# Patient Record
Sex: Male | Born: 1980 | Race: White | Hispanic: No | Smoking: Current every day smoker
Health system: Southern US, Community
[De-identification: ages and names within clinical notes are randomized; demographics above are authoritative.]

## PROBLEM LIST (undated history)

## (undated) DIAGNOSIS — S43439A Superior glenoid labrum lesion of unspecified shoulder, initial encounter: Secondary | ICD-10-CM

## (undated) DIAGNOSIS — B20 Human immunodeficiency virus [HIV] disease: Secondary | ICD-10-CM

## (undated) DIAGNOSIS — G709 Myoneural disorder, unspecified: Secondary | ICD-10-CM

## (undated) DIAGNOSIS — M797 Fibromyalgia: Secondary | ICD-10-CM

## (undated) DIAGNOSIS — F419 Anxiety disorder, unspecified: Secondary | ICD-10-CM

## (undated) DIAGNOSIS — I10 Essential (primary) hypertension: Secondary | ICD-10-CM

## (undated) DIAGNOSIS — K219 Gastro-esophageal reflux disease without esophagitis: Secondary | ICD-10-CM

## (undated) DIAGNOSIS — I82409 Acute embolism and thrombosis of unspecified deep veins of unspecified lower extremity: Secondary | ICD-10-CM

## (undated) DIAGNOSIS — Z21 Asymptomatic human immunodeficiency virus [HIV] infection status: Secondary | ICD-10-CM

## (undated) DIAGNOSIS — I2699 Other pulmonary embolism without acute cor pulmonale: Secondary | ICD-10-CM

## (undated) DIAGNOSIS — E7800 Pure hypercholesterolemia, unspecified: Secondary | ICD-10-CM

## (undated) DIAGNOSIS — K449 Diaphragmatic hernia without obstruction or gangrene: Secondary | ICD-10-CM

## (undated) DIAGNOSIS — F32A Depression, unspecified: Secondary | ICD-10-CM

## (undated) HISTORY — PX: UPPER GI ENDOSCOPY: SHX6162

## (undated) HISTORY — PX: COLONOSCOPY: SHX174

## (undated) HISTORY — DX: Asymptomatic human immunodeficiency virus (hiv) infection status: Z21

## (undated) HISTORY — DX: Fibromyalgia: M79.7

## (undated) HISTORY — DX: Human immunodeficiency virus (HIV) disease: B20

---

## 1998-08-30 ENCOUNTER — Emergency Department (HOSPITAL_COMMUNITY): Admission: EM | Admit: 1998-08-30 | Discharge: 1998-08-30 | Payer: Self-pay | Admitting: Emergency Medicine

## 2005-02-11 ENCOUNTER — Emergency Department (HOSPITAL_COMMUNITY): Admission: EM | Admit: 2005-02-11 | Discharge: 2005-02-12 | Payer: Self-pay | Admitting: Emergency Medicine

## 2005-02-18 ENCOUNTER — Emergency Department (HOSPITAL_COMMUNITY): Admission: EM | Admit: 2005-02-18 | Discharge: 2005-02-18 | Payer: Self-pay | Admitting: Emergency Medicine

## 2010-08-22 ENCOUNTER — Emergency Department: Payer: Self-pay | Admitting: Unknown Physician Specialty

## 2010-08-24 ENCOUNTER — Inpatient Hospital Stay: Payer: Self-pay | Admitting: Psychiatry

## 2017-01-16 ENCOUNTER — Emergency Department (HOSPITAL_COMMUNITY)
Admission: EM | Admit: 2017-01-16 | Discharge: 2017-01-16 | Disposition: A | Discharge status: Home / Self Care | Payer: Self-pay | Attending: Emergency Medicine | Admitting: Emergency Medicine

## 2017-01-16 ENCOUNTER — Encounter (HOSPITAL_COMMUNITY): Payer: Self-pay | Admitting: *Deleted

## 2017-01-16 DIAGNOSIS — R1010 Upper abdominal pain, unspecified: Secondary | ICD-10-CM | POA: Insufficient documentation

## 2017-01-16 HISTORY — DX: Pure hypercholesterolemia, unspecified: E78.00

## 2017-01-16 HISTORY — DX: Gastro-esophageal reflux disease without esophagitis: K21.9

## 2017-01-16 HISTORY — DX: Diaphragmatic hernia without obstruction or gangrene: K44.9

## 2017-01-16 LAB — URINALYSIS, ROUTINE W REFLEX MICROSCOPIC
Bilirubin Urine: NEGATIVE
GLUCOSE, UA: NEGATIVE mg/dL
HGB URINE DIPSTICK: NEGATIVE
KETONES UR: NEGATIVE mg/dL
LEUKOCYTES UA: NEGATIVE
Nitrite: NEGATIVE
PROTEIN: NEGATIVE mg/dL
Specific Gravity, Urine: 1.015 (ref 1.005–1.030)
pH: 8 (ref 5.0–8.0)

## 2017-01-16 LAB — CBC
HCT: 45.3 % (ref 39.0–52.0)
Hemoglobin: 15.5 g/dL (ref 13.0–17.0)
MCH: 29 pg (ref 26.0–34.0)
MCHC: 34.2 g/dL (ref 30.0–36.0)
MCV: 84.8 fL (ref 78.0–100.0)
PLATELETS: 262 10*3/uL (ref 150–400)
RBC: 5.34 MIL/uL (ref 4.22–5.81)
RDW: 12.8 % (ref 11.5–15.5)
WBC: 10 10*3/uL (ref 4.0–10.5)

## 2017-01-16 LAB — COMPREHENSIVE METABOLIC PANEL
ALK PHOS: 52 U/L (ref 38–126)
ALT: 47 U/L (ref 17–63)
AST: 39 U/L (ref 15–41)
Albumin: 4 g/dL (ref 3.5–5.0)
Anion gap: 9 (ref 5–15)
BILIRUBIN TOTAL: 0.8 mg/dL (ref 0.3–1.2)
BUN: 11 mg/dL (ref 6–20)
CALCIUM: 9.8 mg/dL (ref 8.9–10.3)
CO2: 27 mmol/L (ref 22–32)
CREATININE: 1.23 mg/dL (ref 0.61–1.24)
Chloride: 105 mmol/L (ref 101–111)
Glucose, Bld: 82 mg/dL (ref 65–99)
Potassium: 4.1 mmol/L (ref 3.5–5.1)
Sodium: 141 mmol/L (ref 135–145)
Total Protein: 6.7 g/dL (ref 6.5–8.1)

## 2017-01-16 LAB — LIPASE, BLOOD: Lipase: 28 U/L (ref 11–51)

## 2017-01-16 MED ORDER — OMEPRAZOLE 20 MG PO CPDR: 20.0000 mg | DELAYED_RELEASE_CAPSULE | Freq: Every day | ORAL | 1 refills | Status: DC

## 2017-01-16 MED ORDER — GI COCKTAIL ~~LOC~~
30.0000 mL | Freq: Once | ORAL | Status: AC
  Administered 2017-01-16: 30 mL via ORAL
  Filled 2017-01-16: qty 30

## 2017-01-16 NOTE — ED Provider Notes (Signed)
[facility]EMERGENCY DEPT Provider Note   CSN: [id number] Arrival date & time: 01/16/17  1824     History   Chief Complaint Chief Complaint  Patient presents with  . Abdominal Pain    HPI Jimmy Fisher is a 36 y.o. male.  36 year old male with a history of esophageal reflux and dyslipidemia presents to the emergency department for complaints of upper abdominal pain. He reports a constant pain over the last 5 days which he describes as a dull, aching pain, "as though I were punched in the stomach". Patient denies taking any medication for symptoms, though he has taken Rolaids frequently secondary to history of esophageal reflux. He denies any nausea, vomiting, fever, diarrhea, melena, hematochezia. He further denies shortness of breath, leg swelling, diaphoresis. He has been having regular bowel movements and passing flatus regularly. No history of abdominal surgeries. He expresses concern over the chronicity of his symptoms.   The history is provided by the patient. No language interpreter was used.  Abdominal Pain      Past Medical History:  Diagnosis Date  . GERD (gastroesophageal reflux disease)   . Hiatal hernia   . Hypercholesteremia     There are no active problems to display for this patient.   History reviewed. No pertinent surgical history.     Home Medications    Prior to Admission medications   Medication Sig Start Date End Date Taking? Authorizing Provider  omeprazole (PRILOSEC) 20 MG capsule Take 1 capsule (20 mg total) by mouth daily. 01/16/17   Antony MaduraHumes, Jonothan Heberle, PA-C    Family History No family history on file.  Social History Social History  Substance Use Topics  . Smoking status: Never Smoker  . Smokeless tobacco: Never Used  . Alcohol use No     Allergies   Patient has no known allergies.   Review of Systems Review of Systems  Gastrointestinal: Positive for abdominal pain.  Ten systems reviewed and are negative for acute change, except as  noted in the HPI.    Physical Exam Updated Vital Signs BP (!) 146/106 (BP Location: Right Arm)   Pulse 85   Temp 98.1 F (36.7 C) (Oral)   Resp 14   SpO2 97%   Physical Exam  Constitutional: He is oriented to person, place, and time. He appears well-developed and well-nourished. No distress.  Nontoxic appearing and in no acute distress  HENT:  Head: Normocephalic and atraumatic.  Eyes: Conjunctivae and EOM are normal. No scleral icterus.  Neck: Normal range of motion.  Pulmonary/Chest: Effort normal. No respiratory distress. He has no wheezes.  Respirations even and unlabored  Abdominal:  Mild bilateral upper quadrant tenderness. Negative Murphy sign. No peritoneal signs or rigidity. No guarding.  Musculoskeletal: Normal range of motion.  Neurological: He is alert and oriented to person, place, and time. He exhibits normal muscle tone. Coordination normal.  Skin: Skin is warm and dry. No rash noted. He is not diaphoretic. No erythema. No pallor.  Psychiatric: He has a normal mood and affect. His behavior is normal.  Nursing note and vitals reviewed.    ED Treatments / Results  Labs (all labs ordered are listed, but only abnormal results are displayed) Labs Reviewed  URINALYSIS, ROUTINE W REFLEX MICROSCOPIC - Abnormal; Notable for the following:       Result Value   APPearance CLOUDY (*)    All other components within normal limits  LIPASE, BLOOD  COMPREHENSIVE METABOLIC PANEL  CBC    EKG  EKG Interpretation  Date/Time:  Sunday January 16 2017 19:01:46 EDT Ventricular Rate:  73 PR Interval:  174 QRS Duration: 104 QT Interval:  394 QTC Calculation: 434 R Axis:   -7 Text Interpretation:  Normal sinus rhythm Normal ECG normal EKG no priors  Confirmed by Liu, Dana ([id number]) on 01/16/2017 9:21:52 PM       Radiology No results found.  Procedures Procedures (including critical care time)  Medications Ordered in ED Medications  gi cocktail  (Maalox,Lidocaine,Donnatal) (30 mLs Oral Given 01/16/17 2107)     Initial Impression / Assessment and Plan / ED Course  I have reviewed the triage vital signs and the nursing notes.  Pertinent labs & imaging results that were available during my care of the patient were reviewed by me and considered in my medical decision making (see chart for details).     36  year old male presents for 5 days of upper abdominal discomfort without associated symptoms. He does have some reproducible tenderness on exam, but no peritoneal signs. His laboratory workup is reassuring. Patient is afebrile and without leukocytosis. He denies aggravation of his symptoms with eating, though he does have history of esophageal reflux and question underlying indigestion versus gastritis. Doubt SBO or pSBO given lack of nausea, vomiting in the presence of regular bowel movements and passing of flatus. No focal tenderness in the right upper quadrant. Negative Murphy sign. Also doubt acute cholecystitis. Symptoms atypical for bowel perforation and chronicity also makes this unlikely.  Low suspicion for emergent cause of symptoms. Plan for continued outpatient follow-up with gastroenterology. Referral given to [facility]. Resource guide also given for primary care services. Return precautions provided at discharge. Patient agreeable to plan with no unaddressed concerns; discharged in satisfactory condition.   Final Clinical Impressions(s) / ED Diagnoses   Final diagnoses:  Pain of upper abdomen    New Prescriptions Discharge Medication List as of 01/16/2017  9:25 PM    START taking these medications   Details  omeprazole (PRILOSEC) 20 MG capsule Take 1 capsule (20 mg total) by mouth daily., Starting Sun 01/16/2017, Print         Antony MaduraHumes, Rayner Erman, PA-C 01/16/17 2243    Lavera GuiseLiu, Dana Duo, MD 01/16/17 [phone]

## 2017-01-16 NOTE — ED Provider Notes (Signed)
Medical screening examination/treatment/procedure(s) were conducted as a shared visit with non-physician practitioner(s) and myself.  I personally evaluated the patient during the encounter.   EKG Interpretation  Date/Time:  Sunday January 16 2017 19:01:46 EDT Ventricular Rate:  73 PR Interval:  174 QRS Duration: 104 QT Interval:  394 QTC Calculation: 434 R Axis:   -7 Text Interpretation:  Normal sinus rhythm Normal ECG normal EKG no priors  Confirmed by Arvella Massingale ([id number]) on 01/16/2017 9:21:52 PM      36  year old male who presents with epigastric pain and abdominal fullness. History of GERD, hiatal hernia. States 5 days constant epigastric discomfort. No n/v, melena, hematochezia. No associated with eating. No fever or chills. No urinary complaints.   Well appearing. Vitals stable. Abdomen soft and benign. Mild epigastric discomfort. No Murphy's sign. Normal lipase, CBC, CMP. UA unremarkable. Do not suspect serious intraabdominal process. May be indigestion/gastritis. Do not think imaging indicated at this time. Referral to GI given. Strict return and follow-up instructions reviewed. He expressed understanding of all discharge instructions and felt comfortable with the plan of care.    Lavera GuiseLiu, Zanyia Silbaugh Duo, MD 01/16/17 [phone]

## 2017-01-16 NOTE — ED Triage Notes (Signed)
To ED for eval of mid-epigastric for past 5 days. No radiation. No vomiting. Pt has hx of GERD but states this pain is different.

## 2017-01-19 ENCOUNTER — Emergency Department (HOSPITAL_COMMUNITY)
Admission: EM | Admit: 2017-01-19 | Discharge: 2017-01-19 | Disposition: A | Discharge status: Home / Self Care | Payer: Self-pay | Attending: Emergency Medicine | Admitting: Emergency Medicine

## 2017-01-19 ENCOUNTER — Encounter (HOSPITAL_COMMUNITY): Payer: Self-pay | Admitting: *Deleted

## 2017-01-19 DIAGNOSIS — K279 Peptic ulcer, site unspecified, unspecified as acute or chronic, without hemorrhage or perforation: Secondary | ICD-10-CM

## 2017-01-19 LAB — COMPREHENSIVE METABOLIC PANEL
ALT: 52 U/L (ref 17–63)
AST: 41 U/L (ref 15–41)
Albumin: 3.6 g/dL (ref 3.5–5.0)
Alkaline Phosphatase: 54 U/L (ref 38–126)
Anion gap: 10 (ref 5–15)
BILIRUBIN TOTAL: 0.9 mg/dL (ref 0.3–1.2)
BUN: 7 mg/dL (ref 6–20)
CHLORIDE: 106 mmol/L (ref 101–111)
CO2: 22 mmol/L (ref 22–32)
CREATININE: 1.02 mg/dL (ref 0.61–1.24)
Calcium: 8.7 mg/dL — ABNORMAL LOW (ref 8.9–10.3)
Glucose, Bld: 136 mg/dL — ABNORMAL HIGH (ref 65–99)
POTASSIUM: 3.8 mmol/L (ref 3.5–5.1)
Sodium: 138 mmol/L (ref 135–145)
TOTAL PROTEIN: 6.5 g/dL (ref 6.5–8.1)

## 2017-01-19 LAB — CBC
HCT: 41.9 % (ref 39.0–52.0)
Hemoglobin: 14.5 g/dL (ref 13.0–17.0)
MCH: 29.4 pg (ref 26.0–34.0)
MCHC: 34.6 g/dL (ref 30.0–36.0)
MCV: 84.8 fL (ref 78.0–100.0)
PLATELETS: 244 10*3/uL (ref 150–400)
RBC: 4.94 MIL/uL (ref 4.22–5.81)
RDW: 12.8 % (ref 11.5–15.5)
WBC: 9.4 10*3/uL (ref 4.0–10.5)

## 2017-01-19 LAB — URINALYSIS, ROUTINE W REFLEX MICROSCOPIC
BILIRUBIN URINE: NEGATIVE
Bacteria, UA: NONE SEEN
GLUCOSE, UA: 150 mg/dL — AB
KETONES UR: NEGATIVE mg/dL
LEUKOCYTES UA: NEGATIVE
NITRITE: NEGATIVE
PH: 5 (ref 5.0–8.0)
Protein, ur: NEGATIVE mg/dL
SPECIFIC GRAVITY, URINE: 1.025 (ref 1.005–1.030)

## 2017-01-19 LAB — LIPASE, BLOOD: LIPASE: 28 U/L (ref 11–51)

## 2017-01-19 MED ORDER — HYDROCODONE-ACETAMINOPHEN 5-325 MG PO TABS
1.0000 | ORAL_TABLET | ORAL | 0 refills | Status: DC | PRN
Start: 2017-01-19 — End: 2019-01-13

## 2017-01-19 MED ORDER — SUCRALFATE 1 G PO TABS: 1.0000 g | ORAL_TABLET | Freq: Four times a day (QID) | ORAL | 0 refills | Status: DC

## 2017-01-19 MED ORDER — OMEPRAZOLE 20 MG PO CPDR: 40.0000 mg | DELAYED_RELEASE_CAPSULE | Freq: Two times a day (BID) | ORAL | 0 refills | Status: DC

## 2017-01-19 MED ORDER — SUCRALFATE 1 G PO TABS
1.0000 g | ORAL_TABLET | Freq: Once | ORAL | Status: AC
  Administered 2017-01-19: 1 g via ORAL
  Filled 2017-01-19: qty 1

## 2017-01-19 MED ORDER — PANTOPRAZOLE SODIUM 40 MG PO TBEC
40.0000 mg | DELAYED_RELEASE_TABLET | Freq: Once | ORAL | Status: AC
  Administered 2017-01-19: 40 mg via ORAL
  Filled 2017-01-19: qty 1

## 2017-01-19 NOTE — ED Provider Notes (Signed)
Pt seen and evaluated. Patient seen and discussed with Dr. Alinda MoneyMelvin.  Patient with fairly classic symptoms likely related to acid related phenomenon with ulcer or gastritis. He has early satiety, nocturnal pain, and pain after by mouth intake. Use ibuprofen 800 up to several days per week but not daily. No alcohol or tobacco. A boxer steroids. Daily caffeine. Cautioned against all of these. No stigmata of bleeding. Had emesis within the last 24 hours and did not see blood. He is taking Prilosec but only 20 daily. Labs are reassuring and symptoms and exam did not suggest biliary tract etiology. Plan 40 twice a day Prilosec. Carafate 1 g 3 times a day. Vicodin sparingly for the next several days this is still expressing regular pain. Liquid antacid at night. GI follow-up. Have asked case manager see him regarding possible [facility].   Rolland PorterJames, Bailynn Dyk, MD 01/19/17 1005

## 2017-01-19 NOTE — ED Notes (Signed)
Family at bedside. 

## 2017-01-19 NOTE — Discharge Instructions (Signed)
Thank you for allowing us to care for you.  Please increase you dose of Omeprazole to 40mg  Twice a day with meals. We have added: - Sucralfate 1 gram Twice daily, on empty stomach. - Pain medication to take as needed for 3 days  For nighttime reflux symptoms, please use a liquid OTC antacid.  For Peptic ulcers, seek medication attention if: You have sudden, sharp pain in your belly (abdomen). You have lasting belly pain. You have bloody poop (stool) or black, tarry poop. You throw up (vomit) blood. It may look like coffee grounds. You feel light-headed or feel like you may pass out (faint). You get weak. You get sweaty or feel sticky and cold to the touch (clammy).

## 2017-01-19 NOTE — ED Provider Notes (Signed)
[facility] DEPT Provider Note   CSN: [id number] Arrival date & time: 01/19/17  0106     History   Chief Complaint Chief Complaint  Patient presents with  . Abdominal Pain    HPI Jimmy Fisher is a 36 y.o. male.  Patient with a history of GERD, Hiatal hernia, HLD who was recently seen in the ED on 01/16/17 with bilat upper quadrant abdominal pain discharged with Rx for PPI present today with worsening of his previous epigastric pain. The pain has been as bad a 8/10, waxes and wanes, but is constant and radiates from his epigastrum down his midline 6 or so inches at times. The pain worsens 30-45 minutes following eating. It is also worsened by movement, deep inspiration, cough and leaning forward. The pain has been associated with feelings of early satiety and "spasm" in his epigastrium with some burping. Patient states he takes OTC antiacids constantly for his chronic heartburn. Patient endorse 1 episode of vomiting. He denies blood or brown color in his vomit and denies bloody or tarry stool.  Of note, patient states that he has a history of kidney stone and this feels completely different.        Past Medical History:  Diagnosis Date  . GERD (gastroesophageal reflux disease)   . Hiatal hernia   . Hypercholesteremia     There are no active problems to display for this patient.   History reviewed. No pertinent surgical history.     Home Medications    Prior to Admission medications   Medication Sig Start Date End Date Taking? Authorizing Provider  HYDROcodone-acetaminophen (NORCO/VICODIN) 5-325 MG tablet Take 1 tablet by mouth every 4 (four) hours as needed. 01/19/17   Rolland PorterJames, Mark, MD  omeprazole (PRILOSEC) 20 MG capsule Take 2 capsules (40 mg total) by mouth 2 (two) times daily. 01/19/17   Rolland PorterJames, Mark, MD  sucralfate (CARAFATE) 1 g tablet Take 1 tablet (1 g total) by mouth 4 (four) times daily. 01/19/17   Rolland PorterJames, Mark, MD    Family History No family history on  file.  Social History Social History  Substance Use Topics  . Smoking status: Never Smoker  . Smokeless tobacco: Never Used  . Alcohol use No     Allergies   Patient has no known allergies.   Review of Systems Review of Systems  Constitutional: Negative for chills and fever.  HENT: Negative for ear pain and sore throat.   Eyes: Negative for pain and visual disturbance.  Respiratory: Negative for cough and shortness of breath.   Cardiovascular: Negative for chest pain and palpitations.  Gastrointestinal: Negative for abdominal pain and vomiting.  Genitourinary: Negative for dysuria and hematuria.  Musculoskeletal: Negative for arthralgias and back pain.  Skin: Negative for color change and rash.  Neurological: Negative for seizures and syncope.  All other systems reviewed and are negative.    Physical Exam Updated Vital Signs BP (!) 131/97   Pulse 66   Temp 98.5 F (36.9 C) (Oral)   Resp 12   SpO2 98%   Physical Exam  Constitutional: He is oriented to person, place, and time. He appears well-developed and well-nourished.  HENT:  Head: Normocephalic and atraumatic.  Eyes: Conjunctivae are normal.  Neck: Neck supple.  Cardiovascular: Normal rate and regular rhythm.   No murmur heard. Pulmonary/Chest: Effort normal and breath sounds normal. No respiratory distress.  Abdominal: Soft. There is tenderness.  Tender to palpation at epigastrum Negative murphy's sign  Musculoskeletal: He exhibits no edema  or deformity.  Neurological: He is alert and oriented to person, place, and time.  Skin: Skin is warm and dry.  Psychiatric: He has a normal mood and affect.  Nursing note and vitals reviewed.    ED Treatments / Results  Labs (all labs ordered are listed, but only abnormal results are displayed) Labs Reviewed  COMPREHENSIVE METABOLIC PANEL - Abnormal; Notable for the following:       Result Value   Glucose, Bld 136 (*)    Calcium 8.7 (*)    All other  components within normal limits  URINALYSIS, ROUTINE W REFLEX MICROSCOPIC - Abnormal; Notable for the following:    Glucose, UA 150 (*)    Hgb urine dipstick LARGE (*)    Squamous Epithelial / LPF 0-5 (*)    All other components within normal limits  LIPASE, BLOOD  CBC    EKG  EKG Interpretation None       Radiology No results found.  Procedures Procedures (including critical care time)  Medications Ordered in ED Medications  pantoprazole (PROTONIX) EC tablet 40 mg (40 mg Oral Given 01/19/17 1040)  sucralfate (CARAFATE) tablet 1 g (1 g Oral Given 01/19/17 1040)     Initial Impression / Assessment and Plan / ED Course  I have reviewed the triage vital signs and the nursing notes.  Pertinent labs & imaging results that were available during my care of the patient were reviewed by me and considered in my medical decision making (see chart for details).    36 year old male presenting with increasing epigastric pain. Recently discharge from ED on 01/16/17 with suspected GERD on PPI with GI follow up. - CBC: WNL - BMP: Ca 8.7, Glucose 136; No significant derangements - Lipase: WNL - U/A: shows too numerous to count RBC.  Worsening epigastric pain on low dose PPI likely represents gastric ulcer in the setting of typical symptoms and laboratory results reassuring that the cause is ulikely to be of pancreatic or gall bladder origin. Blood on U/A may represent transient ureteral obstruction by known kidney stones.  - Will give dose of PPI, and Carafate here - Will discharge with Rx for Omeprazole 40mg  BID, Sucralfate 1g BID on empty stomach, and Pain medication  - Have advised patient to take OTC liquid antiacids at night PRN  Final Clinical Impressions(s) / ED Diagnoses   Final diagnoses:  Peptic ulcer    New Prescriptions Discharge Medication List as of 01/19/2017 10:32 AM    START taking these medications   Details  HYDROcodone-acetaminophen (NORCO/VICODIN) 5-325 MG tablet  Take 1 tablet by mouth every 4 (four) hours as needed., Starting Wed 01/19/2017, Print    sucralfate (CARAFATE) 1 g tablet Take 1 tablet (1 g total) by mouth 4 (four) times daily., Starting Wed 01/19/2017, Print         Beola Cord, MD 01/19/17 1113    Rolland Porter, MD 01/20/17 [phone]

## 2017-01-19 NOTE — Discharge Planning (Signed)
Joshlynn Alfonzo J. Lucretia RoersWood, RN, BSN, [place] [phone]  [facility] Sindy Messingoger Gomez[facility] @ [phone].  Spoke with pt at bedside and advised to please arrive 15 min early and take a picture ID and your current medications.  Pt verbalizes understanding of keeping appointment.

## 2017-01-19 NOTE — ED Triage Notes (Signed)
Pt c/o epigastric pain x1 week. Was seen on Sunday and referred to a GI specialist. Has been taking antiacids without relief. Reports pain feels "like someone punching me in the stomach"

## 2017-02-16 ENCOUNTER — Inpatient Hospital Stay (INDEPENDENT_AMBULATORY_CARE_PROVIDER_SITE_OTHER): Payer: Self-pay | Admitting: Physician Assistant

## 2018-03-14 ENCOUNTER — Emergency Department (HOSPITAL_BASED_OUTPATIENT_CLINIC_OR_DEPARTMENT_OTHER): Payer: Self-pay

## 2018-03-14 ENCOUNTER — Emergency Department (HOSPITAL_BASED_OUTPATIENT_CLINIC_OR_DEPARTMENT_OTHER)
Admission: EM | Admit: 2018-03-14 | Discharge: 2018-03-14 | Disposition: A | Discharge status: Home / Self Care | Payer: Self-pay | Attending: Emergency Medicine | Admitting: Emergency Medicine

## 2018-03-14 ENCOUNTER — Encounter (HOSPITAL_BASED_OUTPATIENT_CLINIC_OR_DEPARTMENT_OTHER): Payer: Self-pay

## 2018-03-14 ENCOUNTER — Other Ambulatory Visit: Payer: Self-pay

## 2018-03-14 DIAGNOSIS — S63642A Sprain of metacarpophalangeal joint of left thumb, initial encounter: Secondary | ICD-10-CM | POA: Insufficient documentation

## 2018-03-14 DIAGNOSIS — Y929 Unspecified place or not applicable: Secondary | ICD-10-CM | POA: Insufficient documentation

## 2018-03-14 DIAGNOSIS — Z79899 Other long term (current) drug therapy: Secondary | ICD-10-CM | POA: Insufficient documentation

## 2018-03-14 DIAGNOSIS — Y93H3 Activity, building and construction: Secondary | ICD-10-CM | POA: Insufficient documentation

## 2018-03-14 DIAGNOSIS — Y990 Civilian activity done for income or pay: Secondary | ICD-10-CM | POA: Insufficient documentation

## 2018-03-14 DIAGNOSIS — W228XXA Striking against or struck by other objects, initial encounter: Secondary | ICD-10-CM | POA: Insufficient documentation

## 2018-03-14 MED ORDER — IBUPROFEN 600 MG PO TABS: 600.0000 mg | ORAL_TABLET | Freq: Four times a day (QID) | ORAL | 0 refills | Status: DC | PRN

## 2018-03-14 MED ORDER — IBUPROFEN 800 MG PO TABS
800.0000 mg | ORAL_TABLET | Freq: Once | ORAL | Status: AC
  Administered 2018-03-14: 800 mg via ORAL
  Filled 2018-03-14: qty 1

## 2018-03-14 MED FILL — IBUPROFEN 600 MG TABLET: 600 | 7 days supply | Qty: 30 | Fill #0

## 2018-03-14 NOTE — ED Provider Notes (Signed)
[facility] EMERGENCY DEPARTMENT Provider Note   CSN: [id number] Arrival date & time: 03/14/18  1304     History   Chief Complaint Chief Complaint  Patient presents with  . Finger Injury    HPI Jimmy Fisher is a 37 y.o. male.  The history is provided by the patient. No language interpreter was used.     37 year old male presenting for evaluation of left thumb injury.  Patient report while at work yesterday he was working on some scaffolding when a board fell and made contact with his left thumb.  He report acute onset of sharp pain to the base of the thumb follows with numbness.  Numbness has since resolved however he is experiencing persistent throbbing 8 out of 10 pain to the base of his thumb.  He endorsed increasing pain with movement.  He is right-hand dominant.  He denies any other injury.  He did try icing the affected area.  Past Medical History:  Diagnosis Date  . GERD (gastroesophageal reflux disease)   . Hiatal hernia   . Hypercholesteremia     There are no active problems to display for this patient.   History reviewed. No pertinent surgical history.      Home Medications    Prior to Admission medications   Medication Sig Start Date End Date Taking? Authorizing Provider  HYDROcodone-acetaminophen (NORCO/VICODIN) 5-325 MG tablet Take 1 tablet by mouth every 4 (four) hours as needed. 01/19/17   Rolland Porter, MD  omeprazole (PRILOSEC) 20 MG capsule Take 2 capsules (40 mg total) by mouth 2 (two) times daily. 01/19/17   Rolland Porter, MD  sucralfate (CARAFATE) 1 g tablet Take 1 tablet (1 g total) by mouth 4 (four) times daily. 01/19/17   Rolland Porter, MD    Family History No family history on file.  Social History Social History   Tobacco Use  . Smoking status: Never Smoker  . Smokeless tobacco: Never Used  Substance Use Topics  . Alcohol use: No  . Drug use: No     Allergies   Patient has no known allergies.   Review of Systems Review  of Systems  Constitutional: Negative for fever.  Musculoskeletal: Positive for arthralgias.  Skin: Negative for wound.  Neurological: Negative for numbness.     Physical Exam Updated Vital Signs BP 133/76 (BP Location: Right Arm)   Pulse 74   Temp 98.4 F (36.9 C) (Oral)   Resp 18   Ht 5\' 6"  (1.676 m)   Wt 87.1 kg   SpO2 98%   BMI 30.99 kg/m   Physical Exam  Constitutional: He appears well-developed and well-nourished. No distress.  HENT:  Head: Atraumatic.  Eyes: Conjunctivae are normal.  Neck: Neck supple.  Musculoskeletal: He exhibits tenderness (Left thumb: Moderate tenderness and swelling noted to the base of the thumb at the metacarpal region as well as there anatomical snuffbox.  No obvious deformity no crepitus.  Brisk cap refill to the thumb, normal flexion extension at the IP joint.).  Neurological: He is alert.  Skin: No rash noted.  Psychiatric: He has a normal mood and affect.  Nursing note and vitals reviewed.    ED Treatments / Results  Labs (all labs ordered are listed, but only abnormal results are displayed) Labs Reviewed - No data to display  EKG None  Radiology Dg Finger Thumb Left  Result Date: 03/14/2018 CLINICAL DATA:  Crush injury last week.  Thumb pain EXAM: LEFT THUMB 2+V COMPARISON:  None. FINDINGS:  There is no evidence of fracture or dislocation. There is no evidence of arthropathy or other focal bone abnormality. Soft tissues are unremarkable IMPRESSION: Negative. Electronically Signed   By: Marlan Palau M.D.   On: 03/14/2018 13:30    Procedures Procedures (including critical care time)  Medications Ordered in ED Medications  ibuprofen (ADVIL,MOTRIN) tablet 800 mg (has no administration in time range)     Initial Impression / Assessment and Plan / ED Course  I have reviewed the triage vital signs and the nursing notes.  Pertinent labs & imaging results that were available during my care of the patient were reviewed by me and  considered in my medical decision making (see chart for details).     BP 133/76 (BP Location: Right Arm)   Pulse 74   Temp 98.4 F (36.9 C) (Oral)   Resp 18   Ht 5\' 6"  (1.676 m)   Wt 87.1 kg   SpO2 98%   BMI 30.99 kg/m    Final Clinical Impressions(s) / ED Diagnoses   Final diagnoses:  Sprain of metacarpophalangeal (MCP) joint of left thumb, initial encounter    ED Discharge Orders         Ordered    ibuprofen (ADVIL,MOTRIN) 600 MG tablet  Every 6 hours PRN     03/14/18 1339         1:37 PM Pt report a scaffold board fell and made contact with his L thumb yesterday.  He has pain at the base of the thumb, including the anatomical snuff box.  Initial xray is negative.  However, due to potential scaphoid injury or significant ligamentous injury, will place hand in a thumb spica splint and encourage pt to f/u with hand specialist for further care.     Fayrene Helper, PA-C 03/14/18 1340    Pricilla Loveless, MD 03/14/18 1515

## 2018-03-14 NOTE — ED Triage Notes (Signed)
Pt injured left thumb yesterday-NAD-steady gait

## 2018-03-14 NOTE — Discharge Instructions (Signed)
Please wear splint for support.  Call and follow up with hand specialist if you notice no improvement in 1 week.

## 2018-07-17 ENCOUNTER — Other Ambulatory Visit: Payer: Self-pay

## 2018-07-17 ENCOUNTER — Emergency Department (HOSPITAL_BASED_OUTPATIENT_CLINIC_OR_DEPARTMENT_OTHER): Payer: Self-pay

## 2018-07-17 ENCOUNTER — Encounter (HOSPITAL_BASED_OUTPATIENT_CLINIC_OR_DEPARTMENT_OTHER): Payer: Self-pay

## 2018-07-17 ENCOUNTER — Emergency Department (HOSPITAL_BASED_OUTPATIENT_CLINIC_OR_DEPARTMENT_OTHER)
Admission: EM | Admit: 2018-07-17 | Discharge: 2018-07-17 | Disposition: A | Discharge status: Home / Self Care | Payer: Self-pay | Attending: Emergency Medicine | Admitting: Emergency Medicine

## 2018-07-17 DIAGNOSIS — N200 Calculus of kidney: Secondary | ICD-10-CM | POA: Insufficient documentation

## 2018-07-17 DIAGNOSIS — Z79899 Other long term (current) drug therapy: Secondary | ICD-10-CM | POA: Insufficient documentation

## 2018-07-17 HISTORY — DX: Acute embolism and thrombosis of unspecified deep veins of unspecified lower extremity: I82.409

## 2018-07-17 HISTORY — DX: Other pulmonary embolism without acute cor pulmonale: I26.99

## 2018-07-17 LAB — CBC WITH DIFFERENTIAL/PLATELET
Abs Immature Granulocytes: 0.03 10*3/uL (ref 0.00–0.07)
Basophils Absolute: 0.1 10*3/uL (ref 0.0–0.1)
Basophils Relative: 1 %
EOS PCT: 5 %
Eosinophils Absolute: 0.4 10*3/uL (ref 0.0–0.5)
HCT: 46.9 % (ref 39.0–52.0)
Hemoglobin: 15.3 g/dL (ref 13.0–17.0)
Immature Granulocytes: 0 %
Lymphocytes Relative: 35 %
Lymphs Abs: 3 10*3/uL (ref 0.7–4.0)
MCH: 28.5 pg (ref 26.0–34.0)
MCHC: 32.6 g/dL (ref 30.0–36.0)
MCV: 87.3 fL (ref 80.0–100.0)
MONOS PCT: 9 %
Monocytes Absolute: 0.8 10*3/uL (ref 0.1–1.0)
Neutro Abs: 4.4 10*3/uL (ref 1.7–7.7)
Neutrophils Relative %: 50 %
Platelets: 242 10*3/uL (ref 150–400)
RBC: 5.37 MIL/uL (ref 4.22–5.81)
RDW: 12.6 % (ref 11.5–15.5)
WBC: 8.7 10*3/uL (ref 4.0–10.5)
nRBC: 0 % (ref 0.0–0.2)

## 2018-07-17 LAB — URINALYSIS, ROUTINE W REFLEX MICROSCOPIC
BILIRUBIN URINE: NEGATIVE
Glucose, UA: NEGATIVE mg/dL
Hgb urine dipstick: NEGATIVE
Ketones, ur: NEGATIVE mg/dL
LEUKOCYTES UA: NEGATIVE
NITRITE: NEGATIVE
Protein, ur: NEGATIVE mg/dL
Specific Gravity, Urine: 1.02 (ref 1.005–1.030)
pH: 7 (ref 5.0–8.0)

## 2018-07-17 LAB — BASIC METABOLIC PANEL
Anion gap: 8 (ref 5–15)
BUN: 15 mg/dL (ref 6–20)
CO2: 26 mmol/L (ref 22–32)
Calcium: 9.7 mg/dL (ref 8.9–10.3)
Chloride: 104 mmol/L (ref 98–111)
Creatinine, Ser: 1 mg/dL (ref 0.61–1.24)
GFR calc Af Amer: >60 mL/min (ref >60)
Glucose, Bld: 88 mg/dL (ref 70–99)
Potassium: 4 mmol/L (ref 3.5–5.1)
Sodium: 138 mmol/L (ref 135–145)

## 2018-07-17 MED ORDER — NAPROXEN 500 MG PO TABS: 500.0000 mg | ORAL_TABLET | Freq: Two times a day (BID) | ORAL | 0 refills | Status: DC

## 2018-07-17 MED ORDER — FENTANYL CITRATE (PF) 100 MCG/2ML IJ SOLN
50.0000 ug | Freq: Once | INTRAMUSCULAR | Status: AC
  Administered 2018-07-17: 50 ug via INTRAVENOUS
  Filled 2018-07-17: qty 2

## 2018-07-17 MED ORDER — KETOROLAC TROMETHAMINE 30 MG/ML IJ SOLN
30.0000 mg | Freq: Once | INTRAMUSCULAR | Status: AC
  Administered 2018-07-17: 30 mg via INTRAVENOUS
  Filled 2018-07-17: qty 1

## 2018-07-17 MED ORDER — METHOCARBAMOL 500 MG PO TABS: 500.0000 mg | ORAL_TABLET | Freq: Two times a day (BID) | ORAL | 0 refills | Status: DC

## 2018-07-17 MED ORDER — SODIUM CHLORIDE 0.9 % IV BOLUS
1000.0000 mL | Freq: Once | INTRAVENOUS | Status: AC
  Administered 2018-07-17: 1000 mL via INTRAVENOUS

## 2018-07-17 NOTE — Discharge Instructions (Addendum)
Return to the ED if you start to develop worsening symptoms, fever, pain with urination, vomiting or coughing up blood.

## 2018-07-17 NOTE — ED Triage Notes (Signed)
Pt c/o left side flank pain for the last few hours, states it feels similar to other kidney stones, denies nausea, denies dysuria

## 2018-07-17 NOTE — ED Provider Notes (Signed)
[facility] EMERGENCY DEPARTMENT Provider Note   CSN: [id number] Arrival date & time: 07/17/18  1736     History   Chief Complaint Chief Complaint  Patient presents with  . Flank Pain    HPI Jimmy Fisher is a 38 y.o. male who presents to ED for left-sided flank pain for the past 4 hours.  States that the pain is sharp, intermittent and feels like "a spasm."  He reports history of similar symptoms with his prior kidney stones but does note that each kidney stone has somewhat different characteristics of pain.  His last kidney stone was several years ago.  He had an episode about a month ago where he had dysuria for which she took leftover antibiotics and Azo for.  This resolved his symptoms.  States his prior kidney stones have passed without complications.  He does not have a urologist.  Denies any fever, abdominal pain, vomiting, changes to bowel movements, testicular pain.  HPI  Past Medical History:  Diagnosis Date  . DVT (deep venous thrombosis) (HCC)   . GERD (gastroesophageal reflux disease)   . Hiatal hernia   . Hypercholesteremia   . Pulmonary embolism (HCC)     There are no active problems to display for this patient.   History reviewed. No pertinent surgical history.      Home Medications    Prior to Admission medications   Medication Sig Start Date End Date Taking? Authorizing Provider  HYDROcodone-acetaminophen (NORCO/VICODIN) 5-325 MG tablet Take 1 tablet by mouth every 4 (four) hours as needed. 01/19/17   Rolland PorterJames, Mark, MD  ibuprofen (ADVIL,MOTRIN) 600 MG tablet Take 1 tablet (600 mg total) by mouth every 6 (six) hours as needed. 03/14/18   Fayrene Helperran, Bowie, PA-C  methocarbamol (ROBAXIN) 500 MG tablet Take 1 tablet (500 mg total) by mouth 2 (two) times daily. 07/17/18   Helyn Schwan, PA-C  naproxen (NAPROSYN) 500 MG tablet Take 1 tablet (500 mg total) by mouth 2 (two) times daily. 07/17/18   Klynn Linnemann, PA-C  omeprazole (PRILOSEC) 20 MG capsule Take 2  capsules (40 mg total) by mouth 2 (two) times daily. 01/19/17   Rolland PorterJames, Mark, MD  sucralfate (CARAFATE) 1 g tablet Take 1 tablet (1 g total) by mouth 4 (four) times daily. 01/19/17   Rolland PorterJames, Mark, MD    Family History No family history on file.  Social History Social History   Tobacco Use  . Smoking status: Never Smoker  . Smokeless tobacco: Never Used  Substance Use Topics  . Alcohol use: No  . Drug use: No     Allergies   Patient has no known allergies.   Review of Systems Review of Systems  Constitutional: Negative for appetite change, chills and fever.  HENT: Negative for ear pain, rhinorrhea, sneezing and sore throat.   Eyes: Negative for photophobia and visual disturbance.  Respiratory: Negative for cough, chest tightness, shortness of breath and wheezing.   Cardiovascular: Negative for chest pain and palpitations.  Gastrointestinal: Negative for abdominal pain, blood in stool, constipation, diarrhea, nausea and vomiting.  Genitourinary: Positive for flank pain. Negative for dysuria, hematuria and urgency.  Musculoskeletal: Negative for myalgias.  Skin: Negative for rash.  Neurological: Negative for dizziness, weakness and light-headedness.     Physical Exam Updated Vital Signs BP (!) 130/100 (BP Location: Left Arm)   Pulse 92   Temp 97.7 F (36.5 C) (Oral)   Resp 18   Ht 5\' 6"  (1.676 m)   Wt 86.2 kg  SpO2 98%   BMI 30.67 kg/m   Physical Exam Vitals signs and nursing note reviewed.  Constitutional:      General: He is not in acute distress.    Appearance: He is well-developed.  HENT:     Head: Normocephalic and atraumatic.     Nose: Nose normal.  Eyes:     General: No scleral icterus.       Left eye: No discharge.     Conjunctiva/sclera: Conjunctivae normal.  Neck:     Musculoskeletal: Normal range of motion and neck supple.  Cardiovascular:     Rate and Rhythm: Normal rate and regular rhythm.     Heart sounds: Normal heart sounds. No murmur. No  friction rub. No gallop.   Pulmonary:     Effort: Pulmonary effort is normal. No respiratory distress.     Breath sounds: Normal breath sounds.  Abdominal:     General: Bowel sounds are normal. There is no distension.     Palpations: Abdomen is soft.     Tenderness: There is abdominal tenderness (L flank). There is no guarding.  Musculoskeletal: Normal range of motion.  Skin:    General: Skin is warm and dry.     Findings: No rash.  Neurological:     Mental Status: He is alert.     Motor: No abnormal muscle tone.     Coordination: Coordination normal.      ED Treatments / Results  Labs (all labs ordered are listed, but only abnormal results are displayed) Labs Reviewed  URINALYSIS, ROUTINE W REFLEX MICROSCOPIC  BASIC METABOLIC PANEL  CBC WITH DIFFERENTIAL/PLATELET    EKG None  Radiology Ct Renal Stone Study  Result Date: 07/17/2018 CLINICAL DATA:  Left flank pain for 2 hours. History of kidney stones. EXAM: CT ABDOMEN AND PELVIS WITHOUT CONTRAST TECHNIQUE: Multidetector CT imaging of the abdomen and pelvis was performed following the standard protocol without IV contrast. COMPARISON:  02/11/2005 FINDINGS: Lower chest: Lung bases are clear. Hepatobiliary: No focal liver abnormality is seen. No gallstones, gallbladder wall thickening, or biliary dilatation. Pancreas: Unremarkable. No pancreatic ductal dilatation or surrounding inflammatory changes. Spleen: Normal in size without focal abnormality. Adrenals/Urinary Tract: No adrenal gland nodules. Kidneys are symmetrical in size and shape. No hydronephrosis or hydroureter. Tiny punctate sub mm sized stones suggested in both kidneys. No ureteral or bladder stones. Bladder wall is mildly thickened, possibly indicating cystitis. Stomach/Bowel: Stomach, small bowel, and colon are mostly decompressed. No wall thickening or inflammatory changes are seen. Scattered stool throughout the colon. Appendix is normal. Vascular/Lymphatic: No  significant vascular findings are present. No enlarged abdominal or pelvic lymph nodes. Reproductive: Calcification of the prostate gland without enlargement. Other: No free air or free fluid in the abdomen. Abdominal wall musculature appears intact. Musculoskeletal: No acute or significant osseous findings. IMPRESSION: Tiny punctate nonobstructing stones in both kidneys. No ureteral stone or obstruction. Bladder wall is mildly thickened, possibly indicating cystitis. Electronically Signed   By: Burman Nieves M.D.   On: 07/17/2018 19:10    Procedures Procedures (including critical care time)  Medications Ordered in ED Medications  sodium chloride 0.9 % bolus 1,000 mL ( Intravenous Stopped 07/17/18 2011)  fentaNYL (SUBLIMAZE) injection 50 mcg (50 mcg Intravenous Given 07/17/18 1852)  ketorolac (TORADOL) 30 MG/ML injection 30 mg (30 mg Intravenous Given 07/17/18 1930)     Initial Impression / Assessment and Plan / ED Course  I have reviewed the triage vital signs and the nursing notes.  Pertinent labs &  imaging results that were available during my care of the patient were reviewed by me and considered in my medical decision making (see chart for details).     38 year old male presents to ED for several hour history of left-sided flank pain.  States it feels similar to prior kidney stones.  Denies any urinary symptoms currently although did have what he thought was a UTI about a month ago.  Vital signs within normal limits.  On my exam there is tenderness palpation of the left flank and left lower back.  No abdominal tenderness palpation.  Lab work including urinalysis, CBC, BMP unremarkable.  CT renal stone study shows several punctuate nonobstructing stones.  I suspect that possibly musculoskeletal pain as his kidney stones do not appear to be the cause of the pain.  He is comfortable with medications given here.  We will have him take anti-inflammatories, muscle relaxer and PCP follow-up.  Advised  to return to ED for any severe worsening symptoms.  Patient is hemodynamically stable, in NAD, and able to ambulate in the ED. Evaluation does not show pathology that would require ongoing emergent intervention or inpatient treatment. I explained the diagnosis to the patient. Pain has been managed and has no complaints prior to discharge. Patient is comfortable with above plan and is stable for discharge at this time. All questions were answered prior to disposition. Strict return precautions for returning to the ED were discussed. Encouraged follow up with PCP.    Portions of this note were generated with Scientist, clinical (histocompatibility and immunogenetics). Dictation errors may occur despite best attempts at proofreading.   Final Clinical Impressions(s) / ED Diagnoses   Final diagnoses:  Nephrolithiasis    ED Discharge Orders         Ordered    naproxen (NAPROSYN) 500 MG tablet  2 times daily     07/17/18 2050    methocarbamol (ROBAXIN) 500 MG tablet  2 times daily     07/17/18 2050           Dietrich Pates, PA-C 07/17/18 2052    Mesner, Barbara Cower, MD 07/18/18 [phone]

## 2018-07-17 NOTE — ED Notes (Signed)
Pt to CT via Rad Tech 

## 2019-01-13 ENCOUNTER — Emergency Department (HOSPITAL_BASED_OUTPATIENT_CLINIC_OR_DEPARTMENT_OTHER)
Admission: EM | Admit: 2019-01-13 | Discharge: 2019-01-13 | Disposition: A | Discharge status: Home / Self Care | Payer: Self-pay | Attending: Emergency Medicine | Admitting: Emergency Medicine

## 2019-01-13 ENCOUNTER — Other Ambulatory Visit: Payer: Self-pay

## 2019-01-13 ENCOUNTER — Encounter (HOSPITAL_BASED_OUTPATIENT_CLINIC_OR_DEPARTMENT_OTHER): Payer: Self-pay | Admitting: Emergency Medicine

## 2019-01-13 DIAGNOSIS — R110 Nausea: Secondary | ICD-10-CM | POA: Insufficient documentation

## 2019-01-13 DIAGNOSIS — T304 Corrosion of unspecified body region, unspecified degree: Secondary | ICD-10-CM

## 2019-01-13 DIAGNOSIS — Z86711 Personal history of pulmonary embolism: Secondary | ICD-10-CM | POA: Insufficient documentation

## 2019-01-13 DIAGNOSIS — R51 Headache: Secondary | ICD-10-CM | POA: Insufficient documentation

## 2019-01-13 DIAGNOSIS — T578X1A Toxic effect of other specified inorganic substances, accidental (unintentional), initial encounter: Secondary | ICD-10-CM | POA: Insufficient documentation

## 2019-01-13 DIAGNOSIS — Z86718 Personal history of other venous thrombosis and embolism: Secondary | ICD-10-CM | POA: Insufficient documentation

## 2019-01-13 DIAGNOSIS — E785 Hyperlipidemia, unspecified: Secondary | ICD-10-CM | POA: Insufficient documentation

## 2019-01-13 DIAGNOSIS — T7840XA Allergy, unspecified, initial encounter: Secondary | ICD-10-CM

## 2019-01-13 MED ORDER — DIPHENHYDRAMINE HCL 50 MG/ML IJ SOLN
50.0000 mg | Freq: Once | INTRAMUSCULAR | Status: AC
  Administered 2019-01-13: 50 mg via INTRAVENOUS
  Filled 2019-01-13: qty 1

## 2019-01-13 MED ORDER — ONDANSETRON HCL 4 MG/2ML IJ SOLN
4.0000 mg | Freq: Once | INTRAMUSCULAR | Status: AC
  Administered 2019-01-13: 4 mg via INTRAVENOUS

## 2019-01-13 MED ORDER — FAMOTIDINE IN NACL 20-0.9 MG/50ML-% IV SOLN
20.0000 mg | Freq: Once | INTRAVENOUS | Status: AC
  Administered 2019-01-13: 20 mg via INTRAVENOUS
  Filled 2019-01-13: qty 50

## 2019-01-13 MED ORDER — EPINEPHRINE 0.3 MG/0.3ML IJ SOAJ: 0.3000 mg | INTRAMUSCULAR | 0 refills | Status: DC | PRN

## 2019-01-13 MED ORDER — PREDNISONE 20 MG PO TABS: ORAL_TABLET | ORAL | 0 refills | Status: DC

## 2019-01-13 MED ORDER — METHYLPREDNISOLONE SODIUM SUCC 125 MG IJ SOLR
125.0000 mg | Freq: Once | INTRAMUSCULAR | Status: AC
  Administered 2019-01-13: 125 mg via INTRAVENOUS
  Filled 2019-01-13: qty 2

## 2019-01-13 MED ORDER — PROCHLORPERAZINE EDISYLATE 10 MG/2ML IJ SOLN
10.0000 mg | Freq: Once | INTRAMUSCULAR | Status: AC
  Administered 2019-01-13: 10 mg via INTRAVENOUS
  Filled 2019-01-13: qty 2

## 2019-01-13 MED ORDER — BACITRACIN ZINC 500 UNIT/GM EX OINT: 1.0000 "application " | TOPICAL_OINTMENT | Freq: Two times a day (BID) | CUTANEOUS | 0 refills | Status: AC

## 2019-01-13 MED ORDER — EPINEPHRINE 0.3 MG/0.3ML IJ SOAJ
0.3000 mg | Freq: Once | INTRAMUSCULAR | Status: AC
  Administered 2019-01-13: 0.3 mg via INTRAMUSCULAR
  Filled 2019-01-13: qty 0.3

## 2019-01-13 MED ORDER — ONDANSETRON HCL 4 MG/2ML IJ SOLN
INTRAMUSCULAR | Status: AC
  Administered 2019-01-13: 4 mg
  Filled 2019-01-13: qty 2
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
## 2019-01-13 NOTE — ED Triage Notes (Signed)
Hives and swelling to face. He states his throat tight. This started last night.

## 2019-01-13 NOTE — ED Notes (Signed)
ED Provider at bedside. Dr. Goldston 
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
## 2019-02-04 ENCOUNTER — Encounter (HOSPITAL_COMMUNITY): Payer: Self-pay | Admitting: Emergency Medicine

## 2019-02-04 ENCOUNTER — Emergency Department (HOSPITAL_COMMUNITY): Payer: Self-pay

## 2019-02-04 ENCOUNTER — Emergency Department (HOSPITAL_COMMUNITY)
Admission: EM | Admit: 2019-02-04 | Discharge: 2019-02-04 | Disposition: A | Discharge status: Home / Self Care | Payer: Self-pay | Attending: Emergency Medicine | Admitting: Emergency Medicine

## 2019-02-04 ENCOUNTER — Other Ambulatory Visit: Payer: Self-pay

## 2019-02-04 DIAGNOSIS — Z79899 Other long term (current) drug therapy: Secondary | ICD-10-CM | POA: Insufficient documentation

## 2019-02-04 DIAGNOSIS — R1011 Right upper quadrant pain: Secondary | ICD-10-CM

## 2019-02-04 DIAGNOSIS — R1013 Epigastric pain: Secondary | ICD-10-CM | POA: Insufficient documentation

## 2019-02-04 DIAGNOSIS — R0789 Other chest pain: Secondary | ICD-10-CM | POA: Insufficient documentation

## 2019-02-04 LAB — COMPREHENSIVE METABOLIC PANEL
ALT: 33 U/L (ref 0–44)
AST: 35 U/L (ref 15–41)
Albumin: 3.8 g/dL (ref 3.5–5.0)
Alkaline Phosphatase: 65 U/L (ref 38–126)
Anion gap: 13 (ref 5–15)
BUN: 11 mg/dL (ref 6–20)
CO2: 22 mmol/L (ref 22–32)
Calcium: 8.6 mg/dL — ABNORMAL LOW (ref 8.9–10.3)
Chloride: 101 mmol/L (ref 98–111)
Creatinine, Ser: 1.28 mg/dL — ABNORMAL HIGH (ref 0.61–1.24)
GFR calc Af Amer: >60 mL/min (ref >60)
GFR calc non Af Amer: >60 mL/min (ref >60)
Glucose, Bld: 86 mg/dL (ref 70–99)
Potassium: 3.7 mmol/L (ref 3.5–5.1)
Sodium: 136 mmol/L (ref 135–145)
Total Bilirubin: 0.9 mg/dL (ref 0.3–1.2)
Total Protein: 6.5 g/dL (ref 6.5–8.1)

## 2019-02-04 LAB — URINALYSIS, ROUTINE W REFLEX MICROSCOPIC
Bilirubin Urine: NEGATIVE
Glucose, UA: NEGATIVE mg/dL
Hgb urine dipstick: NEGATIVE
Ketones, ur: 5 mg/dL — AB
Leukocytes,Ua: NEGATIVE
Nitrite: NEGATIVE
Protein, ur: NEGATIVE mg/dL
Specific Gravity, Urine: 1.029 (ref 1.005–1.030)
pH: 5 (ref 5.0–8.0)

## 2019-02-04 LAB — LIPASE, BLOOD: Lipase: 34 U/L (ref 11–51)

## 2019-02-04 LAB — CBC
HCT: 47.3 % (ref 39.0–52.0)
Hemoglobin: 15.7 g/dL (ref 13.0–17.0)
MCH: 29.1 pg (ref 26.0–34.0)
MCHC: 33.2 g/dL (ref 30.0–36.0)
MCV: 87.8 fL (ref 80.0–100.0)
Platelets: 230 10*3/uL (ref 150–400)
RBC: 5.39 MIL/uL (ref 4.22–5.81)
RDW: 12.6 % (ref 11.5–15.5)
WBC: 4.5 10*3/uL (ref 4.0–10.5)
nRBC: 0 % (ref 0.0–0.2)

## 2019-02-04 MED ORDER — FAMOTIDINE 20 MG PO TABS: 20.0000 mg | ORAL_TABLET | Freq: Two times a day (BID) | ORAL | 0 refills | Status: DC

## 2019-02-04 MED ORDER — ALUM & MAG HYDROXIDE-SIMETH 200-200-20 MG/5ML PO SUSP
30.0000 mL | Freq: Once | ORAL | Status: AC
  Administered 2019-02-04: 19:00:00 30 mL via ORAL
  Filled 2019-02-04: qty 30

## 2019-02-04 MED ORDER — SUCRALFATE 1 G PO TABS
1.0000 g | ORAL_TABLET | Freq: Once | ORAL | Status: AC
  Administered 2019-02-04: 19:00:00 1 g via ORAL
  Filled 2019-02-04: qty 1

## 2019-02-04 MED ORDER — SUCRALFATE 1 G PO TABS: 1.0000 g | ORAL_TABLET | Freq: Three times a day (TID) | ORAL | 0 refills | Status: DC

## 2019-02-04 MED ORDER — SODIUM CHLORIDE 0.9% FLUSH
3.0000 mL | Freq: Once | INTRAVENOUS | Status: AC
  Administered 2019-02-04: 19:00:00 3 mL via INTRAVENOUS

## 2019-02-04 MED ORDER — MORPHINE SULFATE (PF) 4 MG/ML IV SOLN
4.0000 mg | Freq: Once | INTRAVENOUS | Status: AC
  Administered 2019-02-04: 19:00:00 4 mg via INTRAVENOUS
  Filled 2019-02-04: qty 1

## 2019-02-04 MED ORDER — PANTOPRAZOLE SODIUM 40 MG IV SOLR
40.0000 mg | Freq: Once | INTRAVENOUS | Status: AC
  Administered 2019-02-04: 19:00:00 40 mg via INTRAVENOUS
  Filled 2019-02-04: qty 40

## 2019-02-04 MED ORDER — FAMOTIDINE IN NACL 20-0.9 MG/50ML-% IV SOLN
20.0000 mg | Freq: Once | INTRAVENOUS | Status: AC
  Administered 2019-02-04: 20 mg via INTRAVENOUS
  Filled 2019-02-04: qty 50

## 2019-02-04 MED ORDER — SODIUM CHLORIDE 0.9 % IV BOLUS
1000.0000 mL | Freq: Once | INTRAVENOUS | Status: AC
  Administered 2019-02-04: 19:00:00 1000 mL via INTRAVENOUS

## 2019-02-04 NOTE — Discharge Instructions (Addendum)
I recommend Zegerid if you choose a PPI Follow a bland diet Call your GI specialist tomorrow.  Go to [employer] on your phone for a discount on your medications  Please follow up with your primary doctor within the next 5-7 days.  If you do not have a primary care provider, information for a healthcare clinic has been provided for you to make arrangements for follow up care. Please return to the ER sooner if you have any new or worsening symptoms, or if you have any of the following symptoms:  Abdominal pain that does not go away.  You have a fever.  You keep throwing up (vomiting).  The pain is felt only in portions of the abdomen. Pain in the right side could possibly be appendicitis. In an adult, pain in the left lower portion of the abdomen could be colitis or diverticulitis.  You pass bloody or black tarry stools.  There is bright red blood in the stool.  The constipation stays for more than 4 days.  There is belly (abdominal) or rectal pain.  You do not seem to be getting better.  You have any questions or concerns.

## 2019-02-04 NOTE — ED Provider Notes (Signed)
[facility] Provider Note   CSN: [id number] Arrival date & time: 02/04/19  1609     History   Chief Complaint Chief Complaint  Patient presents with  . Abdominal Pain    HPI Jimmy Fisher is a 38 y.o. male.     HPI   Pt is a 38 y/o male with a h/o GERD, HLD, hiatal hernia, VTE (s/p prolonged admission), who presents to the ED today for eval of abd pain.   States he has had H. Pylori for over a year. Was in clinical trial and was on a round of medication. Following completion of this he still had H. pylori. He had EGD and was noted to had hiatal hernia, PUD, and gastritis. States he has another breath test coming up in 18 days and was told to discontinue his PPI prior to being retested for H. Pylori. He has not been taking any H2blockers or PPIs for about 8 months. States that for the last month he has had worsening GERD and has been eating "tums like candy". Epigastric abd pain worsened about 1 month ago. Three days ago he began to have a severe gnawing pain to the epigastric abdomen. Pain is worse with anything he eats. States pain is so bad he has not eaten in 2 days. Reports associated nausea and diarrhea, but denies vomiting. Denies fevers. He c/o right lateral chest pain that has been intermittent. Denies any pain currently. It is intermittently pleuritic. Denies SOB, hemoptysis.   Has bilat calf pain after walking for a long period of time recently. Denies recent surgery/trauma, recent long travel, hormone use, personal hx of cancer.  Denies ETOH use. He states uses aleve daily. Denies other NSAID use.  He has had a mild cough with yellow mucous.   Past Medical History:  Diagnosis Date  . DVT (deep venous thrombosis) (HCC)   . GERD (gastroesophageal reflux disease)   . Hiatal hernia   . Hypercholesteremia   . Pulmonary embolism (HCC)     There are no active problems to display for this patient.   History reviewed. No  pertinent surgical history.      Home Medications    Prior to Admission medications   Medication Sig Start Date End Date Taking? Authorizing Provider  EPINEPHrine (EPIPEN 2-PAK) 0.3 mg/0.3 mL IJ SOAJ injection Inject 0.3 mLs (0.3 mg total) into the muscle as needed for anaphylaxis. 01/13/19   Pricilla LovelessGoldston, Scott, MD  predniSONE (DELTASONE) 20 MG tablet 2 tabs po daily x 4 days 01/14/19   Pricilla LovelessGoldston, Scott, MD  omeprazole (PRILOSEC) 20 MG capsule Take 2 capsules (40 mg total) by mouth 2 (two) times daily. 01/19/17 01/13/19  Rolland PorterJames, Mark, MD  sucralfate (CARAFATE) 1 g tablet Take 1 tablet (1 g total) by mouth 4 (four) times daily. 01/19/17 01/13/19  Rolland PorterJames, Mark, MD    Family History History reviewed. No pertinent family history.  Social History Social History   Tobacco Use  . Smoking status: Never Smoker  . Smokeless tobacco: Never Used  Substance Use Topics  . Alcohol use: No  . Drug use: No     Allergies   Patient has no known allergies.   Review of Systems Review of Systems  Constitutional: Negative for fever.  HENT: Negative for ear pain and sore throat.   Eyes: Negative for visual disturbance.  Respiratory: Negative for cough and shortness of breath.   Cardiovascular: Positive for chest pain.  Gastrointestinal: Positive for abdominal pain, diarrhea  and nausea. Negative for constipation and vomiting.  Genitourinary: Negative for dysuria and hematuria.  Musculoskeletal: Negative for back pain.  Skin: Negative for rash.  Neurological: Negative for headaches.  All other systems reviewed and are negative.    Physical Exam Updated Vital Signs BP 119/89 (BP Location: Right Arm)   Pulse 89   Temp 99 F (37.2 C) (Oral)   Resp 16   SpO2 97%   Physical Exam Vitals signs and nursing note reviewed.  Constitutional:      General: He is not in acute distress.    Appearance: He is well-developed. He is not ill-appearing or toxic-appearing.  HENT:     Head: Normocephalic and  atraumatic.  Eyes:     Conjunctiva/sclera: Conjunctivae normal.  Neck:     Musculoskeletal: Neck supple.  Cardiovascular:     Rate and Rhythm: Normal rate and regular rhythm.     Heart sounds: Normal heart sounds. No murmur.  Pulmonary:     Effort: Pulmonary effort is normal. No respiratory distress.     Breath sounds: Normal breath sounds. No wheezing, rhonchi or rales.  Abdominal:     General: Bowel sounds are normal.     Palpations: Abdomen is soft.     Tenderness: There is abdominal tenderness in the epigastric area. There is guarding. There is no right CVA tenderness, left CVA tenderness or rebound.  Skin:    General: Skin is warm and dry.  Neurological:     Mental Status: He is alert.      ED Treatments / Results  Labs (all labs ordered are listed, but only abnormal results are displayed) Labs Reviewed  COMPREHENSIVE METABOLIC PANEL - Abnormal; Notable for the following components:      Result Value   Creatinine, Ser 1.28 (*)    Calcium 8.6 (*)    All other components within normal limits  URINALYSIS, ROUTINE W REFLEX MICROSCOPIC - Abnormal; Notable for the following components:   Ketones, ur 5 (*)    All other components within normal limits  LIPASE, BLOOD  CBC    EKG None  Radiology No results found.  Procedures Procedures (including critical care time)  Medications Ordered in ED Medications  sodium chloride flush (NS) 0.9 % injection 3 mL (3 mLs Intravenous Given 02/04/19 1842)  alum & mag hydroxide-simeth (MAALOX/MYLANTA) 200-200-20 MG/5ML suspension 30 mL (30 mLs Oral Given 02/04/19 1842)  famotidine (PEPCID) IVPB 20 mg premix (0 mg Intravenous Stopped 02/04/19 1848)  pantoprazole (PROTONIX) injection 40 mg (40 mg Intravenous Given 02/04/19 1841)  sodium chloride 0.9 % bolus 1,000 mL (1,000 mLs Intravenous New Bag/Given 02/04/19 1843)  morphine 4 MG/ML injection 4 mg (4 mg Intravenous Given 02/04/19 1841)  sucralfate (CARAFATE) tablet 1 g (1 g Oral Given  02/04/19 1844)     Initial Impression / Assessment and Plan / ED Course  I have reviewed the triage vital signs and the nursing notes.  Pertinent labs & imaging results that were available during my care of the patient were reviewed by me and considered in my medical decision making (see chart for details).   Final Clinical Impressions(s) / ED Diagnoses   Final diagnoses:  RUQ pain   38 year old male presenting for evaluation of abdominal pain ongoing for the last month but worsened a few days ago.  Worse with eating.  Associated with nausea.  No vomiting.  Has had some diarrhea.  No fevers.  Has history of PUD, H. pylori and gastritis.    Vital  signs are reassuring.  On exam patient has some epigastric tenderness on exam.  Abdomen is soft.  There is no rebound tenderness.  He does have some voluntary guarding to the epigastrium.  We will obtain labs and give medications and reassess symptoms.  Will obtain abdominal x-ray to rule out perforation, though lower suspicion for this.  Will obtain right upper quadrant offer sound to rule out gallbladder pathology.  CBC is without leukocytosis CMP is with normal electrolytes.  Does have mild elevation in kidney function at 1.28 up from 1 6 months ago.  Normal LFTs and bilirubin. Lipase is negative UA without evidence of urinary tract infection  Patient care transition to Arthor CaptainAbigail Harris, PA-C at shift change with plan to follow-up on abdominal x-ray and right upper quadrant ultrasound.  We will also need to reassess patient.  If studies are negative and patient repeat abdominal exam is benign and he is likely safe for discharge on a course of PPI's and Carafate.   ED Discharge Orders    None       Rayne DuCouture, Andy Moye S, PA-C 02/04/19 Ishmael [id number]    Pfeiffer, Marcy, MD 02/21/19 [phone]
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
## 2019-02-04 NOTE — ED Notes (Signed)
Patient verbalized understanding of discharge instructions. Opportunity for questions were provided. Pt. ambulatory and discharged home.  

## 2019-02-04 NOTE — ED Notes (Signed)
Patient transported to X-ray 

## 2019-02-04 NOTE — ED Triage Notes (Signed)
Pt here for evaluation of upper abdominal pain that radiates to his back. This has been an ongoing issue for over a year, worsening on Friday.    Pt states "Ive never had pain like this and unable to eat since Friday".  Pt states he is being treated for H-Pylori and is current in the "flush period".

## 2019-02-26 ENCOUNTER — Encounter (HOSPITAL_BASED_OUTPATIENT_CLINIC_OR_DEPARTMENT_OTHER): Payer: Self-pay

## 2019-02-26 ENCOUNTER — Emergency Department (HOSPITAL_BASED_OUTPATIENT_CLINIC_OR_DEPARTMENT_OTHER): Payer: Self-pay

## 2019-02-26 ENCOUNTER — Emergency Department (HOSPITAL_BASED_OUTPATIENT_CLINIC_OR_DEPARTMENT_OTHER)
Admission: EM | Admit: 2019-02-26 | Discharge: 2019-02-26 | Disposition: A | Discharge status: Home / Self Care | Payer: Self-pay | Attending: Emergency Medicine | Admitting: Emergency Medicine

## 2019-02-26 ENCOUNTER — Other Ambulatory Visit: Payer: Self-pay

## 2019-02-26 DIAGNOSIS — J069 Acute upper respiratory infection, unspecified: Secondary | ICD-10-CM | POA: Insufficient documentation

## 2019-02-26 DIAGNOSIS — Z20828 Contact with and (suspected) exposure to other viral communicable diseases: Secondary | ICD-10-CM | POA: Insufficient documentation

## 2019-02-26 LAB — GROUP A STREP BY PCR: Group A Strep by PCR: NOT DETECTED

## 2019-02-26 MED ORDER — ACETAMINOPHEN 325 MG PO TABS
650.0000 mg | ORAL_TABLET | Freq: Once | ORAL | Status: AC
  Administered 2019-02-26: 650 mg via ORAL
  Filled 2019-02-26: qty 2

## 2019-02-26 NOTE — ED Notes (Signed)
Pt states severe throat pain since yesterday, throat red with white spots noted. Lung/chest pain at rest. Bilat lung sounds clear. Denies smoking but dips/chews occasionally. Pain 8/10.

## 2019-02-26 NOTE — ED Provider Notes (Signed)
[facility] EMERGENCY DEPARTMENT Provider Note   CSN: [id number] Arrival date & time: 02/26/19  1149     History   Chief Complaint Chief Complaint  Patient presents with  . Cough    HPI Jimmy Fisher is a 38 y.o. male.     HPI  38 year old male presents with sore throat.  Started yesterday.  He also feels like he has some discomfort in his chest like his lungs are hurting.  Some mild cough.  Felt like he has had a fever.  He states he has red spots in his throat. No obvious sick contacts.  Past Medical History:  Diagnosis Date  . DVT (deep venous thrombosis) (HCC)   . GERD (gastroesophageal reflux disease)   . Hiatal hernia   . Hypercholesteremia   . Pulmonary embolism (HCC)     There are no active problems to display for this patient.   History reviewed. No pertinent surgical history.      Home Medications    Prior to Admission medications   Medication Sig Start Date End Date Taking? Authorizing Provider  EPINEPHrine (EPIPEN 2-PAK) 0.3 mg/0.3 mL IJ SOAJ injection Inject 0.3 mLs (0.3 mg total) into the muscle as needed for anaphylaxis. 01/13/19   Pricilla LovelessGoldston, Kenyan Karnes, MD  famotidine (PEPCID) 20 MG tablet Take 1 tablet (20 mg total) by mouth 2 (two) times daily. 02/04/19   Arthor CaptainHarris, Abigail, PA-C  predniSONE (DELTASONE) 20 MG tablet 2 tabs po daily x 4 days 01/14/19   Pricilla LovelessGoldston, Zariah Cavendish, MD  sucralfate (CARAFATE) 1 g tablet Take 1 tablet (1 g total) by mouth 4 (four) times daily -  with meals and at bedtime. 02/04/19   Arthor CaptainHarris, Abigail, PA-C  omeprazole (PRILOSEC) 20 MG capsule Take 2 capsules (40 mg total) by mouth 2 (two) times daily. 01/19/17 01/13/19  Rolland PorterJames, Mark, MD    Family History No family history on file.  Social History Social History   Tobacco Use  . Smoking status: Never Smoker  . Smokeless tobacco: Never Used  Substance Use Topics  . Alcohol use: No  . Drug use: No     Allergies   Patient has no known allergies.   Review of Systems Review  of Systems  Constitutional: Positive for fever (subjective).  HENT: Positive for sore throat.   Respiratory: Positive for cough. Negative for shortness of breath.   Cardiovascular: Positive for chest pain.  Gastrointestinal: Negative for vomiting.  All other systems reviewed and are negative.    Physical Exam Updated Vital Signs BP 118/84 (BP Location: Right Arm)   Pulse 96   Temp 99.5 F (37.5 C) (Oral)   Resp 14   Ht 5\' 6"  (1.676 m)   Wt 86.2 kg   SpO2 97%   BMI 30.67 kg/m   Physical Exam Vitals signs and nursing note reviewed.  Constitutional:      General: He is not in acute distress.    Appearance: He is well-developed. He is not ill-appearing or diaphoretic.  HENT:     Head: Normocephalic and atraumatic.     Right Ear: External ear normal.     Left Ear: External ear normal.     Nose: Nose normal.     Mouth/Throat:     Pharynx: Uvula midline. Posterior oropharyngeal erythema present. No uvula swelling.     Tonsils: No tonsillar abscesses. 1+ on the right. 1+ on the left.  Eyes:     General:        Right eye: No  discharge.        Left eye: No discharge.  Neck:     Musculoskeletal: Normal range of motion and neck supple. No neck rigidity.  Cardiovascular:     Rate and Rhythm: Normal rate and regular rhythm.     Heart sounds: Normal heart sounds.  Pulmonary:     Effort: Pulmonary effort is normal.     Breath sounds: Normal breath sounds.  Abdominal:     Palpations: Abdomen is soft.     Tenderness: There is no abdominal tenderness.  Skin:    General: Skin is warm and dry.  Neurological:     Mental Status: He is alert.  Psychiatric:        Mood and Affect: Mood is not anxious.      ED Treatments / Results  Labs (all labs ordered are listed, but only abnormal results are displayed) Labs Reviewed  GROUP A STREP BY PCR  NOVEL CORONAVIRUS, NAA (HOSP ORDER, SEND-OUT TO REF LAB; TAT 18-24 HRS)    EKG None  Radiology Dg Chest Portable 1 View  Result  Date: 02/26/2019 CLINICAL DATA:  Flu like symptoms for 2 days.  History of DVT. EXAM: PORTABLE CHEST 1 VIEW COMPARISON:  Radiographs 08/22/2010. FINDINGS: 1355 hours. Lordotic positioning. The heart size and mediastinal contours are stable. There is mild scarring laterally at the right lung base. The lungs are otherwise clear. There is no pleural effusion or pneumothorax. The bones appear unremarkable. IMPRESSION: No evidence of active cardiopulmonary process. Electronically Signed   By: Richardean Sale M.D.   On: 02/26/2019 14:06    Procedures Procedures (including critical care time)  Medications Ordered in ED Medications  acetaminophen (TYLENOL) tablet 650 mg (650 mg Oral Given 02/26/19 1434)     Initial Impression / Assessment and Plan / ED Course  I have reviewed the triage vital signs and the nursing notes.  Pertinent labs & imaging results that were available during my care of the patient were reviewed by me and considered in my medical decision making (see chart for details).        Patient has low-grade fever here but otherwise appears well.  Likely has a viral illness given negative strep test and no pneumonia.  He has some chest complaints and he does have a history of PE but my suspicion is this is infectious.  Otherwise, I will send testing for the novel coronavirus.  He appears well enough for discharge with return precautions.  Jimmy Fisher was evaluated in Emergency Department on 02/26/2019 for the symptoms described in the history of present illness. He was evaluated in the context of the global COVID-19 pandemic, which necessitated consideration that the patient might be at risk for infection with the SARS-CoV-2 virus that causes COVID-19. Institutional protocols and algorithms that pertain to the evaluation of patients at risk for COVID-19 are in a state of rapid change based on information released by regulatory bodies including the CDC and federal and state  organizations. These policies and algorithms were followed during the patient's care in the ED.   Final Clinical Impressions(s) / ED Diagnoses   Final diagnoses:  Acute upper respiratory infection    ED Discharge Orders    None       Sherwood Gambler, MD 02/26/19 1530

## 2019-02-26 NOTE — ED Triage Notes (Signed)
Pt c/o flu like sx day 2-NAD-steady gait

## 2019-02-27 LAB — NOVEL CORONAVIRUS, NAA (HOSP ORDER, SEND-OUT TO REF LAB; TAT 18-24 HRS): SARS-CoV-2, NAA: NOT DETECTED

## 2019-02-28 ENCOUNTER — Emergency Department (HOSPITAL_COMMUNITY)
Admission: EM | Admit: 2019-02-28 | Discharge: 2019-02-28 | Disposition: A | Discharge status: Home / Self Care | Payer: Self-pay | Attending: Emergency Medicine | Admitting: Emergency Medicine

## 2019-02-28 DIAGNOSIS — Z5321 Procedure and treatment not carried out due to patient leaving prior to being seen by health care provider: Secondary | ICD-10-CM | POA: Insufficient documentation
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
## 2019-04-24 ENCOUNTER — Other Ambulatory Visit: Payer: Self-pay

## 2019-04-24 DIAGNOSIS — Z20822 Contact with and (suspected) exposure to covid-19: Secondary | ICD-10-CM

## 2019-04-25 LAB — NOVEL CORONAVIRUS, NAA: SARS-CoV-2, NAA: NOT DETECTED

## 2019-04-26 ENCOUNTER — Emergency Department (HOSPITAL_BASED_OUTPATIENT_CLINIC_OR_DEPARTMENT_OTHER): Payer: No Typology Code available for payment source

## 2019-04-26 ENCOUNTER — Emergency Department (HOSPITAL_BASED_OUTPATIENT_CLINIC_OR_DEPARTMENT_OTHER)
Admission: EM | Admit: 2019-04-26 | Discharge: 2019-04-26 | Disposition: A | Discharge status: Home / Self Care | Payer: No Typology Code available for payment source | Attending: Emergency Medicine | Admitting: Emergency Medicine

## 2019-04-26 ENCOUNTER — Encounter (HOSPITAL_BASED_OUTPATIENT_CLINIC_OR_DEPARTMENT_OTHER): Payer: Self-pay | Admitting: *Deleted

## 2019-04-26 ENCOUNTER — Other Ambulatory Visit: Payer: Self-pay

## 2019-04-26 DIAGNOSIS — Y9389 Activity, other specified: Secondary | ICD-10-CM | POA: Diagnosis not present

## 2019-04-26 DIAGNOSIS — Y999 Unspecified external cause status: Secondary | ICD-10-CM | POA: Insufficient documentation

## 2019-04-26 DIAGNOSIS — M542 Cervicalgia: Secondary | ICD-10-CM | POA: Diagnosis not present

## 2019-04-26 DIAGNOSIS — Y92410 Unspecified street and highway as the place of occurrence of the external cause: Secondary | ICD-10-CM | POA: Insufficient documentation

## 2019-04-26 DIAGNOSIS — M25551 Pain in right hip: Secondary | ICD-10-CM | POA: Insufficient documentation

## 2019-04-26 DIAGNOSIS — Z79899 Other long term (current) drug therapy: Secondary | ICD-10-CM | POA: Insufficient documentation

## 2019-04-26 MED ORDER — KETOROLAC TROMETHAMINE 60 MG/2ML IM SOLN
60.0000 mg | Freq: Once | INTRAMUSCULAR | Status: AC
  Administered 2019-04-26: 60 mg via INTRAMUSCULAR
  Filled 2019-04-26: qty 2

## 2019-04-26 MED ORDER — CYCLOBENZAPRINE HCL 7.5 MG PO TABS: 7.5000 mg | ORAL_TABLET | Freq: Three times a day (TID) | ORAL | 0 refills | Status: DC | PRN

## 2019-04-26 MED FILL — CYCLOBENZAPRINE HCL 10 MG T: 10 | 10 days supply | Qty: 30 | Fill #0
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
## 2019-05-17 ENCOUNTER — Ambulatory Visit (INDEPENDENT_AMBULATORY_CARE_PROVIDER_SITE_OTHER): Payer: Self-pay | Admitting: Family Medicine

## 2019-05-17 ENCOUNTER — Encounter: Payer: Self-pay | Admitting: Family Medicine

## 2019-05-17 ENCOUNTER — Other Ambulatory Visit: Payer: Self-pay

## 2019-05-17 VITALS — BP 124/88 | HR 88 | Ht 66.0 in | Wt 180.0 lb

## 2019-05-17 DIAGNOSIS — M542 Cervicalgia: Secondary | ICD-10-CM

## 2019-05-17 DIAGNOSIS — M7918 Myalgia, other site: Secondary | ICD-10-CM

## 2019-05-17 MED ORDER — PREDNISONE 5 MG PO TABS: ORAL_TABLET | ORAL | 0 refills | Status: DC

## 2019-05-17 MED FILL — predniSONE 5 MG TABS: 5 | 6 days supply | Qty: 21 | Fill #0

## 2019-05-17 NOTE — Patient Instructions (Signed)
Nice to meet you  Please try Aspercreme with lidocaine. Please do not take ibuprofen with the prednisone  Please try the range of motion movements  Physical therapy will give you a call.  Please send me a message in MyChart with any questions or updates.  Please see me back in 4 weeks.   --Dr. Raeford Razor
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
## 2019-05-17 NOTE — Progress Notes (Signed)
Medication Samples have been provided to the patient.  Drug name: Duexis       Strength: 800mg /26.6mg        Qty: 2 Boxes  LOT/47.$[id number]  Exp.Date: 05/2020  Dosing instructions: Take 1 tablet by mouth three (3) times a day.  The patient has been instructed regarding the correct time, dose, and frequency of taking this medication, including desired effects and most common side effects.   06/2020, Sherrie George 4:32 PM 05/17/2019
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
## 2019-05-18 ENCOUNTER — Ambulatory Visit: Payer: Self-pay | Admitting: Pediatrics

## 2019-05-29 ENCOUNTER — Ambulatory Visit: Payer: Self-pay | Attending: Family Medicine | Admitting: Physical Therapy

## 2019-05-29 ENCOUNTER — Other Ambulatory Visit: Payer: Self-pay

## 2019-05-29 DIAGNOSIS — M5413 Radiculopathy, cervicothoracic region: Secondary | ICD-10-CM | POA: Insufficient documentation

## 2019-05-29 DIAGNOSIS — M62838 Other muscle spasm: Secondary | ICD-10-CM | POA: Insufficient documentation

## 2019-05-29 DIAGNOSIS — M542 Cervicalgia: Secondary | ICD-10-CM | POA: Insufficient documentation

## 2019-05-29 DIAGNOSIS — R293 Abnormal posture: Secondary | ICD-10-CM | POA: Insufficient documentation
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
## 2019-05-29 NOTE — Patient Instructions (Addendum)
TENS stands for Transcutaneous Electrical Nerve Stimulation. In other words, electrical impulses are allowed to pass through the skin in order to excite a nerve.   Purpose and Use of TENS:  TENS is a method used to manage acute and chronic pain without the use of drugs. It has been effective in managing pain associated with surgery, sprains, strains, trauma, rheumatoid arthritis, and neuralgias. It is a non-addictive, low risk, and non-invasive technique used to control pain. It is not, by any means, a curative form of treatment.   How TENS Works:  Most TENS units are a Paramedic unit powered by one 9 volt battery. Attached to the outside of the unit are two lead wires where two pins and/or snaps connect on each wire. All units come with a set of four reusable pads or electrodes. These are placed on the skin surrounding the area involved. By inserting the leads into  the pads, the electricity can pass from the unit making the circuit complete.  As the intensity is turned up slowly, the electrical current enters the body from the electrodes through the skin to the surrounding nerve fibers. This triggers the release of hormones from within the body. These hormones contain pain relievers. By increasing the circulation of these hormones, the person's pain may be lessened. It is also believed that the electrical stimulation itself helps to block the pain messages being sent to the brain, thus also decreasing the body's perception of pain.   Hazards:  TENS units are NOT to be used by patients with PACEMAKERS, DEFIBRILLATORS, DIABETIC PUMPS, PREGNANT WOMEN, and patients with SEIZURE DISORDERS.  TENS units are NOT to be used over the heart, throat, brain, or spinal cord.  One of the major side effects from the TENS unit may be skin irritation. Some people may develop a rash if they are sensitive to the materials used in the electrodes or the connecting wires.   Wear the unit for up to 30 minutes,  3-4x/day.   Avoid overuse due the body getting used to the stem making it not as effective over time.        Home exercise program created by Jimmy Fisher, Jimmy Fisher.  For questions, please contact Jimmy Fisher via phone at [phone] or email at Jimmy Fisher  Jimmy Fisher, Jimmy Fisher, Jimmy Fisher Phone: [phone]   Fax:  [phone]

## 2019-05-31 ENCOUNTER — Ambulatory Visit: Payer: Self-pay | Admitting: Pediatrics

## 2019-06-05 ENCOUNTER — Encounter: Payer: Self-pay | Admitting: Physical Therapy

## 2019-06-05 ENCOUNTER — Ambulatory Visit: Payer: Self-pay | Admitting: Physical Therapy

## 2019-06-05 ENCOUNTER — Other Ambulatory Visit: Payer: Self-pay

## 2019-06-05 DIAGNOSIS — R293 Abnormal posture: Secondary | ICD-10-CM

## 2019-06-05 DIAGNOSIS — M542 Cervicalgia: Secondary | ICD-10-CM

## 2019-06-05 DIAGNOSIS — M62838 Other muscle spasm: Secondary | ICD-10-CM

## 2019-06-05 DIAGNOSIS — M5413 Radiculopathy, cervicothoracic region: Secondary | ICD-10-CM

## 2019-06-05 NOTE — Patient Instructions (Signed)
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
## 2019-06-12 ENCOUNTER — Ambulatory Visit: Payer: Self-pay

## 2019-06-12 ENCOUNTER — Other Ambulatory Visit: Payer: Self-pay

## 2019-06-12 DIAGNOSIS — M542 Cervicalgia: Secondary | ICD-10-CM

## 2019-06-12 DIAGNOSIS — M5413 Radiculopathy, cervicothoracic region: Secondary | ICD-10-CM

## 2019-06-12 DIAGNOSIS — M62838 Other muscle spasm: Secondary | ICD-10-CM

## 2019-06-12 DIAGNOSIS — R293 Abnormal posture: Secondary | ICD-10-CM
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
## 2019-06-13 ENCOUNTER — Ambulatory Visit: Payer: Self-pay | Admitting: Family Medicine

## 2019-06-14 ENCOUNTER — Ambulatory Visit: Payer: Self-pay

## 2019-06-14 ENCOUNTER — Other Ambulatory Visit: Payer: Self-pay

## 2019-06-14 DIAGNOSIS — M542 Cervicalgia: Secondary | ICD-10-CM

## 2019-06-14 DIAGNOSIS — M62838 Other muscle spasm: Secondary | ICD-10-CM

## 2019-06-14 DIAGNOSIS — M5413 Radiculopathy, cervicothoracic region: Secondary | ICD-10-CM

## 2019-06-14 DIAGNOSIS — R293 Abnormal posture: Secondary | ICD-10-CM
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
## 2019-06-19 ENCOUNTER — Ambulatory Visit: Payer: Self-pay | Attending: Family Medicine

## 2019-06-19 ENCOUNTER — Other Ambulatory Visit: Payer: Self-pay

## 2019-06-19 DIAGNOSIS — R293 Abnormal posture: Secondary | ICD-10-CM

## 2019-06-19 DIAGNOSIS — M62838 Other muscle spasm: Secondary | ICD-10-CM

## 2019-06-19 DIAGNOSIS — M5413 Radiculopathy, cervicothoracic region: Secondary | ICD-10-CM

## 2019-06-19 DIAGNOSIS — M542 Cervicalgia: Secondary | ICD-10-CM
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
## 2019-06-20 ENCOUNTER — Encounter: Payer: Self-pay | Admitting: Family Medicine

## 2019-06-20 ENCOUNTER — Ambulatory Visit (INDEPENDENT_AMBULATORY_CARE_PROVIDER_SITE_OTHER): Payer: Self-pay | Admitting: Family Medicine

## 2019-06-20 ENCOUNTER — Other Ambulatory Visit: Payer: Self-pay

## 2019-06-20 VITALS — BP 123/80 | HR 72 | Ht 66.0 in | Wt 180.0 lb

## 2019-06-20 DIAGNOSIS — M542 Cervicalgia: Secondary | ICD-10-CM

## 2019-06-20 NOTE — Patient Instructions (Signed)
Good to see you  Please continue heat and physical therapy  Please send me a message in MyChart with any questions or updates.  We will schedule a virtual visit once the MRI is resulted.   --Dr. Jordan Likes
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
## 2019-06-20 NOTE — Progress Notes (Signed)
Medication Samples have been provided to the patient.  Drug name: Duexis       Strength: 800mg /26.6mg         Qty: 2 Boxes  LOT  Exp.Date: 05/2020  Dosing instructions: Take 1 tablet by mouth three (3) times a day.   The patient has been instructed regarding the correct time, dose, and frequency of taking this medication, including desired effects and most common side effects.   06/2020, MA 3:59 PM 06/20/2019
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
## 2019-06-26 ENCOUNTER — Ambulatory Visit: Payer: Self-pay

## 2019-06-26 ENCOUNTER — Other Ambulatory Visit: Payer: Self-pay

## 2019-06-26 DIAGNOSIS — M5413 Radiculopathy, cervicothoracic region: Secondary | ICD-10-CM

## 2019-06-26 DIAGNOSIS — M542 Cervicalgia: Secondary | ICD-10-CM

## 2019-06-26 DIAGNOSIS — M62838 Other muscle spasm: Secondary | ICD-10-CM

## 2019-06-26 DIAGNOSIS — R293 Abnormal posture: Secondary | ICD-10-CM
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
## 2019-06-28 ENCOUNTER — Ambulatory Visit: Payer: Self-pay | Admitting: Physical Therapy

## 2019-06-28 ENCOUNTER — Encounter: Payer: Self-pay | Admitting: Physical Therapy

## 2019-06-28 ENCOUNTER — Other Ambulatory Visit: Payer: Self-pay

## 2019-06-28 DIAGNOSIS — M62838 Other muscle spasm: Secondary | ICD-10-CM

## 2019-06-28 DIAGNOSIS — M542 Cervicalgia: Secondary | ICD-10-CM

## 2019-06-28 DIAGNOSIS — M5413 Radiculopathy, cervicothoracic region: Secondary | ICD-10-CM

## 2019-06-28 DIAGNOSIS — R293 Abnormal posture: Secondary | ICD-10-CM
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
## 2019-06-28 NOTE — Patient Instructions (Signed)

## 2019-06-29 ENCOUNTER — Ambulatory Visit
Admission: RE | Admit: 2019-06-29 | Discharge: 2019-06-29 | Disposition: A | Discharge status: Home / Self Care | Payer: Self-pay | Source: Ambulatory Visit | Attending: Family Medicine | Admitting: Family Medicine

## 2019-06-29 DIAGNOSIS — M542 Cervicalgia: Secondary | ICD-10-CM

## 2019-07-03 ENCOUNTER — Ambulatory Visit: Payer: Self-pay | Admitting: Physical Therapy

## 2019-07-03 ENCOUNTER — Encounter: Payer: Self-pay | Admitting: Physical Therapy

## 2019-07-03 ENCOUNTER — Telehealth (INDEPENDENT_AMBULATORY_CARE_PROVIDER_SITE_OTHER): Payer: Self-pay | Admitting: Family Medicine

## 2019-07-03 ENCOUNTER — Other Ambulatory Visit: Payer: Self-pay

## 2019-07-03 DIAGNOSIS — M542 Cervicalgia: Secondary | ICD-10-CM

## 2019-07-03 DIAGNOSIS — R590 Localized enlarged lymph nodes: Secondary | ICD-10-CM | POA: Insufficient documentation

## 2019-07-03 DIAGNOSIS — M5413 Radiculopathy, cervicothoracic region: Secondary | ICD-10-CM

## 2019-07-03 DIAGNOSIS — M62838 Other muscle spasm: Secondary | ICD-10-CM

## 2019-07-03 DIAGNOSIS — R293 Abnormal posture: Secondary | ICD-10-CM
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
## 2019-07-03 NOTE — Assessment & Plan Note (Signed)
Found incidentally on MRI. No recent illness and not currently symptomatic with any viral complaints. -CBC and differential and smear.
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
## 2019-07-04 LAB — CBC WITH DIFFERENTIAL
Basophils Absolute: 0.1 10*3/uL (ref 0.0–0.2)
Basos: 1 %
EOS (ABSOLUTE): 0.2 10*3/uL (ref 0.0–0.4)
Eos: 3 %
Hematocrit: 43.8 % (ref 37.5–51.0)
Hemoglobin: 15 g/dL (ref 13.0–17.7)
Immature Grans (Abs): 0 10*3/uL (ref 0.0–0.1)
Immature Granulocytes: 0 %
Lymphocytes Absolute: 3.7 10*3/uL — ABNORMAL HIGH (ref 0.7–3.1)
Lymphs: 52 %
MCH: 28.6 pg (ref 26.6–33.0)
MCHC: 34.2 g/dL (ref 31.5–35.7)
MCV: 84 fL (ref 79–97)
Monocytes Absolute: 0.7 10*3/uL (ref 0.1–0.9)
Monocytes: 9 %
Neutrophils Absolute: 2.5 10*3/uL (ref 1.4–7.0)
Neutrophils: 35 %
RBC: 5.24 x10E6/uL (ref 4.14–5.80)
RDW: 14.1 % (ref 11.6–15.4)
WBC: 7.1 10*3/uL (ref 3.4–10.8)

## 2019-07-05 ENCOUNTER — Other Ambulatory Visit: Payer: Self-pay

## 2019-07-05 ENCOUNTER — Encounter: Payer: Self-pay | Admitting: Physical Therapy

## 2019-07-05 ENCOUNTER — Ambulatory Visit: Payer: Self-pay | Admitting: Physical Therapy

## 2019-07-05 DIAGNOSIS — M542 Cervicalgia: Secondary | ICD-10-CM

## 2019-07-05 DIAGNOSIS — R293 Abnormal posture: Secondary | ICD-10-CM

## 2019-07-05 DIAGNOSIS — M62838 Other muscle spasm: Secondary | ICD-10-CM

## 2019-07-05 DIAGNOSIS — M5413 Radiculopathy, cervicothoracic region: Secondary | ICD-10-CM
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
## 2019-07-06 ENCOUNTER — Telehealth: Payer: Self-pay | Admitting: Family Medicine

## 2019-07-06 LAB — PATHOLOGIST SMEAR REVIEW
Basophils Absolute: 0 10*3/uL (ref 0.0–0.2)
Basos: 1 %
EOS (ABSOLUTE): 0.2 10*3/uL (ref 0.0–0.4)
Eos: 3 %
Hematocrit: 44 % (ref 37.5–51.0)
Hemoglobin: 15.1 g/dL (ref 13.0–17.7)
Immature Grans (Abs): 0 10*3/uL (ref 0.0–0.1)
Immature Granulocytes: 0 %
Lymphocytes Absolute: 3.5 10*3/uL — ABNORMAL HIGH (ref 0.7–3.1)
Lymphs: 53 %
MCH: 28.8 pg (ref 26.6–33.0)
MCHC: 34.3 g/dL (ref 31.5–35.7)
MCV: 84 fL (ref 79–97)
Monocytes Absolute: 0.6 10*3/uL (ref 0.1–0.9)
Monocytes: 8 %
Neutrophils Absolute: 2.4 10*3/uL (ref 1.4–7.0)
Neutrophils: 35 %
Platelets: 224 10*3/uL (ref 150–450)
RBC: 5.25 x10E6/uL (ref 4.14–5.80)
RDW: 14.3 % (ref 11.6–15.4)
WBC: 6.8 10*3/uL (ref 3.4–10.8)

## 2019-07-06 NOTE — Telephone Encounter (Signed)
Spoke with patient about results. Will repeat cbc in a month or two.   Myra Rude, MD [facility] 07/06/2019, 12:20 PM

## 2019-07-10 ENCOUNTER — Ambulatory Visit: Payer: Self-pay | Admitting: Physical Therapy

## 2019-07-10 ENCOUNTER — Encounter: Payer: Self-pay | Admitting: Physical Therapy

## 2019-07-10 ENCOUNTER — Other Ambulatory Visit: Payer: Self-pay

## 2019-07-10 DIAGNOSIS — R293 Abnormal posture: Secondary | ICD-10-CM

## 2019-07-10 DIAGNOSIS — M62838 Other muscle spasm: Secondary | ICD-10-CM

## 2019-07-10 DIAGNOSIS — M5413 Radiculopathy, cervicothoracic region: Secondary | ICD-10-CM

## 2019-07-10 DIAGNOSIS — M542 Cervicalgia: Secondary | ICD-10-CM
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
## 2019-07-10 NOTE — Patient Instructions (Signed)
    Home exercise program created by Jones Viviani, PT.  For questions, please contact Zeya Balles via phone at [phone] or email at [email or web address]  [facility] [address]  [address] [place], [place], [address] Phone: [phone]   Fax:  [phone]    

## 2019-07-12 ENCOUNTER — Ambulatory Visit: Payer: Self-pay

## 2019-07-12 ENCOUNTER — Other Ambulatory Visit: Payer: Self-pay

## 2019-07-12 DIAGNOSIS — M5413 Radiculopathy, cervicothoracic region: Secondary | ICD-10-CM

## 2019-07-12 DIAGNOSIS — M542 Cervicalgia: Secondary | ICD-10-CM

## 2019-07-12 DIAGNOSIS — M62838 Other muscle spasm: Secondary | ICD-10-CM

## 2019-07-12 DIAGNOSIS — R293 Abnormal posture: Secondary | ICD-10-CM
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
## 2019-07-18 ENCOUNTER — Ambulatory Visit: Payer: Self-pay

## 2019-07-19 ENCOUNTER — Other Ambulatory Visit: Payer: Self-pay

## 2019-07-19 ENCOUNTER — Ambulatory Visit: Payer: Self-pay | Attending: Family Medicine

## 2019-07-19 DIAGNOSIS — R293 Abnormal posture: Secondary | ICD-10-CM

## 2019-07-19 DIAGNOSIS — M5413 Radiculopathy, cervicothoracic region: Secondary | ICD-10-CM

## 2019-07-19 DIAGNOSIS — M542 Cervicalgia: Secondary | ICD-10-CM

## 2019-07-19 DIAGNOSIS — M62838 Other muscle spasm: Secondary | ICD-10-CM
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
## 2019-07-24 ENCOUNTER — Ambulatory Visit: Payer: Self-pay

## 2019-07-24 ENCOUNTER — Other Ambulatory Visit: Payer: Self-pay

## 2019-07-24 DIAGNOSIS — M62838 Other muscle spasm: Secondary | ICD-10-CM

## 2019-07-24 DIAGNOSIS — M5413 Radiculopathy, cervicothoracic region: Secondary | ICD-10-CM

## 2019-07-24 DIAGNOSIS — R293 Abnormal posture: Secondary | ICD-10-CM

## 2019-07-24 DIAGNOSIS — M542 Cervicalgia: Secondary | ICD-10-CM
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
## 2019-07-26 ENCOUNTER — Ambulatory Visit: Payer: Self-pay | Admitting: Physical Therapy

## 2019-07-26 ENCOUNTER — Other Ambulatory Visit: Payer: Self-pay

## 2019-07-26 ENCOUNTER — Encounter: Payer: Self-pay | Admitting: Physical Therapy

## 2019-07-26 DIAGNOSIS — M542 Cervicalgia: Secondary | ICD-10-CM

## 2019-07-26 DIAGNOSIS — R293 Abnormal posture: Secondary | ICD-10-CM

## 2019-07-26 DIAGNOSIS — M5413 Radiculopathy, cervicothoracic region: Secondary | ICD-10-CM

## 2019-07-26 DIAGNOSIS — M62838 Other muscle spasm: Secondary | ICD-10-CM

## 2019-07-26 NOTE — Patient Instructions (Signed)
TENS UNIT  This is helpful for muscle pain and spasm.   Search and Purchase a TENS 7000 2nd edition at www.tenspros.com or www.amazon.com  (It should be less than $30)     TENS unit instructions:   Do not shower or bathe with the unit on  Turn the unit off before removing electrodes or batteries  If the electrodes lose stickiness add a drop of water to the electrodes after they are disconnected from the unit and place on plastic sheet. If you continued to have difficulty, call the TENS unit company to purchase more electrodes.  Do not apply lotion on the skin area prior to use. Make sure the skin is clean and dry as this will help prolong the life of the electrodes.  After use, always check skin for unusual red areas, rash or other skin difficulties. If there are any skin problems, does not apply electrodes to the same area.  Never remove the electrodes from the unit by pulling the wires.  Do not use the TENS unit or electrodes other than as directed.  Do not change electrode placement without consulting your therapist or physician.  Keep 2 fingers with between each electrode.   TENS stands for Transcutaneous Electrical Nerve Stimulation. In other words, electrical impulses are allowed to pass through the skin in order to excite a nerve.   Purpose and Use of TENS:  TENS is a method used to manage acute and chronic pain without the use of drugs. It has been effective in managing pain associated with surgery, sprains, strains, trauma, rheumatoid arthritis, and neuralgias. It is a non-addictive, low risk, and non-invasive technique used to control pain. It is not, by any means, a curative form of treatment.   How TENS Works:  Most TENS units are a [employer] unit powered by one 9 volt battery. Attached to the outside of the unit are two lead wires where two pins and/or snaps connect on each wire. All units come with a set of four reusable pads or electrodes. These are placed  on the skin surrounding the area involved. By inserting the leads into  the pads, the electricity can pass from the unit making the circuit complete.  As the intensity is turned up slowly, the electrical current enters the body from the electrodes through the skin to the surrounding nerve fibers. This triggers the release of hormones from within the body. These hormones contain pain relievers. By increasing the circulation of these hormones, the person's pain may be lessened. It is also believed that the electrical stimulation itself helps to block the pain messages being sent to the brain, thus also decreasing the body's perception of pain.   Hazards:  TENS units are NOT to be used by patients with PACEMAKERS, DEFIBRILLATORS, DIABETIC PUMPS, PREGNANT WOMEN, and patients with SEIZURE DISORDERS.  TENS units are NOT to be used over the heart, throat, brain, or spinal cord.  One of the major side effects from the TENS unit may be skin irritation. Some people may develop a rash if they are sensitive to the materials used in the electrodes or the connecting wires.   Wear the unit for 30-40 minutes, 3-4x/day as needed.   Avoid overuse due the body getting used to the stem making it not as effective over time.
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
## 2019-07-31 ENCOUNTER — Ambulatory Visit: Payer: Self-pay | Admitting: Physical Therapy

## 2019-07-31 ENCOUNTER — Other Ambulatory Visit: Payer: Self-pay

## 2019-07-31 ENCOUNTER — Encounter: Payer: Self-pay | Admitting: Physical Therapy

## 2019-07-31 DIAGNOSIS — M62838 Other muscle spasm: Secondary | ICD-10-CM

## 2019-07-31 DIAGNOSIS — M542 Cervicalgia: Secondary | ICD-10-CM

## 2019-07-31 DIAGNOSIS — M5413 Radiculopathy, cervicothoracic region: Secondary | ICD-10-CM

## 2019-07-31 DIAGNOSIS — R293 Abnormal posture: Secondary | ICD-10-CM
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
## 2019-08-07 ENCOUNTER — Ambulatory Visit: Payer: Self-pay | Admitting: Physical Therapy

## 2019-08-07 ENCOUNTER — Encounter: Payer: Self-pay | Admitting: Physical Therapy

## 2019-08-07 ENCOUNTER — Other Ambulatory Visit: Payer: Self-pay

## 2019-08-07 DIAGNOSIS — M62838 Other muscle spasm: Secondary | ICD-10-CM

## 2019-08-07 DIAGNOSIS — M542 Cervicalgia: Secondary | ICD-10-CM

## 2019-08-07 DIAGNOSIS — R293 Abnormal posture: Secondary | ICD-10-CM

## 2019-08-07 DIAGNOSIS — M5413 Radiculopathy, cervicothoracic region: Secondary | ICD-10-CM
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
## 2019-10-08 ENCOUNTER — Telehealth: Payer: Self-pay | Admitting: Family Medicine

## 2019-10-08 DIAGNOSIS — R590 Localized enlarged lymph nodes: Secondary | ICD-10-CM

## 2019-10-08 NOTE — Telephone Encounter (Signed)
Patient calling back to have orders placed for repeat CBC and pathologist smear. He also asked if he can have an HIV test ordered as well.

## 2019-10-10 ENCOUNTER — Other Ambulatory Visit: Payer: Self-pay | Admitting: Family Medicine

## 2019-10-12 ENCOUNTER — Telehealth: Payer: Self-pay | Admitting: Family Medicine

## 2019-10-12 NOTE — Telephone Encounter (Signed)
Patient requesting a call back as soon as possible to discuss lab results

## 2019-10-12 NOTE — Telephone Encounter (Signed)
Spoke with patient about his result. Informed that the NP for infectious disease will bet getting in touch with him. Offered him to speak with me further if he had more questions.   Myra Rude, MD [facility] 10/12/2019, 10:12 AM

## 2019-10-13 ENCOUNTER — Other Ambulatory Visit: Payer: Self-pay

## 2019-10-13 ENCOUNTER — Encounter (HOSPITAL_COMMUNITY): Payer: Self-pay

## 2019-10-13 ENCOUNTER — Inpatient Hospital Stay (HOSPITAL_COMMUNITY)
Admission: EM | Admit: 2019-10-13 | Discharge: 2019-10-14 | DRG: 918 | Disposition: A | Discharge status: Other Acute Inpatient Hospital | Payer: Self-pay | Attending: Pulmonary Disease | Admitting: Pulmonary Disease

## 2019-10-13 DIAGNOSIS — F410 Panic disorder [episodic paroxysmal anxiety] without agoraphobia: Secondary | ICD-10-CM | POA: Diagnosis present

## 2019-10-13 DIAGNOSIS — R0902 Hypoxemia: Secondary | ICD-10-CM | POA: Diagnosis present

## 2019-10-13 DIAGNOSIS — E785 Hyperlipidemia, unspecified: Secondary | ICD-10-CM | POA: Diagnosis present

## 2019-10-13 DIAGNOSIS — Z79899 Other long term (current) drug therapy: Secondary | ICD-10-CM

## 2019-10-13 DIAGNOSIS — T401X1A Poisoning by heroin, accidental (unintentional), initial encounter: Secondary | ICD-10-CM | POA: Diagnosis present

## 2019-10-13 DIAGNOSIS — Z86718 Personal history of other venous thrombosis and embolism: Secondary | ICD-10-CM

## 2019-10-13 DIAGNOSIS — G4700 Insomnia, unspecified: Secondary | ICD-10-CM | POA: Diagnosis present

## 2019-10-13 DIAGNOSIS — F332 Major depressive disorder, recurrent severe without psychotic features: Secondary | ICD-10-CM | POA: Diagnosis present

## 2019-10-13 DIAGNOSIS — K219 Gastro-esophageal reflux disease without esophagitis: Secondary | ICD-10-CM | POA: Diagnosis present

## 2019-10-13 DIAGNOSIS — Z21 Asymptomatic human immunodeficiency virus [HIV] infection status: Secondary | ICD-10-CM | POA: Diagnosis present

## 2019-10-13 DIAGNOSIS — T401X4A Poisoning by heroin, undetermined, initial encounter: Secondary | ICD-10-CM

## 2019-10-13 DIAGNOSIS — Z20822 Contact with and (suspected) exposure to covid-19: Secondary | ICD-10-CM | POA: Diagnosis present

## 2019-10-13 DIAGNOSIS — Z86711 Personal history of pulmonary embolism: Secondary | ICD-10-CM

## 2019-10-13 DIAGNOSIS — F909 Attention-deficit hyperactivity disorder, unspecified type: Secondary | ICD-10-CM | POA: Diagnosis present

## 2019-10-13 DIAGNOSIS — T40601A Poisoning by unspecified narcotics, accidental (unintentional), initial encounter: Secondary | ICD-10-CM | POA: Diagnosis present

## 2019-10-13 DIAGNOSIS — T401X2A Poisoning by heroin, intentional self-harm, initial encounter: Principal | ICD-10-CM | POA: Diagnosis present

## 2019-10-13 DIAGNOSIS — T401X1S Poisoning by heroin, accidental (unintentional), sequela: Secondary | ICD-10-CM

## 2019-10-13 LAB — COMPREHENSIVE METABOLIC PANEL
ALT: 42 U/L (ref 0–44)
AST: 42 U/L — ABNORMAL HIGH (ref 15–41)
Albumin: 4.1 g/dL (ref 3.5–5.0)
Alkaline Phosphatase: 66 U/L (ref 38–126)
Anion gap: 11 (ref 5–15)
BUN: 13 mg/dL (ref 6–20)
CO2: 23 mmol/L (ref 22–32)
Calcium: 8.8 mg/dL — ABNORMAL LOW (ref 8.9–10.3)
Chloride: 106 mmol/L (ref 98–111)
Creatinine, Ser: 1.08 mg/dL (ref 0.61–1.24)
GFR calc Af Amer: >60 mL/min (ref >60)
GFR calc non Af Amer: >60 mL/min (ref >60)
Glucose, Bld: 128 mg/dL — ABNORMAL HIGH (ref 70–99)
Potassium: 4.6 mmol/L (ref 3.5–5.1)
Sodium: 140 mmol/L (ref 135–145)
Total Bilirubin: 0.7 mg/dL (ref 0.3–1.2)
Total Protein: 7.7 g/dL (ref 6.5–8.1)

## 2019-10-13 LAB — PATHOLOGIST SMEAR REVIEW
Basophils Absolute: 0 10*3/uL (ref 0.0–0.2)
Basos: 1 %
EOS (ABSOLUTE): 0.1 10*3/uL (ref 0.0–0.4)
Eos: 2 %
Hematocrit: 41 % (ref 37.5–51.0)
Hemoglobin: 14.4 g/dL (ref 13.0–17.7)
Immature Grans (Abs): 0 10*3/uL (ref 0.0–0.1)
Immature Granulocytes: 0 %
Lymphocytes Absolute: 2.7 10*3/uL (ref 0.7–3.1)
Lymphs: 45 %
MCH: 29.3 pg (ref 26.6–33.0)
MCHC: 35.1 g/dL (ref 31.5–35.7)
MCV: 84 fL (ref 79–97)
Monocytes Absolute: 0.5 10*3/uL (ref 0.1–0.9)
Monocytes: 9 %
Neutrophils Absolute: 2.6 10*3/uL (ref 1.4–7.0)
Neutrophils: 43 %
Path Rev PLTs: NORMAL
Path Rev RBC: NORMAL
Path Rev WBC: NORMAL
Platelets: 224 10*3/uL (ref 150–450)
RBC: 4.91 x10E6/uL (ref 4.14–5.80)
RDW: 14.6 % (ref 11.6–15.4)
WBC: 6 10*3/uL (ref 3.4–10.8)

## 2019-10-13 LAB — ETHANOL: Alcohol, Ethyl (B): 37 mg/dL — ABNORMAL HIGH (ref <10)

## 2019-10-13 LAB — CBC WITH DIFFERENTIAL/PLATELET
Abs Immature Granulocytes: 0.09 10*3/uL — ABNORMAL HIGH (ref 0.00–0.07)
Basophils Absolute: 0 10*3/uL (ref 0.0–0.1)
Basophils Relative: 0 %
Eosinophils Absolute: 0.1 10*3/uL (ref 0.0–0.5)
Eosinophils Relative: 1 %
HCT: 46.3 % (ref 39.0–52.0)
Hemoglobin: 15.2 g/dL (ref 13.0–17.0)
Immature Granulocytes: 1 %
Lymphocytes Relative: 15 %
Lymphs Abs: 1.6 10*3/uL (ref 0.7–4.0)
MCH: 29.1 pg (ref 26.0–34.0)
MCHC: 32.8 g/dL (ref 30.0–36.0)
MCV: 88.5 fL (ref 80.0–100.0)
Monocytes Absolute: 0.7 10*3/uL (ref 0.1–1.0)
Monocytes Relative: 6 %
Neutro Abs: 8.2 10*3/uL — ABNORMAL HIGH (ref 1.7–7.7)
Neutrophils Relative %: 77 %
Platelets: 230 10*3/uL (ref 150–400)
RBC: 5.23 MIL/uL (ref 4.22–5.81)
RDW: 14 % (ref 11.5–15.5)
WBC: 10.7 10*3/uL — ABNORMAL HIGH (ref 4.0–10.5)
nRBC: 0 % (ref 0.0–0.2)

## 2019-10-13 LAB — RAPID URINE DRUG SCREEN, HOSP PERFORMED
Amphetamines: NOT DETECTED
Barbiturates: NOT DETECTED
Benzodiazepines: NOT DETECTED
Cocaine: NOT DETECTED
Opiates: NOT DETECTED
Tetrahydrocannabinol: NOT DETECTED

## 2019-10-13 LAB — RESPIRATORY PANEL BY RT PCR (FLU A&B, COVID)
Influenza A by PCR: NEGATIVE
Influenza B by PCR: NEGATIVE
SARS Coronavirus 2 by RT PCR: NEGATIVE

## 2019-10-13 LAB — CBG MONITORING, ED: Glucose-Capillary: 143 mg/dL — ABNORMAL HIGH (ref 70–99)

## 2019-10-13 LAB — HIV 1/2 AB DIFFERENTIATION
HIV 1 Ab: POSITIVE — AB
HIV 2 Ab: NEGATIVE
NOTE (HIV CONF MULTIP: POSITIVE — AB

## 2019-10-13 LAB — HIV ANTIBODY (ROUTINE TESTING W REFLEX): HIV Screen 4th Generation wRfx: REACTIVE — AB

## 2019-10-13 LAB — SALICYLATE LEVEL: Salicylate Lvl: <7 mg/dL — ABNORMAL LOW (ref 7.0–30.0)

## 2019-10-13 LAB — ACETAMINOPHEN LEVEL: Acetaminophen (Tylenol), Serum: <10 ug/mL — ABNORMAL LOW (ref 10–30)

## 2019-10-13 MED ORDER — ONDANSETRON HCL 4 MG/2ML IJ SOLN
4.0000 mg | Freq: Four times a day (QID) | INTRAMUSCULAR | Status: DC | PRN
  Administered 2019-10-13 (×3): 4 mg via INTRAVENOUS
  Filled 2019-10-13 (×3): qty 2

## 2019-10-13 MED ORDER — LORAZEPAM 2 MG/ML IJ SOLN
1.0000 mg | Freq: Once | INTRAMUSCULAR | Status: AC
  Administered 2019-10-13: 1 mg via INTRAVENOUS
  Filled 2019-10-13: qty 1

## 2019-10-13 MED ORDER — ONDANSETRON HCL 4 MG/2ML IJ SOLN
4.0000 mg | Freq: Once | INTRAMUSCULAR | Status: AC
  Administered 2019-10-13: 4 mg via INTRAVENOUS
  Filled 2019-10-13: qty 2

## 2019-10-13 MED ORDER — LACTATED RINGERS IV SOLN: INTRAVENOUS | Status: AC

## 2019-10-13 MED ORDER — NALOXONE HCL 4 MG/10ML IJ SOLN
0.5000 mg/h | INTRAVENOUS | Status: DC
  Administered 2019-10-13: 0.5 mg/h via INTRAVENOUS
  Filled 2019-10-13 (×2): qty 10

## 2019-10-13 MED ORDER — NICOTINE POLACRILEX 2 MG MT GUM
2.0000 mg | CHEWING_GUM | OROMUCOSAL | Status: DC | PRN
  Administered 2019-10-13 (×2): 2 mg via ORAL
  Filled 2019-10-13 (×2): qty 1

## 2019-10-13 MED ORDER — NALOXONE HCL 0.4 MG/ML IJ SOLN
0.4000 mg | Freq: Once | INTRAMUSCULAR | Status: AC
  Administered 2019-10-13: 0.4 mg via INTRAVENOUS
  Filled 2019-10-13: qty 1

## 2019-10-13 MED ORDER — NICOTINE 21 MG/24HR TD PT24
21.0000 mg | MEDICATED_PATCH | Freq: Every day | TRANSDERMAL | Status: DC
  Administered 2019-10-13: 21 mg via TRANSDERMAL
  Filled 2019-10-13: qty 1

## 2019-10-13 MED ORDER — TRAZODONE HCL 100 MG PO TABS: 100.0000 mg | ORAL_TABLET | Freq: Every evening | ORAL | Status: DC | PRN

## 2019-10-13 MED ORDER — NALOXONE HCL 0.4 MG/ML IJ SOLN
INTRAMUSCULAR | Status: AC
  Administered 2019-10-13: 0.4 mg via INTRAVENOUS
  Filled 2019-10-13: qty 1

## 2019-10-13 NOTE — Consult Note (Signed)
[facility] Face-to-Face Psychiatry Consult   Reason for Consult:  Suicide attempt Referring Physician:  EDP Patient Identification: Jimmy Fisher MRN:  [id number] Principal Diagnosis: Major depressive disorder, recurrent severe without psychotic features (HCC) Diagnosis:  Principal Problem:   Major depressive disorder, recurrent severe without psychotic features (HCC) Active Problems:   Opiate overdose (HCC)   Heroin overdose (HCC)  Total Time spent with patient: 1 hour  Subjective:   Jimmy Fisher is a 39 y.o. male patient admitted with suicide attempt.  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  Reports, "I was trying to end the pain."  When asked about the overdose.  "I have been suicidal for the past six months, miserable with my life, my marriage, my job."  He and his wife have been living separately in the home for the past 6-8 months.   He is concerned about his wife's potential contraction of HIV as they have had sex four times in the past 8 months, worried he may have had a high viral load making her more susceptible.  High level of depression and anxiety, denies panic attacks.  Panic attacks, twice in his life, two years ago.  Does not currently taking psychiatric medications.  He has been on Paxil and Risperdal in the past, "I felt like a robot, I am not taking that stuff."  Poor sleep initiation, maintenance is good.  Appetite is decreased.  Past history per patient of ADHD, depression, anxiety.  Opiate use disorder, no use for 8 years until 8 months ago with one relapse.  Reports he used a needle from someone and feels this is where he contracted HIV.  Denies any use since then until last night.  Denies he was trying to kill himself, "I was trying to end the pain."  This is not what he said on arrival to the ED and not what his wife reports, who told the RN that he told her he was trying to kill  himself.  Denies withdrawal symptoms/cravings.  No hallucinations, paranoia, homicidal ideations, or mania symptoms.  Inpatient psychiatric admission warranted.  Past Psychiatric History: depression, anxiety, opiate use disorder  Risk to Self:  yes Risk to Others:  none Prior Inpatient Therapy:  denies Prior Outpatient Therapy:  yes  Past Medical History:  Past Medical History:  Diagnosis Date  . DVT (deep venous thrombosis) (HCC)   . GERD (gastroesophageal reflux disease)   . Hiatal hernia   . Hypercholesteremia   . Pulmonary embolism (HCC)    History reviewed. No pertinent surgical history. Family History: No family history on file. Family Psychiatric  History: denies Social History:  Social History   Substance and Sexual Activity  Alcohol Use No     Social History   Substance and Sexual Activity  Drug Use No    Social History   Socioeconomic History  . Marital status: Married    Spouse name: Not on file  . Number of children: Not on file  . Years of education: Not on file  . Highest education level: Not on file  Occupational History  . Not on file  Tobacco Use  . Smoking status: Never Smoker  . Smokeless tobacco: Never Used  Substance and Sexual Activity  . Alcohol use: No  . Drug use: No  . Sexual activity: Not on file  Other Topics Concern  . Not on file  Social History Narrative  .  Not on file   Social Determinants of Health   Financial Resource Strain:   . Difficulty of Paying Living Expenses:   Food Insecurity:   . Worried About Programme researcher, broadcasting/film/videounning Out of Food in the Last Year:   . Baristaan Out of Food in the Last Year:   Transportation Needs:   . Freight forwarderLack of Transportation (Medical):   Marland Kitchen. Lack of Transportation (Non-Medical):   Physical Activity:   . Days of Exercise per Week:   . Minutes of Exercise per Session:   Stress:   . Feeling of Stress :   Social Connections:   . Frequency of Communication with Friends and Family:   . Frequency of Social Gatherings  with Friends and Family:   . Attends Religious Services:   . Active Member of Clubs or Organizations:   . Attends BankerClub or Organization Meetings:   Marland Kitchen. Marital Status:    Additional Social History:    Allergies:  No Known Allergies  Labs:  Results for orders placed or performed during the hospital encounter of 10/13/19 (from the past 48 hour(s))  CBG monitoring, ED     Status: Abnormal   Collection Time: 10/13/19 12:48 AM  Result Value Ref Range   Glucose-Capillary 143 (H) 70 - 99 mg/dL    Comment: Glucose reference range applies only to samples taken after fasting for at least 8 hours.  Comprehensive metabolic panel     Status: Abnormal   Collection Time: 10/13/19  1:32 AM  Result Value Ref Range   Sodium 140 135 - 145 mmol/L   Potassium 4.6 3.5 - 5.1 mmol/L   Chloride 106 98 - 111 mmol/L   CO2 23 22 - 32 mmol/L   Glucose, Bld 128 (H) 70 - 99 mg/dL    Comment: Glucose reference range applies only to samples taken after fasting for at least 8 hours.   BUN 13 6 - 20 mg/dL   Creatinine, Ser 1.611.08 0.61 - 1.24 mg/dL   Calcium 8.8 (L) 8.9 - 10.3 mg/dL   Total Protein 7.7 6.5 - 8.1 g/dL   Albumin 4.1 3.5 - 5.0 g/dL   AST 42 (H) 15 - 41 U/L   ALT 42 0 - 44 U/L   Alkaline Phosphatase 66 38 - 126 U/L   Total Bilirubin 0.7 0.3 - 1.2 mg/dL   GFR calc non Af Amer >60 >60 mL/min   GFR calc Af Amer >60 >60 mL/min   Anion gap 11 5 - 15    Comment: Performed at [facility], [address] [place], [place] [address]  Salicylate level     Status: Abnormal   Collection Time: 10/13/19  1:32 AM  Result Value Ref Range   Salicylate Lvl <7.0 (L) 7.0 - 30.0 mg/dL    Comment: Performed at [facility], [address] [place], [place] [address]  Acetaminophen level     Status: Abnormal   Collection Time: 10/13/19  1:32 AM  Result Value Ref Range   Acetaminophen (Tylenol), Serum <10 (L) 10 - 30 ug/mL    Comment: (NOTE) Therapeutic concentrations vary  significantly. A range of 10-30 ug/mL  may be an effective concentration for many patients. However, some  are best treated at concentrations outside of this range. Acetaminophen concentrations >150 ug/mL at 4 hours after ingestion  and >50 ug/mL at 12 hours after ingestion are often associated with  toxic reactions. Performed at [facility], [address] [place], [place] [address]  Ethanol     Status: Abnormal   Collection Time: 10/13/19  1:32 AM  Result Value Ref Range   Alcohol, Ethyl (B) 37 (H) <10 mg/dL    Comment: (NOTE) Lowest detectable limit for serum alcohol is 10 mg/dL. For medical purposes only. Performed at [facility], [address], [place], [place] [address]   CBC WITH DIFFERENTIAL     Status: Abnormal   Collection Time: 10/13/19  1:32 AM  Result Value Ref Range   WBC 10.7 (H) 4.0 - 10.5 K/uL   RBC 5.23 4.22 - 5.81 MIL/uL   Hemoglobin 15.2 13.0 - 17.0 g/dL   HCT 75.1 70.0 - 17.4 %   MCV 88.5 80.0 - 100.0 fL   MCH 29.1 26.0 - 34.0 pg   MCHC 32.8 30.0 - 36.0 g/dL   RDW 94.4 96.7 - 59.1 %   Platelets 230 150 - 400 K/uL   nRBC 0.0 0.0 - 0.2 %   Neutrophils Relative % 77 %   Neutro Abs 8.2 (H) 1.7 - 7.7 K/uL   Lymphocytes Relative 15 %   Lymphs Abs 1.6 0.7 - 4.0 K/uL   Monocytes Relative 6 %   Monocytes Absolute 0.7 0.1 - 1.0 K/uL   Eosinophils Relative 1 %   Eosinophils Absolute 0.1 0.0 - 0.5 K/uL   Basophils Relative 0 %   Basophils Absolute 0.0 0.0 - 0.1 K/uL   Immature Granulocytes 1 %   Abs Immature Granulocytes 0.09 (H) 0.00 - 0.07 K/uL    Comment: Performed at [facility], [address], [place], [place] [address]  Respiratory Panel by RT PCR (Flu A&B, Covid) - Nasopharyngeal Swab     Status: None   Collection Time: 10/13/19  2:42 AM   Specimen: Nasopharyngeal Swab  Result Value Ref Range   SARS Coronavirus 2 by RT PCR NEGATIVE NEGATIVE    Comment: (NOTE) SARS-CoV-2 target nucleic  acids are NOT DETECTED. The SARS-CoV-2 RNA is generally detectable in upper respiratoy specimens during the acute phase of infection. The lowest concentration of SARS-CoV-2 viral copies this assay can detect is 131 copies/mL. A negative result does not preclude SARS-Cov-2 infection and should not be used as the sole basis for treatment or other patient management decisions. A negative result may occur with  improper specimen collection/handling, submission of specimen other than nasopharyngeal swab, presence of viral mutation(s) within the areas targeted by this assay, and inadequate number of viral copies (<131 copies/mL). A negative result must be combined with clinical observations, patient history, and epidemiological information. The expected result is Negative. Fact Sheet for Patients:  https://www.moore.com/ Fact Sheet for Healthcare Providers:  https://www.young.biz/ This test is not yet ap proved or cleared by the [place] FDA and  has been authorized for detection and/or diagnosis of SARS-CoV-2 by FDA under an Emergency Use Authorization (EUA). This EUA will remain  in effect (meaning this test can be used) for the duration of the COVID-19 declaration under Section 564(b)(1) of the Act, 21 U.S.C. section 360bbb-3(b)(1), unless the authorization is terminated or revoked sooner.    Influenza A by PCR NEGATIVE NEGATIVE   Influenza B by PCR NEGATIVE NEGATIVE    Comment: (NOTE) The Xpert Xpress SARS-CoV-2/FLU/RSV assay is intended as an aid in  the diagnosis of influenza from Nasopharyngeal swab specimens and  should not be used as a sole basis for treatment. Nasal washings and  aspirates are unacceptable for Xpert Xpress SARS-CoV-2/FLU/RSV  testing. Fact Sheet for Patients: https://www.moore.com/  Fact Sheet for Healthcare Providers: https://www.young.biz/ This test is not yet approved or  cleared by the [place] FDA and  has been authorized for detection and/or diagnosis of SARS-CoV-2 by  FDA under an Emergency Use Authorization (EUA). This EUA will remain  in effect (meaning this test can be used) for the duration of the  Covid-19 declaration under Section 564(b)(1) of the Act, 21  U.S.C. section 360bbb-3(b)(1), unless the authorization is  terminated or revoked. Performed at [facility], [address] [place], [place] [address]   Urine rapid drug screen (hosp performed)     Status: None   Collection Time: 10/13/19  6:06 AM  Result Value Ref Range   Opiates NONE DETECTED NONE DETECTED   Cocaine NONE DETECTED NONE DETECTED   Benzodiazepines NONE DETECTED NONE DETECTED   Amphetamines NONE DETECTED NONE DETECTED   Tetrahydrocannabinol NONE DETECTED NONE DETECTED   Barbiturates NONE DETECTED NONE DETECTED    Comment: (NOTE) DRUG SCREEN FOR MEDICAL PURPOSES ONLY.  IF CONFIRMATION IS NEEDED FOR ANY PURPOSE, NOTIFY LAB WITHIN 5 DAYS. LOWEST DETECTABLE LIMITS FOR URINE DRUG SCREEN Drug Class                     Cutoff (ng/mL) Amphetamine and metabolites    1000 Barbiturate and metabolites    200 Benzodiazepine                 200 Tricyclics and metabolites     300 Opiates and metabolites        300 Cocaine and metabolites        300 THC                            50 Performed at [facility], [address] [place], [place] [address]     Current Facility-Administered Medications  Medication Dose Route Frequency Provider Last Rate Last Admin  . lactated ringers infusion   Intravenous Continuous Salome Holmes, MD 100 mL/hr at 10/13/19 0553 New Bag at 10/13/19 0553  . naloxone HCl (NARCAN) 4 mg in dextrose 5 % 250 mL infusion  0.5 mg/hr Intravenous Continuous Roxy Horseman, PA-C   Stopped at 10/13/19 6378  . nicotine polacrilex (NICORETTE) gum 2 mg  2 mg Oral PRN Olalere, Adewale A, MD   2 mg at 10/13/19 1000  .  ondansetron (ZOFRAN) injection 4 mg  4 mg Intravenous Q6H PRN Salome Holmes, MD   4 mg at 10/13/19 5885   Current Outpatient Medications  Medication Sig Dispense Refill  . acetaminophen (TYLENOL) 500 MG tablet Take 1,000 mg by mouth every 6 (six) hours as needed.    . cetirizine (ZYRTEC) 10 MG tablet Take 10 mg by mouth daily.    Marland Kitchen ibuprofen (ADVIL) 200 MG tablet Take 400 mg by mouth every 6 (six) hours as needed for headache or moderate pain.    Marland Kitchen omeprazole (PRILOSEC) 20 MG capsule Take 20 mg by mouth daily.    . cyclobenzaprine (FEXMID) 7.5 MG tablet Take 1 tablet (7.5 mg total) by mouth 3 (three) times daily as needed for muscle spasms. (Patient not taking: Reported on 05/29/2019) 30 tablet 0  . EPINEPHrine (EPIPEN 2-PAK) 0.3 mg/0.3 mL IJ SOAJ injection Inject 0.3 mLs (0.3 mg total) into the muscle as needed for anaphylaxis. (Patient not taking: Reported on 05/29/2019) 1 each 0  . famotidine (PEPCID) 20 MG tablet Take 1 tablet (20  mg total) by mouth 2 (two) times daily. (Patient not taking: Reported on 10/13/2019) 60 tablet 0  . predniSONE (DELTASONE) 5 MG tablet Take 6 pills for first day, 5 pills second day, 4 pills third day, 3 pills fourth day, 2 pills the fifth day, and 1 pill sixth day. (Patient not taking: Reported on 05/29/2019) 21 tablet 0  . sucralfate (CARAFATE) 1 g tablet Take 1 tablet (1 g total) by mouth 4 (four) times daily -  with meals and at bedtime. (Patient not taking: Reported on 05/29/2019) 120 tablet 0    Musculoskeletal: Strength & Muscle Tone: within normal limits Gait & Station: normal Patient leans: N/A  Psychiatric Specialty Exam: Physical Exam  Nursing note and vitals reviewed. Constitutional: He is oriented to person, place, and time. He appears well-developed and well-nourished.  HENT:  Head: Normocephalic.  Respiratory: Effort normal.  Musculoskeletal:        General: Normal range of motion.     Cervical back: Normal range of motion.  Neurological:  He is alert and oriented to person, place, and time.  Psychiatric: His speech is normal and behavior is normal. His mood appears anxious. Cognition and memory are normal. He expresses impulsivity. He exhibits a depressed mood. He expresses suicidal ideation.    Review of Systems  Psychiatric/Behavioral: Positive for dysphoric mood, sleep disturbance and suicidal ideas. The patient is nervous/anxious.   All other systems reviewed and are negative.   Blood pressure 129/89, pulse 81, temperature (!) 97.4 F (36.3 C), temperature source Oral, resp. rate 14, height 5\' 6"  (1.676 m), weight 83.9 kg, SpO2 97 %.Body mass index is 29.86 kg/m.  General Appearance: Casual  Eye Contact:  Good  Speech:  Normal Rate  Volume:  Normal  Mood:  Anxious and Depressed  Affect:  Congruent  Thought Process:  Coherent and Descriptions of Associations: Intact  Orientation:  Full (Time, Place, and Person)  Thought Content:  Obsessions  Suicidal Thoughts:  Yes.  with intent/plan  Homicidal Thoughts:  No  Memory:  Immediate;   Fair Recent;   Fair Remote;   Fair  Judgement:  Poor  Insight:  Lacking  Psychomotor Activity:  Decreased  Concentration:  Concentration: Fair and Attention Span: Fair  Recall:  [employer] of Knowledge:  Fair  Language:  Good  Akathisia:  No  Handed:  Right  AIMS (if indicated):     Assets:  Housing Leisure Time Physical Health Resilience Social Support Vocational/Educational  ADL's:  Intact  Cognition:  WNL  Sleep:        Treatment Plan Summary: Daily contact with patient to assess and evaluate symptoms and progress in treatment, Medication management and Plan major depressive disorder, recurrent, severe without psychosis:  -Recommend antidepressant , patient refused -Admit to inpatient psychiatric unit  Opiate use disorder: -Continue monitoring withdrawal symptoms  Insomnia: -Started Trazodone 100 mg PRN sleep  Disposition: Recommend psychiatric Inpatient  admission when medically cleared.  Waylan Boga, NP 10/13/2019 11:38 AM

## 2019-10-13 NOTE — Progress Notes (Signed)
Cleared clinically from my perspective for psych inpatient care

## 2019-10-13 NOTE — ED Provider Notes (Signed)
[facility]-EMERGENCY DEPT Provider Note   CSN: [id number] Arrival date & time: 10/13/19  0025     History Chief Complaint  Patient presents with  . Drug Overdose    Jimmy Fisher is a 39 y.o. male.  Patient with PMH of PE, DVT, GERD, HL, presents to the ED with a chief complaint of heroine overdose.  He was sitting on the couch with his wife and was reportedly "chewing a wad" and became apneic.  Per EMS, his wife said that he turned blue.  She called 911 and administered CPR.  He was give 2 mg intranasal narcan and 2 mg IV narcan by EMS.  He responded slowly to the narcan.  He states that he feels itchy and tired now.  He states that he "100% will kill myself, but I have to get my affairs in order." Denies any other complaints.  The history is provided by the patient. No language interpreter was used.       Past Medical History:  Diagnosis Date  . DVT (deep venous thrombosis) (HCC)   . GERD (gastroesophageal reflux disease)   . Hiatal hernia   . Hypercholesteremia   . Pulmonary embolism [facility])     Patient Active Problem List   Diagnosis Date Noted  . Cervical lymphadenopathy 07/03/2019  . Neck pain 05/17/2019    History reviewed. No pertinent surgical history.     No family history on file.  Social History   Tobacco Use  . Smoking status: Never Smoker  . Smokeless tobacco: Never Used  Substance Use Topics  . Alcohol use: No  . Drug use: No    Home Medications Prior to Admission medications   Medication Sig Start Date End Date Taking? Authorizing Provider  acetaminophen (TYLENOL) 500 MG tablet Take 1,000 mg by mouth every 6 (six) hours as needed.   Yes [provider]  cetirizine (ZYRTEC) 10 MG tablet Take 10 mg by mouth daily.   Yes [provider]  ibuprofen (ADVIL) 200 MG tablet Take 400 mg by mouth every 6 (six) hours as needed for headache or moderate pain.   Yes [provider]  omeprazole (PRILOSEC)  20 MG capsule Take 20 mg by mouth daily.   Yes [provider]  cyclobenzaprine (FEXMID) 7.5 MG tablet Take 1 tablet (7.5 mg total) by mouth 3 (three) times daily as needed for muscle spasms. Patient not taking: Reported on 05/29/2019 04/26/19   Jenell Milliner, MD  EPINEPHrine (EPIPEN 2-PAK) 0.3 mg/0.3 mL IJ SOAJ injection Inject 0.3 mLs (0.3 mg total) into the muscle as needed for anaphylaxis. Patient not taking: Reported on 05/29/2019 01/13/19   Pricilla Loveless, MD  famotidine (PEPCID) 20 MG tablet Take 1 tablet (20 mg total) by mouth 2 (two) times daily. Patient not taking: Reported on 10/13/2019 02/04/19   Arthor Captain, PA-C  predniSONE (DELTASONE) 5 MG tablet Take 6 pills for first day, 5 pills second day, 4 pills third day, 3 pills fourth day, 2 pills the fifth day, and 1 pill sixth day. Patient not taking: Reported on 05/29/2019 05/17/19   Myra Rude, MD  sucralfate (CARAFATE) 1 g tablet Take 1 tablet (1 g total) by mouth 4 (four) times daily -  with meals and at bedtime. Patient not taking: Reported on 05/29/2019 02/04/19   Arthor Captain, PA-C    Allergies    Patient has no known allergies.  Review of Systems   Review of Systems  All other systems reviewed  and are negative.   Physical Exam Updated Vital Signs BP 108/68   Pulse 69   Temp (!) 97.4 F (36.3 C) (Oral)   Resp 12   Ht 5\' 6"  (1.676 m)   Wt 83.9 kg   SpO2 93%   BMI 29.86 kg/m   Physical Exam Vitals and nursing note reviewed.  Constitutional:      Appearance: He is well-developed.  HENT:     Head: Normocephalic and atraumatic.  Eyes:     Conjunctiva/sclera: Conjunctivae normal.  Cardiovascular:     Rate and Rhythm: Normal rate and regular rhythm.     Heart sounds: No murmur.  Pulmonary:     Effort: Pulmonary effort is normal. No respiratory distress.     Breath sounds: Normal breath sounds.  Abdominal:     Palpations: Abdomen is soft.     Tenderness: There is no abdominal tenderness.    Musculoskeletal:        General: Normal range of motion.     Cervical back: Neck supple.  Skin:    General: Skin is warm and dry.  Neurological:     Mental Status: He is alert. He is disoriented.     Comments: Intoxicated with heroine  Psychiatric:        Mood and Affect: Mood normal.        Behavior: Behavior normal.    [facility] ED Results / Procedures / Treatments   Labs (all labs ordered are listed, but only abnormal results are displayed) Labs Reviewed  COMPREHENSIVE METABOLIC PANEL - Abnormal; Notable for the following components:      Result Value   Glucose, Bld 128 (*)    Calcium 8.8 (*)    AST 42 (*)    All other components within normal limits  SALICYLATE LEVEL - Abnormal; Notable for the following components:   Salicylate Lvl <3.3 (*)    All other components within normal limits  ACETAMINOPHEN LEVEL - Abnormal; Notable for the following components:   Acetaminophen (Tylenol), Serum <10 (*)    All other components within normal limits  ETHANOL - Abnormal; Notable for the following components:   Alcohol, Ethyl (B) 37 (*)    All other components within normal limits  CBC WITH DIFFERENTIAL/PLATELET - Abnormal; Notable for the following components:   WBC 10.7 (*)    Neutro Abs 8.2 (*)    Abs Immature Granulocytes 0.09 (*)    All other components within normal limits  CBG MONITORING, ED - Abnormal; Notable for the following components:   Glucose-Capillary 143 (*)    All other components within normal limits  RESPIRATORY PANEL BY RT PCR (FLU A&B, COVID)  RAPID URINE DRUG SCREEN, HOSP PERFORMED    EKG EKG Interpretation  Date/Time:  Saturday Oct 13 2019 00:33:58 EDT Ventricular Rate:  85 PR Interval:    QRS Duration: 108 QT Interval:  399 QTC Calculation: 475 R Axis:   -28 Text Interpretation: Sinus rhythm Borderline left axis deviation RSR' in V1 or V2, probably normal variant No significant change since last tracing Confirmed by Pryor Curia [id number])  on 10/13/2019 12:43:30 AM   Radiology No results found.  Procedures .Critical Care Performed by: Montine Circle, PA-C Authorized by: Montine Circle, PA-C   Critical care provider statement:    Critical care time (minutes):  50   Critical care was necessary to treat or prevent imminent or life-threatening deterioration of the following conditions:  [place]   Critical care was time spent personally by  me on the following activities:  Discussions with consultants, evaluation of patient's response to treatment, examination of patient, ordering and performing treatments and interventions, ordering and review of laboratory studies, ordering and review of radiographic studies, pulse oximetry, re-evaluation of patient's condition, obtaining history from patient or surrogate and review of old charts   (including critical care time)  Medications Ordered in ED Medications  naloxone (NARCAN) injection 0.4 mg (0.4 mg Intravenous Given 10/13/19 0044)    ED Course  I have reviewed the triage vital signs and the nursing notes.  Pertinent labs & imaging results that were available during my care of the patient were reviewed by me and considered in my medical decision making (see chart for details).    MDM Rules/Calculators/A&P                      This patient complains of heroine overdose, this involves an extensive number of treatment options, and is a complaint that carries with it a high risk of complications and morbidity.  The differential diagnosis includes accidental overdose, intentional overdose, suicide attempt, coingestants .  Pertinent Labs I ordered, reviewed, and interpreted labs, which included CBC notable for WBC of 10.7, CMP, salicylate, acetaminophen, ethanol.   Medications I ordered medication narcan for respiratory depression in the setting of heroine overdose.  Sources Additional history obtained from EMS. Previous records obtained and reviewed no recent prior visits  for overdose.  Consultants ICU for admission and monitoring due to repeat respiratory depression with hypoxia in the setting of heroine overdose.  Multiple rounds of narcan given and ultimately infusion was started.  Critical Interventions  Narcan for respiratory depression.  Reassessments After the interventions stated above, I reevaluated the patient and found to have had another episode of apnea and will need admission for heroine overdose.    Final Clinical Impression(s) / ED Diagnoses Final diagnoses:  None    Rx / DC Orders ED Discharge Orders    None       Roxy Horseman, PA-C 10/13/19 0426    Ward, Layla Maw, DO 10/13/19 [phone]

## 2019-10-13 NOTE — Progress Notes (Signed)
Patient was seen by critical care this morning  Was placed on Narcan drip this morning-was stopped at 0912  Patient is awake alert interactive Stable hemodynamics No respiratory distress  Agitated about not been able to eat and is distraught about his new diagnosis  States he is not suicidal  He did use heroin in the past but quit over 8 years ago He stated he went to heroin for comfort, just wanted to numb his pain  Hemodynamics is stable Clear breath sounds S1-S2 appreciated  Labs reviewed: Chem-7 within normal limits, CBC within normal limits.  Heroin use Respiratory depression with heroin use  Patient is stable at the present time  He does not need ICU admission  Consult for psychiatry placed Called hospitalist service for possible admission  Nicotine gum ordered May be fed

## 2019-10-13 NOTE — ED Notes (Signed)
Pt admits to provider that "it is inevitable he is going to kill himself he just doesn't have things in order." Pt continues to say this was not an attempt on his life.

## 2019-10-13 NOTE — ED Notes (Signed)
Pt had 2 episodes of emesis at this time

## 2019-10-13 NOTE — ED Notes (Signed)
Pt placed in scrubs. All belongings gathered and placed in secure locker.   2 camo slippers 1 underwear  1 pants 1 shirt

## 2019-10-13 NOTE — ED Provider Notes (Signed)
Pt had a heroin overdose last night which required a narcan drip.  ICU did admit him.  Now, he is awake and alert.  Narcan drip is off.  The pt is no longer appropriate for admission.  He told PA Dahlia Client that he'd kill himself.  Today, he denies SI.  I will have psych see pt.  Pt is now telling psych he did admit to SI to psych.  Pt is medically clear.   Jacalyn Lefevre, MD 10/13/19 1323

## 2019-10-13 NOTE — ED Notes (Signed)
Pt refusing to stay in bed at this time because he is "itchy".

## 2019-10-13 NOTE — H&P (Signed)
NAME:  ARCHIE SHEA, MRN:  [id number], DOB:  10-14-1980, LOS: 0 ADMISSION DATE:  10/13/2019, CONSULTATION DATE:  10/13/19 REFERRING MD:  Roxy Horseman CHIEF COMPLAINT:  Heroine overdose  History of present illness   Mr. JABREEL CHIMENTO is a 39 year old male with history of substance abuse, GERD/hiatal hernia, DVT/PE and hyperlipidemia who presented to the ED after heroine overdose with unresponsiveness.  His wife apparently found him down and apneic, and wife reportedly did CPR before EMS arrival because he "turned blue."  No clear indication he ever loss pulse.  EMS gave him 2mg  intranasal narcan, 1mg  IV and he reportedly woke up after 4 minutes.     Upon arrival to the ED, he became more somnolent and lethargic again with desaturation into the 50s, and was given Narcan 0.4mg  IV x 2 which helped him become more alert before he would get sleepy again, and also had 2 episodes of emesis.  He was thus started on Narcan drip.  The patient tells me he took heroine overdose by snorting purposely after he felt distraught by finding out he was HIV+ today.  He states he saw his result in MyChart before his PCP called him.  His PCP did later called him to inform him, with plans to refer to ID.  The patient admitted to wanting to kill himself tonight. He denies any other drug ingestions.  The patient was seen bedside in ED.  He is moderately somnolent, occasionally dozing off while being interviewed but is easily arousable.  He is otherwise a poor historian at this time given he is constantly falling asleep.     Past Medical History   Past Medical History:  Diagnosis Date  . DVT (deep venous thrombosis) (HCC)   . GERD (gastroesophageal reflux disease)   . Hiatal hernia   . Hypercholesteremia   . Pulmonary embolism (HCC)     Review of Systems:   Unobtainable   Social History   reports that he has never smoked. He has never used smokeless tobacco. He reports that he does not drink  alcohol or use drugs.   Family History   His family history is not on file.   Allergies No Known Allergies   Home Medications  Prior to Admission medications   Medication Sig Start Date End Date Taking? Authorizing Provider  acetaminophen (TYLENOL) 500 MG tablet Take 1,000 mg by mouth every 6 (six) hours as needed.   Yes [provider]  cetirizine (ZYRTEC) 10 MG tablet Take 10 mg by mouth daily.   Yes [provider]  ibuprofen (ADVIL) 200 MG tablet Take 400 mg by mouth every 6 (six) hours as needed for headache or moderate pain.   Yes [provider]  omeprazole (PRILOSEC) 20 MG capsule Take 20 mg by mouth daily.   Yes [provider]  cyclobenzaprine (FEXMID) 7.5 MG tablet Take 1 tablet (7.5 mg total) by mouth 3 (three) times daily as needed for muscle spasms. Patient not taking: Reported on 05/29/2019 04/26/19   05/31/2019, MD  EPINEPHrine (EPIPEN 2-PAK) 0.3 mg/0.3 mL IJ SOAJ injection Inject 0.3 mLs (0.3 mg total) into the muscle as needed for anaphylaxis. Patient not taking: Reported on 05/29/2019 01/13/19   05/31/2019, MD  famotidine (PEPCID) 20 MG tablet Take 1 tablet (20 mg total) by mouth 2 (two) times daily. Patient not taking: Reported on 10/13/2019 02/04/19   12/13/2019, PA-C  predniSONE (DELTASONE) 5 MG tablet Take 6 pills for first  day, 5 pills second day, 4 pills third day, 3 pills fourth day, 2 pills the fifth day, and 1 pill sixth day. Patient not taking: Reported on 05/29/2019 05/17/19   Rosemarie Ax, MD  sucralfate (CARAFATE) 1 g tablet Take 1 tablet (1 g total) by mouth 4 (four) times daily -  with meals and at bedtime. Patient not taking: Reported on 05/29/2019 02/04/19   Margarita Mail, PA-C    Objective   Blood pressure 103/68, pulse 78, temperature (!) 97.4 F (36.3 C), temperature source Oral, resp. rate 11, height 5\' 6"  (1.676 m), weight 83.9 kg, SpO2 96 %.       No intake or output data in the 24 hours ending  10/13/19 0518 Filed Weights   10/13/19 0031  Weight: 83.9 kg   Examination: General: Somnolent, easily arousable, no acute cardiopulm distress HENT: [place]/AT, PERRLA, EOM intact, no nasal discharge Lungs: clear to auscultation, no wheezing/rhonchi or rales Cardiovascular: S1S2 RRR, no m/g/r Abdomen: soft, nontender, NABS Extremities: no cyanosis, clubbing or edema Neuro: somnolent, easily arousable, moves all 4 extremities, no signs of focal weakness   Labs    CBC: Recent Labs  Lab 10/10/19 1626 10/13/19 0132  WBC 6.0 10.7*  NEUTROABS 2.6 8.2*  HGB 14.4 15.2  HCT 41.0 46.3  MCV 84 88.5  PLT 224 782    Basic Metabolic Panel: Recent Labs  Lab 10/13/19 0132  NA 140  K 4.6  CL 106  CO2 23  GLUCOSE 128*  BUN 13  CREATININE 1.08  CALCIUM 8.8*   GFR: Estimated Creatinine Clearance: 92.3 mL/min (by C-G formula based on SCr of 1.08 mg/dL). Recent Labs  Lab 10/10/19 1626 10/13/19 0132  WBC 6.0 10.7*    Liver Function Tests: Recent Labs  Lab 10/13/19 0132  AST 42*  ALT 42  ALKPHOS 66  BILITOT 0.7  PROT 7.7  ALBUMIN 4.1   CBG: Recent Labs  Lab 10/13/19 0048  GLUCAP 956*   Salicy:  < 7 Acet: < 10  Assessment & Plan:  Mr. MCGWIRE DASARO is a 39 year old male with history of polysubstance abuse, GERD/hiatal hernia, DVT/PE and hyperlipidemia who presented to the ED after heroine overdose with unresponsiveness.  He received multiple doses of Narcan and still becoming somnolent after it wears off, thus he was started on Narcan drip.  1. Unresponsiveness 2. Heroine overdose 3. Depression 4. Newly diagnosed HIV    Will admit to ICU for close monitoring  Continue on Narcan drip  1:1 sitter  Call psych consult in AM  PCP arranging ID f/u in the outpatient for his newly diagnosed HIV   Best practice:  Diet:  NPO for now due to mental status Pain/Anxiety/Delirium protocol (if indicated): n/a VAP protocol (if indicated): n/a DVT prophylaxis:   Lovenox GI prophylaxis:  Holding oral meds till more awake Glucose control: none Mobility: advance as tolerated Code Status: Full Family Communication: defer till daytime Disposition: as per psych evaluation  Critical care time: [place]. Reinaldo Berber, MD

## 2019-10-13 NOTE — Progress Notes (Signed)
Patient is clear clinically from a critical care perspective  Further care to be determined by psych evaluation and management plan  Does not need critical care admission

## 2019-10-13 NOTE — ED Triage Notes (Signed)
Snorted unknown amount of heroin. Wife did cpr for a few minutes after pt was found apneic. 2mg  IN and 1mg  IV and pt woke up 4  minutes after. EMS reports passive SI after finding out he was HIV positive today.

## 2019-10-13 NOTE — BH Assessment (Signed)
Assessment Note  Jimmy Fisher is an 39 y.o. male that presents this date with IVC. Per IVC: Respondent overdosed on heroin prior to arrival and required CPR and naloxone. Upon awakening patient reported he wanted to "100 %  kill himself" and he "just wanted to get his affairs in order." Patient denies S/I at the time of assessment. Patient denies any H/I or AVH. Patient denies any previous attempts or gestures at self harm. Patient denies any current psychiatric diagnosis or medication interventions although he reports he has been depressed since his relapse with symptoms to include: guilt and feeling worthless. Patient reports a history of depression and anxiety in the past although cannot recall when he was diagnosed. Patient denies any other mental health symptoms beyond above and  current S/I. Patient reports he has been maintaining his sobriety for 8 years stating he had a previous addiction to heroin. Patient states he relapsed "one time" 8 weeks ago on heroin at which time he shared a needle and just learned yesterday that he had contracted HIV from that event after exhibiting symptoms two weeks prior to that result.. Patient has limited mental health history from chart review. Lord DNP also evaluated patient and noted. "Patient presented to the ED after intentionally overdosing on heroin after learning he is HIV positive prior to the attempt.  Reports, "I was trying to end the pain." When asked about the overdose he stated "I have been suicidal for the past six months, miserable with my life, my marriage, my job." He and his wife have been living separately in the home for the past 6-8 months. He is concerned about his wife's potential contraction of HIV as they have had sex four times in the past 8 months, worried he may have had a high viral load making her more susceptible. Patient reports High level of depression and anxiety, denies panic attacks. Panic attacks, twice in his life, two years ago.  Does not currently taking psychiatric medications. He has been on Paxil and Risperdal in the past, "I felt like a robot, I am not taking that stuff." Poor sleep initiation, maintenance is good.  Appetite is decreased. Past history per patient of ADHD, depression, anxiety. Opiate use disorder, no use for 8 years until 8 months ago with one relapse. Reports he used a needle from someone and feels this is where he contracted HIV. Denies any use since then until last night. Denies he was trying to kill himself, "I was trying to end the pain." This is not what he said on arrival to the ED and not what his wife reports, who told the RN that he told her he was trying to kill himself. Denies withdrawal symptoms/cravings. No hallucinations, paranoia, homicidal ideations, or mania symptoms". Patient is oriented x 4 and presents with a pleasant affect. Patient's thoughts are organized and memory appears to be intact. Patient does not appear to be responding to internal stimuli. Case was staffed with Reita Cliche DNP who recommended a inpatient admission to assist with stabilization.     Diagnosis: F33.2 MDD recurrent without psychotic features, severe, Opioid use   Past Medical History:  Past Medical History:  Diagnosis Date  . DVT (deep venous thrombosis) ([place])   . GERD (gastroesophageal reflux disease)   . Hiatal hernia   . Hypercholesteremia   . Pulmonary embolism ([place])     History reviewed. No pertinent surgical history.  Family History: No family history on file.  Social History:  reports that he has  never smoked. He has never used smokeless tobacco. He reports that he does not drink alcohol or use drugs.  Additional Social History:  Alcohol / Drug Use Pain Medications: See MAR Prescriptions: See MAR Over the Counter: See MAR History of alcohol / drug use?: Yes Longest period of sobriety (when/how long): 8 years Negative Consequences of Use: Personal relationships(Health issues) Withdrawal Symptoms:  (Denies) Substance #1 Name of Substance 1: Heroin 1 - Age of First Use: 23 1 - Amount (size/oz): Varies 1 - Frequency: Varies 1 - Duration: Ongoing 1 - Last Use / Amount: 10/12/19 1 gram  CIWA: CIWA-Ar BP: (!) 108/56 Pulse Rate: 70 COWS:    Allergies: No Known Allergies  Home Medications: (Not in a hospital admission)   OB/GYN Status:  No LMP for male patient.  General Assessment Data [facility] of Assessment: [facility] TTS Assessment: In system Is this a Tele or Face-to-Face Assessment?: Face-to-Face Is this an Initial Assessment or a Re-assessment for this encounter?: Initial Assessment Patient Accompanied by:: N/A Language Other than English: No Living Arrangements: Other (Comment)(With spouse) What gender do you identify as?: Male Marital status: Married Living Arrangements: Spouse/significant other Can pt return to current living arrangement?: Yes Admission Status: Involuntary Petitioner: Police Is patient capable of signing voluntary admission?: Yes Referral Source: Other(Law enforcement) Insurance type: SP  Medical Screening Exam [facility] Walk-in ONLY) Medical Exam completed: Yes  Crisis Care Plan Living Arrangements: Spouse/significant other Legal Guardian: (NA) Name of Psychiatrist: None Name of Therapist: None  Education Status Is patient currently in school?: No Current Grade: (NA) Highest grade of school patient has completed: (NA) Name of school: (NA) Contact person: (NA) IEP information if applicable: (NA) Is the patient employed, unemployed or receiving disability?: Unemployed  Risk to self with the past 6 months Suicidal Ideation: Yes-Currently Present Has patient been a risk to self within the past 6 months prior to admission? : No Suicidal Intent: Yes-Currently Present Has patient had any suicidal intent within the past 6 months prior to admission? : No Is patient at risk for suicide?: Yes Suicidal Plan?: Yes-Currently Present Has patient had any  suicidal plan within the past 6 months prior to admission? : No Specify Current Suicidal Plan: Overdose Access to Means: Yes Specify Access to Suicidal Means: Pt had heroin What has been your use of drugs/alcohol within the last 12 months?: Current use Previous Attempts/Gestures: No How many times?: 0 Other Self Harm Risks: (NA) Triggers for Past Attempts: (NA) Intentional Self Injurious Behavior: None Family Suicide History: No Recent stressful life event(s): Other (Comment)(Recent relapse) Persecutory voices/beliefs?: No Depression: Yes Depression Symptoms: Feeling worthless/self pity Substance abuse history and/or treatment for substance abuse?: No Suicide prevention information given to non-admitted patients: Not applicable  Risk to Others within the past 6 months Homicidal Ideation: No Does patient have any lifetime risk of violence toward others beyond the six months prior to admission? : No Thoughts of Harm to Others: No Current Homicidal Intent: No Current Homicidal Plan: No Access to Homicidal Means: No Identified Victim: NA History of harm to others?: No Assessment of Violence: None Noted Violent Behavior Description: NA Does patient have access to weapons?: No Criminal Charges Pending?: No Does patient have a court date: No Is patient on probation?: No  Psychosis Hallucinations: None noted Delusions: None noted  Mental Status Report Appearance/Hygiene: Unremarkable Eye Contact: Fair Motor Activity: Freedom of movement Speech: Logical/coherent Level of Consciousness: Quiet/awake Mood: Depressed Affect: Appropriate to circumstance Anxiety Level: Moderate Thought Processes: Coherent, Relevant  Judgement: Partial Orientation: Person, Place, Time Obsessive Compulsive Thoughts/Behaviors: None  Cognitive Functioning Concentration: Normal Memory: Recent Intact, Remote Intact Is patient IDD: No Insight: Fair Impulse Control: Poor Appetite: Good Have you had  any weight changes? : No Change Sleep: No Change Total Hours of Sleep: 7 Vegetative Symptoms: None  ADLScreening [facility] Assessment Services) Patient's cognitive ability adequate to safely complete daily activities?: Yes Patient able to express need for assistance with ADLs?: Yes Independently performs ADLs?: Yes (appropriate for developmental age)  Prior Inpatient Therapy Prior Inpatient Therapy: No  Prior Outpatient Therapy Prior Outpatient Therapy: No Does patient have an ACCT team?: No Does patient have Intensive In-House Services?  : No Does patient have [facility] services? : No Does patient have P4CC services?: No  ADL Screening (condition at time of admission) Patient's cognitive ability adequate to safely complete daily activities?: Yes Is the patient deaf or have difficulty hearing?: No Does the patient have difficulty seeing, even when wearing glasses/contacts?: No Does the patient have difficulty concentrating, remembering, or making decisions?: No Patient able to express need for assistance with ADLs?: Yes Does the patient have difficulty dressing or bathing?: No Independently performs ADLs?: Yes (appropriate for developmental age) Does the patient have difficulty walking or climbing stairs?: No Weakness of Legs: None Weakness of Arms/Hands: None  Home Assistive Devices/Equipment Home Assistive Devices/Equipment: None  Therapy Consults (therapy consults require a physician order) PT Evaluation Needed: No OT Evalulation Needed: No SLP Evaluation Needed: No Abuse/Neglect Assessment (Assessment to be complete while patient is alone) Abuse/Neglect Assessment Can Be Completed: Yes Physical Abuse: Denies Verbal Abuse: Denies Sexual Abuse: Denies Exploitation of patient/patient's resources: Denies Self-Neglect: Denies Values / Beliefs Cultural Requests During Hospitalization: None Spiritual Requests During Hospitalization: None Consults Spiritual Care Consult Needed:  No Transition of Care Team Consult Needed: No Advance Directives (For Healthcare) Does Patient Have a Medical Advance Directive?: No Would patient like information on creating a medical advance directive?: No - Patient declined          Disposition: Case was staffed with Shaune Pollack DNP who recommended a inpatient admission to assist with stabilization.     Disposition Initial Assessment Completed for this Encounter: Yes Disposition of Patient: Admit Type of inpatient treatment program: Adult  On Site Evaluation by:   Reviewed with Physician:    Alfredia Ferguson 10/13/2019 1:46 PM

## 2019-10-13 NOTE — BH Assessment (Signed)
[facility] Assessment Progress Note Case was staffed with Lord DNP who recommended a inpatient admission to assist with stabilization.       

## 2019-10-13 NOTE — ED Notes (Signed)
Rob, PA at bedside. 

## 2019-10-13 NOTE — ED Notes (Signed)
Pt encouraged to provide urine sample but pt is very sleepy at this time and states he cannot void for a sample right now.

## 2019-10-13 NOTE — Progress Notes (Signed)
Patient meets criteria for inpatient treatment per Nanine Means, NP. No appropriate beds at [facility] currently. CSW faxed referrals to the following facilities for review:  [facility]   [facility]   [facility]   [facility]   [facility]   [facility]   [facility]   [facility]   [facility]     TTS will continue to seek bed placement.     Ruthann Cancer MSW, [facility] Social Worker Disposition  [facility] Ph: [phone] Fax: [phone] 10/13/2019 2:36 PM

## 2019-10-14 ENCOUNTER — Inpatient Hospital Stay (HOSPITAL_COMMUNITY)
Admission: AD | Admit: 2019-10-14 | Discharge: 2019-10-18 | DRG: 885 | Disposition: A | Discharge status: Home / Self Care | Payer: Federal, State, Local not specified - Other | Source: Intra-hospital | Attending: Psychiatry | Admitting: Psychiatry

## 2019-10-14 ENCOUNTER — Encounter (HOSPITAL_COMMUNITY): Payer: Self-pay | Admitting: Psychiatry

## 2019-10-14 ENCOUNTER — Other Ambulatory Visit: Payer: Self-pay

## 2019-10-14 DIAGNOSIS — Z915 Personal history of self-harm: Secondary | ICD-10-CM

## 2019-10-14 DIAGNOSIS — Z86711 Personal history of pulmonary embolism: Secondary | ICD-10-CM | POA: Diagnosis not present

## 2019-10-14 DIAGNOSIS — Z21 Asymptomatic human immunodeficiency virus [HIV] infection status: Secondary | ICD-10-CM | POA: Diagnosis present

## 2019-10-14 DIAGNOSIS — Z86718 Personal history of other venous thrombosis and embolism: Secondary | ICD-10-CM | POA: Diagnosis not present

## 2019-10-14 DIAGNOSIS — F332 Major depressive disorder, recurrent severe without psychotic features: Secondary | ICD-10-CM | POA: Diagnosis present

## 2019-10-14 DIAGNOSIS — B20 Human immunodeficiency virus [HIV] disease: Secondary | ICD-10-CM

## 2019-10-14 DIAGNOSIS — K219 Gastro-esophageal reflux disease without esophagitis: Secondary | ICD-10-CM | POA: Diagnosis present

## 2019-10-14 DIAGNOSIS — F1124 Opioid dependence with opioid-induced mood disorder: Secondary | ICD-10-CM

## 2019-10-14 MED ORDER — ALUM & MAG HYDROXIDE-SIMETH 200-200-20 MG/5ML PO SUSP: 30.0000 mL | ORAL | Status: DC | PRN

## 2019-10-14 MED ORDER — LOPERAMIDE HCL 2 MG PO CAPS: 2.0000 mg | ORAL_CAPSULE | ORAL | Status: DC | PRN

## 2019-10-14 MED ORDER — LORATADINE 10 MG PO TABS
10.0000 mg | ORAL_TABLET | Freq: Every day | ORAL | Status: DC
  Filled 2019-10-14: qty 1

## 2019-10-14 MED ORDER — TRAZODONE HCL 100 MG PO TABS: 100.0000 mg | ORAL_TABLET | Freq: Every evening | ORAL | Status: DC | PRN

## 2019-10-14 MED ORDER — ACETAMINOPHEN 325 MG PO TABS: 650.0000 mg | ORAL_TABLET | Freq: Four times a day (QID) | ORAL | Status: DC | PRN

## 2019-10-14 MED ORDER — HYDROXYZINE HCL 25 MG PO TABS: 25.0000 mg | ORAL_TABLET | Freq: Four times a day (QID) | ORAL | Status: DC | PRN

## 2019-10-14 MED ORDER — NAPROXEN 500 MG PO TABS
500.0000 mg | ORAL_TABLET | Freq: Two times a day (BID) | ORAL | Status: DC | PRN
  Administered 2019-10-14 – 2019-10-17 (×4): 500 mg via ORAL
  Filled 2019-10-14 (×4): qty 1

## 2019-10-14 MED ORDER — METHOCARBAMOL 500 MG PO TABS: 500.0000 mg | ORAL_TABLET | Freq: Three times a day (TID) | ORAL | Status: DC | PRN

## 2019-10-14 MED ORDER — METHOCARBAMOL 500 MG PO TABS
500.0000 mg | ORAL_TABLET | Freq: Three times a day (TID) | ORAL | Status: DC | PRN
  Administered 2019-10-14 – 2019-10-15 (×3): 500 mg via ORAL
  Filled 2019-10-14 (×4): qty 1

## 2019-10-14 MED ORDER — CLONIDINE HCL 0.1 MG PO TABS
0.1000 mg | ORAL_TABLET | ORAL | Status: DC
  Filled 2019-10-14 (×4): qty 1

## 2019-10-14 MED ORDER — PANTOPRAZOLE SODIUM 40 MG PO TBEC
40.0000 mg | DELAYED_RELEASE_TABLET | Freq: Every day | ORAL | Status: DC
  Administered 2019-10-14: 40 mg via ORAL
  Filled 2019-10-14: qty 1

## 2019-10-14 MED ORDER — TRAZODONE HCL 50 MG PO TABS
50.0000 mg | ORAL_TABLET | Freq: Every evening | ORAL | Status: DC | PRN
  Administered 2019-10-14 – 2019-10-17 (×3): 50 mg via ORAL
  Filled 2019-10-14: qty 1
  Filled 2019-10-14: qty 7
  Filled 2019-10-14 (×2): qty 1

## 2019-10-14 MED ORDER — HYDROXYZINE HCL 25 MG PO TABS
25.0000 mg | ORAL_TABLET | Freq: Four times a day (QID) | ORAL | Status: DC | PRN
  Administered 2019-10-14 – 2019-10-17 (×2): 25 mg via ORAL
  Filled 2019-10-14 (×2): qty 1
  Filled 2019-10-14: qty 10
  Filled 2019-10-14: qty 1

## 2019-10-14 MED ORDER — LORATADINE 10 MG PO TABS
10.0000 mg | ORAL_TABLET | Freq: Every day | ORAL | Status: DC
  Administered 2019-10-14: 10 mg via ORAL
  Filled 2019-10-14: qty 1

## 2019-10-14 MED ORDER — BICTEGRAVIR-EMTRICITAB-TENOFOV 50-200-25 MG PO TABS
1.0000 | ORAL_TABLET | Freq: Every day | ORAL | Status: DC
  Administered 2019-10-14 – 2019-10-18 (×5): 1 via ORAL
  Filled 2019-10-14 (×4): qty 1
  Filled 2019-10-14 (×2): qty 2
  Filled 2019-10-14 (×3): qty 1

## 2019-10-14 MED ORDER — DICYCLOMINE HCL 20 MG PO TABS: 20.0000 mg | ORAL_TABLET | Freq: Four times a day (QID) | ORAL | Status: DC | PRN

## 2019-10-14 MED ORDER — NAPROXEN 500 MG PO TABS: 500.0000 mg | ORAL_TABLET | Freq: Two times a day (BID) | ORAL | Status: DC | PRN

## 2019-10-14 MED ORDER — PANTOPRAZOLE SODIUM 40 MG PO TBEC
40.0000 mg | DELAYED_RELEASE_TABLET | Freq: Every day | ORAL | Status: DC
  Administered 2019-10-15 – 2019-10-18 (×4): 40 mg via ORAL
  Filled 2019-10-14 (×7): qty 1

## 2019-10-14 MED ORDER — NICOTINE POLACRILEX 2 MG MT GUM: 4.0000 mg | CHEWING_GUM | OROMUCOSAL | Status: DC | PRN

## 2019-10-14 MED ORDER — NICOTINE POLACRILEX 2 MG MT GUM
4.0000 mg | CHEWING_GUM | OROMUCOSAL | Status: DC | PRN
  Administered 2019-10-14 – 2019-10-18 (×9): 4 mg via ORAL
  Filled 2019-10-14 (×5): qty 2

## 2019-10-14 MED ORDER — ONDANSETRON HCL 4 MG/2ML IJ SOLN: 4.0000 mg | Freq: Four times a day (QID) | INTRAMUSCULAR | Status: DC | PRN

## 2019-10-14 MED ORDER — ACETAMINOPHEN 325 MG PO TABS
650.0000 mg | ORAL_TABLET | Freq: Four times a day (QID) | ORAL | Status: DC | PRN
  Administered 2019-10-14: 650 mg via ORAL
  Filled 2019-10-14: qty 2

## 2019-10-14 MED ORDER — ONDANSETRON 4 MG PO TBDP: 4.0000 mg | ORAL_TABLET | Freq: Four times a day (QID) | ORAL | Status: DC | PRN

## 2019-10-14 MED ORDER — CLONIDINE HCL 0.1 MG PO TABS: 0.1000 mg | ORAL_TABLET | Freq: Every day | ORAL | Status: DC

## 2019-10-14 MED ORDER — MAGNESIUM HYDROXIDE 400 MG/5ML PO SUSP: 30.0000 mL | Freq: Every day | ORAL | Status: DC | PRN

## 2019-10-14 MED ORDER — CLONIDINE HCL 0.1 MG PO TABS
0.1000 mg | ORAL_TABLET | Freq: Four times a day (QID) | ORAL | Status: AC
  Administered 2019-10-14 – 2019-10-15 (×4): 0.1 mg via ORAL
  Filled 2019-10-14 (×10): qty 1

## 2019-10-14 NOTE — ED Notes (Signed)
Report called to [facility], [employer]

## 2019-10-14 NOTE — Tx Team (Signed)
Initial Treatment Plan 10/14/2019 6:30 PM TREJAN BUDA KGY:[id number]    PATIENT STRESSORS: Health problems Marital or family conflict Substance abuse   PATIENT STRENGTHS: Average or above average intelligence Supportive family/friends   PATIENT IDENTIFIED PROBLEMS: Substance abuse  Depression  Anxiety  Irritability  Insomnia             DISCHARGE CRITERIA:  Improved stabilization in mood, thinking, and/or behavior Reduction of life-threatening or endangering symptoms to within safe limits Verbal commitment to aftercare and medication compliance  PRELIMINARY DISCHARGE PLAN: Outpatient therapy Return to previous living arrangement Return to previous work or school arrangements  PATIENT/FAMILY INVOLVEMENT: This treatment plan has been presented to and reviewed with the patient, Jimmy Fisher, and/or family member.  The patient and family have been given the opportunity to ask questions and make suggestions.  Tania Ade, RN 10/14/2019, 6:30 PM

## 2019-10-14 NOTE — Progress Notes (Addendum)
Patient ID: CALAB SACHSE, male   DOB: 1981-04-11, 39 y.o.   MRN: [id number]   Pt alert and oriented during admission. Pt IVC'd for SI when he overdosed on heroin and given CPR and Naloxone. Pt was irritable and did not want to be here and became angry when he was informed that there is no visitation at [facility] because of COVID-19. Pt said "Fuck this! I ain't signing shit!" Pt refused to sign admission paperwork. However, pt did sign Treatment Agreement and Belongings Sheet before his outburst. Pt denies HI and A/VH. He is passive for SI and states that he contracts for safety; agreeing not to harm himself "Not til I leave." Education, support, reassurance, and encouragement provided, q15 minute safety checks initiated. Pt's belongings in locker #32. Pt ambulating on the unit with no issues. Pt remains safe on the unit.

## 2019-10-14 NOTE — Progress Notes (Signed)
Writer introduced self to the pt. The pt is irritable and frustrated after finding out there is no visitation allowed at [facility]. He stated someone at [facility] told him his wife would be able to visit him here in the hospital. He is apologetic for showing his frustration towards staff. He expressed overdosing on heroin after finding out he's HIV. He stated this was not a suicide attempt and that he was just trying to get high. He reported having suicidal thoughts prior to finding out he's HIV positive and but heroin overdose was not intentional. He reported finding out he's HIV positive by reading his mychart results after receiving an e-mail. He reported that no one called him from the doctor office to tell him he was HIV positive. He stated that his wife was tested for HIV and is negative. Pt denies SI at this time and verbally contracts for safety. He denies AVH.   Marland Kitchen

## 2019-10-14 NOTE — Progress Notes (Signed)
Patient observed this evening sitting in the dayroom talking with select peers. Writer spoke with him 1:1 and he told Clinical research associate what had happened and why he is currently here. Writer gave him a patient education print out per his request for medication (biktarvy). Patient was polite and very much concerned about his wife and his new diagnosis recently found out. He was informed of medication available for tonight. He currently denies si/hi/auditory and visual hallucinations. Support given and safety maintained with 15 min checks.

## 2019-10-14 NOTE — Progress Notes (Signed)
Per Berdine Dance, pt has been accepted to [facility]. Bed number is 307. Accepting provider is  Nanine Means, NP. Attending provider will be Dr. Jama Flavors. Number for report is [phone]. The pt may arrive after 3pm. Pt may be transported by [facility] Dept.    Ruthann Cancer MSW, [facility] Clincal Social Worker Disposition  [facility] Ph: [phone] Fax: [phone]  10/14/2019 11:17 AM

## 2019-10-14 NOTE — Progress Notes (Signed)
[facility] Group Notes:  (Nursing/MHT/Case Management/Adjunct)  Date:  10/14/2019  Time:  2030 Type of Therapy:  wrap up group  Participation Level:  Active  Participation Quality:  Appropriate, Attentive, Sharing and Supportive  Affect:  Appropriate  Cognitive:  Appropriate  Insight:  Improving  Engagement in Group:  Engaged  Modes of Intervention:  Clarification, Education and Support  Summary of Progress/Problems: Positive thinking and self-care were discussed.   Jimmy Fisher 10/14/2019, 9:38 PM

## 2019-10-14 NOTE — Consult Note (Signed)
[facility] Face-to-Face Psychiatry Consult   Reason for Consult:  Suicide attempt Referring Physician:  EDP Patient Identification: WOLFE CAMARENA MRN:  [id number] Principal Diagnosis: Major depressive disorder, recurrent severe without psychotic features (HCC) Diagnosis:  Principal Problem:   Major depressive disorder, recurrent severe without psychotic features (HCC) Active Problems:   Opiate overdose (HCC)   Heroin overdose (HCC)  Total Time spent with patient: 1 hour  Subjective:   Jimmy Fisher is a 39 y.o. male patient admitted with suicide attempt.  Patient seen and evaluated in person by this provider with his RN.  Continues to endorse depression and anxiety related to recent relapse on heroin along with marital and occupation stressors.  Calmly watching television in his room with some irritability noted.  Denies any withdrawal symptoms at this time.  Continues to minimize his suicide attempt despite having to have Narcan to revive him.  Patient accepted to [facility] after 3 PM today.  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  Reports, "I was trying to end the pain."  When asked about the overdose.  "I have been suicidal for the past six months, miserable with my life, my marriage, my job."  He and his wife have been living separately in the home for the past 6-8 months.   He is concerned about his wife's potential contraction of HIV as they have had sex four times in the past 8 months, worried he may have had a high viral load making her more susceptible.  High level of depression and anxiety, denies panic attacks.  Panic attacks, twice in his life, two years ago.  Does not currently taking psychiatric medications.  He has been on Paxil and Risperdal in the past, "I felt like a robot, I am not taking that stuff."  Poor sleep initiation, maintenance is good.  Appetite is decreased.  Past history per  patient of ADHD, depression, anxiety.  Opiate use disorder, no use for 8 years until 8 months ago with one relapse.  Reports he used a needle from someone and feels this is where he contracted HIV.  Denies any use since then until last night.  Denies he was trying to kill himself, "I was trying to end the pain."  This is not what he said on arrival to the ED and not what his wife reports, who told the RN that he told her he was trying to kill himself.  Denies withdrawal symptoms/cravings.  No hallucinations, paranoia, homicidal ideations, or mania symptoms.  Inpatient psychiatric admission warranted.  Past Psychiatric History: depression, anxiety, opiate use disorder  Risk to Self: Suicidal Ideation: Yes-Currently Present Suicidal Intent: Yes-Currently Present Is patient at risk for suicide?: Yes Suicidal Plan?: Yes-Currently Present Specify Current Suicidal Plan: Overdose Access to Means: Yes Specify Access to Suicidal Means: Pt had heroin What has been your use of drugs/alcohol within the last 12 months?: Current use How many times?: 0 Other Self Harm Risks: (NA) Triggers for Past Attempts: (NA) Intentional Self Injurious Behavior: N[place]es Risk to Others: Homicidal Ideation: No Thoughts of Harm to Others: No Current Homicidal Intent: No Current Homicidal Plan: No Access to Homicidal Means: No Identified Victim: NA History of harm to others?: No Assessment of Violence: None Noted Violent Behavior Description: NA Does patient have access to weapons?: No Criminal Charges Pending?: No Does patient have a court date: Nonone Prior Inpatient Therapy: Prior Inpatient Therapy: [facility]  Prior Outpatient Therapy: Prior Outpatient Therapy: No Does patient have an ACCT team?: No Does patient have Intensive In-House Services?  : No Does patient have [facility] services? : No Does patient have P4CC services?: [place]  Past Medical History:  Past Medical History:  Diagnosis Date  . DVT (deep  venous thrombosis) ([place])   . GERD (gastroesophageal reflux disease)   . Hiatal hernia   . Hypercholesteremia   . Pulmonary embolism ([place])    History reviewed. No pertinent surgical history. Family History: No family history on file. Family Psychiatric  History: denies Social History:  Social History   Substance and Sexual Activity  Alcohol Use No     Social History   Substance and Sexual Activity  Drug Use No    Social History   Socioeconomic History  . Marital status: Married    Spouse name: Not on file  . Number of children: Not on file  . Years of education: Not on file  . Highest education level: Not on file  Occupational History  . Not on file  Tobacco Use  . Smoking status: Never Smoker  . Smokeless tobacco: Never Used  Substance and Sexual Activity  . Alcohol use: No  . Drug use: No  . Sexual activity: Not on file  Other Topics Concern  . Not on file  Social History Narrative  . Not on file   Social Determinants of Health   Financial Resource Strain:   . Difficulty of Paying Living Expenses:   Food Insecurity:   . Worried About Charity fundraiser in the Last Year:   . Arboriculturist in the Last Year:   Transportation Needs:   . Film/video editor (Medical):   Marland Kitchen Lack of Transportation (Non-Medical):   Physical Activity:   . Days of Exercise per Week:   . Minutes of Exercise per Session:   Stress:   . Feeling of Stress :   Social Connections:   . Frequency of Communication with Friends and Family:   . Frequency of Social Gatherings with Friends and Family:   . Attends Religious Services:   . Active Member of Clubs or Organizations:   . Attends Archivist Meetings:   Marland Kitchen Marital Status:    Additional Social History:    Allergies:  No Known Allergies  Labs:  Results for orders placed or performed during the hospital encounter of 10/13/19 (from the past 48 hour(s))  CBG monitoring, ED     Status: Abnormal   Collection Time:  10/13/19 12:48 AM  Result Value Ref Range   Glucose-Capillary 143 (H) 70 - 99 mg/dL    Comment: Glucose reference range applies only to samples taken after fasting for at least 8 hours.  Comprehensive metabolic panel     Status: Abnormal   Collection Time: 10/13/19  1:32 AM  Result Value Ref Range   Sodium 140 135 - 145 mmol/L   Potassium 4.6 3.5 - 5.1 mmol/L   Chloride 106 98 - 111 mmol/L   CO2 23 22 - 32 mmol/L   Glucose, Bld 128 (H) 70 - 99 mg/dL    Comment: Glucose reference range applies only to samples taken after fasting for at least 8 hours.   BUN 13 6 - 20 mg/dL   Creatinine, Ser 1.08 0.61 - 1.24 mg/dL   Calcium 8.8 (L) 8.9 - 10.3 mg/dL   Total Protein 7.7 6.5 - 8.1 g/dL   Albumin 4.1 3.5 - 5.0 g/dL  AST 42 (H) 15 - 41 U/L   ALT 42 0 - 44 U/L   Alkaline Phosphatase 66 38 - 126 U/L   Total Bilirubin 0.7 0.3 - 1.2 mg/dL   GFR calc non Af Amer >60 >60 mL/min   GFR calc Af Amer >60 >60 mL/min   Anion gap 11 5 - 15    Comment: Performed at [facility], [address], [place], [place] [address]  Salicylate level     Status: Abnormal   Collection Time: 10/13/19  1:32 AM  Result Value Ref Range   Salicylate Lvl <7.0 (L) 7.0 - 30.0 mg/dL    Comment: Performed at [facility], [address] [place], [place] [address]  Acetaminophen level     Status: Abnormal   Collection Time: 10/13/19  1:32 AM  Result Value Ref Range   Acetaminophen (Tylenol), Serum <10 (L) 10 - 30 ug/mL    Comment: (NOTE) Therapeutic concentrations vary significantly. A range of 10-30 ug/mL  may be an effective concentration for many patients. However, some  are best treated at concentrations outside of this range. Acetaminophen concentrations >150 ug/mL at 4 hours after ingestion  and >50 ug/mL at 12 hours after ingestion are often associated with  toxic reactions. Performed at [facility], [address], [place], [place] [address]    Ethanol     Status: Abnormal   Collection Time: 10/13/19  1:32 AM  Result Value Ref Range   Alcohol, Ethyl (B) 37 (H) <10 mg/dL    Comment: (NOTE) Lowest detectable limit for serum alcohol is 10 mg/dL. For medical purposes only. Performed at [facility], [address] [place], [place] [address]   CBC WITH DIFFERENTIAL     Status: Abnormal   Collection Time: 10/13/19  1:32 AM  Result Value Ref Range   WBC 10.7 (H) 4.0 - 10.5 K/uL   RBC 5.23 4.22 - 5.81 MIL/uL   Hemoglobin 15.2 13.0 - 17.0 g/dL   HCT 37.3 42.8 - 76.8 %   MCV 88.5 80.0 - 100.0 fL   MCH 29.1 26.0 - 34.0 pg   MCHC 32.8 30.0 - 36.0 g/dL   RDW 11.5 72.6 - 20.3 %   Platelets 230 150 - 400 K/uL   nRBC 0.0 0.0 - 0.2 %   Neutrophils Relative % 77 %   Neutro Abs 8.2 (H) 1.7 - 7.7 K/uL   Lymphocytes Relative 15 %   Lymphs Abs 1.6 0.7 - 4.0 K/uL   Monocytes Relative 6 %   Monocytes Absolute 0.7 0.1 - 1.0 K/uL   Eosinophils Relative 1 %   Eosinophils Absolute 0.1 0.0 - 0.5 K/uL   Basophils Relative 0 %   Basophils Absolute 0.0 0.0 - 0.1 K/uL   Immature Granulocytes 1 %   Abs Immature Granulocytes 0.09 (H) 0.00 - 0.07 K/uL    Comment: Performed at [facility], [address] [place], [place] [address]  Respiratory Panel by RT PCR (Flu A&B, Covid) - Nasopharyngeal Swab     Status: None   Collection Time: 10/13/19  2:42 AM   Specimen: Nasopharyngeal Swab  Result Value Ref Range   SARS Coronavirus 2 by RT PCR NEGATIVE NEGATIVE    Comment: (NOTE) SARS-CoV-2 target nucleic acids are NOT DETECTED. The SARS-CoV-2 RNA is generally detectable in upper respiratoy specimens during the acute phase of infection. The lowest concentration of SARS-CoV-2 viral copies this assay can detect is 131 copies/mL. A  negative result does not preclude SARS-Cov-2 infection and should not be used as the sole basis for treatment or other patient management decisions. A negative result may occur with   improper specimen collection/handling, submission of specimen other than nasopharyngeal swab, presence of viral mutation(s) within the areas targeted by this assay, and inadequate number of viral copies (<131 copies/mL). A negative result must be combined with clinical observations, patient history, and epidemiological information. The expected result is Negative. Fact Sheet for Patients:  https://www.moore.com/ Fact Sheet for Healthcare Providers:  https://www.young.biz/ This test is not yet ap proved or cleared by the [place] FDA and  has been authorized for detection and/or diagnosis of SARS-CoV-2 by FDA under an Emergency Use Authorization (EUA). This EUA will remain  in effect (meaning this test can be used) for the duration of the COVID-19 declaration under Section 564(b)(1) of the Act, 21 U.S.C. section 360bbb-3(b)(1), unless the authorization is terminated or revoked sooner.    Influenza A by PCR NEGATIVE NEGATIVE   Influenza B by PCR NEGATIVE NEGATIVE    Comment: (NOTE) The Xpert Xpress SARS-CoV-2/FLU/RSV assay is intended as an aid in  the diagnosis of influenza from Nasopharyngeal swab specimens and  should not be used as a sole basis for treatment. Nasal washings and  aspirates are unacceptable for Xpert Xpress SARS-CoV-2/FLU/RSV  testing. Fact Sheet for Patients: https://www.moore.com/ Fact Sheet for Healthcare Providers: https://www.young.biz/ This test is not yet approved or cleared by the [place] FDA and  has been authorized for detection and/or diagnosis of SARS-CoV-2 by  FDA under an Emergency Use Authorization (EUA). This EUA will remain  in effect (meaning this test can be used) for the duration of the  Covid-19 declaration under Section 564(b)(1) of the Act, 21  U.S.C. section 360bbb-3(b)(1), unless the authorization is  terminated or revoked. Performed at [facility], [address] [place], [place] [address]   Urine rapid drug screen (hosp performed)     Status: None   Collection Time: 10/13/19  6:06 AM  Result Value Ref Range   Opiates NONE DETECTED NONE DETECTED   Cocaine NONE DETECTED NONE DETECTED   Benzodiazepines NONE DETECTED NONE DETECTED   Amphetamines NONE DETECTED NONE DETECTED   Tetrahydrocannabinol NONE DETECTED NONE DETECTED   Barbiturates NONE DETECTED NONE DETECTED    Comment: (NOTE) DRUG SCREEN FOR MEDICAL PURPOSES ONLY.  IF CONFIRMATION IS NEEDED FOR ANY PURPOSE, NOTIFY LAB WITHIN 5 DAYS. LOWEST DETECTABLE LIMITS FOR URINE DRUG SCREEN Drug Class                     Cutoff (ng/mL) Amphetamine and metabolites    1000 Barbiturate and metabolites    200 Benzodiazepine                 200 Tricyclics and metabolites     300 Opiates and metabolites        300 Cocaine and metabolites        300 THC                            50 Performed at [facility], [address], [place], [place] [address]     Current Facility-Administered Medications  Medication Dose Route Frequency Provider Last Rate Last Admin  . acetaminophen (TYLENOL) tablet 650 mg  650 mg Oral Q6H PRN Terald Sleeper, MD   650 mg at 10/14/19 0743  . loratadine (  CLARITIN) tablet 10 mg  10 mg Oral Daily Terald Sleeper, MD   Stopped at 10/14/19 1000  . naloxone HCl (NARCAN) 4 mg in dextrose 5 % 250 mL infusion  0.5 mg/hr Intravenous Continuous Roxy Horseman, PA-C   Stopped at 10/13/19 0912  . nicotine polacrilex (NICORETTE) gum 4 mg  4 mg Oral PRN Terald Sleeper, MD      . ondansetron [facility]) injection 4 mg  4 mg Intravenous Q6H PRN Salome Holmes, MD   4 mg at 10/13/19 1833  . pantoprazole (PROTONIX) EC tablet 40 mg  40 mg Oral Daily Terald Sleeper, MD   Stopped at 10/14/19 1000  . traZODone (DESYREL) tablet 100 mg  100 mg Oral QHS PRN Charm Rings, NP       Current Outpatient Medications  Medication Sig  Dispense Refill  . acetaminophen (TYLENOL) 500 MG tablet Take 1,000 mg by mouth every 6 (six) hours as needed.    . cetirizine (ZYRTEC) 10 MG tablet Take 10 mg by mouth daily.    Marland Kitchen ibuprofen (ADVIL) 200 MG tablet Take 400 mg by mouth every 6 (six) hours as needed for headache or moderate pain.    Marland Kitchen omeprazole (PRILOSEC) 20 MG capsule Take 20 mg by mouth daily.    . cyclobenzaprine (FEXMID) 7.5 MG tablet Take 1 tablet (7.5 mg total) by mouth 3 (three) times daily as needed for muscle spasms. (Patient not taking: Reported on 05/29/2019) 30 tablet 0  . EPINEPHrine (EPIPEN 2-PAK) 0.3 mg/0.3 mL IJ SOAJ injection Inject 0.3 mLs (0.3 mg total) into the muscle as needed for anaphylaxis. (Patient not taking: Reported on 05/29/2019) 1 each 0  . famotidine (PEPCID) 20 MG tablet Take 1 tablet (20 mg total) by mouth 2 (two) times daily. (Patient not taking: Reported on 10/13/2019) 60 tablet 0  . predniSONE (DELTASONE) 5 MG tablet Take 6 pills for first day, 5 pills second day, 4 pills third day, 3 pills fourth day, 2 pills the fifth day, and 1 pill sixth day. (Patient not taking: Reported on 05/29/2019) 21 tablet 0  . sucralfate (CARAFATE) 1 g tablet Take 1 tablet (1 g total) by mouth 4 (four) times daily -  with meals and at bedtime. (Patient not taking: Reported on 05/29/2019) 120 tablet 0    Musculoskeletal: Strength & Muscle Tone: within normal limits Gait & Station: normal Patient leans: N/A  Psychiatric Specialty Exam: Physical Exam  Nursing note and vitals reviewed. Constitutional: He is oriented to person, place, and time. He appears well-developed and well-nourished.  HENT:  Head: Normocephalic.  Respiratory: Effort normal.  Musculoskeletal:        General: Normal range of motion.     Cervical back: Normal range of motion.  Neurological: He is alert and oriented to person, place, and time.  Psychiatric: His speech is normal and behavior is normal. His mood appears anxious. Cognition and memory  are normal. He expresses impulsivity. He exhibits a depressed mood. He expresses suicidal ideation.    Review of Systems  Psychiatric/Behavioral: Positive for dysphoric mood, sleep disturbance and suicidal ideas. The patient is nervous/anxious.   All other systems reviewed and are negative.   Blood pressure 116/79, pulse 70, temperature 98.5 F (36.9 C), temperature source Oral, resp. rate 19, height 5\' 6"  (1.676 m), weight 83.9 kg, SpO2 97 %.Body mass index is 29.86 kg/m.  General Appearance: Casual  Eye Contact:  Good  Speech:  Normal Rate  Volume:  Normal  Mood:  Anxious and Depressed  Affect:  Congruent  Thought Process:  Coherent and Descriptions of Associations: Intact  Orientation:  Full (Time, Place, and Person)  Thought Content:  Obsessions  Suicidal Thoughts:  Yes.  with intent/plan  Homicidal Thoughts:  No  Memory:  Immediate;   Fair Recent;   Fair Remote;   Fair  Judgement:  Poor  Insight:  Lacking  Psychomotor Activity:  Decreased  Concentration:  Concentration: Fair and Attention Span: Fair  Recall:  [employer]is[employer]Fair  Fund of Knowledge:  Fair  Language:  Good  Akathisia:  No  Handed:  Right  AIMS (if indicated):     Assets:  Housing Leisure Time Physical Health Resilience Social Support Vocational/Educational  ADL's:  Intact  Cognition:  WNL  Sleep:        Treatment Plan Summary: Daily contact with patient to assess and evaluate symptoms and progress in treatment, Medication management and Plan major depressive disorder, recurrent, severe without psychosis:  -Recommend antidepressant , patient refused -[facility] 3 PM  Opiate use disorder: -Continue monitoring withdrawal symptoms  Insomnia: -Started Trazodone 100 mg PRN sleep  Disposition: Recommend psychiatric Inpatient admission when medically cleared.  Nanine MeansJamison Kylyn Sookram, NP 10/14/2019 11:44 AM

## 2019-10-14 NOTE — ED Notes (Signed)
During suicide screening pt states he will handle it as soon as he gets released. He does not want to live with the bad news he received.

## 2019-10-14 NOTE — ED Provider Notes (Signed)
39 yo male here with intentional heroin overdose, initially s/p narcan drip, now off the drip and medically cleared for behavioral health assessment yesterday 10/13/19.  No further acute complaints overnight.  Vitals reviewed and stable this morning.  Labs reviewed. Patient resting comfortably.  Recommended for inpatient psychiatric admission - pending placement.   Terald Sleeper, MD 10/14/19 [phone]

## 2019-10-14 NOTE — ED Notes (Signed)
Transport called.

## 2019-10-14 NOTE — Progress Notes (Signed)
   10/14/19 2040  COVID-19 Daily Checkoff  Have you had a fever (temp > 37.80C/100F)  in the past 24 hours?  No  If you have had runny nose, nasal congestion, sneezing in the past 24 hours, has it worsened? No  COVID-19 EXPOSURE  Have you traveled outside the state in the past 14 days? No  Have you been in contact with someone with a confirmed diagnosis of COVID-19 or PUI in the past 14 days without wearing appropriate PPE? No  Have you been living in the same home as a person with confirmed diagnosis of COVID-19 or a PUI (household contact)? No  Have you been diagnosed with COVID-19? No

## 2019-10-15 DIAGNOSIS — F1124 Opioid dependence with opioid-induced mood disorder: Secondary | ICD-10-CM

## 2019-10-15 LAB — CD4/CD8 (T-HELPER/T-SUPPRESSOR CELL)
CD4 absolute: 512 /uL (ref 400–1790)
CD4%: 27 % — ABNORMAL LOW (ref 33–65)
CD8 T Cell Abs: 1024 /uL — ABNORMAL HIGH (ref 190–1000)
CD8tox: 54 % — ABNORMAL HIGH (ref 12–40)
Ratio: 0.5 — ABNORMAL LOW (ref 1.0–3.0)
Total lymphocyte count: 1911 /uL (ref 1000–4000)

## 2019-10-15 LAB — HEPATITIS B SURFACE ANTIGEN: Hepatitis B Surface Ag: NONREACTIVE

## 2019-10-15 LAB — HEPATITIS A ANTIBODY, TOTAL: hep A Total Ab: NONREACTIVE

## 2019-10-15 LAB — HEPATITIS C ANTIBODY: HCV Ab: NONREACTIVE

## 2019-10-15 MED ORDER — SERTRALINE HCL 50 MG PO TABS
50.0000 mg | ORAL_TABLET | Freq: Every day | ORAL | Status: DC
  Administered 2019-10-15 – 2019-10-18 (×4): 50 mg via ORAL
  Filled 2019-10-15: qty 1
  Filled 2019-10-15: qty 7
  Filled 2019-10-15: qty 1
  Filled 2019-10-15: qty 7
  Filled 2019-10-15 (×4): qty 1

## 2019-10-15 NOTE — Progress Notes (Signed)
Recreation Therapy Notes  Date:  5.3.21 Time: 0930 Location: 300 Hall Dayroom  Group Topic: Stress Management  Goal Area(s) Addresses:  Patient will identify positive stress management techniques. Patient will identify benefits of using stress management post d/c.  Intervention: Stress Management  Activity: Guided Imagery.  LRT read a script that took patients on a journey to the beach to hear the peaceful waves.  Patients were to listen and follow along as script was read to engage in activity.    Education:  Stress Management, Discharge Planning.   Education Outcome: Acknowledges Education  Clinical Observations/Feedback: Pt did not attend activity.    Caroll Rancher, LRT/CTRS         Caroll Rancher A 10/15/2019 12:06 PM

## 2019-10-15 NOTE — BHH Group Notes (Signed)
LCSW Group Therapy Note 10/15/2019 2:12 PM  Type of Therapy and Topic: Group Therapy: Overcoming Obstacles  Participation Level: Did Not Attend  Description of Group:  In this group patients will be encouraged to explore what they see as obstacles to their own wellness and recovery. They will be guided to discuss their thoughts, feelings, and behaviors related to these obstacles. The group will process together ways to cope with barriers, with attention given to specific choices patients can make. Each patient will be challenged to identify changes they are motivated to make in order to overcome their obstacles. This group will be process-oriented, with patients participating in exploration of their own experiences as well as giving and receiving support and challenge from other group members.  Therapeutic Goals: 1. Patient will identify personal and current obstacles as they relate to admission. 2. Patient will identify barriers that currently interfere with their wellness or overcoming obstacles.  3. Patient will identify feelings, thought process and behaviors related to these barriers. 4. Patient will identify two changes they are willing to make to overcome these obstacles:   Summary of Patient Progress  Invited, chose not to attend.    Therapeutic Modalities:  Cognitive Behavioral Therapy Solution Focused Therapy Motivational Interviewing Relapse Prevention Therapy   Alcario Drought Clinical Social Worker

## 2019-10-15 NOTE — Tx Team (Signed)
Interdisciplinary Treatment and Diagnostic Plan Update  10/15/2019 Time of Session: 9:40am Jimmy Fisher MRN: [id number]  Principal Diagnosis: <principal problem not specified>  Secondary Diagnoses: Active Problems:   Major depressive disorder, recurrent severe without psychotic features ([place])   Current Medications:  Current Facility-Administered Medications  Medication Dose Route Frequency Provider Last Rate Last Admin  . acetaminophen (TYLENOL) tablet 650 mg  650 mg Oral Q6H PRN Patrecia Pour, NP      . bictegravir-emtricitabine-tenofovir AF (BIKTARVY) 50-200-25 MG per tablet 1 tablet  1 tablet Oral Daily Tommy Medal, Lavell Islam, MD   1 tablet at 10/15/19 [phone]  . cloNIDine (CATAPRES) tablet 0.1 mg  0.1 mg Oral QID Cobos, Myer Peer, MD   0.1 mg at 10/15/19 5621   Followed by  . [START ON 10/16/2019] cloNIDine (CATAPRES) tablet 0.1 mg  0.1 mg Oral BH-qamhs Cobos, Myer Peer, MD       Followed by  . [START ON 10/19/2019] cloNIDine (CATAPRES) tablet 0.1 mg  0.1 mg Oral QAC breakfast Cobos, Fernando A, MD      . dicyclomine (BENTYL) tablet 20 mg  20 mg Oral Q6H PRN Cobos, Myer Peer, MD      . hydrOXYzine (ATARAX/VISTARIL) tablet 25 mg  25 mg Oral Q6H PRN Cobos, Myer Peer, MD   25 mg at 10/14/19 1829  . loperamide (IMODIUM) capsule 2-4 mg  2-4 mg Oral PRN Cobos, Myer Peer, MD      . methocarbamol (ROBAXIN) tablet 500 mg  500 mg Oral Q8H PRN Cobos, Myer Peer, MD   500 mg at 10/15/19 0825  . naproxen (NAPROSYN) tablet 500 mg  500 mg Oral BID PRN Cobos, Myer Peer, MD   500 mg at 10/15/19 0825  . nicotine polacrilex (NICORETTE) gum 4 mg  4 mg Oral PRN Patrecia Pour, NP   4 mg at 10/15/19 0825  . ondansetron (ZOFRAN-ODT) disintegrating tablet 4 mg  4 mg Oral Q6H PRN Cobos, Myer Peer, MD      . pantoprazole (PROTONIX) EC tablet 40 mg  40 mg Oral Daily Patrecia Pour, NP   40 mg at 10/15/19 3086  . traZODone (DESYREL) tablet 50 mg  50 mg Oral QHS PRN Rozetta Nunnery, NP   50 mg at 10/14/19  2202   PTA Medications: Medications Prior to Admission  Medication Sig Dispense Refill Last Dose  . acetaminophen (TYLENOL) 500 MG tablet Take 1,000 mg by mouth every 6 (six) hours as needed.     . cetirizine (ZYRTEC) 10 MG tablet Take 10 mg by mouth daily.     . cyclobenzaprine (FEXMID) 7.5 MG tablet Take 1 tablet (7.5 mg total) by mouth 3 (three) times daily as needed for muscle spasms. (Patient not taking: Reported on 05/29/2019) 30 tablet 0   . EPINEPHrine (EPIPEN 2-PAK) 0.3 mg/0.3 mL IJ SOAJ injection Inject 0.3 mLs (0.3 mg total) into the muscle as needed for anaphylaxis. (Patient not taking: Reported on 05/29/2019) 1 each 0   . famotidine (PEPCID) 20 MG tablet Take 1 tablet (20 mg total) by mouth 2 (two) times daily. (Patient not taking: Reported on 10/13/2019) 60 tablet 0   . ibuprofen (ADVIL) 200 MG tablet Take 400 mg by mouth every 6 (six) hours as needed for headache or moderate pain.     Marland Kitchen omeprazole (PRILOSEC) 20 MG capsule Take 20 mg by mouth daily.     . predniSONE (DELTASONE) 5 MG tablet Take 6 pills for first day, 5 pills second  day, 4 pills third day, 3 pills fourth day, 2 pills the fifth day, and 1 pill sixth day. (Patient not taking: Reported on 05/29/2019) 21 tablet 0   . sucralfate (CARAFATE) 1 g tablet Take 1 tablet (1 g total) by mouth 4 (four) times daily -  with meals and at bedtime. (Patient not taking: Reported on 05/29/2019) 120 tablet 0     Patient Stressors: Health problems Marital or family conflict Substance abuse  Patient Strengths: Average or above average intelligence Supportive family/friends  Treatment Modalities: Medication Management, Group therapy, Case management,  1 to 1 session with clinician, Psychoeducation, Recreational therapy.   Physician Treatment Plan for Primary Diagnosis: <principal problem not specified> Long Term Goal(s):     Short Term Goals:    Medication Management: Evaluate patient's response, side effects, and tolerance of  medication regimen.  Therapeutic Interventions: 1 to 1 sessions, Unit Group sessions and Medication administration.  Evaluation of Outcomes: Not Met  Physician Treatment Plan for Secondary Diagnosis: Active Problems:   Major depressive disorder, recurrent severe without psychotic features ([place])  Long Term Goal(s):     Short Term Goals:       Medication Management: Evaluate patient's response, side effects, and tolerance of medication regimen.  Therapeutic Interventions: 1 to 1 sessions, Unit Group sessions and Medication administration.  Evaluation of Outcomes: Not Met   RN Treatment Plan for Primary Diagnosis: <principal problem not specified> Long Term Goal(s): Knowledge of disease and therapeutic regimen to maintain health will improve  Short Term Goals: Ability to participate in decision making will improve, Ability to verbalize feelings will improve, Ability to disclose and discuss suicidal ideas, Ability to identify and develop effective coping behaviors will improve and Compliance with prescribed medications will improve  Medication Management: RN will administer medications as ordered by provider, will assess and evaluate patient's response and provide education to patient for prescribed medication. RN will report any adverse and/or side effects to prescribing provider.  Therapeutic Interventions: 1 on 1 counseling sessions, Psychoeducation, Medication administration, Evaluate responses to treatment, Monitor vital signs and CBGs as ordered, Perform/monitor CIWA, COWS, AIMS and Fall Risk screenings as ordered, Perform wound care treatments as ordered.  Evaluation of Outcomes: Not Met   LCSW Treatment Plan for Primary Diagnosis: <principal problem not specified> Long Term Goal(s): Safe transition to appropriate next level of care at discharge, Engage patient in therapeutic group addressing interpersonal concerns.  Short Term Goals: Engage patient in aftercare planning with  referrals and resources  Therapeutic Interventions: Assess for all discharge needs, 1 to 1 time with Social worker, Explore available resources and support systems, Assess for adequacy in community support network, Educate family and significant other(s) on suicide prevention, Complete Psychosocial Assessment, Interpersonal group therapy.  Evaluation of Outcomes: Not Met   Progress in Treatment: Attending groups: No. New to unit  Participating in groups: No. Taking medication as prescribed: Yes. Toleration medication: Yes. Family/Significant other contact made: No, will contact:  if patient consents to collateral contacts Patient understands diagnosis: Yes. Discussing patient identified problems/goals with staff: Yes. Medical problems stabilized or resolved: Yes. Denies suicidal/homicidal ideation: Yes. Issues/concerns per patient self-inventory: No. Other:   New problem(s) identified: None   New Short Term/Long Term Goal(s): .Detox, medication stabilization, elimination of SI thoughts, development of comprehensive mental wellness plan.    Patient Goals: "Honestly, I do not know. I was not suicidal. I accidentally overdosed while trying to get high. I did not intend to overdose or to commit suicide. I want  to leave as soon as possible"    Discharge Plan or Barriers: Patient recently admitted. CSW will continue to follow and assess for appropriate referrals and possible discharge planning.    Reason for Continuation of Hospitalization: Aggression Anxiety Depression Medication stabilization Suicidal ideation Other; describe Insomnia, Substance abuse  Estimated Length of Stay: 3-5 days   Attendees: Patient: Jimmy Fisher  10/15/2019 10:28 AM  Physician: Dr. Neita Garnet, MD 10/15/2019 10:28 AM  Nursing:  10/15/2019 10:28 AM  RN Care Manager: 10/15/2019 10:28 AM  Social Worker: Radonna Ricker, LCSW 10/15/2019 10:28 AM  Recreational Therapist:  10/15/2019 10:28 AM  Other:  10/15/2019  10:28 AM  Other:  10/15/2019 10:28 AM  Other: 10/15/2019 10:28 AM    Scribe for Treatment Team: Marylee Floras, [place] 10/15/2019 10:28 AM
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
## 2019-10-15 NOTE — Progress Notes (Signed)
   10/15/19 1300  Psych Admission Type (Psych Patients Only)  Admission Status Involuntary  Psychosocial Assessment  Patient Complaints Depression;Substance abuse  Eye Contact Fair  Facial Expression Worried  Affect Appropriate to circumstance;Preoccupied  Speech Logical/coherent  Interaction Assertive  Motor Activity Other (Comment) (WNL)  Appearance/Hygiene In scrubs  Behavior Characteristics Irritable  Mood Depressed  Thought Process  Coherency WDL  Content WDL  Delusions None reported or observed  Perception WDL  Hallucination None reported or observed  Judgment Poor  Confusion None  Danger to Self  Current suicidal ideation? Denies  Self-Injurious Behavior Some self-injurious ideation observed or expressed.  No lethal plan expressed   Agreement Not to Harm Self Yes (Per pt, "Not til I leave here.")  Description of Agreement verbally contracts for safety  Danger to Others  Danger to Others None reported or observed

## 2019-10-16 DIAGNOSIS — F332 Major depressive disorder, recurrent severe without psychotic features: Principal | ICD-10-CM

## 2019-10-16 LAB — HIV-1 RNA QUANT-NO REFLEX-BLD
HIV 1 RNA Quant: 223000 copies/mL
LOG10 HIV-1 RNA: 5.348 log10copy/mL

## 2019-10-16 LAB — HEPATITIS B SURFACE ANTIBODY, QUANTITATIVE: Hep B S AB Quant (Post): <3.1 m[IU]/mL — ABNORMAL LOW (ref >9.9)

## 2019-10-16 MED ORDER — TETANUS-DIPHTH-ACELL PERTUSSIS 5-2.5-18.5 LF-MCG/0.5 IM SUSP
0.5000 mL | Freq: Once | INTRAMUSCULAR | Status: AC
  Administered 2019-10-16: 0.5 mL via INTRAMUSCULAR
  Filled 2019-10-16: qty 0.5

## 2019-10-16 MED ORDER — HEPATITIS A VACCINE 1440 EL U/ML IM SUSP
1.0000 mL | Freq: Once | INTRAMUSCULAR | Status: AC
  Administered 2019-10-17: 1440 [IU] via INTRAMUSCULAR
  Filled 2019-10-16: qty 1

## 2019-10-16 MED ORDER — HEPATITIS A VACCINE 1440 EL U/ML IM SUSP
1.0000 mL | Freq: Once | INTRAMUSCULAR | Status: DC
  Filled 2019-10-16: qty 1

## 2019-10-16 MED ORDER — PNEUMOCOCCAL 13-VAL CONJ VACC IM SUSP
0.5000 mL | INTRAMUSCULAR | Status: AC
  Administered 2019-10-16: 0.5 mL via INTRAMUSCULAR
  Filled 2019-10-16: qty 0.5

## 2019-10-16 MED ORDER — HEPATITIS B VAC RECOMBINANT 10 MCG/ML IJ SUSP
1.0000 mL | Freq: Once | INTRAMUSCULAR | Status: AC
  Administered 2019-10-16: 10 ug via INTRAMUSCULAR
  Filled 2019-10-16: qty 1

## 2019-10-16 NOTE — Progress Notes (Signed)
UA placed in lab refrigerator for pick up.  

## 2019-10-16 NOTE — [facility] Suicide Risk Assessment (Signed)
[facility] INPATIENT:  Family/Significant Other Suicide Prevention Education  Suicide Prevention Education:  Patient Refusal for Family/Significant Other Suicide Prevention Education: The patient Jimmy Fisher has refused to provide written consent for family/significant other to be provided Family/Significant Other Suicide Prevention Education during admission and/or prior to discharge.  Physician notified.  SPE completed with patient, as patient refused to consent to family contact. SPI pamphlet provided to pt and pt was encouraged to share information with support network, ask questions, and talk about any concerns relating to SPE. Patient denies access to guns/firearms and verbalized understanding of information provided. Mobile Crisis information also provided to patient.    Maeola Sarah 10/16/2019, 11:13 AM
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
## 2019-10-16 NOTE — Progress Notes (Signed)
D:  Patient denied SI and HI, contracts for safety.  Denied A/V hallucinations.  Denied pain. A:  Medications administered per MD orders.  Emotional support and encouragement given patient. R:  Safety maintained with 15 minute checks.  

## 2019-10-16 NOTE — Progress Notes (Signed)
Recreation Therapy Notes  Animal-Assisted Activity (AAA) Program Checklist/Progress Notes Patient Eligibility Criteria Checklist & Daily Group note for Rec Tx Intervention  Date: 5.4.21 Time: 1430 Location: [address] Dayroom   AAA/T Program Assumption of Risk Form signed by Patient/ or Parent Legal Guardian  YES  Patient is free of allergies or sever asthma  YES   Patient reports no fear of animals  YES   Patient reports no history of cruelty to animals  YES   Patient understands his/her participation is voluntary YES   Patient washes hands before animal contact  YES  Patient washes hands after animal contact  YES   Education: Hand Washing, Appropriate Animal Interaction   Education Outcome: Acknowledges understanding/In group clarification offered/Needs additional education.   Clinical Observations/Feedback:  Pt did not attend group activity.    Anjalina Bergevin, LRT/CTRS         Jimmy Fisher 10/16/2019 3:26 PM 

## 2019-10-16 NOTE — Progress Notes (Signed)
Psychoeducational Group Note  Date:  10/16/2019 Time:  2153  Group Topic/Focus:  Wrap-Up Group:   The focus of this group is to help patients review their daily goal of treatment and discuss progress on daily workbooks.  Participation Level: Did Not Attend  Participation Quality:  Not Applicable  Affect:  Not Applicable  Cognitive:  Not Applicable  Insight:  Not Applicable  Engagement in Group: Not Applicable  Additional Comments:  The patient did not attend group this evening.   Hazle Coca S 10/16/2019, 9:53 PM
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
## 2019-10-16 NOTE — Plan of Care (Signed)
Sad and depressed but denying thoughts of self harm. Stayed in the dayroom with peers, attended group, had a snack and received medications. Patient avoided to discuss about his situation. Was encouraged to talk to staff as needed. Currently in bed resting. Safety monitored as recommended.

## 2019-10-16 NOTE — Progress Notes (Signed)
[facility] MD Progress Note  10/16/2019 11:24 AM Jimmy Fisher  MRN:  [id number] Subjective:  "I have a lot to adjust to."  Jimmy Fisher found sitting in the dayroom. He denies any withdrawal symptoms at this time and requests clonidine be discontinued due to fatigue. He presents with bright affect initially but admits that he has multiple life stressors to manage after he leaves the hospital. He found out prior to the overdose that he was HIV positive. 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 His son had also reached out to church members for support after the patient's overdose, and now his church community knows about his relapse. He is feeling ashamed about returning to his church community and recovery group, although he believes both his church and recovery group will be supportive. He states he had lost his faith and felt burnt out at his job for the last six months, so he is relieved to not be returning. He is unsure of future plans and unsure if he will return home at discharge due to recent marital stress. He continues to deny suicidal intent behind overdose and states, "I was just trying to get high," although prior ED notes indicate patient expressed suicidal ideation. He denies SI today- "That would be the coward's way out." Denies HI/AVH.  From admission H&P: History of opiate ( heroin) use disorder. Reports he had relapsed 8 months ago after a period of 8 years of sobriety. Over the last month had been using heroin ( IV) regularly/daily. He presented to ED following opiate/heroin overdose . Reportedly he lost consciousness and was apneic, requiring CPR initially and then Narcan.  States that he was recently diagnosed HIV (+) .  Principal Problem: <principal problem not specified> Diagnosis: Active Problems:   Major depressive disorder, recurrent severe without psychotic features (HCC)  Opioid dependence with opioid-induced mood disorder (HCC)  Total Time spent with patient: 15 minutes  Past Psychiatric History: See admission H&P  Past Medical History:  Past Medical History:  Diagnosis Date  . DVT (deep venous thrombosis) (HCC)   . GERD (gastroesophageal reflux disease)   . Hiatal hernia   . Hypercholesteremia   . Pulmonary embolism (HCC)    History reviewed. No pertinent surgical history. Family History: History reviewed. No pertinent family history. Family Psychiatric  History: See admission H&P Social History:  Social History   Substance and Sexual Activity  Alcohol Use No     Social History   Substance and Sexual Activity  Drug Use No    Social History   Socioeconomic History  . Marital status: Married    Spouse name: Not on file  . Number of children: Not on file  . Years of education: Not on file  . Highest education level: Not on file  Occupational History  . Not on file  Tobacco Use  . Smoking status: Never Smoker  . Smokeless tobacco: Never Used  Substance and Sexual Activity  . Alcohol use: No  . Drug use: No  . Sexual activity: Not on file  Other Topics Concern  . Not on file  Social History Narrative  . Not on file   Social Determinants of Health   Financial Resource Strain:   . Difficulty of Paying Living Expenses:   Food Insecurity:   . Worried About Programme researcher, broadcasting/film/video in the Last Year:   . [employer] of  Food in the Last Year:   Transportation Needs:   . Film/video editor (Medical):   Marland Kitchen Lack of Transportation (Non-Medical):   Physical Activity:   . Days of Exercise per Week:   . Minutes of Exercise per Session:   Stress:   . Feeling of Stress :   Social Connections:   . Frequency of Communication with Friends and Family:   . Frequency of Social Gatherings with Friends and Family:   . Attends Religious Services:   . Active Member of Clubs or Organizations:   . Attends Archivist Meetings:   Marland Kitchen Marital  Status:    Additional Social History:                         Sleep: Good  Appetite:  Good  Current Medications: Current Facility-Administered Medications  Medication Dose Route Frequency Provider Last Rate Last Admin  . acetaminophen (TYLENOL) tablet 650 mg  650 mg Oral Q6H PRN Patrecia Pour, NP      . bictegravir-emtricitabine-tenofovir AF (BIKTARVY) 50-200-25 MG per tablet 1 tablet  1 tablet Oral Daily Tommy Medal, Lavell Islam, MD   1 tablet at 10/16/19 [phone]  . cloNIDine (CATAPRES) tablet 0.1 mg  0.1 mg Oral QID Cobos, Myer Peer, MD   0.1 mg at 10/15/19 2109   Followed by  . cloNIDine (CATAPRES) tablet 0.1 mg  0.1 mg Oral BH-qamhs Cobos, Myer Peer, MD       Followed by  . [START ON 10/19/2019] cloNIDine (CATAPRES) tablet 0.1 mg  0.1 mg Oral QAC breakfast Cobos, Fernando A, MD      . dicyclomine (BENTYL) tablet 20 mg  20 mg Oral Q6H PRN Cobos, Myer Peer, MD      . hydrOXYzine (ATARAX/VISTARIL) tablet 25 mg  25 mg Oral Q6H PRN Cobos, Myer Peer, MD   25 mg at 10/14/19 1829  . loperamide (IMODIUM) capsule 2-4 mg  2-4 mg Oral PRN Cobos, Myer Peer, MD      . methocarbamol (ROBAXIN) tablet 500 mg  500 mg Oral Q8H PRN Cobos, Myer Peer, MD   500 mg at 10/15/19 2109  . naproxen (NAPROSYN) tablet 500 mg  500 mg Oral BID PRN Cobos, Myer Peer, MD   500 mg at 10/15/19 2109  . nicotine polacrilex (NICORETTE) gum 4 mg  4 mg Oral PRN Patrecia Pour, NP   4 mg at 10/15/19 2110  . ondansetron (ZOFRAN-ODT) disintegrating tablet 4 mg  4 mg Oral Q6H PRN Cobos, Myer Peer, MD      . pantoprazole (PROTONIX) EC tablet 40 mg  40 mg Oral Daily Patrecia Pour, NP   40 mg at 10/16/19 0803  . sertraline (ZOLOFT) tablet 50 mg  50 mg Oral Daily Cobos, Myer Peer, MD   50 mg at 10/16/19 0802  . traZODone (DESYREL) tablet 50 mg  50 mg Oral QHS PRN Rozetta Nunnery, NP   50 mg at 10/15/19 2109    Lab Results:  Results for orders placed or performed during the hospital encounter of 10/14/19 (from the  past 48 hour(s))  Cd4/cd8 (t-helper/t-suppressor cell)     Status: Abnormal   Collection Time: 10/15/19  6:19 AM  Result Value Ref Range   Total lymphocyte count 1,911 1,000 - 4,000 /uL   CD4% 27 (L) 33 - 65 %   CD4 absolute 512 400 - 1,790 /uL   CD8tox 54 (H) 12 - 40 %   CD8  T Cell Abs 1,024 (H) 190 - 1,000 /uL   Ratio 0.5 (L) 1.0 - 3.0    Comment: Performed at [facility], [address], [place], [place] [address]  Hepatitis C antibody     Status: None   Collection Time: 10/15/19  6:19 AM  Result Value Ref Range   HCV Ab NON REACTIVE NON REACTIVE    Comment: (NOTE) Nonreactive HCV antibody screen is consistent with no HCV infections,  unless recent infection is suspected or other evidence exists to indicate HCV infection. Performed at [facility], [address] [place], [place] [address]   Hepatitis B surface antigen     Status: None   Collection Time: 10/15/19  6:19 AM  Result Value Ref Range   Hepatitis B Surface Ag NON REACTIVE NON REACTIVE    Comment: Performed at [facility], [address], [place], [place] [address]  Hepatitis B surface antibody,quantitative     Status: Abnormal   Collection Time: 10/15/19  6:19 AM  Result Value Ref Range   Hepatitis B-Post <3.1 (L) Immunity>9.9 mIU/mL    Comment: (NOTE)  Status of Immunity                     Anti-HBs Level  ------------------                     -------------- Inconsistent with Immunity                   0.0 - 9.9 Consistent with Immunity                          >9.9 Performed At: [facility] [address] [place], [place] [id number] Jolene Schimke MD GG:[id number]   Hepatitis A antibody, total     Status: None   Collection Time: 10/15/19  6:19 AM  Result Value Ref Range   hep A Total Ab NON REACTIVE NON REACTIVE    Comment: Performed at [facility], [address] [place], [place] [address]    Blood Alcohol level:  Lab Results  Component Value Date    ETH 37 (H) 10/13/2019    Metabolic Disorder Labs: No results found for: HGBA1C, MPG No results found for: PROLACTIN No results found for: CHOL, TRIG, HDL, CHOLHDL, VLDL, LDLCALC  Physical Findings: AIMS:  , ,  ,  ,    CIWA:  CIWA-Ar Total: 7 COWS:  COWS Total Score: 0  Musculoskeletal: Strength & Muscle Tone: within normal limits Gait & Station: normal Patient leans: N/A  Psychiatric Specialty Exam: Physical Exam  Nursing note and vitals reviewed. Constitutional: He is oriented to person, place, and time. He appears well-developed and well-nourished.  Cardiovascular: Normal rate.  Respiratory: Effort normal.  Neurological: He is alert and oriented to person, place, and time.    Review of Systems  Constitutional: Negative.   Respiratory: Negative for cough and shortness of breath.   Psychiatric/Behavioral: Negative for agitation, behavioral problems, confusion, dysphoric mood, hallucinations, self-injury, sleep disturbance and suicidal ideas. The patient is not nervous/anxious and is not hyperactive.     Blood pressure 116/72, pulse (!) 58, temperature 98.4 F (36.9 C), temperature source Oral, resp. rate 20, height 5\' 6"  (1.676 m), weight 81.6 kg, SpO2 98 %.Body mass index is 29.05 kg/m.  General Appearance: Casual  Eye Contact:  Good  Speech:  Normal Rate  Volume:  Normal  Mood:  Anxious  Affect:  Full Range  Thought Process:  Coherent  Orientation:  Full (Time, Place, and Person)  Thought Content:  Logical  Suicidal Thoughts:  No  Homicidal Thoughts:  No  Memory:  Immediate;   Good Recent;   Good  Judgement:  Intact  Insight:  Fair  Psychomotor Activity:  Normal  Concentration:  Concentration: Good and Attention Span: Good  Recall:  Good  Fund of Knowledge:  Good  Language:  Good  Akathisia:  No  Handed:  Right  AIMS (if indicated):     Assets:  Communication Skills Desire for Improvement Housing Social Support  ADL's:  Intact  Cognition:  WNL  Sleep:   Number of Hours: 5.5     Treatment Plan Summary: Daily contact with patient to assess and evaluate symptoms and progress in treatment and Medication management   Continue inpatient hospitalization.  Discontinue clonidine Continue Zoloft 50 mg PO daily for depression/anxiety Continue Biktarvy 1 tablet PO daily for HIV Continue Vistaril 25 mg PO Q6HR PRN anxiety Continue Protonix 40 mg PO daily for GERD Continue trazodone 50 mg PO QHS PRN insomnia  Patient will participate in the therapeutic group milieu.  Discharge disposition in progress.   Aldean Baker, NP 10/16/2019, 11:24 AM

## 2019-10-16 NOTE — Progress Notes (Signed)
Pt continues to be upset about his new situations he is dealing with now.    10/16/19 2300  Psych Admission Type (Psych Patients Only)  Admission Status Involuntary  Psychosocial Assessment  Patient Complaints Anxiety;Irritability  Eye Contact Fair  Facial Expression Sad;Worried  Affect Appropriate to circumstance  Speech Logical/coherent  Interaction Assertive  Motor Activity Other (Comment) (WNL)  Appearance/Hygiene In scrubs  Behavior Characteristics Anxious;Irritable  Mood Depressed;Anxious;Irritable  Thought Process  Coherency WDL  Content WDL  Delusions WDL  Perception WDL  Hallucination None reported or observed  Judgment Poor  Confusion None  Danger to Self  Current suicidal ideation? Denies  Self-Injurious Behavior No self-injurious ideation or behavior indicators observed or expressed   Agreement Not to Harm Self Yes  Description of Agreement verbal  Danger to Others  Danger to Others None reported or observed

## 2019-10-16 NOTE — Plan of Care (Signed)
Nurse discussed anxiety, depression and coping skills with patient.  

## 2019-10-17 LAB — RPR: RPR Ser Ql: NONREACTIVE

## 2019-10-17 MED ORDER — ALUM & MAG HYDROXIDE-SIMETH 200-200-20 MG/5ML PO SUSP
30.0000 mL | Freq: Four times a day (QID) | ORAL | Status: DC | PRN
  Administered 2019-10-17: 17:00:00 30 mL via ORAL

## 2019-10-17 NOTE — Progress Notes (Signed)
INFECTIOUS DISEASE PROGRESS NOTE  ID: Jimmy Fisher is a 39 y.o. male with  Active Problems:   Major depressive disorder, recurrent severe without psychotic features (HCC)   Opioid dependence with opioid-induced mood disorder (HCC)  Subjective: No complaints, in group room.  No difficulty with ART.   Abtx:  Anti-infectives (From admission, onward)   Start     Dose/Rate Route Frequency Ordered Stop   10/14/19 1830  bictegravir-emtricitabine-tenofovir AF (BIKTARVY) 50-200-25 MG per tablet 1 tablet     1 tablet Oral Daily 10/14/19 1823        Medications:  Scheduled: . bictegravir-emtricitabine-tenofovir AF  1 tablet Oral Daily  . pantoprazole  40 mg Oral Daily  . sertraline  50 mg Oral Daily    Objective: Vital signs in last 24 hours:     General appearance: alert, cooperative and no distress  Lab Results No results for input(s): WBC, HGB, HCT, NA, K, CL, CO2, BUN, CREATININE, GLU in the last 72 hours.  Invalid input(s): PLATELETS Liver Panel No results for input(s): PROT, ALBUMIN, AST, ALT, ALKPHOS, BILITOT, BILIDIR, IBILI in the last 72 hours. Sedimentation Rate No results for input(s): ESRSEDRATE in the last 72 hours. C-Reactive Protein No results for input(s): CRP in the last 72 hours.  Microbiology: Recent Results (from the past 240 hour(s))  Respiratory Panel by RT PCR (Flu A&B, Covid) - Nasopharyngeal Swab     Status: None   Collection Time: 10/13/19  2:42 AM   Specimen: Nasopharyngeal Swab  Result Value Ref Range Status   SARS Coronavirus 2 by RT PCR NEGATIVE NEGATIVE Final    Comment: (NOTE) SARS-CoV-2 target nucleic acids are NOT DETECTED. The SARS-CoV-2 RNA is generally detectable in upper respiratoy specimens during the acute phase of infection. The lowest concentration of SARS-CoV-2 viral copies this assay can detect is 131 copies/mL. A negative result does not preclude SARS-Cov-2 infection and should not be used as the sole basis for  treatment or other patient management decisions. A negative result may occur with  improper specimen collection/handling, submission of specimen other than nasopharyngeal swab, presence of viral mutation(s) within the areas targeted by this assay, and inadequate number of viral copies (<131 copies/mL). A negative result must be combined with clinical observations, patient history, and epidemiological information. The expected result is Negative. Fact Sheet for Patients:  https://www.moore.com/ Fact Sheet for Healthcare Providers:  https://www.young.biz/ This test is not yet ap proved or cleared by the [place] FDA and  has been authorized for detection and/or diagnosis of SARS-CoV-2 by FDA under an Emergency Use Authorization (EUA). This EUA will remain  in effect (meaning this test can be used) for the duration of the COVID-19 declaration under Section 564(b)(1) of the Act, 21 U.S.C. section 360bbb-3(b)(1), unless the authorization is terminated or revoked sooner.    Influenza A by PCR NEGATIVE NEGATIVE Final   Influenza B by PCR NEGATIVE NEGATIVE Final    Comment: (NOTE) The Xpert Xpress SARS-CoV-2/FLU/RSV assay is intended as an aid in  the diagnosis of influenza from Nasopharyngeal swab specimens and  should not be used as a sole basis for treatment. Nasal washings and  aspirates are unacceptable for Xpert Xpress SARS-CoV-2/FLU/RSV  testing. Fact Sheet for Patients: https://www.moore.com/ Fact Sheet for Healthcare Providers: https://www.young.biz/ This test is not yet approved or cleared by the [place] FDA and  has been authorized for detection and/or diagnosis of SARS-CoV-2 by  FDA under an Emergency Use Authorization (EUA). This EUA will remain  in effect (meaning this test can be used) for the duration of the  Covid-19 declaration under Section 564(b)(1) of the Act, 21  U.S.C. section  360bbb-3(b)(1), unless the authorization is  terminated or revoked. Performed at [facility], [address], [place], [place] [address]     Studies/Results: No results found.   Assessment/Plan: HIV+ Opioid use d/o  Total days of antibiotics: biktarvy  Vaccines started HIV RNA is 223k, await genotype.  He has f/u to see me on 10-30-19 at 2:30 pm I gave him this information and asked him to please let me know if there are issues between now and then.  available as needed.          Bobby Rumpf MD, FACP Infectious Diseases (pager) [phone] [email or web address] 10/17/2019, 1:00 PM  LOS: 3 days

## 2019-10-17 NOTE — Progress Notes (Signed)
Recreation Therapy Notes  Date:  5.5.21 Time: 0930 Location: [address] Group Room  Group Topic: Stress Management  Goal Area(s) Addresses:  Patient will identify positive stress management techniques. Patient will identify benefits of using stress management post d/c.  Intervention: Stress Management  Activity :  Progressive Muscle Relaxation.  LRT read Fisher script that guided patients in tensing and relaxing each muscle group individually.  Patients were to listen and follow along as script was read to engage in activity.  Education:  Stress Management, Discharge Planning.   Education Outcome: Acknowledges Education  Clinical Observations/Feedback: Pt did not attend activity.    Jimmy Fisher, LRT/CTRS         Jimmy Fisher 10/17/2019 12:05 PM 

## 2019-10-17 NOTE — Progress Notes (Signed)
[facility] MD Progress Note  10/17/2019 1:11 PM Jimmy Fisher  MRN:  [id number]  Subjective: Jimmy Fisher reports, "I'm doing well. I'm good. When am I getting discharged from here?".  Objective: Jimmy Fisher is a 39 year old Caucasian male with hx of polysubstance use disorders including opiate drugs (heroin). Reports, had relapsed 8 months ago after a period of 8 years of sobriety. Over the last month had been using heroin via (IV) regularly & on daily basis. He presented to ED following opiate/heroin overdose . Reportedly he lost consciousness and was apneic, requiring CPR initially and then Narcan. States that he was recently diagnosed as HIV (+) . Jimmy Fisher is seen, chart reviewed. The chart findings discussed with the treatment team. He presents alert, oriented x 3 & aware of situation. He is visible on the unit, attending group sessions. He reports doing well & feeling good. He says he has been in the hospital since Sunday. He adds that he was brought to the hospital & closely monitored because everyone were thinking he was suicidal or attempted to commit suicide. He says no, he was not trying to kill himself, rather was trying to get high & things got out of hand. He currently denies any withdrawal symptoms . He presents with bright affect & a euthymic mood. He feels he is stable enough to be thinking about getting discharged from the hospital.  He denies any suicidal/homicidal ideations, intent or plans. He denies any AVH, delusional thoughts or paranoia. He does not appear to be responding to any internal stimuli. Jimmy Fisher is in agreement to continue his current plan of care as already in progress.  Principal Problem: Major depressive disorder, recurrent severe without psychotic features (HCC)  Diagnosis: Principal Problem:   Major depressive disorder, recurrent severe without psychotic features (HCC) Active Problems:   Opioid dependence with opioid-induced mood disorder (HCC)  Total Time spent with  patient: 15 minutes  Past Psychiatric History: See admission H&P  Past Medical History:  Past Medical History:  Diagnosis Date  . DVT (deep venous thrombosis) (HCC)   . GERD (gastroesophageal reflux disease)   . Hiatal hernia   . Hypercholesteremia   . Pulmonary embolism (HCC)    History reviewed. No pertinent surgical history.  Family History: History reviewed. No pertinent family history.  Family Psychiatric  History: See admission H&P  Social History:  Social History   Substance and Sexual Activity  Alcohol Use No     Social History   Substance and Sexual Activity  Drug Use No    Social History   Socioeconomic History  . Marital status: Married    Spouse name: Not on file  . Number of children: Not on file  . Years of education: Not on file  . Highest education level: Not on file  Occupational History  . Not on file  Tobacco Use  . Smoking status: Never Smoker  . Smokeless tobacco: Never Used  Substance and Sexual Activity  . Alcohol use: No  . Drug use: No  . Sexual activity: Not on file  Other Topics Concern  . Not on file  Social History Narrative  . Not on file   Social Determinants of Health   Financial Resource Strain:   . Difficulty of Paying Living Expenses:   Food Insecurity:   . Worried About Programme researcher, broadcasting/film/video in the Last Year:   . Barista in the Last Year:   Transportation Needs:   . Freight forwarder (Medical):   Marland Kitchen  Lack of Transportation (Non-Medical):   Physical Activity:   . Days of Exercise per Week:   . Minutes of Exercise per Session:   Stress:   . Feeling of Stress :   Social Connections:   . Frequency of Communication with Friends and Family:   . Frequency of Social Gatherings with Friends and Family:   . Attends Religious Services:   . Active Member of Clubs or Organizations:   . Attends Archivist Meetings:   Marland Kitchen Marital Status:    Additional Social History:   Sleep: Good  Appetite:   Good  Current Medications: Current Facility-Administered Medications  Medication Dose Route Frequency Provider Last Rate Last Admin  . acetaminophen (TYLENOL) tablet 650 mg  650 mg Oral Q6H PRN Patrecia Pour, NP      . bictegravir-emtricitabine-tenofovir AF (BIKTARVY) 50-200-25 MG per tablet 1 tablet  1 tablet Oral Daily Tommy Medal, Lavell Islam, MD   1 tablet at 10/17/19 [id number]  . dicyclomine (BENTYL) tablet 20 mg  20 mg Oral Q6H PRN Cobos, Fernando A, MD      . hydrOXYzine (ATARAX/VISTARIL) tablet 25 mg  25 mg Oral Q6H PRN Cobos, Myer Peer, MD   25 mg at 10/14/19 1829  . loperamide (IMODIUM) capsule 2-4 mg  2-4 mg Oral PRN Cobos, Myer Peer, MD      . methocarbamol (ROBAXIN) tablet 500 mg  500 mg Oral Q8H PRN Cobos, Myer Peer, MD   500 mg at 10/15/19 2109  . naproxen (NAPROSYN) tablet 500 mg  500 mg Oral BID PRN Cobos, Myer Peer, MD   500 mg at 10/15/19 2109  . nicotine polacrilex (NICORETTE) gum 4 mg  4 mg Oral PRN Patrecia Pour, NP   4 mg at 10/16/19 1737  . ondansetron (ZOFRAN-ODT) disintegrating tablet 4 mg  4 mg Oral Q6H PRN Cobos, Myer Peer, MD      . pantoprazole (PROTONIX) EC tablet 40 mg  40 mg Oral Daily Patrecia Pour, NP   40 mg at 10/17/19 0755  . sertraline (ZOLOFT) tablet 50 mg  50 mg Oral Daily Cobos, Myer Peer, MD   50 mg at 10/17/19 0755  . traZODone (DESYREL) tablet 50 mg  50 mg Oral QHS PRN Rozetta Nunnery, NP   50 mg at 10/15/19 2109    Lab Results:  Results for orders placed or performed during the hospital encounter of 10/14/19 (from the past 48 hour(s))  RPR     Status: None   Collection Time: 10/16/19  6:20 PM  Result Value Ref Range   RPR Ser Ql NON REACTIVE NON REACTIVE    Comment: Performed at [facility], [address], [place], [place] [address]    Blood Alcohol level:  Lab Results  Component Value Date   ETH 37 (H) 24/40/1027   Metabolic Disorder Labs: No results found for: HGBA1C, MPG No results found for: PROLACTIN No results found  for: CHOL, TRIG, HDL, CHOLHDL, VLDL, LDLCALC  Physical Findings: AIMS:  , ,  ,  ,    CIWA:  CIWA-Ar Total: 7 COWS:  COWS Total Score: 5  Musculoskeletal: Strength & Muscle Tone: within normal limits Gait & Station: normal Patient leans: N/A  Psychiatric Specialty Exam: Physical Exam  Nursing note and vitals reviewed. Constitutional: He is oriented to person, place, and time. He appears well-developed and well-nourished.  HENT:  Head: Normocephalic.  Eyes: Pupils are equal, round, and reactive to light.  Cardiovascular: Normal rate.  Respiratory: Effort normal.  Genitourinary:    Genitourinary Comments: Deferred   Musculoskeletal:        General: Normal range of motion.     Cervical back: Normal range of motion.  Neurological: He is alert and oriented to person, place, and time.  Skin: Skin is warm and dry.    Review of Systems  Constitutional: Negative.  Negative for chills, diaphoresis and fever.  HENT: Negative for congestion, rhinorrhea, sneezing and sore throat.   Eyes: Negative for discharge.  Respiratory: Negative for cough, chest tightness, shortness of breath and wheezing.   Cardiovascular: Negative for chest pain and palpitations.  Gastrointestinal: Negative for diarrhea, nausea and vomiting.  Endocrine: Negative for cold intolerance.  Genitourinary: Negative for difficulty urinating.  Musculoskeletal: Negative for myalgias.  Allergic/Immunologic: Negative for environmental allergies and food allergies.       Allergies: NKDA  Neurological: Negative for dizziness, tremors, seizures, syncope, weakness, light-headedness, numbness and headaches.  Psychiatric/Behavioral: Negative for agitation, behavioral problems, confusion, decreased concentration, dysphoric mood, hallucinations, self-injury, sleep disturbance and suicidal ideas. The patient is not nervous/anxious and is not hyperactive.     Blood pressure 110/65, pulse (!) 52, temperature 97.7 F (36.5 C),  temperature source Oral, resp. rate 18, height 5\' 6"  (1.676 m), weight 81.6 kg, SpO2 98 %.Body mass index is 29.05 kg/m.  General Appearance: Casual  Eye Contact:  Good  Speech:  Normal Rate  Volume:  Normal  Mood:  Euthymic  Affect:  Appropriate  Thought Process:  Coherent and Descriptions of Associations: Intact  Orientation:  Full (Time, Place, and Person)  Thought Content:  Logical  Suicidal Thoughts:  No  Homicidal Thoughts:  No  Memory:  Immediate;   Good Recent;   Good  Judgement:  Intact  Insight:  Fair  Psychomotor Activity:  Normal  Concentration:  Concentration: Good and Attention Span: Good  Recall:  Good  Fund of Knowledge:  Good  Language:  Good  Akathisia:  No  Handed:  Right  AIMS (if indicated):     Assets:  Communication Skills Desire for Improvement Housing Social Support  ADL's:  Intact  Cognition:  WNL  Sleep:  Number of Hours: 6   Treatment Plan Summary: Daily contact with patient to assess and evaluate symptoms and progress in treatment and Medication management   - Continue inpatient hospitalization. - Will continue today 10/17/2019 plan as below except where it is noted.  Discontinued clonidine Continue Zoloft 50 mg po daily for depression/anxiety Continue Biktarvy 1 tablet po daily for HIV infection. Continue Vistaril 25 mg po Q 6 hrs prn for anxiety Continue Protonix 40 mg po daily for GERD Continue trazodone 50 mg PO QHS PRN insomnia  Patient will participate in the therapeutic group milieu.  Discharge disposition in progress.   12/17/2019, NP, PMHNP, FNP-BC 10/17/2019, 1:11 PMPatient ID: 12/17/2019, male   DOB: [birth year], 39 y.o.   MRN: [id number]

## 2019-10-17 NOTE — Progress Notes (Signed)
[facility] Group Notes:  (Nursing/MHT/Case Management/Adjunct)  Date:  10/17/2019  Time: 2030 Type of Therapy:  wrap up group  Participation Level:  Active  Participation Quality:  Appropriate, Attentive, Sharing and Supportive  Affect:  Depressed  Cognitive:  Appropriate  Insight:  Improving  Engagement in Group:  Engaged  Modes of Intervention:  Clarification, Education and Support  Summary of Progress/Problems: Positive thinking and positive change were discussed.   Marcille Buffy 10/17/2019, 9:17 PM

## 2019-10-17 NOTE — Progress Notes (Signed)
Pt is alert and oriented to person, place, time and situation. Pt is calm, cooperative with care and unit programming, denies suicidal and homicidal ideation, denies hallucinations, denies feelings of depression and anxiety. Pt has been social with peers and staff, superficially bright, and uses some sarcasm when talking about the triggers for admission. Pt is medication compliant. Will continue to monitor pt per Q15 minute face checks and monitor for safety and progress.

## 2019-10-17 NOTE — Progress Notes (Addendum)
   10/17/19 2253  Psych Admission Type (Psych Patients Only)  Admission Status Involuntary  Psychosocial Assessment  Patient Complaints Other (Comment) (arm where he had an IV is painful)  Eye Contact Fair  Facial Expression Flat  Affect Anxious  Speech Logical/coherent  Interaction Assertive  Appearance/Hygiene Unremarkable  Behavior Characteristics Cooperative;Appropriate to situation  Mood Anxious  Thought Process  Coherency WDL  Content WDL  Delusions WDL  Perception WDL  Hallucination None reported or observed  Judgment WDL  Confusion WDL  Danger to Self  Current suicidal ideation? Denies  Danger to Others  Danger to Others None reported or observed  Anxious about discharge in the am stating that he has a lot of things to deal with once discharged. Complained tonight about anticubital area of rt arm hurting from and IV stick there prior to coming here. Area has a needle bruise and some slight bruising in the area but no redness or swelling noted. Given ice pack and Naprosyn. No s/s of infection

## 2019-10-17 NOTE — [facility] Group Notes (Signed)
10/17/2019 8:45am Type of Group and Topic: Psychoeducational Group: Discharge Planning   Participation Level: Active  Description of Group Discharge planning group reviews patient's anticipated discharge plans and assists patients to anticipate and address any barriers to wellness/recovery in the community. Suicide prevention education is reviewed with patients in group. Therapeutic Goals 1. Patients will state their anticipated discharge plan and mental health aftercare 2. Patients will identify potential barriers to wellness in the community setting 3. Patients will engage in problem solving, solution focused discussion of ways to anticipate and address barriers to wellness/recovery   Summary of Patient Progress Plan for Discharge/Comments: [name] was engaged and participated throughout the group session. Jimmy Fisher reports he is ready to return home. He reports that he plans to follow up with his recommended outpatient appointments. Jimmy Fisher states that he is ready to work on processing his "new reality" with his wife and family.   Transportation Means: Patient reports his wife will pick him up at discharge.    Supports: Church family, wife, friends    Therapeutic Modalities: Motivational Interviewing     Baldo Daub, MSW, LCSWA Clinical Social Worker [facility]  Phone: [phone] 10/17/2019 2:34 PM

## 2019-10-18 ENCOUNTER — Telehealth: Payer: Self-pay | Admitting: Pharmacy Technician

## 2019-10-18 DIAGNOSIS — B20 Human immunodeficiency virus [HIV] disease: Secondary | ICD-10-CM

## 2019-10-18 MED ORDER — BICTEGRAVIR-EMTRICITAB-TENOFOV 50-200-25 MG PO TABS: 1.0000 | ORAL_TABLET | Freq: Every day | ORAL | 0 refills | Status: DC

## 2019-10-18 MED ORDER — HYDROXYZINE HCL 25 MG PO TABS: 25.0000 mg | ORAL_TABLET | Freq: Four times a day (QID) | ORAL | 0 refills | Status: DC | PRN

## 2019-10-18 MED ORDER — TRAZODONE HCL 50 MG PO TABS: 50.0000 mg | ORAL_TABLET | Freq: Every evening | ORAL | 0 refills | Status: DC | PRN

## 2019-10-18 MED ORDER — NICOTINE POLACRILEX 4 MG MT GUM: 4.0000 mg | CHEWING_GUM | OROMUCOSAL | 0 refills | Status: DC | PRN

## 2019-10-18 MED ORDER — SERTRALINE HCL 50 MG PO TABS: 50.0000 mg | ORAL_TABLET | Freq: Every day | ORAL | 0 refills | Status: DC

## 2019-10-18 MED FILL — BIKTARVY 50-200-25 MG TABS: 50-200-25 | 30 days supply | Qty: 30 | Fill #0

## 2019-10-18 NOTE — Progress Notes (Signed)
  [facility] Adult Case Management Discharge Plan :  Will you be returning to the same living situation after discharge:  Yes,  patient is returning home with his wife At discharge, do you have transportation home?: Yes,  patient's wife is picking him up Do you have the ability to pay for your medications: No.  Release of information consent forms completed and in the chart;  Patient's signature needed at discharge.  Patient to Follow up at: Follow-up Information    [facility] for Infectious Disease. Go on 10/30/2019.   Specialty: Infectious Diseases Why: You are scheduled for 2 appointments on 10/30/19.  You have a financial appointment at 2:00 pm, and a medical appt at 2:30 pm with Dr. Johny Sax on 10/30/19.  Both of these appointments will be held in person.  Please bring proof of any income. Contact information: [address] [place], [address] [place] [id number] mc [place] [address] [phone]       [facility] Follow up on 10/22/2019.   Why: You have an appointment on 10/22/19 at 9:00 am.  This will be a virtual tele-health appointment. Please be sure to have your discharge paperwork available during this visit.  Contact information: [address] [place] [address] [phone]           Next level of care provider has access to [facility] Link:yes  Safety Planning and Suicide Prevention discussed: Yes,  with the patient's wife     Has patient been referred to the Quitline?: N/A patient is not a smoker  Patient has been referred for addiction treatment: Pt. refused referral  Maeola Sarah, LCSWA 10/18/2019, 9:39 AM

## 2019-10-18 NOTE — Progress Notes (Signed)
Discharge Note:  Patient discharged home with wife.  Patient denied SI and HI.  Denied A/V hallucinations.  Suicide prevention information given and discussed with patient who stated he understood and had no questions.  Patient stated he received all his belongings, clothing, toiletries, misc items, etc.  Patient stated he appreciated all assistance received from [facility] staff.  All required discharge information given at discharge.

## 2019-10-18 NOTE — [facility] Suicide Risk Assessment (Signed)
[facility] Discharge Suicide Risk Assessment   Principal Problem: Major depressive disorder, recurrent severe without psychotic features (HCC) Discharge Diagnoses: Principal Problem:   Major depressive disorder, recurrent severe without psychotic features (HCC) Active Problems:   Opioid dependence with opioid-induced mood disorder (HCC)   HIV disease (HCC)   Total Time spent with patient: 15 minutes  Musculoskeletal: Strength & Muscle Tone: within normal limits Gait & Station: normal Patient leans: N/A  Psychiatric Specialty Exam: Review of Systems  All other systems reviewed and are negative.   Blood pressure 134/82, pulse 67, temperature 97.6 F (36.4 C), temperature source Oral, resp. rate 18, height 5\' 6"  (1.676 m), weight 81.6 kg, SpO2 99 %.Body mass index is 29.05 kg/m.  General Appearance: Casual  Eye Contact::  Good  Speech:  Normal Rate409  Volume:  Normal  Mood:  Euthymic  Affect:  Congruent  Thought Process:  Coherent and Descriptions of Associations: Intact  Orientation:  Full (Time, Place, and Person)  Thought Content:  Logical  Suicidal Thoughts:  No  Homicidal Thoughts:  No  Memory:  Immediate;   Good Recent;   Good Remote;   Good  Judgement:  Intact  Insight:  Present  Psychomotor Activity:  Normal  Concentration:  Good  Recall:  Good  Fund of Knowledge:Good  Language: Good  Akathisia:  Negative  Handed:  Right  AIMS (if indicated):     Assets:  Desire for Improvement Housing Resilience Social Support Talents/Skills Transportation  Sleep:  Number of Hours: 5.25  Cognition: WNL  ADL's:  Intact   Mental Status Per Nursing Assessment::   On Admission:  Suicidal ideation indicated by patient, Self-harm behaviors, Intention to act on suicide plan  Demographic Factors:  Male, Caucasian and Unemployed  Loss Factors: Decline in physical health  Historical Factors: Impulsivity  Risk Reduction Factors:   Sense of responsibility to family, Living  with another person, especially a relative, Positive social support and Positive coping skills or problem solving skills  Continued Clinical Symptoms:  Depression:   Comorbid alcohol abuse/dependence Impulsivity Alcohol/Substance Abuse/Dependencies Medical Diagnoses and Treatments/Surgeries  Cognitive Features That Contribute To Risk:  None    Suicide Risk:  Minimal: No identifiable suicidal ideation.  Patients presenting with no risk factors but with morbid ruminations; may be classified as minimal risk based on the severity of the depressive symptoms  Follow-up Information    [facility] for Infectious Disease. Go on 10/30/2019.   Specialty: Infectious Diseases Why: You are scheduled for 2 appointments on 10/30/19.  You have a financial appointment at 2:00 pm, and a medical appt at 2:30 pm with Dr. 11/01/19 on 10/30/19.  Both of these appointments will be held in person.  Please bring proof of any income. Contact information: [address] [place], [address] [place] [address] [place] [phone]       [facility] Follow up on 10/22/2019.   Why: You have an appointment on 10/22/19 at 9:00 am.  This will be a virtual tele-health appointment. Contact information: [address] [place] [phone]           Plan Of Care/Follow-up recommendations:  Activity:  ad lib  [phone], MD 10/18/2019, 7:54 AM

## 2019-10-18 NOTE — Progress Notes (Signed)
Pharmacy - Antimicrobial Stewardship (Brief Note)  Medication assistance for biktarvy (no medication insurance coverage)  Application sent to [place], Laroy Apple ([employer]).  He was approved for immediate need. 30-day Prescription sent to [facility].  He is being discharged from [facility] in about an hour, so he stated he will go pick up prescription at [facility]  I reviewed how to take medication and common side effects.  He is aware that further assistance will be discussed at his May 18th appt at [facility] (can also discuss PreP therapy for wife as well at that time)  Juliette Alcide, PharmD, BCPS.   Work Cell: [phone] 10/18/2019 11:10 AM

## 2019-10-18 NOTE — [facility] Suicide Risk Assessment (Signed)
[facility] INPATIENT:  Family/Significant Other Suicide Prevention Education  Suicide Prevention Education:  Education Completed; with wife, Juanita Devincent [phone]) has been identified by the patient as the family member/significant other with whom the patient will be residing, and identified as the person(s) who will aid the patient in the event of a mental health crisis (suicidal ideations/suicide attempt).  With written consent from the patient, the family member/significant other has been provided the following suicide prevention education, prior to the and/or following the discharge of the patient.  The suicide prevention education provided includes the following:  Suicide risk factors  Suicide prevention and interventions  National Suicide Hotline telephone number  [facility] assessment telephone number  [facility] Emergency Assistance 911  [facility] and/or Residential Mobile Crisis Unit telephone number  Request made of family/significant other to:  Remove weapons (e.g., guns, rifles, knives), all items previously/currently identified as safety concern.    Remove drugs/medications (over-the-counter, prescriptions, illicit drugs), all items previously/currently identified as a safety concern.  The family member/significant other verbalizes understanding of the suicide prevention education information provided.  The family member/significant other agrees to remove the items of safety concern listed above.  Victorino Dike reports she is agreeable with the patient discharging home today. She reports she and her husband (the patient) will take "things one day at a time". She reports she is agreeable with the patient's current discharge plans and recommendations and does not have any further questions or concerns at this time.   Victorino Dike states that she will ensure the patient remains compliant with recommendations and follow up appointments. Victorino Dike reports she will  pick the patient up today around noon. CSW will continue to follow for an appropriate discharge.   Maeola Sarah 10/18/2019, 9:36 AM

## 2019-10-18 NOTE — Progress Notes (Signed)
D:  Patient's self inventory sheet, patient sleeps good, sleep medication helpful.  Poor appetite, normal energy level, good concentration.  Rated depression 3, hopeless 4, anxiety 5.  Denied withdrawals.  Denied SI.  Physical problems, arm pain from IV in hospital.  Worst pain #5 in past 24 hours.  Plan and goal today is discharge.  Does have discharge plans. A:  Medications administered per MD orders.  Emotional support and encouragement given patient. R:  Denied SI and HI, contracts for safety.  Denied A/V hallucinations.  Denied pain.  Safety maintained with 15 minute checks.
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
## 2019-10-18 NOTE — Telephone Encounter (Addendum)
[facility] Patient Advocate Encounter    Findings of the benefits investigation:   Insurance: no insurance coverage found  Previous NCMED recipient ID listed as [id number] M but showing inactive  Pharmacy system unable to find any additional coverage  Patient was given [employer] Advancing Access in the hospital and picked up at [facility]. Coverage effective until 11/17/2019 BIN: [id number] PCN: [id number] ID: [id number] Group: [id number]  [name], CPhT Specialty Pharmacy Patient [facility] Phone: [phone] Fax: [phone] 10/18/2019 9:05 AM

## 2019-10-21 ENCOUNTER — Other Ambulatory Visit: Payer: Self-pay

## 2019-10-21 ENCOUNTER — Emergency Department (HOSPITAL_COMMUNITY)
Admission: EM | Admit: 2019-10-21 | Discharge: 2019-10-21 | Disposition: A | Discharge status: Home / Self Care | Payer: Self-pay | Attending: Emergency Medicine | Admitting: Emergency Medicine

## 2019-10-21 ENCOUNTER — Encounter (HOSPITAL_COMMUNITY): Payer: Self-pay | Admitting: Emergency Medicine

## 2019-10-21 DIAGNOSIS — R0989 Other specified symptoms and signs involving the circulatory and respiratory systems: Secondary | ICD-10-CM | POA: Insufficient documentation

## 2019-10-21 DIAGNOSIS — Z79899 Other long term (current) drug therapy: Secondary | ICD-10-CM | POA: Insufficient documentation

## 2019-10-21 DIAGNOSIS — Z86718 Personal history of other venous thrombosis and embolism: Secondary | ICD-10-CM | POA: Insufficient documentation

## 2019-10-21 MED ORDER — ENOXAPARIN SODIUM 80 MG/0.8ML ~~LOC~~ SOLN
80.0000 mg | Freq: Once | SUBCUTANEOUS | Status: AC
  Administered 2019-10-21: 80 mg via SUBCUTANEOUS
  Filled 2019-10-21: qty 0.8

## 2019-10-21 NOTE — ED Triage Notes (Addendum)
Patient presents stating he has a possible blood clot in his arm. Patient states he had an IV in for his last visit and ever since it was taken out, he has had worsening forearm and hand pain. Patient states he has had blood clots and DVTs before and the pain is similar. No swelling noted or redness noted. Denies numbness or tingling

## 2019-10-21 NOTE — ED Provider Notes (Signed)
[facility] DEPT Provider Note: Georgena Spurling, MD, FACEP  CSN: [id number] MRN: [id number] ARRIVAL: 10/21/19 at [place]: [place]  Arm Pain   HISTORY OF PRESENT ILLNESS  10/21/19 12:31 AM Jimmy Fisher is a 39 y.o. 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  He is here with pain in his right wrist due to the site of a recent IV.  He notices some prominent veins in his volar wrist and notes associated pain which he rates as a 7 out of 10, worse with palpation or movement.  He is concerned he may have another DVT in his right upper extremity.  He denies chest pain or shortness of breath.   Past Medical History:  Diagnosis Date  . DVT (deep venous thrombosis) ([place])   . GERD (gastroesophageal reflux disease)   . Hiatal hernia   . Hypercholesteremia   . Pulmonary embolism ([place])     History reviewed. No pertinent surgical history.  No family history on file.  Social History   Tobacco Use  . Smoking status: Never Smoker  . Smokeless tobacco: Never Used  Substance Use Topics  . Alcohol use: No  . Drug use: No    Prior to Admission medications   Medication Sig Start Date End Date Taking? Authorizing Provider  bictegravir-emtricitabine-tenofovir AF (BIKTARVY) 50-200-25 MG TABS tablet Take 1 tablet by mouth daily. For HIV infection 10/19/19   Campbell Riches, MD  hydrOXYzine (ATARAX/VISTARIL) 25 MG tablet Take 1 tablet (25 mg total) by mouth every 6 (six) hours as needed for anxiety. 10/18/19   Lindell Spar I, NP  ibuprofen (ADVIL) 200 MG tablet Take 400 mg by mouth every 6 (six) hours as needed for headache or moderate pain.    [provider]  nicotine polacrilex (NICORETTE) 4 MG gum Take 1 each (4 mg total) by mouth as needed. (May buy from over the counter): For smoking cessation 10/18/19   Lindell Spar I, NP  omeprazole (PRILOSEC) 20 MG capsule Take 20 mg by mouth daily.     [provider]  sertraline (ZOLOFT) 50 MG tablet Take 1 tablet (50 mg total) by mouth daily. For depression 10/19/19   Lindell Spar I, NP  traZODone (DESYREL) 50 MG tablet Take 1 tablet (50 mg total) by mouth at bedtime as needed for sleep. 10/18/19   Encarnacion Slates, NP    Allergies Patient has no known allergies.   REVIEW OF SYSTEMS  Negative except as noted here or in the History of Present Illness.   PHYSICAL EXAMINATION  Initial Vital Signs Blood pressure (!) 166/114, pulse 82, temperature 98.5 F (36.9 C), temperature source Oral, resp. rate 18, SpO2 95 %.  Examination General: Well-developed, well-nourished male in no acute distress; appearance consistent with age of record HENT: normocephalic; atraumatic Eyes: pupils equal, round and reactive to light; extraocular muscles intact Neck: supple Heart: regular rate and rhythm Lungs: clear to auscultation bilaterally Abdomen: soft; nondistended; nontender; no masses or hepatosplenomegaly; bowel sounds present Extremities: No deformity; full range of motion; prominent veins of the volar right wrist:    Neurologic: Awake, alert and oriented; motor function intact in all extremities and symmetric; no facial droop Skin: Warm and dry Psychiatric: Normal mood and affect   RESULTS  Summary of this visit's results, reviewed and interpreted by myself:   EKG Interpretation  Date/Time:    Ventricular Rate:    PR Interval:  QRS Duration:   QT Interval:    QTC Calculation:   R Axis:     Text Interpretation:        Laboratory Studies: No results found for this or any previous visit (from the past 24 hour(s)). Imaging Studies: No results found.  ED COURSE and MDM  Nursing notes, initial and subsequent vitals signs, including pulse oximetry, reviewed and interpreted by myself.  Vitals:   10/21/19 0027  BP: (!) 166/114  Pulse: 82  Resp: 18  Temp: 98.5 F (36.9 C)  TempSrc: Oral  SpO2: 95%   Medications   enoxaparin (LOVENOX) injection 80 mg (has no administration in time range)    We will give patient a shot of Lovenox and have him return to the vascular lab at [facility] for a Doppler study.  PROCEDURES  Procedures   ED DIAGNOSES     ICD-10-CM   1. Suspected deep vein thrombosis  R09.89        Paula Libra, MD 10/21/19 0041

## 2019-10-22 ENCOUNTER — Ambulatory Visit (HOSPITAL_COMMUNITY): Admission: RE | Admit: 2019-10-22 | Payer: Self-pay | Source: Ambulatory Visit

## 2019-10-25 ENCOUNTER — Ambulatory Visit: Payer: Self-pay

## 2019-10-25 ENCOUNTER — Ambulatory Visit: Payer: Self-pay | Admitting: Pharmacist

## 2019-10-25 ENCOUNTER — Ambulatory Visit: Payer: Self-pay | Admitting: Internal Medicine

## 2019-10-30 ENCOUNTER — Ambulatory Visit (INDEPENDENT_AMBULATORY_CARE_PROVIDER_SITE_OTHER): Payer: Self-pay | Admitting: Infectious Diseases

## 2019-10-30 ENCOUNTER — Ambulatory Visit: Payer: Self-pay

## 2019-10-30 ENCOUNTER — Ambulatory Visit (INDEPENDENT_AMBULATORY_CARE_PROVIDER_SITE_OTHER): Payer: Self-pay | Admitting: Pharmacist

## 2019-10-30 ENCOUNTER — Encounter: Payer: Self-pay | Admitting: Infectious Diseases

## 2019-10-30 ENCOUNTER — Other Ambulatory Visit: Payer: Self-pay

## 2019-10-30 DIAGNOSIS — B20 Human immunodeficiency virus [HIV] disease: Secondary | ICD-10-CM

## 2019-10-30 DIAGNOSIS — F1124 Opioid dependence with opioid-induced mood disorder: Secondary | ICD-10-CM

## 2019-10-30 DIAGNOSIS — F332 Major depressive disorder, recurrent severe without psychotic features: Secondary | ICD-10-CM

## 2019-10-30 MED ORDER — BICTEGRAVIR-EMTRICITAB-TENOFOV 50-200-25 MG PO TABS: 1.0000 | ORAL_TABLET | Freq: Every day | ORAL | 5 refills | Status: DC

## 2019-10-30 NOTE — Assessment & Plan Note (Signed)
He has f/u appt with [facility].  He did not like how anti-depressant made him feel.  Appreciate their f/u.

## 2019-10-30 NOTE — Assessment & Plan Note (Signed)
Will get him in with janet.

## 2019-10-30 NOTE — Assessment & Plan Note (Signed)
Offered/refused condoms.  Will check his CD4 and HIV Rna and genotype now His vax are uptodate.  rtc in 6 weeks.

## 2019-10-30 NOTE — Progress Notes (Signed)
HPI: Jimmy Fisher is a 39 y.o. male who presents to the [facility] today to initiate care for a newly diagnosed HIV infection.  Patient Active Problem List   Diagnosis Date Noted  . HIV disease (HCC)   . Opioid dependence with opioid-induced mood disorder (HCC)   . Heroin overdose (HCC) 10/13/2019  . Major depressive disorder, 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/2019  . Cervical lymphadenopathy 07/03/2019  . Neck pain 05/17/2019    Patient's Medications  New Prescriptions   No medications on file  Previous Medications   HYDROXYZINE (ATARAX/VISTARIL) 25 MG TABLET    Take 1 tablet (25 mg total) by mouth every 6 (six) hours as needed for anxiety.   IBUPROFEN (ADVIL) 200 MG TABLET    Take 400 mg by mouth every 6 (six) hours as needed for headache or moderate pain.   NICOTINE POLACRILEX (NICORETTE) 4 MG GUM    Take 1 each (4 mg total) by mouth as needed. (May buy from over the counter): For smoking cessation   OMEPRAZOLE (PRILOSEC) 20 MG CAPSULE    Take 20 mg by mouth daily.   SERTRALINE (ZOLOFT) 50 MG TABLET    Take 1 tablet (50 mg total) by mouth daily. For depression   TRAZODONE (DESYREL) 50 MG TABLET    Take 1 tablet (50 mg total) by mouth at bedtime as needed for sleep.  Modified Medications   Modified Medication Previous Medication   BICTEGRAVIR-EMTRICITABINE-TENOFOVIR AF (BIKTARVY) 50-200-25 MG TABS TABLET bictegravir-emtricitabine-tenofovir AF (BIKTARVY) 50-200-25 MG TABS tablet      Take 1 tablet by mouth daily.    Take 1 tablet by mouth daily. For HIV infection  Discontinued Medications   No medications on file    Allergies: No Known Allergies  Past Medical History: Past Medical History:  Diagnosis Date  . DVT (deep venous thrombosis) (HCC)   . GERD (gastroesophageal reflux disease)   . Hiatal hernia   . Hypercholesteremia   . Pulmonary embolism [facility])     Social History: Social History   Socioeconomic History  . Marital status:  Married    Spouse name: Not on file  . Number of children: Not on file  . Years of education: Not on file  . Highest education level: Not on file  Occupational History  . Not on file  Tobacco Use  . Smoking status: Never Smoker  . Smokeless tobacco: Current User    Types: Snuff  Substance and Sexual Activity  . Alcohol use: No  . Drug use: No  . Sexual activity: Not on file  Other Topics Concern  . Not on file  Social History Narrative  . Not on file   Social Determinants of Health   Financial Resource Strain:   . Difficulty of Paying Living Expenses:   Food Insecurity:   . Worried About Programme researcher, broadcasting/film/video in the Last Year:   . Barista in the Last Year:   Transportation Needs:   . Freight forwarder (Medical):   Marland Kitchen Lack of Transportation (Non-Medical):   Physical Activity:   . Days of Exercise per Week:   . Minutes of Exercise per Session:   Stress:   . Feeling of Stress :   Social Connections:   . Frequency of Communication with Friends and Family:   . Frequency of Social Gatherings with Friends and Family:   . Attends Religious Services:   . Active Member of Clubs or Organizations:   . Attends  Club or Organization Meetings:   Marland Kitchen Marital Status:     Labs: Lab Results  Component Value Date   HIV1RNAQUANT 223,000 10/15/2019    RPR and STI Lab Results  Component Value Date   LABRPR NON REACTIVE 10/16/2019    No flowsheet data found.  Hepatitis B Lab Results  Component Value Date   HEPBSAG NON REACTIVE 10/15/2019   Hepatitis C No results found for: HEPCAB, HCVRNAPCRQN Hepatitis A Lab Results  Component Value Date   HAV NON REACTIVE 10/15/2019   Lipids: No results found for: CHOL, TRIG, HDL, CHOLHDL, VLDL, LDLCALC  Current HIV Regimen: Treatment naive  Assessment: Jimmy Fisher is here today to initiate care with Dr. Johnnye Sima for his newly diagnosed HIV infection.  He is treatment naive with an initial HIV viral load of 223,000 and a CD4  count of 512. He was started on Biktarvy in the hospital where he was admitted for an overdose after finding out he was HIV positive.  He states that he was notified via mychart that he was positive and became depressed and anxious afterwards. He states that he called the physician who ordered the labs and told him that he was suicidal and the physician neglected to do anything.   He is tolerating Biktarvy well with some mild aches and fatigue. He states the symptoms could be due to his anxiety as well. I explained that if it was medication related that hopefully it would go away with time. No missed dose since he started 2 weeks ago. His wife is here and interested in PrEP, so I will begin that process for her. He is quite upset about how his diagnosis was handled and is tearful today. Offered my sincerest apology. He states everyone from ID has been very helpful.  He knows to call me with any issues that come up.  Plan: - Continue Biktarvy PO once daily - F/u with Dr. Johnnye Sima in 4 weeks  Anyeli Hockenbury L. Hermila Millis, PharmD, BCIDP, [place], CPP Clinical Pharmacist Practitioner Infectious Diseases [place] [facility] 10/30/2019, 4:43 PM
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
## 2019-10-31 ENCOUNTER — Encounter: Payer: Self-pay | Admitting: Infectious Diseases

## 2019-10-31 LAB — T-HELPER CELL (CD4) - (RCID CLINIC ONLY)
CD4 % Helper T Cell: 24 % — ABNORMAL LOW (ref 33–65)
CD4 T Cell Abs: 658 /uL (ref 400–1790)

## 2019-11-05 LAB — HIV-1 RNA, PCR (GRAPH) RFX/GENO EDI
HIV-1 RNA BY PCR: 69800 copies/mL
HIV-1 RNA Quant, Log: 4.844 log10copy/mL

## 2019-11-05 LAB — HIV GENOSURE(R) MG

## 2019-11-05 LAB — REFLEX TO GENOSURE(R) MG EDI: HIV GenoSure(R): 1

## 2019-11-06 ENCOUNTER — Other Ambulatory Visit: Payer: Self-pay

## 2019-11-06 ENCOUNTER — Ambulatory Visit: Payer: Self-pay

## 2019-11-07 ENCOUNTER — Encounter: Payer: Self-pay | Admitting: Pharmacist

## 2019-11-07 ENCOUNTER — Other Ambulatory Visit: Payer: Self-pay | Admitting: Pharmacist

## 2019-11-07 DIAGNOSIS — B20 Human immunodeficiency virus [HIV] disease: Secondary | ICD-10-CM

## 2019-11-07 LAB — HIV-1 RNA ULTRAQUANT REFLEX TO GENTYP+
HIV 1 RNA Quant: 604 copies/mL — ABNORMAL HIGH
HIV-1 RNA Quant, Log: 2.78 Log copies/mL — ABNORMAL HIGH

## 2019-11-07 LAB — HIV-1 GENOTYPE: HIV-1 Genotype: DETECTED — AB

## 2019-11-07 NOTE — Progress Notes (Signed)
Patient is requesting another viral load. He will come in on 6/8.

## 2019-11-13 ENCOUNTER — Ambulatory Visit: Payer: Self-pay

## 2019-11-13 ENCOUNTER — Other Ambulatory Visit: Payer: Self-pay

## 2019-11-13 DIAGNOSIS — B20 Human immunodeficiency virus [HIV] disease: Secondary | ICD-10-CM

## 2019-11-13 DIAGNOSIS — F331 Major depressive disorder, recurrent, moderate: Secondary | ICD-10-CM

## 2019-11-13 MED FILL — BIKTARVY 50-200-25 MG TABS: 50-200-25 | 30 days supply | Qty: 30 | Fill #0

## 2019-11-13 NOTE — Progress Notes (Unsigned)
Mental Health Therapist Progress Note   Name: Jimmy Fisher  Total time: 30  Type of Service: Individual Outpatient Mental Health Therapy  OBJECTIVE:  Mood: Euthymic and Affect: Appropriate Risk of harm to self or others: No plan to harm self or others  DIAGNOSIS:   GOALS ADDRESSED:  Patient will: 1.  Reduce symptoms of: depression  2.  Increase knowledge and/or ability of: coping skills  3.  Demonstrate ability to: Increase healthy adjustment to current life circumstances  INTERVENTIONS: Interventions utilized:  Supportive Counseling Therapist met with patient for outpatient mental health individual therapy to include ongoing assessment, support, and reinforcement.  Therapist allowed patient to "check in" since previous session; asking patient to share any positive coping skills they may have used over the previous week, along with any challenges faced.  Therapist provided supportive listening as patient processed their thoughts, emotional responses, and behaviors surrounding several stressors.Therapist and client processed their thoughts, emotional responses, and behaviors surrounding several stressors, particularly related to his relationship with his wife and his recent return to church after his return to drug use. Patient shared that he does not believe that he deserves self-forgiveness for the various things he has done over the last 10 or more years of his life. When therapist ask patient to share his definition of "forgiveness," client explained that it meant "not condoning someone else's behaviors but realizing that they are human and make mistakes", while allowing himself freedom from the pain or hurt by those actions.  When asked if he believed that he was "deserving" of that same level of forgiveness, he responded that he does not believe so.  Patient shared that throughout his marriage, he has been unfaithful and that if his wife were to discover this, she would leave him.  Patient shared that he would like to try to keep his marriage intact and spoke positively about this.  EFFECTIVENESS/PLAN: Client was alert, oriented x3, with no SI, HI, or symptoms of psychosis (risk low).  Client was pleasant and friendly, engaging openly and appropriately with therapist, benefiting from supportive listening and exploration of feelings.  Next session recommended in one to two weeks.   Hughes Better, LCSW

## 2019-11-14 ENCOUNTER — Encounter: Payer: Self-pay | Admitting: Infectious Diseases

## 2019-11-15 LAB — HIV-1 RNA QUANT-NO REFLEX-BLD
HIV 1 RNA Quant: 180 copies/mL — ABNORMAL HIGH
HIV-1 RNA Quant, Log: 2.26 Log copies/mL — ABNORMAL HIGH

## 2019-11-20 ENCOUNTER — Ambulatory Visit: Payer: Self-pay

## 2019-11-20 ENCOUNTER — Other Ambulatory Visit: Payer: Self-pay

## 2019-11-20 ENCOUNTER — Telehealth: Payer: Self-pay | Admitting: Pharmacy Technician

## 2019-11-20 NOTE — Telephone Encounter (Signed)
[facility] Patient Advocate Encounter   Patient has been withdrawn from [facility] Advancing Access Patient Assistance Program effective 11/18/2019 due to his now active adap status. Patient is aware he will use [facility] [address] for his medication refills.   Patient knows to call the office with questions or concerns.  [name], CPhT Specialty Pharmacy Patient [facility] for Infectious Disease Phone: [phone] Fax: [phone] 11/20/2019 11:09 AM

## 2019-11-28 ENCOUNTER — Ambulatory Visit: Payer: Self-pay

## 2019-11-29 ENCOUNTER — Ambulatory Visit: Payer: Self-pay

## 2019-12-04 ENCOUNTER — Ambulatory Visit: Payer: Self-pay | Admitting: Infectious Diseases

## 2019-12-06 ENCOUNTER — Other Ambulatory Visit: Payer: Self-pay | Admitting: Pharmacist

## 2019-12-06 ENCOUNTER — Telehealth: Payer: Self-pay | Admitting: Pharmacy Technician

## 2019-12-06 DIAGNOSIS — B20 Human immunodeficiency virus [HIV] disease: Secondary | ICD-10-CM

## 2019-12-06 MED ORDER — BICTEGRAVIR-EMTRICITAB-TENOFOV 50-200-25 MG PO TABS: 1.0000 | ORAL_TABLET | Freq: Every day | ORAL | 5 refills | Status: DC

## 2019-12-06 NOTE — Progress Notes (Signed)
Patient is approved for HMAP. Sending Biktarvy to [facility] on [address]. Betty will call patient and coordinate.   

## 2019-12-06 NOTE — Telephone Encounter (Signed)
[facility] Patient Advocate Encounter  Spoke with the patient today.  Let him know that he will be using [facility] on [address] going forward since he is ADAP approved.    Netty Starring. Dimas Aguas CPhT Specialty Pharmacy Patient [facility] for Infectious Disease Phone: [phone] Fax:  [phone]

## 2019-12-18 ENCOUNTER — Ambulatory Visit (INDEPENDENT_AMBULATORY_CARE_PROVIDER_SITE_OTHER): Payer: Self-pay | Admitting: Infectious Diseases

## 2019-12-18 ENCOUNTER — Encounter: Payer: Self-pay | Admitting: Infectious Diseases

## 2019-12-18 ENCOUNTER — Other Ambulatory Visit: Payer: Self-pay

## 2019-12-18 VITALS — BP 136/98 | HR 92 | Wt 188.8 lb

## 2019-12-18 DIAGNOSIS — Z79899 Other long term (current) drug therapy: Secondary | ICD-10-CM

## 2019-12-18 DIAGNOSIS — Z113 Encounter for screening for infections with a predominantly sexual mode of transmission: Secondary | ICD-10-CM

## 2019-12-18 DIAGNOSIS — F1124 Opioid dependence with opioid-induced mood disorder: Secondary | ICD-10-CM

## 2019-12-18 DIAGNOSIS — F332 Major depressive disorder, recurrent severe without psychotic features: Secondary | ICD-10-CM

## 2019-12-18 DIAGNOSIS — B20 Human immunodeficiency virus [HIV] disease: Secondary | ICD-10-CM

## 2019-12-18 DIAGNOSIS — K219 Gastro-esophageal reflux disease without esophagitis: Secondary | ICD-10-CM

## 2019-12-18 MED ORDER — OMEPRAZOLE 20 MG PO CPDR: 20.0000 mg | DELAYED_RELEASE_CAPSULE | Freq: Every day | ORAL | 3 refills | Status: DC

## 2019-12-18 MED ORDER — FLUOXETINE HCL 20 MG PO CAPS: ORAL_CAPSULE | ORAL | 3 refills | Status: DC

## 2019-12-18 NOTE — Assessment & Plan Note (Signed)
Doing well Will check his VL today Given condoms Does not want COVID vax Wife on prep rtc in 6 months.

## 2019-12-18 NOTE — Assessment & Plan Note (Signed)
Will refill his prozac today.  Has been doing better.  Can't take neurontin as it makes him too sleepy.

## 2019-12-18 NOTE — Progress Notes (Signed)
   Subjective:    Patient ID: Jimmy Fisher, male    DOB: 1981/04/03, 39 y.o.   MRN: [id number]  HPI 39 y.o.maledx with HIV+ roughly 1 week prior to adm to ED 5-2 after heroin o/d and apneic/narcan episode.  He was adm to [facility] with depression. He was started on biktarvy by ID on call. CD4 512.   Sates he has long hx of IBS/fibromyalgia. Last colon was 2021 [facility]).   Has been feeling well. No problems with biktarvy.  Wife is doing well.  Has been staying clean until he had cocaine relapse. Went to inpatient rehab and got 2 weeks inpt.    HIV 1 RNA Quant (copies/mL)  Date Value  11/13/2019 180 (H)  10/30/2019 604 (H)  10/15/2019 223,000   CD4 T Cell Abs (/uL)  Date Value  10/30/2019 658    Review of Systems  Constitutional: Negative for appetite change and unexpected weight change.  Respiratory: Negative for cough and shortness of breath.   Gastrointestinal: Negative for constipation and diarrhea.  Genitourinary: Negative for difficulty urinating.       Objective:   Physical Exam Constitutional:      General: He is not in acute distress.    Appearance: He is not ill-appearing.  HENT:     Mouth/Throat:     Mouth: Mucous membranes are moist.     Pharynx: No oropharyngeal exudate.  Eyes:     Extraocular Movements: Extraocular movements intact.     Pupils: Pupils are equal, round, and reactive to light.  Cardiovascular:     Rate and Rhythm: Normal rate and regular rhythm.  Pulmonary:     Effort: Pulmonary effort is normal.     Breath sounds: Normal breath sounds.  Abdominal:     General: Bowel sounds are normal. There is no distension.     Palpations: Abdomen is soft.     Tenderness: There is no abdominal tenderness.  Musculoskeletal:        General: Normal range of motion.     Cervical back: Normal range of motion and neck supple.     Right lower leg: No edema.     Left lower leg: No edema.  Neurological:     General: No focal deficit  present.     Mental Status: He is alert.  Psychiatric:        Mood and Affect: Mood normal.           Assessment & Plan:

## 2019-12-18 NOTE — Assessment & Plan Note (Signed)
Doing much better.  Has sponsor.

## 2019-12-18 NOTE — Assessment & Plan Note (Signed)
Omeprazole refilled.

## 2019-12-21 LAB — HIV-1 RNA ULTRAQUANT REFLEX TO GENTYP+
HIV 1 RNA Quant: 30 copies/mL — ABNORMAL HIGH
HIV-1 RNA Quant, Log: 1.48 Log copies/mL — ABNORMAL HIGH

## 2019-12-24 ENCOUNTER — Telehealth: Payer: Self-pay

## 2019-12-24 NOTE — Telephone Encounter (Signed)
Patient called office today requesting refill on Trazadone. Medication has not been filled by office before. Does not have a PCP yet, but will start locating PCP.  Would like to know if MD would be able to send in one month supply until he establishes care with PCP.  Lorenso Courier, [place]

## 2019-12-25 ENCOUNTER — Other Ambulatory Visit: Payer: Self-pay

## 2019-12-25 MED ORDER — TRAZODONE HCL 50 MG PO TABS: 50.0000 mg | ORAL_TABLET | Freq: Every evening | ORAL | 0 refills | Status: DC | PRN

## 2019-12-25 NOTE — Telephone Encounter (Signed)
Ok to fill til he has PCP thanks

## 2020-01-10 ENCOUNTER — Other Ambulatory Visit: Payer: Self-pay

## 2020-01-10 ENCOUNTER — Encounter: Payer: Self-pay | Admitting: Internal Medicine

## 2020-01-10 ENCOUNTER — Ambulatory Visit: Payer: Self-pay | Admitting: Internal Medicine

## 2020-01-10 VITALS — BP 135/90 | HR 88 | Temp 98.2°F | Ht 66.0 in | Wt 190.3 lb

## 2020-01-10 DIAGNOSIS — F332 Major depressive disorder, recurrent severe without psychotic features: Secondary | ICD-10-CM

## 2020-01-10 DIAGNOSIS — F1124 Opioid dependence with opioid-induced mood disorder: Secondary | ICD-10-CM

## 2020-01-10 DIAGNOSIS — R109 Unspecified abdominal pain: Secondary | ICD-10-CM

## 2020-01-10 DIAGNOSIS — E785 Hyperlipidemia, unspecified: Secondary | ICD-10-CM | POA: Insufficient documentation

## 2020-01-10 DIAGNOSIS — E782 Mixed hyperlipidemia: Secondary | ICD-10-CM

## 2020-01-10 DIAGNOSIS — Z0000 Encounter for general adult medical examination without abnormal findings: Secondary | ICD-10-CM

## 2020-01-10 DIAGNOSIS — M797 Fibromyalgia: Secondary | ICD-10-CM

## 2020-01-10 LAB — POCT URINALYSIS DIPSTICK
Bilirubin, UA: NEGATIVE
Blood, UA: NEGATIVE
Glucose, UA: NEGATIVE
Leukocytes, UA: NEGATIVE
Nitrite, UA: NEGATIVE
Protein, UA: NEGATIVE
Spec Grav, UA: >=1.03 — AB (ref 1.010–1.025)
Urobilinogen, UA: 0.2 E.U./dL
pH, UA: 5 (ref 5.0–8.0)

## 2020-01-10 MED ORDER — DULOXETINE HCL 30 MG PO CPEP: 30.0000 mg | ORAL_CAPSULE | Freq: Every day | ORAL | 2 refills | Status: DC

## 2020-01-10 MED ORDER — TAMSULOSIN HCL 0.4 MG PO CAPS: 0.4000 mg | ORAL_CAPSULE | Freq: Every day | ORAL | 0 refills | Status: DC

## 2020-01-10 NOTE — Assessment & Plan Note (Signed)
Noted on chart review to have history of opioid use disorder. Had 8 years of sobriety until he relapsed during the pandemic. Uses via IV route with heroin with accidental overdose requiring admission on 10/2019 and has been sober since. Currently denies any illicit substance use. Follows with [facility] for counseling with ID clinic.

## 2020-01-10 NOTE — Progress Notes (Signed)
CC: Flank pain  HPI: Jimmy Fisher is a 39 y.o. with PMH listed below presenting with complaint of flank pain. Please see problem based assessment and plan for further details.  Past Medical History:  Diagnosis Date  . DVT (deep venous thrombosis) (HCC)   . GERD (gastroesophageal reflux disease)   . Hiatal hernia   . Hypercholesteremia   . Pulmonary embolism (HCC)    No past surgical history on file.  Family History Biological father had cancer but Jimmy Fisher does not know what type.  Past Social History Currently unemployed. Lives with wife and 2 sons. Denies any alcohol or current drug use. Uses e-cigarettes.  Review of Systems: Review of Systems  Constitutional: Negative for chills, fever and malaise/fatigue.  Eyes: Negative for blurred vision.  Respiratory: Negative for shortness of breath.   Cardiovascular: Negative for chest pain and leg swelling.  Gastrointestinal: Negative for constipation, diarrhea, nausea and vomiting.  Genitourinary: Positive for flank pain. Negative for dysuria and hematuria.  Musculoskeletal: Positive for back pain, myalgias and neck pain. Negative for joint pain.  All other systems reviewed and are negative.   Physical Exam: Vitals:   01/10/20 1420  BP: (!) 135/90  Pulse: 88  Temp: 98.2 F (36.8 C)  TempSrc: Oral  SpO2: 98%  Weight: 190 lb 4.8 oz (86.3 kg)  Height: 5\' 6"  (1.676 m)   Gen: Well-developed, well nourished, NAD HEENT: NCAT head, hearing intact CV: RRR, S1, S2 normal Pulm: CTAB, No rales, no wheezes Abd: BS+, Non-distended, Diffuse tenderness to palpation, R CVA tenderness Extm: ROM intact, Peripheral pulses intact, No peripheral edema Skin: Dry, Warm, normal turgor  Assessment & Plan:   Opioid dependence with opioid-induced mood disorder (HCC) Noted on chart review to have history of opioid use disorder. Had 8 years of sobriety until he relapsed during the pandemic. Uses via IV route with heroin with  accidental overdose requiring admission on 10/2019 and has been sober since. Currently denies any illicit substance use. Follows with 11/2019 for counseling with ID clinic.  Flank pain Jimmy Fisher is a 39 yo M w/ PMH of HIV, MDD presenting to [facility] to establish care. He mentions that he was referred via ID clinic to establish care with PCP for his chronic disease but he also has an acute issue that needs to be addressed. He mentions being in his usual state of health until last Thursday when he had acute onset R flank pain. He mentions history of recurrent nephrolithiasis with similar presentation. He mentions the pain being 10/10 on Thursday associated with nausea and vomiting. Severity of the pain has decreased since then but he continues to endorse intermittent flank pain. He states that he has not passed any stones yet.  A/P Present w/ acute R flank pain. Urine dip-stick w/o obvious findings. Described similar presentation to prior nephrolithiasis and chart review confirms prior history. Need imaging to assess size of his nephrolith to determine need for more aggressive intervention but patient express significant financial concerns due to being un-insured which will make getting outpatient imaging difficult. Reasonable to treat empirically in hopes that he will pass the stone.  - Start tamsulosin 0.4mg  daily - Advised on frequent hydration - Ibuprofen prn for pain - Strainer provided for stone capture and analysis  Healthcare maintenance Discussed need to get COVID vaccine. Mentions that he has been an anti-vaxxer all his life and refused to get the COVID vaccine.  Major depressive disorder, recurrent severe without psychotic features (HCC) Presents for  f/u management of his MDD. Mentions poor sleep and fatigue and lack of energy or motivation. PHQ-9 score of 12 at this visit. On fluoxetine 20mg  daily. Denies any current suicidal/homicidal ideations.  - Will hold off on increasing fluoxetine  dose at the moment in setting of adding additional SNRI therapy - C/w fluoxetine 20mg  daily - Start duloxetine 30mg  daily - C/w w/ counseling  Hyperlipidemia Lipid Panel  No results found for: CHOL, TRIG, HDL, CHOLHDL, VLDL, LDLCALC, LDLDIRECT, LABVLDL  Mentions being told that he has high cholesterol 10 years ago. Denies any significant family history of heart attacks or strokes. Never been on cholesterol meds. Chart review shows no prior lipid profile. Noted to have lipid panel ordered by ID clinic. Jimmy Fisher requests getting his labs drawn at ID clinic to avoid unforseen medical bills.  - F/u lipid panel  Fibromyalgia Jimmy Fisher states he has prior hx of fibromyalgia. He mentions having worsening exacerbations of his chronic pain after being diagnosed with HIV. Currently having pain in bilateral arm, hands and upper back. Mentions having difficulty with sleep due to the pain as well. Taking tylenol and ibuprofen with some relief. States gabapentin has made him feel too sleepy and he d/ced it himself in the past  - Start duloxetine 30mg   - C/w fluoxetine, ibuprofen, tylenol - Can consider adding muscle relaxer if duloxetine not effective   Patient discussed with Dr.  - , PGY3 [facility] Health Internal Medicine Pager: [phone]

## 2020-01-10 NOTE — Assessment & Plan Note (Signed)
Jimmy Fisher is a 39 yo M w/ PMH of HIV, MDD presenting to [facility] to establish care. He mentions that he was referred via ID clinic to establish care with PCP for his chronic disease but he also has an acute issue that needs to be addressed. He mentions being in his usual state of health until last Thursday when he had acute onset R flank pain. He mentions history of recurrent nephrolithiasis with similar presentation. He mentions the pain being 10/10 on Thursday associated with nausea and vomiting. Severity of the pain has decreased since then but he continues to endorse intermittent flank pain. He states that he has not passed any stones yet.  A/P Present w/ acute R flank pain. Urine dip-stick w/o obvious findings. Described similar presentation to prior nephrolithiasis and chart review confirms prior history. Need imaging to assess size of his nephrolith to determine need for more aggressive intervention but patient express significant financial concerns due to being un-insured which will make getting outpatient imaging difficult. Reasonable to treat empirically in hopes that he will pass the stone.  - Start tamsulosin 0.4mg  daily - Advised on frequent hydration - Ibuprofen prn for pain - Strainer provided for stone capture and analysis

## 2020-01-10 NOTE — Assessment & Plan Note (Addendum)
Presents for f/u management of his MDD. Mentions poor sleep and fatigue and lack of energy or motivation. PHQ-9 score of 12 at this visit. On fluoxetine 20mg  daily. Denies any current suicidal/homicidal ideations.  - Will hold off on increasing fluoxetine dose at the moment in setting of adding additional SNRI therapy - C/w fluoxetine 20mg  daily - Start duloxetine 30mg  daily - C/w w/ counseling

## 2020-01-10 NOTE — Assessment & Plan Note (Signed)
Jimmy Fisher states he has prior hx of fibromyalgia. He mentions having worsening exacerbations of his chronic pain after being diagnosed with HIV. Currently having pain in bilateral arm, hands and upper back. Mentions having difficulty with sleep due to the pain as well. Taking tylenol and ibuprofen with some relief. States gabapentin has made him feel too sleepy and he d/ced it himself in the past  - Start duloxetine 30mg   - C/w fluoxetine, ibuprofen, tylenol - Can consider adding muscle relaxer if duloxetine not effective

## 2020-01-10 NOTE — Assessment & Plan Note (Signed)
Lipid Panel  No results found for: CHOL, TRIG, HDL, CHOLHDL, VLDL, LDLCALC, LDLDIRECT, LABVLDL  Mentions being told that he has high cholesterol 10 years ago. Denies any significant family history of heart attacks or strokes. Never been on cholesterol meds. Chart review shows no prior lipid profile. Noted to have lipid panel ordered by ID clinic. Mr.Cellucci requests getting his labs drawn at ID clinic to avoid unforseen medical bills.  - F/u lipid panel

## 2020-01-10 NOTE — Assessment & Plan Note (Addendum)
Discussed need to get COVID vaccine. Mentions that he has been an anti-vaxxer all his life and refused to get the COVID vaccine.

## 2020-01-10 NOTE — Patient Instructions (Addendum)
Thank you for allowing [place] to provide your care today. Today we discussed your flank pain    I have ordered no labs for you. I will call if any are abnormal.    Today we made the following changes to your medications.    Please take tamsulosin 0.4mg  daily Take ibuprofen as needed for pain Start duloxetine 30mg  daily  Please follow-up in 6 weeks.    Should you have any questions or concerns please call the internal medicine clinic at [phone].     Flank Pain, Adult Flank pain is pain in your side. The flank is the area of your side between your upper belly (abdomen) and your back. The pain may occur over a short time (acute), or it may be long-term or come back often (chronic). It may be mild or very bad. Pain in this area can be caused by many different things. Follow these instructions at home:   Drink enough fluid to keep your pee (urine) clear or pale yellow.  Rest as told by your doctor.  Take over-the-counter and prescription medicines only as told by your doctor.  Keep a journal to keep track of: ? What has caused your flank pain. ? What has made it feel better.  Keep all follow-up visits as told by your doctor. This is important. Contact a doctor if:  Medicine does not help your pain.  You have new symptoms.  Your pain gets worse.  You have a fever.  Your symptoms last longer than 2-3 days.  You have trouble peeing.  You are peeing more often than normal. Get help right away if:  You have trouble breathing.  You are short of breath.  Your belly hurts, or it is swollen or red.  You feel sick to your stomach (nauseous).  You throw up (vomit).  You feel like you will pass out, or you do pass out (faint).  You have blood in your pee. Summary  Flank pain is pain in your side. The flank is the area of your side between your upper belly (abdomen) and your back.  Flank pain may occur over a short time (acute), or it may be long-term or come back  often (chronic). It may be mild or very bad.  Pain in this area can be caused by many different things.  Contact your doctor if your symptoms get worse or they last longer than 2-3 days. This information is not intended to replace advice given to you by your health care provider. Make sure you discuss any questions you have with your health care provider. Document Revised: 05/13/2017 Document Reviewed: 09/20/2016 Elsevier Patient Education  2020 11/20/2016.

## 2020-01-16 ENCOUNTER — Encounter: Payer: Self-pay | Admitting: Infectious Diseases

## 2020-01-16 NOTE — Progress Notes (Signed)
Internal Medicine Clinic Attending  Case discussed with Dr. Lee  At the time of the visit.  We reviewed the resident's history and exam and pertinent patient test results.  I agree with the assessment, diagnosis, and plan of care documented in the resident's note.    

## 2020-01-28 ENCOUNTER — Telehealth: Payer: Self-pay

## 2020-01-28 NOTE — Telephone Encounter (Signed)
Pt states he is not tolerating duloxetine well, appt 8/18. He is also asking about what he will need to meet with financial couns. Will send to nenika, to rtc. He is also instructed since he is living w/ mother and she is fully supporting him that he will need a statement from her. He is agreeable, sending to [employer]

## 2020-01-28 NOTE — Telephone Encounter (Signed)
Requesting to speak with a nurse about side effects on med, please call pt back.

## 2020-01-30 ENCOUNTER — Other Ambulatory Visit: Payer: Self-pay

## 2020-01-30 ENCOUNTER — Ambulatory Visit: Payer: Self-pay

## 2020-01-30 ENCOUNTER — Ambulatory Visit (INDEPENDENT_AMBULATORY_CARE_PROVIDER_SITE_OTHER): Payer: Self-pay | Admitting: Internal Medicine

## 2020-01-30 VITALS — BP 129/92 | HR 97 | Temp 98.2°F | Wt 191.7 lb

## 2020-01-30 DIAGNOSIS — M797 Fibromyalgia: Secondary | ICD-10-CM

## 2020-01-30 DIAGNOSIS — R61 Generalized hyperhidrosis: Secondary | ICD-10-CM

## 2020-01-30 DIAGNOSIS — F411 Generalized anxiety disorder: Secondary | ICD-10-CM | POA: Insufficient documentation

## 2020-01-30 DIAGNOSIS — F332 Major depressive disorder, recurrent severe without psychotic features: Secondary | ICD-10-CM

## 2020-01-30 MED ORDER — BUSPIRONE HCL 5 MG PO TABS: 5.0000 mg | ORAL_TABLET | Freq: Two times a day (BID) | ORAL | 2 refills | Status: DC

## 2020-01-30 MED ORDER — FLUOXETINE HCL 40 MG PO CAPS: 40.0000 mg | ORAL_CAPSULE | Freq: Every day | ORAL | 2 refills | Status: DC

## 2020-01-30 NOTE — Patient Instructions (Signed)
It was nice seeing you today! Thank you for choosing [facility] for your Primary Care.    Today we talked about:   1. Medication Changes:  a. Increase Prozac to 40 mg daily. If you still have pills left in your current bottle, increase to 2 capsules per day. When you pickup the new prescription at the pharmacy, go back to 1 capsule per day.  b. Start Buspar, taken twice daily.  c. Stop Gabapentin   Lets follow up in 2 weeks, either in person or tele-health!

## 2020-01-30 NOTE — Telephone Encounter (Signed)
I have mailed patient a package   Thanks  [employer]

## 2020-01-30 NOTE — Progress Notes (Signed)
   CC: Medication adverse effect  HPI:  [name] is a 39 y.o. with a PMHx as listed below who presents to the clinic for medication adverse effect.   Please see the Encounters tab for problem-based Assessment & Plan regarding status of patient's acute and chronic conditions.  Past Medical History:  Diagnosis Date  . DVT (deep venous thrombosis) (HCC)   . GERD (gastroesophageal reflux disease)   . Hiatal hernia   . Hypercholesteremia   . Pulmonary embolism (HCC)    Review of Systems: Review of Systems  Constitutional: Positive for diaphoresis.  Cardiovascular: Negative for palpitations.  Neurological: Negative for dizziness, focal weakness, weakness and headaches.  Psychiatric/Behavioral: Positive for depression. Negative for memory loss, substance abuse and suicidal ideas. The patient is nervous/anxious and has insomnia.    Physical Exam:  Vitals:   01/30/20 1509  BP: (!) 129/92  Pulse: 97  Temp: 98.2 F (36.8 C)  TempSrc: Oral  SpO2: 97%  Weight: 191 lb 11.2 oz (87 kg)    Physical Exam Vitals and nursing note reviewed.  Constitutional:      General: He is not in acute distress.    Appearance: He is normal weight.  Neurological:     General: No focal deficit present.     Mental Status: He is alert and oriented to person, place, and time. Mental status is at baseline.  Psychiatric:        Attention and Perception: Attention normal.        Mood and Affect: Affect normal. Mood is anxious.        Speech: Speech normal.        Behavior: Behavior normal. Behavior is cooperative.        Thought Content: Thought content does not include suicidal ideation. Thought content does not include suicidal plan.        Cognition and Memory: Cognition and memory normal.    Assessment & Plan:   See Encounters Tab for problem based charting.  Patient discussed with Dr. Oswaldo Done

## 2020-01-31 NOTE — Assessment & Plan Note (Addendum)
Office Visit from 01/30/2020 in [facility]  PHQ-9 Total Score 16     Mr. Culley states his depression has not improved significantly since beginning Prozac. He notes that most days he is feeling down or depressed. He is struggling with sleep, energy. However, he feels improved compared to a few months ago. He denies SI or plan. Mr. Dicostanzo states "he has chosen life at this time." He is interested in counseling. He states he had a few sessions in the past with [facility] and did not have a good experience. He is open to trying another center.   He has stopped taking Duloxetine at this time after developing severe sweating approximately 2 weeks after starting. Sweating as improved since cessation.   Assessment/Plan:  - Discontinue Duloxetine  - Increase Prozac to 40 mg  - Referral to [facility] - Follow up in 2 weeks; Prozac dose may require continued titration at that time

## 2020-01-31 NOTE — Assessment & Plan Note (Addendum)
Jimmy Fisher states his anxiety has become very uncontrolled in the past few months due to several difficulties, including new HIV diagnosis, struggle with substance abuse and martial problems. He notes he cannot fall asleep at night due to racing thoughts that he has difficulty controlling. He has not noticed any improvement since starting Prozac.   Assessment/Plan:  GAD7: 21  - Start Buspar 5 mg BID  - 2 week follow up

## 2020-01-31 NOTE — Assessment & Plan Note (Addendum)
Jimmy Fisher notes he has not had significant improvement with the addition of Gabapentin. He has experienced drowsiness that has led him to only tolerating 1 dose per day.   Assessment/Plan:  I suspect fibromyalgia has been exacerbated by anxiety and depression. We are actively treating both at this time. Will re-evaluate fibromyalgia response at next visit. Given no improvement with gabapentin, no benefit to continuance.   - Discontinue Gabapentin

## 2020-02-01 NOTE — Progress Notes (Signed)
Internal Medicine Clinic Attending  Case discussed with Dr. Basaraba  At the time of the visit.  We reviewed the resident's history and exam and pertinent patient test results.  I agree with the assessment, diagnosis, and plan of care documented in the resident's note.  

## 2020-02-12 ENCOUNTER — Ambulatory Visit (INDEPENDENT_AMBULATORY_CARE_PROVIDER_SITE_OTHER): Payer: Self-pay | Admitting: Internal Medicine

## 2020-02-12 ENCOUNTER — Other Ambulatory Visit: Payer: Self-pay

## 2020-02-12 DIAGNOSIS — M797 Fibromyalgia: Secondary | ICD-10-CM

## 2020-02-12 DIAGNOSIS — F411 Generalized anxiety disorder: Secondary | ICD-10-CM

## 2020-02-12 DIAGNOSIS — F332 Major depressive disorder, recurrent severe without psychotic features: Secondary | ICD-10-CM

## 2020-02-12 MED ORDER — BUSPIRONE HCL 10 MG PO TABS: 10.0000 mg | ORAL_TABLET | Freq: Two times a day (BID) | ORAL | 0 refills | Status: DC

## 2020-02-12 NOTE — Assessment & Plan Note (Signed)
Mr. Vanvranken states his fibromyalgia has not improved at all since the changes in his medication at his last visit.  He continues to have significant fibromyalgia that makes it difficult some days to even grasp things.  Assessment/plan: Discussed with patient that my hopes are as we treat his anxiety and depression, his fibromyalgia will improve as well.  At this time we are adjusting his BuSpar, so we will hold off on adding additional medication management for his fibromyalgia.  In the future, consider the addition of a tricyclic antidepressant if indicated.  -2-week follow-up

## 2020-02-12 NOTE — Progress Notes (Signed)
  [facility] Internal Medicine Residency Telephone Encounter Continuity Care Appointment  HPI:   This telephone encounter was created for Mr. Jimmy Fisher on 02/12/2020 for the following purpose/cc anxiety, depression, fibromyalgia follow up.   Past Medical History:  Past Medical History:  Diagnosis Date  . DVT (deep venous thrombosis) (HCC)   . GERD (gastroesophageal reflux disease)   . Hiatal hernia   . Hypercholesteremia   . Pulmonary embolism (HCC)       ROS:   Negative except as noted above.    Assessment / Plan / Recommendations:   Please see A&P under problem oriented charting for assessment of the patient's acute and chronic medical conditions.   As always, pt is advised that if symptoms worsen or new symptoms arise, they should go to an urgent care facility or to to ER for further evaluation.   Consent and Medical Decision Making:   Patient discussed with Dr. Oswaldo Done  This is a telephone encounter between Jimmy Fisher and Verdene Lennert on 02/12/2020 for anxiety/depression/fibromyalgia. The visit was conducted with the patient located at home and Verdene Lennert at [employer]. The patient's identity was confirmed using their DOB and current address. The patient has consented to being evaluated through a telephone encounter and understands the associated risks (an examination cannot be done and the patient may need to come in for an appointment) / benefits (allows the patient to remain at home, decreasing exposure to coronavirus). I personally spent 15 minutes on medical discussion.

## 2020-02-12 NOTE — Assessment & Plan Note (Signed)
Patient denies any significant improvements since Prozac dose was increased.  He denies any worsening though, denies SI.  Assessment/plan: We are adjusting patient's anxiety medication today, so we will continue with current dose of Prozac.  Consider titration at next visit.  -Prozac 40 mg daily

## 2020-02-12 NOTE — Assessment & Plan Note (Signed)
Jimmy Fisher states that his anxiety may have improved a small amount with addition of BuSpar.  He endorses continued racing thoughts though.  Assessment/plan: We will begin titration of BuSpar given he was at a low starting dose.  -Increase BuSpar to 10 mg twice daily -2-week follow-up

## 2020-02-13 NOTE — Progress Notes (Signed)
Internal Medicine Clinic Attending  Case discussed with Dr. Basaraba  At the time of the visit.  We reviewed the resident's history and pertinent patient test results.  I agree with the assessment, diagnosis, and plan of care documented in the resident's note.  

## 2020-02-19 ENCOUNTER — Encounter: Payer: Self-pay | Admitting: Licensed Clinical Social Worker

## 2020-02-25 ENCOUNTER — Encounter: Payer: Self-pay | Admitting: Student

## 2020-02-26 ENCOUNTER — Encounter: Payer: Self-pay | Admitting: Licensed Clinical Social Worker

## 2020-03-03 ENCOUNTER — Telehealth: Payer: Self-pay | Admitting: Student

## 2020-03-03 NOTE — Telephone Encounter (Signed)
Returned call to patient. States he has been having headaches, fatigue, and elevated BP (155/105) since increasing Prozac from 20 mg to 40 mg. Has appt already scheduled for 03/05/2020 with PCP. Had been telehealth but patient will come in-person to discuss. Kinnie Feil, BSN, RN-BC

## 2020-03-03 NOTE — Telephone Encounter (Signed)
Thanks Lauren! I will see him on Wednesday.

## 2020-03-03 NOTE — Telephone Encounter (Signed)
Pls contact pt regarding side effects [phone]

## 2020-03-05 ENCOUNTER — Ambulatory Visit (INDEPENDENT_AMBULATORY_CARE_PROVIDER_SITE_OTHER): Payer: Self-pay | Admitting: Student

## 2020-03-05 ENCOUNTER — Other Ambulatory Visit: Payer: Self-pay

## 2020-03-05 ENCOUNTER — Encounter: Payer: Self-pay | Admitting: Student

## 2020-03-05 VITALS — BP 142/92 | HR 72 | Temp 98.0°F | Ht 66.0 in | Wt 193.2 lb

## 2020-03-05 DIAGNOSIS — F332 Major depressive disorder, recurrent severe without psychotic features: Secondary | ICD-10-CM

## 2020-03-05 DIAGNOSIS — G4700 Insomnia, unspecified: Secondary | ICD-10-CM | POA: Insufficient documentation

## 2020-03-05 DIAGNOSIS — F5105 Insomnia due to other mental disorder: Secondary | ICD-10-CM

## 2020-03-05 DIAGNOSIS — F411 Generalized anxiety disorder: Secondary | ICD-10-CM

## 2020-03-05 DIAGNOSIS — Z0000 Encounter for general adult medical examination without abnormal findings: Secondary | ICD-10-CM

## 2020-03-05 DIAGNOSIS — F99 Mental disorder, not otherwise specified: Secondary | ICD-10-CM

## 2020-03-05 DIAGNOSIS — M797 Fibromyalgia: Secondary | ICD-10-CM

## 2020-03-05 DIAGNOSIS — Z0001 Encounter for general adult medical examination with abnormal findings: Secondary | ICD-10-CM

## 2020-03-05 MED ORDER — PREGABALIN 75 MG PO CAPS: 75.0000 mg | ORAL_CAPSULE | Freq: Two times a day (BID) | ORAL | 0 refills | Status: DC

## 2020-03-05 MED ORDER — FLUOXETINE HCL 20 MG PO CAPS: 20.0000 mg | ORAL_CAPSULE | Freq: Every day | ORAL | 0 refills | Status: DC

## 2020-03-05 NOTE — Assessment & Plan Note (Signed)
Patient says his anxiety started after his HIV diagnosis over a year ago. Mentions the Buspar has helped some, but often forgets to take his evening dose. He says he believes the Buspar has given him vivid dreams to which he acts out while he is asleep. Mentions one instance where he was shouting about being in a gun fight.   A/P: -Discussed with patient he can trial being off night time Buspar for the dreams, although not likely the culprit. Can discuss further options at his next appointment in a month.

## 2020-03-05 NOTE — Assessment & Plan Note (Signed)
Patient reports chronic insomnia that has affected him for at least a year. States he often has trouble going to sleep and feels like his mind is racing. Mentions he is often fatigued during the day. When describing his nightly routine, mentions he usually watches television or is on his phone prior to going to bed. Denies alcohol consumption or exercise prior to bedtime. States he previously tried trazodone at bedtime, but he felt too drowsy the next morning.  A/P: -Likely insomnia related to his anxiety. Mentions he often forgets to take his nighttime Buspar, although he believes this could be causing vivid dreams. Discussed with patient he can trial off nighttime Buspar for the dreams, although this could help with his anxiety-related sleep problems. Can discuss different medication options during next visit. Might benefit from TCA if pregabalin does not improve fibromyalgia symptoms, as this could help with his fibromyalgia, depression, and insomnia.  -Discussed sleep hygiene, including decreasing screen time prior to bedtime and avoidance of caffeine, alcohol, and exercise late in the day.

## 2020-03-05 NOTE — Assessment & Plan Note (Signed)
Patient refuses influenza and COVID vaccines during our visit today.

## 2020-03-05 NOTE — Progress Notes (Signed)
   CC: fibromyalgia, blood pressure  HPI:  JimmyHrishikesh A Fisher is a 39 y.o. with medical history as listed below who presents to clinic for fibromyalgia and elevated blood pressure.  Past Medical History:  Diagnosis Date  . DVT (deep venous thrombosis) (HCC)   . GERD (gastroesophageal reflux disease)   . Hiatal hernia   . HIV (human immunodeficiency virus infection) (HCC)   . Hypercholesteremia   . Pulmonary embolism (HCC)    Review of Systems:  As per HPI.  Physical Exam:  Vitals:   03/05/20 0944  BP: (!) 142/92  Pulse: 72  Temp: 98 F (36.7 C)  TempSrc: Oral  SpO2: 100%  Weight: 193 lb 3.2 oz (87.6 kg)  Height: 5\' 6"  (1.676 m)   General: Sitting in chair in no acute distress CV: Regular rate, rhythm. No murmurs, gallops, rubs. Pulm: Clear to auscultation bilaterally. No wheezing, rales. Psych: Anxious-appearing. Speech normal, cognition normal.   Assessment & Plan:   See Encounters Tab for problem based charting.  Patient seen with Dr. 

## 2020-03-05 NOTE — Patient Instructions (Addendum)
Jimmy Fisher,  It was a pleasure seeing you today!  We discussed your fibromyalgia. We will add a new medication for you to take. It is called pregabalin (LYRICA), 75mg  twice per day. We can increase this in the future if we need to. If this medications is expensive, please let know and we can try a different medication.  We also discussed your anxiety and depression. Given you are experiencing some possible side effects and there were no benefits, we will decrease your fluoxetine (PROZAC). You will now take 20mg  once per day.  Continue seeing your infectious disease doctor for HIV treatment.  We will see you back in about a month to re-check your blood pressure and see how you are doing on this regimen.  We look forward to seeing you next time. Please call our clinic at [phone] if you have any questions or concerns. The best time to call is Monday-Friday from 9am-4pm, but there is someone available 24/7 at the same number. If you need medication refills, please notify your pharmacy one week in advance and they will send a request.  Thank you for letting [phone] take part in your care. Wishing you the best!  Thank you, Dr. [place], MD

## 2020-03-05 NOTE — Assessment & Plan Note (Signed)
Patient reports he continues to have fibromyalgia pain in his hands, shoulders, and hips. Mentions this has worsened over the last few months. States he previously was on gabapentin and Cymbalta, which did not improve the pain and gave him unwanted side effects (Cymbalta caused increased sweating). He says the pain is worse at night. Also mentions he does not exercise much.  A/P: -Start taking pregabalin 75mg , twice per day for fibromyalgia pain -Can increase dose or switch to amitryptiline if pain does not improve or if patient has unwanted side effects.

## 2020-03-05 NOTE — Assessment & Plan Note (Addendum)
Patient reports headache and increased blood pressure over the past few weeks. Mentions one blood pressure reading of 155/105 from a friend's cuff. Otherwise he does not take his blood pressure regularly. States Prozac has been working well for him since starting, although has not seen benefit since increasing from 20mg  to 40mg . He says he sees faith-based counseling, which has helped him tremendously. In the past he was seen by [facility] via virtual visits, but says they never contacted him back afterward. Mentions he has a great support system at home, especially since he was diagnosed with HIV. Mentions he has lost at least 20 pounds since the diagnosis due to loss of appetite.  BP Readings from Last 3 Encounters:  03/05/20 (!) 142/92  01/30/20 (!) 129/92  01/10/20 (!) 135/90   A/P: -Likely blood pressure related to anxiety/depression. PHQ-9 16 during this visit.  -Will decrease Prozac from 40mg  to 20mg , as he has had no anti-depressant or anxiolytic benefit, and it seems as if his worry over side effects has affected him more.  -Patient says he is willing to see 02/01/20 in person again. -Can consider mirtazapine for appetite stimulant if continues to have loss of appetite and weight loss

## 2020-03-06 NOTE — Progress Notes (Signed)
Internal Medicine Clinic Attending  I saw and evaluated the patient.  I personally confirmed the key portions of the history and exam documented by Dr. Braswell and I reviewed pertinent patient test results.  The assessment, diagnosis, and plan were formulated together and I agree with the documentation in the resident's note.  

## 2020-04-02 ENCOUNTER — Ambulatory Visit (INDEPENDENT_AMBULATORY_CARE_PROVIDER_SITE_OTHER): Payer: Self-pay | Admitting: Internal Medicine

## 2020-04-02 ENCOUNTER — Encounter: Payer: Self-pay | Admitting: Internal Medicine

## 2020-04-02 VITALS — BP 139/87 | HR 95 | Temp 98.7°F | Ht 66.0 in | Wt 198.2 lb

## 2020-04-02 DIAGNOSIS — I10 Essential (primary) hypertension: Secondary | ICD-10-CM

## 2020-04-02 DIAGNOSIS — B20 Human immunodeficiency virus [HIV] disease: Secondary | ICD-10-CM

## 2020-04-02 DIAGNOSIS — E782 Mixed hyperlipidemia: Secondary | ICD-10-CM

## 2020-04-02 DIAGNOSIS — M797 Fibromyalgia: Secondary | ICD-10-CM

## 2020-04-02 LAB — POCT GLYCOSYLATED HEMOGLOBIN (HGB A1C): Hemoglobin A1C: 5.1 % (ref 4.0–5.6)

## 2020-04-02 LAB — GLUCOSE, CAPILLARY: Glucose-Capillary: 79 mg/dL (ref 70–99)

## 2020-04-02 MED ORDER — LOSARTAN POTASSIUM 25 MG PO TABS
25.0000 mg | ORAL_TABLET | Freq: Every day | ORAL | 11 refills | Status: DC
Start: 2020-04-02 — End: 2020-04-02

## 2020-04-02 MED ORDER — PREGABALIN 75 MG PO CAPS: 75.0000 mg | ORAL_CAPSULE | Freq: Three times a day (TID) | ORAL | 2 refills | Status: DC

## 2020-04-02 MED ORDER — OLMESARTAN MEDOXOMIL 20 MG PO TABS: 20.0000 mg | ORAL_TABLET | Freq: Every day | ORAL | 1 refills | Status: DC

## 2020-04-02 MED ORDER — LORATADINE 10 MG PO TABS
10.0000 mg | ORAL_TABLET | Freq: Every day | ORAL | 2 refills | Status: DC | PRN
Start: 2020-04-02 — End: 2020-07-04

## 2020-04-02 MED ORDER — PREGABALIN 150 MG PO CAPS: 150.0000 mg | ORAL_CAPSULE | Freq: Two times a day (BID) | ORAL | 2 refills | Status: DC

## 2020-04-02 NOTE — Patient Instructions (Addendum)
Mr Owczarzak,  It was a pleasure seeing you in clinic. Today we discussed:   Fibromyalgia:  Increase lyrica to 150mg  twice daily.   Blood pressure: Start losartan 25mg  daily. Follow up in 2-3 weeks for BP check. Bring your blood pressure cuff to next visit to make sure it is accurate.   I am checking on your cholesterol at this visit. I will call you if there are any changes.   If you have any questions or concerns, please call our clinic at [phone] between 9am-5pm and after hours call [phone] and ask for the internal medicine resident on call. If you feel you are having a medical emergency please call 911.   Thank you, we look forward to helping you remain healthy!

## 2020-04-02 NOTE — Progress Notes (Signed)
   CC: fibromyalgia f/u   HPI:  [name] is a 39 y.o. male with PMHx as listed below presenting for follow up of his fibromyalgia and elevated BP readings at home. Please see problem based charting for complete assessment and plan  Past Medical History:  Diagnosis Date  . DVT (deep venous thrombosis) (HCC)   . GERD (gastroesophageal reflux disease)   . Hiatal hernia   . HIV (human immunodeficiency virus infection) (HCC)   . Hypercholesteremia   . Pulmonary embolism (HCC)    Review of Systems:  Negative except as stated in HPI.  Physical Exam:  Vitals:   04/02/20 1543  BP: 139/87  Pulse: 95  Temp: 98.7 F (37.1 C)  TempSrc: Oral  SpO2: 97%  Weight: 198 lb 3.2 oz (89.9 kg)  Height: 5\' 6"  (1.676 m)   Physical Exam  Constitutional: Appears well-developed and well-nourished. No distress.  HENT: Normocephalic and atraumatic, EOMI, conjunctiva normal Cardiovascular: Normal rate, regular rhythm, S1 and S2 present, no murmurs, rubs, gallops.  Distal pulses intact Respiratory: No respiratory distress, no accessory muscle use.  Effort is normal.  Lungs are clear to auscultation bilaterally. Musculoskeletal: Normal bulk and tone.  No peripheral edema noted.  Neurological: Is alert and oriented x4, no apparent focal deficits noted. Skin: Warm and dry.  No rash, erythema, lesions noted.  Assessment & Plan:   See Encounters Tab for problem based charting.  Patient discussed with Dr. 

## 2020-04-03 DIAGNOSIS — I10 Essential (primary) hypertension: Secondary | ICD-10-CM | POA: Insufficient documentation

## 2020-04-03 LAB — LIPID PANEL
Chol/HDL Ratio: 6.9 ratio — ABNORMAL HIGH (ref 0.0–5.0)
Cholesterol, Total: 235 mg/dL — ABNORMAL HIGH (ref 100–199)
HDL: 34 mg/dL — ABNORMAL LOW (ref >39)
LDL Chol Calc (NIH): 128 mg/dL — ABNORMAL HIGH (ref 0–99)
Triglycerides: 407 mg/dL — ABNORMAL HIGH (ref 0–149)
VLDL Cholesterol Cal: 73 mg/dL — ABNORMAL HIGH (ref 5–40)

## 2020-04-03 NOTE — Assessment & Plan Note (Signed)
Patient noted to have one SBP >140 at last visit. He endorses a bitemporal headache over the past several weeks and checked his blood pressure on two occasions and noted it to be 155/105 and 148/98. He denies any visual disturbances associated with the headaches, chest pain, dyspnea or focal weakness.   BP Readings from Last 3 Encounters:  04/02/20 139/87  03/05/20 (!) 142/92  01/30/20 (!) 129/92   A/P BP is 139/87 at this visit. Given that he had symptoms with moderately elevated home BP readings, will start on antihypertensive therapy. Olmesartan is on patient's formulary via [facility].  He does not have a BP cuff at home and notes that he checks with his friend's BP cuff. We did not have any in office to give him. Will follow up on this; and if present at next visit, will give him BP cuff to use at home.  Plan: Start olmesartan 20mg  daily F/u in 2-3 weeks for BP and BMP check

## 2020-04-03 NOTE — Assessment & Plan Note (Signed)
Patient reports ~20% improvement in his fibromyalgia pain in hands since starting pregablin. He notes previously having tried amitryptiline and multiple SSRIs without any significant relief. He is unable to do physical therapy at this time due to financial constraints.   Plan: Increase pregablin to 150mg  bid for fibromyalgia Refer to Physical therapy once has insurance

## 2020-04-03 NOTE — Assessment & Plan Note (Signed)
Patient is on Biktarvy for this. Viral load was detectable in June 2021 but CD4 count >600 at this time. Patient to continue Biktarvy and f/u with ID clinic.

## 2020-04-03 NOTE — Assessment & Plan Note (Addendum)
Lipid Panel     Component Value Date/Time   CHOL 235 (H) 04/02/2020 1633   TRIG 407 (H) 04/02/2020 1633   HDL 34 (L) 04/02/2020 1633   CHOLHDL 6.9 (H) 04/02/2020 1633   LDLCALC 128 (H) 04/02/2020 1633   LABVLDL 73 (H) 04/02/2020 1633   Patient mentions being told he has high cholesterol several years ago and was on a statin previously; although I am unable to find a prior lipid panel in his chart. Lipid panel currently with mildy elevated total cholesterol, LDL and slightly low HDL. ASCVD risk is 4.6%. HbA1c is 5.1 Given that his risk of major MI or stroke is <5%, will hold off on starting statin at this time and recommend for lifestyle modifications.  Plan: Encouraged lifestyle modifications and weight loss  F/u in 1 year

## 2020-04-07 NOTE — Progress Notes (Signed)
Internal Medicine Clinic Attending ° °Case discussed with Dr. Aslam  At the time of the visit.  We reviewed the resident’s history and exam and pertinent patient test results.  I agree with the assessment, diagnosis, and plan of care documented in the resident’s note.  °

## 2020-04-17 ENCOUNTER — Telehealth: Payer: Self-pay

## 2020-04-17 ENCOUNTER — Ambulatory Visit: Payer: Self-pay

## 2020-04-17 ENCOUNTER — Telehealth: Payer: Self-pay | Admitting: *Deleted

## 2020-04-17 NOTE — Telephone Encounter (Signed)
Patient has decided to get the Moderna covid vaccine, called to see if [facility] offers it.  We do not, so he will check with other [facility] clinics, will check with local pharmacies to see if they have moderna available. Andree Coss, RN

## 2020-04-17 NOTE — Telephone Encounter (Signed)
rtc to pt, he wanted info on where to get the vaccine this will be his first dose, gave him the [facility] ph#

## 2020-04-17 NOTE — Telephone Encounter (Signed)
Pt is requesting a call back [phone]

## 2020-04-24 ENCOUNTER — Ambulatory Visit: Payer: Self-pay

## 2020-04-30 ENCOUNTER — Other Ambulatory Visit: Payer: Self-pay

## 2020-04-30 ENCOUNTER — Telehealth: Payer: Self-pay

## 2020-04-30 DIAGNOSIS — I10 Essential (primary) hypertension: Secondary | ICD-10-CM

## 2020-04-30 MED ORDER — OLMESARTAN MEDOXOMIL 20 MG PO TABS: 20.0000 mg | ORAL_TABLET | Freq: Every day | ORAL | 1 refills | Status: DC

## 2020-04-30 NOTE — Telephone Encounter (Signed)
#  30 with 1 refill sent on 04/02/2020. Patient advised to call [facility] for refill. Will forward refill request for future refills. Kinnie Feil, BSN, RN-BC

## 2020-04-30 NOTE — Telephone Encounter (Signed)
Need refill on olmesartan (BENICAR) 20 MG tablet ;pt contact [phone]  [id number] - [place], [place] - [address] AT [facility] OF [address] & [address]

## 2020-04-30 NOTE — Telephone Encounter (Signed)
Patient called with questions regarding omeprazole refill. Spoke with [employer], patient has plenty of refills on file.   Sandie Ano, RN

## 2020-05-27 ENCOUNTER — Emergency Department (HOSPITAL_COMMUNITY): Payer: Self-pay

## 2020-05-27 ENCOUNTER — Encounter (HOSPITAL_COMMUNITY): Payer: Self-pay

## 2020-05-27 ENCOUNTER — Other Ambulatory Visit: Payer: Self-pay

## 2020-05-27 ENCOUNTER — Emergency Department (HOSPITAL_COMMUNITY)
Admission: EM | Admit: 2020-05-27 | Discharge: 2020-05-28 | Disposition: A | Discharge status: Home / Self Care | Payer: Self-pay | Attending: Emergency Medicine | Admitting: Emergency Medicine

## 2020-05-27 DIAGNOSIS — Z5321 Procedure and treatment not carried out due to patient leaving prior to being seen by health care provider: Secondary | ICD-10-CM | POA: Insufficient documentation

## 2020-05-27 DIAGNOSIS — M79602 Pain in left arm: Secondary | ICD-10-CM | POA: Insufficient documentation

## 2020-05-27 DIAGNOSIS — Y92410 Unspecified street and highway as the place of occurrence of the external cause: Secondary | ICD-10-CM | POA: Insufficient documentation
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
## 2020-05-28 NOTE — ED Notes (Signed)
Pt states that they are leaving  °

## 2020-05-29 ENCOUNTER — Other Ambulatory Visit: Payer: Self-pay | Admitting: Internal Medicine

## 2020-05-29 DIAGNOSIS — I10 Essential (primary) hypertension: Secondary | ICD-10-CM

## 2020-06-02 ENCOUNTER — Telehealth: Payer: Self-pay

## 2020-06-02 ENCOUNTER — Other Ambulatory Visit: Payer: Self-pay | Admitting: Pharmacist

## 2020-06-02 DIAGNOSIS — B20 Human immunodeficiency virus [HIV] disease: Secondary | ICD-10-CM

## 2020-06-02 NOTE — Telephone Encounter (Signed)
Received call from patient, he says the pharmacy told him his Biktarvy prescription had expired. RN called [facility], refill was sent in today by our staff and patient can pick up. RN informed patient that he should be able to pick up Biktarvy today. Reminded patient that additional refills will be prescribed at his appointment in January. Patient verbalized understanding and has no further questions.   Sandie Ano, RN

## 2020-06-10 ENCOUNTER — Ambulatory Visit: Payer: Self-pay

## 2020-06-10 ENCOUNTER — Other Ambulatory Visit: Payer: Self-pay

## 2020-06-16 ENCOUNTER — Other Ambulatory Visit: Payer: Self-pay

## 2020-06-27 ENCOUNTER — Other Ambulatory Visit: Payer: Self-pay

## 2020-07-01 ENCOUNTER — Encounter (HOSPITAL_BASED_OUTPATIENT_CLINIC_OR_DEPARTMENT_OTHER): Payer: Self-pay | Admitting: Orthopedic Surgery

## 2020-07-01 ENCOUNTER — Other Ambulatory Visit: Payer: Self-pay | Admitting: Infectious Diseases

## 2020-07-01 ENCOUNTER — Encounter: Payer: Self-pay | Admitting: Infectious Diseases

## 2020-07-01 DIAGNOSIS — B20 Human immunodeficiency virus [HIV] disease: Secondary | ICD-10-CM

## 2020-07-01 NOTE — Progress Notes (Signed)
Patient states he lacerated his left forearm yesterday and went to the ED where they used 11 staples for wound closure. Dr. Dietrich Pates scheduler was called and notified of laceration and recent alcohol use. Decided to postpone surgery for 2 weeks to reduce risk of infection. Attempted to call patient, voicemail is full. Morrie Sheldon at Dr. Ranell Patrick' office will attempt to reach patient as well.

## 2020-07-01 NOTE — H&P (Signed)
Patient's anticipated LOS is less than 2 midnights, meeting these requirements: - Younger than 90 - Lives within 1 hour of care - Has a competent adult at home to recover with post-op recover - NO history of  - Chronic pain requiring opiods  - Diabetes  - Coronary Artery Disease  - Heart failure  - Heart attack  - Stroke  - DVT/VTE  - Cardiac arrhythmia  - Respiratory Failure/COPD  - Renal failure  - Anemia  - Advanced Liver disease       Jimmy Fisher is an 40 y.o. male.    Chief Complaint: left shoulder pain  HPI: Pt is a 40 y.o. male complaining of left shoulder pain for multiple years. Pain had continually increased since the beginning. X-rays in the clinic show pan labral tear left shoulder. Pt has tried various conservative treatments which have failed to alleviate their symptoms, including injections and therapy. Various options are discussed with the patient. Risks, benefits and expectations were discussed with the patient. Patient understand the risks, benefits and expectations and wishes to proceed with surgery.   PCP:  Evlyn Kanner, MD  D/C Plans: Home  PMH: Past Medical History:  Diagnosis Date  . DVT (deep venous thrombosis) (HCC)   . GERD (gastroesophageal reflux disease)   . Hiatal hernia   . HIV (human immunodeficiency virus infection) (HCC)   . Hypercholesteremia   . Pulmonary embolism (HCC)     PSH: No past surgical history on file.  Social History:  reports that he has been smoking e-cigarettes. His smokeless tobacco use includes snuff. He reports that he does not drink alcohol and does not use drugs.  Allergies:  No Known Allergies  Medications: No current facility-administered medications for this encounter.   Current Outpatient Medications  Medication Sig Dispense Refill  . BENICAR 20 MG tablet TAKE 1 TABLET(20 MG) BY MOUTH DAILY 90 tablet 1  . BIKTARVY 50-200-25 MG TABS tablet TAKE 1 TABLET BY MOUTH DAILY 30 tablet 0  .  FLUoxetine (PROZAC) 20 MG capsule Take 1 capsule (20 mg total) by mouth daily. 30 capsule 0  . ibuprofen (ADVIL) 200 MG tablet Take 400 mg by mouth every 6 (six) hours as needed for headache or moderate pain.    Marland Kitchen loratadine (CLARITIN) 10 MG tablet Take 1 tablet (10 mg total) by mouth daily as needed for allergies. 30 tablet 2  . omeprazole (PRILOSEC) 20 MG capsule Take 1 capsule (20 mg total) by mouth daily. 90 capsule 3  . pregabalin (LYRICA) 150 MG capsule Take 1 capsule (150 mg total) by mouth 2 (two) times daily. 60 capsule 2    No results found for this or any previous visit (from the past 48 hour(s)). No results found.  ROS: Pain with rom of the left upper extremity  Physical Exam: Alert and oriented 40 y.o. male in no acute distress Cranial nerves 2-12 intact Cervical spine: full rom with no tenderness, nv intact distally Chest: active breath sounds bilaterally, no wheeze rhonchi or rales Heart: regular rate and rhythm, no murmur Abd: non tender non distended with active bowel sounds Hip is stable with rom  Left shoulder pain with rom  nv intact distally Strength of ER and IR 4/5 as compared with right  Assessment/Plan Assessment: left shoulder pan labral tear  Plan:  Patient will undergo a left shoulder scope by Dr. Ranell Patrick at [facility] Risks benefits and expectations were discussed with the patient. Patient understand risks, benefits and expectations and wishes  to proceed. Preoperative templating of the joint replacement has been completed, documented, and submitted to the Operating Room personnel in order to optimize intra-operative equipment management.   Alphonsa Overall PA-C, MPAS [facility] is now [employer] [address], [place], [place] [address] Phone: [phone] [email or web address] Facebook  [employer]

## 2020-07-02 ENCOUNTER — Other Ambulatory Visit: Payer: Self-pay

## 2020-07-04 ENCOUNTER — Other Ambulatory Visit: Payer: Self-pay

## 2020-07-04 ENCOUNTER — Ambulatory Visit (HOSPITAL_BASED_OUTPATIENT_CLINIC_OR_DEPARTMENT_OTHER): Admit: 2020-07-04 | Payer: No Typology Code available for payment source | Admitting: Orthopedic Surgery

## 2020-07-04 ENCOUNTER — Encounter (HOSPITAL_BASED_OUTPATIENT_CLINIC_OR_DEPARTMENT_OTHER): Payer: Self-pay

## 2020-07-04 ENCOUNTER — Encounter (HOSPITAL_BASED_OUTPATIENT_CLINIC_OR_DEPARTMENT_OTHER): Payer: Self-pay | Admitting: Orthopedic Surgery

## 2020-07-04 HISTORY — DX: Essential (primary) hypertension: I10

## 2020-07-04 HISTORY — DX: Anxiety disorder, unspecified: F41.9

## 2020-07-04 HISTORY — DX: Depression, unspecified: F32.A

## 2020-07-04 SURGERY — SHOULDER ARTHROSCOPY WITH SUBACROMIAL DECOMPRESSION
Anesthesia: General | Laterality: Left

## 2020-07-06 ENCOUNTER — Encounter (HOSPITAL_COMMUNITY): Payer: Self-pay | Admitting: Anesthesiology

## 2020-07-07 ENCOUNTER — Ambulatory Visit (HOSPITAL_BASED_OUTPATIENT_CLINIC_OR_DEPARTMENT_OTHER): Admission: RE | Admit: 2020-07-07 | Payer: Self-pay | Source: Home / Self Care | Admitting: Orthopedic Surgery

## 2020-07-07 HISTORY — DX: Myoneural disorder, unspecified: G70.9

## 2020-07-07 SURGERY — WOUND EXPLORATION
Anesthesia: Regional | Laterality: Left

## 2020-07-11 ENCOUNTER — Other Ambulatory Visit: Payer: Self-pay

## 2020-07-11 ENCOUNTER — Encounter: Payer: Self-pay | Admitting: Infectious Diseases

## 2020-07-16 ENCOUNTER — Other Ambulatory Visit: Payer: Self-pay

## 2020-07-16 ENCOUNTER — Ambulatory Visit (HOSPITAL_COMMUNITY)
Admission: EM | Admit: 2020-07-16 | Discharge: 2020-07-16 | Disposition: A | Discharge status: Home / Self Care | Payer: Self-pay | Attending: Emergency Medicine | Admitting: Emergency Medicine

## 2020-07-16 ENCOUNTER — Encounter (HOSPITAL_COMMUNITY): Payer: Self-pay | Admitting: Emergency Medicine

## 2020-07-16 ENCOUNTER — Inpatient Hospital Stay: Admit: 2020-07-16 | Payer: No Typology Code available for payment source | Admitting: Orthopedic Surgery

## 2020-07-16 ENCOUNTER — Encounter (HOSPITAL_COMMUNITY)
Admission: EM | Disposition: A | Discharge status: Home / Self Care | Payer: Self-pay | Source: Home / Self Care | Attending: Emergency Medicine

## 2020-07-16 ENCOUNTER — Emergency Department (HOSPITAL_COMMUNITY): Payer: Self-pay | Admitting: Certified Registered"

## 2020-07-16 DIAGNOSIS — Z86718 Personal history of other venous thrombosis and embolism: Secondary | ICD-10-CM | POA: Insufficient documentation

## 2020-07-16 DIAGNOSIS — S51812A Laceration without foreign body of left forearm, initial encounter: Secondary | ICD-10-CM | POA: Insufficient documentation

## 2020-07-16 DIAGNOSIS — Z21 Asymptomatic human immunodeficiency virus [HIV] infection status: Secondary | ICD-10-CM | POA: Insufficient documentation

## 2020-07-16 DIAGNOSIS — S51802A Unspecified open wound of left forearm, initial encounter: Secondary | ICD-10-CM

## 2020-07-16 DIAGNOSIS — I10 Essential (primary) hypertension: Secondary | ICD-10-CM | POA: Insufficient documentation

## 2020-07-16 DIAGNOSIS — Z86711 Personal history of pulmonary embolism: Secondary | ICD-10-CM | POA: Insufficient documentation

## 2020-07-16 DIAGNOSIS — Z20822 Contact with and (suspected) exposure to covid-19: Secondary | ICD-10-CM | POA: Insufficient documentation

## 2020-07-16 DIAGNOSIS — X58XXXA Exposure to other specified factors, initial encounter: Secondary | ICD-10-CM | POA: Insufficient documentation

## 2020-07-16 DIAGNOSIS — S56522A Laceration of other extensor muscle, fascia and tendon at forearm level, left arm, initial encounter: Secondary | ICD-10-CM

## 2020-07-16 DIAGNOSIS — Z5189 Encounter for other specified aftercare: Secondary | ICD-10-CM

## 2020-07-16 DIAGNOSIS — F17290 Nicotine dependence, other tobacco product, uncomplicated: Secondary | ICD-10-CM | POA: Insufficient documentation

## 2020-07-16 HISTORY — PX: I & D EXTREMITY: SHX5045

## 2020-07-16 LAB — COMPREHENSIVE METABOLIC PANEL
ALT: 36 U/L (ref 0–44)
AST: 28 U/L (ref 15–41)
Albumin: 3.4 g/dL — ABNORMAL LOW (ref 3.5–5.0)
Alkaline Phosphatase: 60 U/L (ref 38–126)
Anion gap: 10 (ref 5–15)
BUN: 13 mg/dL (ref 6–20)
CO2: 21 mmol/L — ABNORMAL LOW (ref 22–32)
Calcium: 8.7 mg/dL — ABNORMAL LOW (ref 8.9–10.3)
Chloride: 107 mmol/L (ref 98–111)
Creatinine, Ser: 0.89 mg/dL (ref 0.61–1.24)
GFR, Estimated: >60 mL/min (ref >60)
Glucose, Bld: 98 mg/dL (ref 70–99)
Potassium: 4.1 mmol/L (ref 3.5–5.1)
Sodium: 138 mmol/L (ref 135–145)
Total Bilirubin: 0.5 mg/dL (ref 0.3–1.2)
Total Protein: 6.2 g/dL — ABNORMAL LOW (ref 6.5–8.1)

## 2020-07-16 LAB — CBC WITH DIFFERENTIAL/PLATELET
Abs Immature Granulocytes: 0.03 10*3/uL (ref 0.00–0.07)
Basophils Absolute: 0.1 10*3/uL (ref 0.0–0.1)
Basophils Relative: 1 %
Eosinophils Absolute: 0.2 10*3/uL (ref 0.0–0.5)
Eosinophils Relative: 4 %
HCT: 43.7 % (ref 39.0–52.0)
Hemoglobin: 14.5 g/dL (ref 13.0–17.0)
Immature Granulocytes: 1 %
Lymphocytes Relative: 38 %
Lymphs Abs: 2.1 10*3/uL (ref 0.7–4.0)
MCH: 30.5 pg (ref 26.0–34.0)
MCHC: 33.2 g/dL (ref 30.0–36.0)
MCV: 91.8 fL (ref 80.0–100.0)
Monocytes Absolute: 0.6 10*3/uL (ref 0.1–1.0)
Monocytes Relative: 10 %
Neutro Abs: 2.5 10*3/uL (ref 1.7–7.7)
Neutrophils Relative %: 46 %
Platelets: 216 10*3/uL (ref 150–400)
RBC: 4.76 MIL/uL (ref 4.22–5.81)
RDW: 12.9 % (ref 11.5–15.5)
WBC: 5.5 10*3/uL (ref 4.0–10.5)
nRBC: 0 % (ref 0.0–0.2)

## 2020-07-16 LAB — SARS CORONAVIRUS 2 BY RT PCR (HOSPITAL ORDER, PERFORMED IN ~~LOC~~ HOSPITAL LAB): SARS Coronavirus 2: NEGATIVE

## 2020-07-16 LAB — PROTIME-INR
INR: 1 (ref 0.8–1.2)
Prothrombin Time: 13.2 seconds (ref 11.4–15.2)

## 2020-07-16 SURGERY — IRRIGATION AND DEBRIDEMENT EXTREMITY
Anesthesia: Choice | Laterality: Left

## 2020-07-16 SURGERY — IRRIGATION AND DEBRIDEMENT EXTREMITY
Anesthesia: General | Site: Arm Lower | Laterality: Left

## 2020-07-16 MED ORDER — CHLORHEXIDINE GLUCONATE 0.12 % MT SOLN
15.0000 mL | OROMUCOSAL | Status: AC
  Administered 2020-07-16: 15 mL via OROMUCOSAL
  Filled 2020-07-16 (×2): qty 15

## 2020-07-16 MED ORDER — LIDOCAINE 2% (20 MG/ML) 5 ML SYRINGE
INTRAMUSCULAR | Status: DC | PRN
  Administered 2020-07-16: 60 mg via INTRAVENOUS

## 2020-07-16 MED ORDER — PROPOFOL 10 MG/ML IV BOLUS
INTRAVENOUS | Status: DC | PRN
  Administered 2020-07-16: 200 mg via INTRAVENOUS

## 2020-07-16 MED ORDER — MIDAZOLAM HCL 5 MG/5ML IJ SOLN
INTRAMUSCULAR | Status: DC | PRN
  Administered 2020-07-16: 2 mg via INTRAVENOUS

## 2020-07-16 MED ORDER — OXYCODONE-ACETAMINOPHEN 5-325 MG PO TABS
ORAL_TABLET | ORAL | Status: AC
  Filled 2020-07-16: qty 1

## 2020-07-16 MED ORDER — PROPOFOL 1000 MG/100ML IV EMUL
INTRAVENOUS | Status: AC
  Filled 2020-07-16: qty 100

## 2020-07-16 MED ORDER — PROPOFOL 10 MG/ML IV BOLUS
INTRAVENOUS | Status: AC
  Filled 2020-07-16: qty 20

## 2020-07-16 MED ORDER — FENTANYL CITRATE (PF) 100 MCG/2ML IJ SOLN: 25.0000 ug | INTRAMUSCULAR | Status: DC | PRN

## 2020-07-16 MED ORDER — MIDAZOLAM HCL 2 MG/2ML IJ SOLN
INTRAMUSCULAR | Status: AC
  Filled 2020-07-16: qty 2

## 2020-07-16 MED ORDER — ONDANSETRON HCL 4 MG/2ML IJ SOLN
INTRAMUSCULAR | Status: DC | PRN
  Administered 2020-07-16: 4 mg via INTRAVENOUS

## 2020-07-16 MED ORDER — FENTANYL CITRATE (PF) 250 MCG/5ML IJ SOLN
INTRAMUSCULAR | Status: AC
  Filled 2020-07-16: qty 5

## 2020-07-16 MED ORDER — BUPIVACAINE HCL (PF) 0.25 % IJ SOLN
INTRAMUSCULAR | Status: AC
  Filled 2020-07-16: qty 30

## 2020-07-16 MED ORDER — FENTANYL CITRATE (PF) 250 MCG/5ML IJ SOLN
INTRAMUSCULAR | Status: DC | PRN
  Administered 2020-07-16 (×3): 50 ug via INTRAVENOUS

## 2020-07-16 MED ORDER — OXYCODONE-ACETAMINOPHEN 5-325 MG PO TABS: 1.0000 | ORAL_TABLET | ORAL | 0 refills | Status: DC | PRN

## 2020-07-16 MED ORDER — DEXAMETHASONE SODIUM PHOSPHATE 10 MG/ML IJ SOLN
INTRAMUSCULAR | Status: DC | PRN
  Administered 2020-07-16: 4 mg via INTRAVENOUS

## 2020-07-16 MED ORDER — OXYCODONE-ACETAMINOPHEN 5-325 MG PO TABS
1.0000 | ORAL_TABLET | Freq: Once | ORAL | Status: AC
  Administered 2020-07-16: 1 via ORAL

## 2020-07-16 MED ORDER — LACTATED RINGERS IV SOLN: INTRAVENOUS | Status: DC

## 2020-07-16 MED ORDER — POVIDONE-IODINE 10 % EX SWAB: 2.0000 "application " | Freq: Once | CUTANEOUS | Status: DC

## 2020-07-16 MED ORDER — PHENYLEPHRINE 40 MCG/ML (10ML) SYRINGE FOR IV PUSH (FOR BLOOD PRESSURE SUPPORT): PREFILLED_SYRINGE | INTRAVENOUS | Status: DC | PRN

## 2020-07-16 MED ORDER — CEFAZOLIN SODIUM-DEXTROSE 2-4 GM/100ML-% IV SOLN
2.0000 g | INTRAVENOUS | Status: AC
  Administered 2020-07-16: 2 g via INTRAVENOUS
  Filled 2020-07-16: qty 100

## 2020-07-16 MED ORDER — BUPIVACAINE HCL (PF) 0.25 % IJ SOLN
INTRAMUSCULAR | Status: DC | PRN
  Administered 2020-07-16: 10 mL

## 2020-07-16 MED ORDER — CHLORHEXIDINE GLUCONATE 4 % EX LIQD: 60.0000 mL | Freq: Once | CUTANEOUS | Status: DC

## 2020-07-16 MED ORDER — SODIUM CHLORIDE 0.9 % IR SOLN
Status: DC | PRN
  Administered 2020-07-16: 1000 mL

## 2020-07-16 SURGICAL SUPPLY — 42 items
BNDG CMPR 9X4 STRL LF SNTH (GAUZE/BANDAGES/DRESSINGS) ×1
BNDG ELASTIC 3X5.8 VLCR STR LF (GAUZE/BANDAGES/DRESSINGS) ×2 IMPLANT
BNDG ELASTIC 4X5.8 VLCR STR LF (GAUZE/BANDAGES/DRESSINGS) ×2 IMPLANT
BNDG ESMARK 4X9 LF (GAUZE/BANDAGES/DRESSINGS) ×2 IMPLANT
BNDG GAUZE ELAST 4 BULKY (GAUZE/BANDAGES/DRESSINGS) ×2 IMPLANT
CORD BIPOLAR FORCEPS 12FT (ELECTRODE) ×2 IMPLANT
COVER SURGICAL LIGHT HANDLE (MISCELLANEOUS) ×2 IMPLANT
CUFF TOURN SGL QUICK 18X4 (TOURNIQUET CUFF) ×2 IMPLANT
DRAPE SURG 17X23 STRL (DRAPES) ×2 IMPLANT
DRSG ADAPTIC 3X8 NADH LF (GAUZE/BANDAGES/DRESSINGS) ×2 IMPLANT
ELECT REM PT RETURN 9FT ADLT (ELECTROSURGICAL) ×2
ELECTRODE REM PT RTRN 9FT ADLT (ELECTROSURGICAL) IMPLANT
GAUZE SPONGE 4X4 12PLY STRL (GAUZE/BANDAGES/DRESSINGS) ×2 IMPLANT
GLOVE BIOGEL PI IND STRL 8.5 (GLOVE) ×1 IMPLANT
GLOVE BIOGEL PI INDICATOR 8.5 (GLOVE) ×1
GLOVE SURG ORTHO 8.0 STRL STRW (GLOVE) ×2 IMPLANT
GOWN STRL REUS W/ TWL LRG LVL3 (GOWN DISPOSABLE) ×3 IMPLANT
GOWN STRL REUS W/ TWL XL LVL3 (GOWN DISPOSABLE) ×1 IMPLANT
GOWN STRL REUS W/TWL LRG LVL3 (GOWN DISPOSABLE) ×6
GOWN STRL REUS W/TWL XL LVL3 (GOWN DISPOSABLE) ×2
KIT BASIN OR (CUSTOM PROCEDURE TRAY) ×2 IMPLANT
KIT TURNOVER KIT B (KITS) ×2 IMPLANT
NDL HYPO 25GX1X1/2 BEV (NEEDLE) IMPLANT
NEEDLE HYPO 25GX1X1/2 BEV (NEEDLE) ×2 IMPLANT
NS IRRIG 1000ML POUR BTL (IV SOLUTION) ×2 IMPLANT
PACK ORTHO EXTREMITY (CUSTOM PROCEDURE TRAY) ×2 IMPLANT
PAD ARMBOARD 7.5X6 YLW CONV (MISCELLANEOUS) ×3 IMPLANT
PAD CAST 4YDX4 CTTN HI CHSV (CAST SUPPLIES) ×1 IMPLANT
PADDING CAST COTTON 4X4 STRL (CAST SUPPLIES) ×2
SPLINT FIBERGLASS 4X30 (CAST SUPPLIES) ×1 IMPLANT
SPONGE LAP 18X18 RF (DISPOSABLE) ×2 IMPLANT
SPONGE LAP 4X18 RFD (DISPOSABLE) ×2 IMPLANT
SUT ETHILON 4 0 PS 2 18 (SUTURE) IMPLANT
SUT ETHILON 5 0 P 3 18 (SUTURE)
SUT NYLON ETHILON 5-0 P-3 1X18 (SUTURE) IMPLANT
SUT PROLENE 3 0 PS 1 (SUTURE) ×1 IMPLANT
SYR CONTROL 10ML LL (SYRINGE) ×1 IMPLANT
TOWEL GREEN STERILE (TOWEL DISPOSABLE) ×2 IMPLANT
TOWEL GREEN STERILE FF (TOWEL DISPOSABLE) ×2 IMPLANT
TUBE CONNECTING 12X1/4 (SUCTIONS) ×2 IMPLANT
UNDERPAD 30X36 HEAVY ABSORB (UNDERPADS AND DIAPERS) ×2 IMPLANT
YANKAUER SUCT BULB TIP NO VENT (SUCTIONS) ×2 IMPLANT

## 2020-07-16 NOTE — Anesthesia Preprocedure Evaluation (Signed)
Anesthesia Evaluation  Patient identified by MRN, date of birth, ID band Patient awake    Reviewed: Allergy & Precautions, NPO status , Patient's Chart, lab work & pertinent test results  Airway Mallampati: II  TM Distance: >3 FB     Dental   Pulmonary Current Smoker and Patient abstained from smoking.,    breath sounds clear to auscultation       Cardiovascular hypertension,  Rhythm:Regular Rate:Normal     Neuro/Psych PSYCHIATRIC DISORDERS Anxiety Depression  Neuromuscular disease    GI/Hepatic Neg liver ROS, hiatal hernia, GERD  ,  Endo/Other  negative endocrine ROS  Renal/GU negative Renal ROS     Musculoskeletal  (+) Fibromyalgia -  Abdominal   Peds  Hematology   Anesthesia Other Findings   Reproductive/Obstetrics                             Anesthesia Physical Anesthesia Plan  ASA: III  Anesthesia Plan: General   Post-op Pain Management:    Induction: Intravenous  PONV Risk Score and Plan: 2 and Ondansetron, Dexamethasone and Midazolam  Airway Management Planned: LMA  Additional Equipment:   Intra-op Plan:   Post-operative Plan: Extubation in OR  Informed Consent: I have reviewed the patients History and Physical, chart, labs and discussed the procedure including the risks, benefits and alternatives for the proposed anesthesia with the patient or authorized representative who has indicated his/her understanding and acceptance.     Dental advisory given  Plan Discussed with: CRNA and Anesthesiologist  Anesthesia Plan Comments:         Anesthesia Quick Evaluation

## 2020-07-16 NOTE — ED Provider Notes (Signed)
[facility]-EMERGENCY DEPT Provider Note   CSN: [id number] Arrival date & time: 07/16/20  0801     History Chief Complaint  Patient presents with  . Wound Check    Jimmy Fisher is a 40 y.o. male.  HPI 40 year old male with a history of anxiety, depression, DVT, GERD, HIV, hypercholesteremia, hypertension, fibromyalgia, PE presents to the ER with complaints of a laceration to his left forearm. States that the laceration occurred in December 2021. He states that it was stapled together, and when the staples were taken out, the wound opened back up. He attempted to manage the wound without surgery, cleaning it daily. He is followed by Dr. Orlan Leavens, called his office today, and was told to come to the ER to have this surgically fixed. He reports that initially he lied and stated that he accidentally cut himself while sharpening a knife, though it actually was purposefully self-inflicted. He states that he was drinking a lot that night, had been kicked out by his wife of 20 years and had an urge to hurt himself. He denies any SI/HI currently. States he has not had a drink since then. Last meal was last night at 9 PM.    Past Medical History:  Diagnosis Date  . Anxiety   . Depression   . DVT (deep venous thrombosis) (HCC)    occurred during hospitalization for overdose  . GERD (gastroesophageal reflux disease)   . Hiatal hernia   . HIV (human immunodeficiency virus infection) (HCC)   . Hypercholesteremia   . Hypertension   . Neuromuscular disorder (HCC)    fibromyalgia  . Pulmonary embolism (HCC)    2012    Patient Active Problem List   Diagnosis Date Noted  . Hypertension 04/03/2020  . 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 Generalized anxiety disorder 01/30/2020  . Flank pain 01/10/2020  . Healthcare maintenance 01/10/2020  . Fibromyalgia 01/10/2020  . Hyperlipidemia 01/10/2020  . GERD (gastroesophageal reflux disease) 12/18/2019  . HIV disease (HCC)   .  Opioid dependence with opioid-induced mood disorder (HCC)   . 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 No pertinent surgical history.     No family history on file.  Social History   Tobacco Use  . Smoking status: Current Every Day Smoker    Types: E-cigarettes  . Smokeless tobacco: Current User    Types: Snuff  Vaping Use  . Vaping Use: Never used  Substance Use Topics  . Alcohol use: Not Currently    Comment: quit 06/2020  . Drug use: Not Currently    Comment: recovered addict from 2012    Home Medications Prior to Admission medications   Medication Sig Start Date End Date Taking? Authorizing Provider  BENICAR 20 MG tablet TAKE 1 TABLET(20 MG) BY MOUTH DAILY Patient taking differently: Take 20 mg by mouth daily. 05/29/20   Evlyn Kanner, MD  BIKTARVY 50-200-25 MG TABS tablet TAKE 1 TABLET BY MOUTH DAILY Patient taking differently: Take 1 tablet by mouth daily. 07/01/20   Ginnie Smart, MD  FLUoxetine (PROZAC) 20 MG capsule Take 1 capsule (20 mg total) by mouth daily. 03/05/20 04/04/20  Evlyn Kanner, MD  ibuprofen (ADVIL) 200 MG tablet Take 400 mg by mouth every 6 (six) hours as needed for headache or moderate pain.    [provider]  omeprazole (PRILOSEC) 20 MG capsule Take 1 capsule (20 mg total) by mouth daily. 12/18/19   Ginnie Smart,  MD  pregabalin (LYRICA) 150 MG capsule Take 1 capsule (150 mg total) by mouth 2 (two) times daily. 04/02/20 07/01/20  Eliezer Bottom, MD    Allergies    Patient has no known allergies.  Review of Systems   Review of Systems  Constitutional: Negative for chills and fever.  Skin: Positive for color change and wound.  Neurological: Negative for weakness and numbness.    Physical Exam Updated Vital Signs BP (!) 141/89 (BP Location: Right Arm)   Pulse 75   Temp 98.2 F (36.8 C) (Oral)   Resp 16   SpO2 97%   Physical Exam Vitals and nursing  note reviewed.  Constitutional:      Appearance: He is well-developed and well-nourished.  HENT:     Head: Normocephalic and atraumatic.  Eyes:     Conjunctiva/sclera: Conjunctivae normal.  Cardiovascular:     Rate and Rhythm: Normal rate and regular rhythm.     Heart sounds: No murmur heard.   Pulmonary:     Effort: Pulmonary effort is normal. No respiratory distress.     Breath sounds: Normal breath sounds.  Abdominal:     Palpations: Abdomen is soft.     Tenderness: There is no abdominal tenderness.  Musculoskeletal:        General: Signs of injury present. No swelling or edema. Normal range of motion.     Cervical back: Neck supple.     Comments: Large 3 to 4 cm laceration to the left anterior forearm with evidence of musculature involvement.  No evidence of infection, redness, swelling, drainage.  2+ radial pulses, gross sensations intact.  Full range of motion of pronation and supination.  Capillary refill intact.  Skin:    General: Skin is warm and dry.     Capillary Refill: Capillary refill takes less than 2 seconds.     Findings: Lesion present.  Neurological:     General: No focal deficit present.     Mental Status: He is alert and oriented to person, place, and time.  Psychiatric:        Mood and Affect: Mood and affect and mood normal.     ED Results / Procedures / Treatments   Labs (all labs ordered are listed, but only abnormal results are displayed) Labs Reviewed  SARS CORONAVIRUS 2 BY RT PCR (HOSPITAL ORDER, PERFORMED IN [facility])  CBC WITH DIFFERENTIAL/PLATELET  COMPREHENSIVE METABOLIC PANEL  PROTIME-INR    EKG None  Radiology No results found.  Procedures Procedures   Medications Ordered in ED Medications - No data to display  ED Course  I have reviewed the triage vital signs and the nursing notes.  Pertinent labs & imaging results that were available during my care of the patient were reviewed by me and considered in my  medical decision making (see chart for details).    MDM Rules/Calculators/A&P                          40 year old male who presents to the ER after speaking with Dr. Orlan Leavens, stating he is here for surgery for a laceration to his left anterior forearm which occurred back in December 2021.  He reports that it is infected, however does not appear infected to me, no discharge, redness, swelling.  Intact pronation and supination of the arm.  Neurovascularly intact.  Spoke with Earney Hamburg, PA-C who spoke with Dr. Orlan Leavens.  Dr. Orlan Leavens does want him in the OR  but over at [facility].  I was instructed to let the patient know that he needs to go to admissions at the front entrance of [facility].  He was instructed to not eat or drink, plan for surgery tonight.  Preop labs ordered.  Patient was instructed to have a driver take him home after the surgery.  He voiced understanding and is agreeable.  Final Clinical Impression(s) / ED Diagnoses Final diagnoses:  Visit for wound check    Rx / DC Orders ED Discharge Orders    None       Mare Ferrari, PA-C 07/16/20 [id number]    Milagros Loll, MD 07/16/20 [id number]

## 2020-07-16 NOTE — ED Triage Notes (Signed)
Patient had a deep laceration to his L forearm on December 17th 2021, had staples removed and it re-opened sometime last week. Pt called orthopedic and on-call surgeon told him to come to the ED. States the muscle is showing and it was oozing last week. Last food was yesterday around 11pm.

## 2020-07-16 NOTE — Op Note (Signed)
PREOPERATIVE DIAGNOSIS:LEFT FOREARM WOUND WITH TENDON INVOLVEMENT   POSTOPERATIVE DIAGNOSIS:SAME  ATTENDING SURGEON:DR. FRED ORTMANN WHO WAS SCRUBBED AND PRESENT FOR ENTIRE PROCEDURE  ASSISTANT SURGEON:NONE  ANESTHESIA:GENERAL VIA LMA  OPERATIVE PROCEDURE: Excisional debridement of skin subcutaneous tissue and muscle left forearm Left forearm repair extensor musculature EDC Left forearm repair extensor musculature wrist extensors  IMPLANTS:NONE none  DPO:[id number]  RADIOGRAPHIC INTERPRETATION:NONE  SURGICAL INDICATIONS: Patient is a right-hand-dominant gentleman who is referred from the ER after a self-inflicted injury to his left upper extremity.  Patient was seen and evaluated and recommended to undergo the above procedure.  Risks of surgery include but not limited to bleeding infection damage nearby nerves arteries or tendons loss of motion of the wrist and digits incomplete relief of symptoms and need for further surgical invention  SURGICAL TECHNIQUE: Patient was prepped identified in the preoperative holding area marked the permanent marker made on the left forearm indicate correct operative site.  Patient brought back operating placed supine on anesthesia table where the general anesthetic was administered.  Patient tolerated this via LMA.  Preoperative antibiotics were given prior to skin incision.  Esmarch exsanguination was then used and the tourniquet insufflated.  Following this excisional debridement was then carried out. Debridement type: Excisional Debridement  Side: left  Body Location: Left forearm   Tools used for debridement: scalpel, scissors and rongeur  Pre-debridement Wound size (cm):   Length: 6       Width: 2   Depth: 2 cm   Post-debridement Wound size (cm):   Length:   6     Width: 3     Depth: 3   Debridement depth beyond dead/damaged tissue down to healthy viable tissue: yes  Tissue layer involved: skin, subcutaneous tissue, muscle / fascia  Nature of  tissue removed: Devitalized Tissue  Irrigation volume: 1 liters     Irrigation fluid type: Normal Saline   Following debridement the patient did have the defect within the fascia over the [facility] and the common wrist extensors.  Following this the muscle laceration and extensor tendon was then repaired with 2-0 Vicryl suture.  This was done for both the wrist extensors and the digital extensors.  After repair of the muscular fascial layer the fascia layer closed very nicely without any hernia or defect.  The wound was then thoroughly irrigated.  The traumatic laceration was then repaired with Prolene suture.  Adaptic dressing a sterile compressive bandage applied.  The patient tolerated the procedure well was placed in a well-padded volar splint keeping the fingers and wrist in full extension.  Patient was then extubated taken recovery in good condition.   POSTOPERATIVE PLAN: Patient be discharged to home seen back in the office in 2 weeks for wound check suture removal sutures will be removed and then will allow him to gradually transition to a short arm wrist brace gradual use and activity.

## 2020-07-16 NOTE — Discharge Instructions (Addendum)
KEEP BANDAGE CLEAN AND DRY CALL OFFICE FOR F/U APPT [phone] IN 13 DAYS KEEP HAND ELEVATED ABOVE HEART OK TO APPLY ICE TO OPERATIVE AREA CONTACT OFFICE IF ANY WORSENING PAIN OR CONCERNS.  

## 2020-07-16 NOTE — Consult Note (Addendum)
Reason for Consult:Left FA lac Referring Physician: Myrtis Ser Time called: 0175 Time at bedside: 1050   Jimmy Fisher is an 40 y.o. male.  HPI: Reef cut himself intentionally on 1/17. It was repaired in ED. Healing was uneventful but he bumped it sometime after staples were removed and it dehisced. He tried to manage it non-operatively but finally decided to have it repaired. He is RHD.  Past Medical History:  Diagnosis Date  . Anxiety   . Depression   . DVT (deep venous thrombosis) (HCC)    occurred during hospitalization for overdose  . GERD (gastroesophageal reflux disease)   . Hiatal hernia   . HIV (human immunodeficiency virus infection) (HCC)   . Hypercholesteremia   . Hypertension   . Neuromuscular disorder (HCC)    fibromyalgia  . Pulmonary embolism (HCC)    2012    History reviewed. No pertinent surgical history.  No family history on file.  Social History:  reports that he has been smoking e-cigarettes. His smokeless tobacco use includes snuff. He reports previous alcohol use. He reports previous drug use.  Allergies: No Known Allergies  Medications: I have reviewed the patient's current medications.  Results for orders placed or performed during the hospital encounter of 07/16/20 (from the past 48 hour(s))  CBC with Differential     Status: None   Collection Time: 07/16/20  8:37 AM  Result Value Ref Range   WBC 5.5 4.0 - 10.5 K/uL   RBC 4.76 4.22 - 5.81 MIL/uL   Hemoglobin 14.5 13.0 - 17.0 g/dL   HCT 10.2 58.5 - 27.7 %   MCV 91.8 80.0 - 100.0 fL   MCH 30.5 26.0 - 34.0 pg   MCHC 33.2 30.0 - 36.0 g/dL   RDW 82.4 23.5 - 36.1 %   Platelets 216 150 - 400 K/uL   nRBC 0.0 0.0 - 0.2 %   Neutrophils Relative % 46 %   Neutro Abs 2.5 1.7 - 7.7 K/uL   Lymphocytes Relative 38 %   Lymphs Abs 2.1 0.7 - 4.0 K/uL   Monocytes Relative 10 %   Monocytes Absolute 0.6 0.1 - 1.0 K/uL   Eosinophils Relative 4 %   Eosinophils Absolute 0.2 0.0 - 0.5 K/uL   Basophils  Relative 1 %   Basophils Absolute 0.1 0.0 - 0.1 K/uL   Immature Granulocytes 1 %   Abs Immature Granulocytes 0.03 0.00 - 0.07 K/uL    Comment: Performed at [facility], [address], [place], [place] [address]  Comprehensive metabolic panel     Status: Abnormal   Collection Time: 07/16/20  8:37 AM  Result Value Ref Range   Sodium 138 135 - 145 mmol/L   Potassium 4.1 3.5 - 5.1 mmol/L   Chloride 107 98 - 111 mmol/L   CO2 21 (L) 22 - 32 mmol/L   Glucose, Bld 98 70 - 99 mg/dL    Comment: Glucose reference range applies only to samples taken after fasting for at least 8 hours.   BUN 13 6 - 20 mg/dL   Creatinine, Ser 4.00 0.61 - 1.24 mg/dL   Calcium 8.7 (L) 8.9 - 10.3 mg/dL   Total Protein 6.2 (L) 6.5 - 8.1 g/dL   Albumin 3.4 (L) 3.5 - 5.0 g/dL   AST 28 15 - 41 U/L   ALT 36 0 - 44 U/L   Alkaline Phosphatase 60 38 - 126 U/L   Total Bilirubin 0.5 0.3 - 1.2 mg/dL   GFR,  Estimated >60 >60 mL/min    Comment: (NOTE) Calculated using the CKD-EPI Creatinine Equation (2021)    Anion gap 10 5 - 15    Comment: Performed at [facility], [address], [place], [place] [address]  Protime-INR     Status: None   Collection Time: 07/16/20  8:37 AM  Result Value Ref Range   Prothrombin Time 13.2 11.4 - 15.2 seconds   INR 1.0 0.8 - 1.2    Comment: (NOTE) INR goal varies based on device and disease states. Performed at [facility], [address] [place], [place] [address]   SARS Coronavirus 2 by RT PCR (hospital order, performed in [facility] hospital lab) Nasopharyngeal Nasopharyngeal Swab     Status: None   Collection Time: 07/16/20  8:39 AM   Specimen: Nasopharyngeal Swab  Result Value Ref Range   SARS Coronavirus 2 NEGATIVE NEGATIVE    Comment: (NOTE) SARS-CoV-2 target nucleic acids are NOT DETECTED.  The SARS-CoV-2 RNA is generally detectable in upper and lower respiratory specimens during the acute phase of infection. The  lowest concentration of SARS-CoV-2 viral copies this assay can detect is 250 copies / mL. A negative result does not preclude SARS-CoV-2 infection and should not be used as the sole basis for treatment or other patient management decisions.  A negative result may occur with improper specimen collection / handling, submission of specimen other than nasopharyngeal swab, presence of viral mutation(s) within the areas targeted by this assay, and inadequate number of viral copies (<250 copies / mL). A negative result must be combined with clinical observations, patient history, and epidemiological information.  Fact Sheet for Patients:   BoilerBrush.com.cy  Fact Sheet for Healthcare Providers: https://pope.com/  This test is not yet approved or  cleared by the [place] FDA and has been authorized for detection and/or diagnosis of SARS-CoV-2 by FDA under an Emergency Use Authorization (EUA).  This EUA will remain in effect (meaning this test can be used) for the duration of the COVID-19 declaration under Section 564(b)(1) of the Act, 21 U.S.C. section 360bbb-3(b)(1), unless the authorization is terminated or revoked sooner.  Performed at [facility], [address], [place], [place] [address]     No results found.  Review of Systems  HENT: Negative for ear discharge, ear pain, hearing loss and tinnitus.   Eyes: Negative for photophobia and pain.  Respiratory: Negative for cough and shortness of breath.   Cardiovascular: Negative for chest pain.  Gastrointestinal: Negative for abdominal pain, nausea and vomiting.  Genitourinary: Negative for dysuria, flank pain, frequency and urgency.  Musculoskeletal: Positive for arthralgias (Left FA). Negative for back pain, myalgias and neck pain.  Neurological: Negative for dizziness and headaches.  Hematological: Does not bruise/bleed easily.  Psychiatric/Behavioral: The  patient is not nervous/anxious.    Blood pressure (!) 141/89, pulse 75, temperature 98.2 F (36.8 C), temperature source Oral, resp. rate 16, SpO2 97 %. Physical Exam Constitutional:      General: He is not in acute distress.    Appearance: He is well-developed and well-nourished. He is not diaphoretic.  HENT:     Head: Normocephalic and atraumatic.  Eyes:     General: No scleral icterus.       Right eye: No discharge.        Left eye: No discharge.     Conjunctiva/sclera: Conjunctivae normal.  Cardiovascular:     Rate and Rhythm: Normal rate and regular rhythm.  Pulmonary:  Effort: Pulmonary effort is normal. No respiratory distress.  Musculoskeletal:     Cervical back: Normal range of motion.     Comments: Left shoulder, elbow, wrist, digits- Transverse lac dorsum FA, mod TTP, no instability, no blocks to motion  Sens  Ax/R/M/U intact  Mot   Ax/ R/ PIN/ M/ AIN/ U intact  Rad 2+  Skin:    General: Skin is warm and dry.  Neurological:     Mental Status: He is alert.  Psychiatric:        Mood and Affect: Mood and affect normal.        Behavior: Behavior normal.     Assessment/Plan: Left FA lac -- Plan I&D, repair by Dr. Melvyn Novas today at [facility]. Pt will report to admissions today for surgery around 5. Advised to remain NPO. Anticipate discharge after surgery.  R/B/A DISCUSSED WITH PT IN OFFICE.  PT VOICED UNDERSTANDING OF PLAN CONSENT SIGNED DAY OF SURGERY PT SEEN AND EXAMINED PRIOR TO OPERATIVE PROCEDURE/DAY OF SURGERY SITE MARKED. QUESTIONS ANSWERED WILL GO HOME FOLLOWING SURGERY  WE ARE PLANNING SURGERY FOR YOUR UPPER EXTREMITY. THE RISKS AND BENEFITS OF SURGERY INCLUDE BUT NOT LIMITED TO BLEEDING INFECTION, DAMAGE TO NEARBY NERVES ARTERIES TENDONS, FAILURE OF SURGERY TO ACCOMPLISH ITS INTENDED GOALS, PERSISTENT SYMPTOMS AND NEED FOR FURTHER SURGICAL INTERVENTION. WITH THIS IN MIND WE WILL PROCEED. I HAVE DISCUSSED WITH THE PATIENT THE PRE AND POSTOPERATIVE REGIMEN AND  THE DOS AND DON'TS. PT VOICED UNDERSTANDING AND INFORMED CONSENT SIGNED.  Bradly Bienenstock MD 07/16/20  Freeman Caldron, PA-C Orthopedic Surgery [phone] 07/16/2020, 10:55 AM

## 2020-07-16 NOTE — Anesthesia Postprocedure Evaluation (Signed)
Anesthesia Post Note  Patient: Jimmy Fisher  Procedure(s) Performed: IRRIGATION AND DEBRIDEMENT FOREARM (Left Arm Lower)     Patient location during evaluation: PACU Anesthesia Type: General Level of consciousness: awake Pain management: pain level controlled Vital Signs Assessment: post-procedure vital signs reviewed and stable Respiratory status: spontaneous breathing Cardiovascular status: stable Postop Assessment: no apparent nausea or vomiting Anesthetic complications: no   No complications documented.  Last Vitals:  Vitals:   07/16/20 1715 07/16/20 1730  BP: 127/81 123/82  Pulse: 80 67  Resp: 15 20  Temp:  36.7 C  SpO2: 98% 99%    Last Pain:  Vitals:   07/16/20 1730  TempSrc:   PainSc: 6                  Joyell Emami

## 2020-07-16 NOTE — Anesthesia Procedure Notes (Signed)
Procedure Name: LMA Insertion Date/Time: 07/16/2020 4:19 PM Performed by: Colon Flattery, CRNA Pre-anesthesia Checklist: Patient identified, Emergency Drugs available, Suction available and Patient being monitored Patient Re-evaluated:Patient Re-evaluated prior to induction Oxygen Delivery Method: Circle system utilized Preoxygenation: Pre-oxygenation with 100% oxygen Induction Type: IV induction LMA: LMA inserted LMA Size: 5.0 Placement Confirmation: positive ETCO2 Dental Injury: Teeth and Oropharynx as per pre-operative assessment

## 2020-07-16 NOTE — Transfer of Care (Signed)
Immediate Anesthesia Transfer of Care Note  Patient: Jimmy Fisher  Procedure(s) Performed: IRRIGATION AND DEBRIDEMENT FOREARM (Left Arm Lower)  Patient Location: PACU  Anesthesia Type:General  Level of Consciousness: awake, alert  and oriented  Airway & Oxygen Therapy: Patient Spontanous Breathing  Post-op Assessment: Report given to RN, Post -op Vital signs reviewed and stable and Patient moving all extremities  Post vital signs: Reviewed and stable  Last Vitals:  Vitals Value Taken Time  BP 127/76 07/16/20 1703  Temp    Pulse 83 07/16/20 1704  Resp 19 07/16/20 1704  SpO2 98 % 07/16/20 1704  Vitals shown include unvalidated device data.  Last Pain:  Vitals:   07/16/20 1517  TempSrc:   PainSc: 7       Patients Stated Pain Goal: 3 (07/16/20 1517)  Complications: No complications documented.

## 2020-07-17 ENCOUNTER — Encounter (HOSPITAL_COMMUNITY): Payer: Self-pay | Admitting: Orthopedic Surgery

## 2020-07-18 ENCOUNTER — Encounter: Payer: Self-pay | Admitting: Infectious Diseases

## 2020-07-18 ENCOUNTER — Ambulatory Visit: Admit: 2020-07-18 | Payer: No Typology Code available for payment source | Admitting: Orthopedic Surgery

## 2020-07-18 SURGERY — SHOULDER ARTHROSCOPY WITH SUBACROMIAL DECOMPRESSION
Anesthesia: General | Laterality: Left

## 2020-07-25 ENCOUNTER — Encounter: Payer: Self-pay | Admitting: Infectious Diseases

## 2020-07-28 ENCOUNTER — Other Ambulatory Visit: Payer: Self-pay

## 2020-07-28 ENCOUNTER — Ambulatory Visit: Payer: Self-pay

## 2020-07-28 DIAGNOSIS — Z113 Encounter for screening for infections with a predominantly sexual mode of transmission: Secondary | ICD-10-CM

## 2020-07-28 DIAGNOSIS — B20 Human immunodeficiency virus [HIV] disease: Secondary | ICD-10-CM

## 2020-07-28 DIAGNOSIS — Z79899 Other long term (current) drug therapy: Secondary | ICD-10-CM

## 2020-07-29 LAB — T-HELPER CELL (CD4) - (RCID CLINIC ONLY)
CD4 % Helper T Cell: 26 % — ABNORMAL LOW (ref 33–65)
CD4 T Cell Abs: 815 /uL (ref 400–1790)

## 2020-07-31 LAB — LIPID PANEL
Cholesterol: 228 mg/dL — ABNORMAL HIGH (ref <200)
HDL: 41 mg/dL (ref >=40)
LDL Cholesterol (Calc): 148 mg/dL (calc) — ABNORMAL HIGH
Non-HDL Cholesterol (Calc): 187 mg/dL (calc) — ABNORMAL HIGH (ref <130)
Total CHOL/HDL Ratio: 5.6 (calc) — ABNORMAL HIGH (ref <5.0)
Triglycerides: 251 mg/dL — ABNORMAL HIGH (ref <150)

## 2020-07-31 LAB — COMPREHENSIVE METABOLIC PANEL
AG Ratio: 1.6 (calc) (ref 1.0–2.5)
ALT: 40 U/L (ref 9–46)
AST: 26 U/L (ref 10–40)
Albumin: 4.4 g/dL (ref 3.6–5.1)
Alkaline phosphatase (APISO): 79 U/L (ref 36–130)
BUN: 13 mg/dL (ref 7–25)
CO2: 30 mmol/L (ref 20–32)
Calcium: 9.7 mg/dL (ref 8.6–10.3)
Chloride: 105 mmol/L (ref 98–110)
Creat: 1.19 mg/dL (ref 0.60–1.35)
Globulin: 2.7 g/dL (calc) (ref 1.9–3.7)
Glucose, Bld: 77 mg/dL (ref 65–99)
Potassium: 4.7 mmol/L (ref 3.5–5.3)
Sodium: 141 mmol/L (ref 135–146)
Total Bilirubin: 0.5 mg/dL (ref 0.2–1.2)
Total Protein: 7.1 g/dL (ref 6.1–8.1)

## 2020-07-31 LAB — RPR: RPR Ser Ql: NONREACTIVE

## 2020-07-31 LAB — CBC
HCT: 45.1 % (ref 38.5–50.0)
Hemoglobin: 15.3 g/dL (ref 13.2–17.1)
MCH: 29.9 pg (ref 27.0–33.0)
MCHC: 33.9 g/dL (ref 32.0–36.0)
MCV: 88.3 fL (ref 80.0–100.0)
MPV: 9 fL (ref 7.5–12.5)
Platelets: 289 10*3/uL (ref 140–400)
RBC: 5.11 10*6/uL (ref 4.20–5.80)
RDW: 13.6 % (ref 11.0–15.0)
WBC: 8.8 10*3/uL (ref 3.8–10.8)

## 2020-07-31 LAB — HIV-1 RNA QUANT-NO REFLEX-BLD
HIV 1 RNA Quant: 58 Copies/mL — ABNORMAL HIGH
HIV-1 RNA Quant, Log: 1.76 Log cps/mL — ABNORMAL HIGH

## 2020-08-01 ENCOUNTER — Encounter: Payer: Self-pay | Admitting: Infectious Diseases

## 2020-08-12 ENCOUNTER — Ambulatory Visit (INDEPENDENT_AMBULATORY_CARE_PROVIDER_SITE_OTHER): Payer: Self-pay | Admitting: Orthopaedic Surgery

## 2020-08-12 DIAGNOSIS — S43432A Superior glenoid labrum lesion of left shoulder, initial encounter: Secondary | ICD-10-CM
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
## 2020-08-15 ENCOUNTER — Ambulatory Visit: Payer: Self-pay | Admitting: Infectious Diseases

## 2020-08-18 ENCOUNTER — Other Ambulatory Visit: Payer: Self-pay

## 2020-08-18 ENCOUNTER — Ambulatory Visit: Payer: Self-pay

## 2020-08-18 ENCOUNTER — Ambulatory Visit (INDEPENDENT_AMBULATORY_CARE_PROVIDER_SITE_OTHER): Payer: Self-pay | Admitting: Family Medicine

## 2020-08-18 DIAGNOSIS — S43432A Superior glenoid labrum lesion of left shoulder, initial encounter: Secondary | ICD-10-CM

## 2020-08-18 NOTE — Patient Instructions (Signed)
   Hip:  Possible meralgia paresthetica.
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
## 2020-08-19 ENCOUNTER — Encounter: Payer: Self-pay | Admitting: Infectious Diseases

## 2020-08-19 ENCOUNTER — Ambulatory Visit (INDEPENDENT_AMBULATORY_CARE_PROVIDER_SITE_OTHER): Payer: Self-pay | Admitting: Infectious Diseases

## 2020-08-19 VITALS — BP 121/79 | HR 78 | Temp 98.1°F | Wt 196.2 lb

## 2020-08-19 DIAGNOSIS — K649 Unspecified hemorrhoids: Secondary | ICD-10-CM | POA: Insufficient documentation

## 2020-08-19 DIAGNOSIS — S43432D Superior glenoid labrum lesion of left shoulder, subsequent encounter: Secondary | ICD-10-CM

## 2020-08-19 DIAGNOSIS — R202 Paresthesia of skin: Secondary | ICD-10-CM

## 2020-08-19 DIAGNOSIS — Z23 Encounter for immunization: Secondary | ICD-10-CM

## 2020-08-19 DIAGNOSIS — I10 Essential (primary) hypertension: Secondary | ICD-10-CM

## 2020-08-19 DIAGNOSIS — Z113 Encounter for screening for infections with a predominantly sexual mode of transmission: Secondary | ICD-10-CM

## 2020-08-19 DIAGNOSIS — E782 Mixed hyperlipidemia: Secondary | ICD-10-CM

## 2020-08-19 DIAGNOSIS — S43439A Superior glenoid labrum lesion of unspecified shoulder, initial encounter: Secondary | ICD-10-CM | POA: Insufficient documentation

## 2020-08-19 DIAGNOSIS — R200 Anesthesia of skin: Secondary | ICD-10-CM | POA: Insufficient documentation

## 2020-08-19 DIAGNOSIS — B20 Human immunodeficiency virus [HIV] disease: Secondary | ICD-10-CM

## 2020-08-19 DIAGNOSIS — Z79899 Other long term (current) drug therapy: Secondary | ICD-10-CM

## 2020-08-19 DIAGNOSIS — F411 Generalized anxiety disorder: Secondary | ICD-10-CM

## 2020-08-19 DIAGNOSIS — F332 Major depressive disorder, recurrent severe without psychotic features: Secondary | ICD-10-CM

## 2020-08-19 MED ORDER — DULOXETINE HCL 30 MG PO CPEP: 30.0000 mg | ORAL_CAPSULE | Freq: Every day | ORAL | 2 refills | Status: DC

## 2020-08-19 MED ORDER — PRAMOXINE HCL (PERIANAL) 1 % EX FOAM: 1.0000 "application " | Freq: Three times a day (TID) | CUTANEOUS | 0 refills | Status: DC | PRN

## 2020-08-19 NOTE — Assessment & Plan Note (Signed)
Will try proctofoam.  If no improvement, GI

## 2020-08-19 NOTE — Assessment & Plan Note (Signed)
Will f/u with his ortho regarding surgery Needs stable housing. Will f/u with them for pain rx.

## 2020-08-19 NOTE — Assessment & Plan Note (Signed)
Will get him in with neuro

## 2020-08-19 NOTE — Assessment & Plan Note (Signed)
Lab Results  Component Value Date   CHOL 228 (H) 07/28/2020   HDL 41 07/28/2020   LDLCALC 148 (H) 07/28/2020   TRIG 251 (H) 07/28/2020   CHOLHDL 5.6 (H) 07/28/2020    AHA risk 2.3%. no treatment for now.

## 2020-08-19 NOTE — Assessment & Plan Note (Signed)
Well controlled on benicar.  Will continue to watch.

## 2020-08-19 NOTE — Assessment & Plan Note (Signed)
Will get him with janet Take suggestions for rx changes.
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
## 2020-08-19 NOTE — Assessment & Plan Note (Signed)
Doing well Adherence encouraged Offered/refused condoms.  Has gotten covid vax, flu vax today rtc in 9 months.  Has split from wife and needs housing.

## 2020-09-02 ENCOUNTER — Ambulatory Visit: Payer: Self-pay

## 2020-09-02 ENCOUNTER — Other Ambulatory Visit: Payer: Self-pay

## 2020-09-02 ENCOUNTER — Telehealth: Payer: Self-pay

## 2020-09-02 ENCOUNTER — Other Ambulatory Visit: Payer: Self-pay | Admitting: Infectious Diseases

## 2020-09-02 DIAGNOSIS — K219 Gastro-esophageal reflux disease without esophagitis: Secondary | ICD-10-CM

## 2020-09-02 DIAGNOSIS — F332 Major depressive disorder, recurrent severe without psychotic features: Secondary | ICD-10-CM

## 2020-09-02 MED ORDER — FLUOXETINE HCL 40 MG PO CAPS: 40.0000 mg | ORAL_CAPSULE | Freq: Every day | ORAL | 0 refills | Status: DC

## 2020-09-02 MED ORDER — OMEPRAZOLE 20 MG PO CPDR: 40.0000 mg | DELAYED_RELEASE_CAPSULE | Freq: Every day | ORAL | 0 refills | Status: DC

## 2020-09-02 NOTE — Addendum Note (Signed)
Addended by: Linna Hoff D on: 09/02/2020 02:35 PM   Modules accepted: Orders

## 2020-09-02 NOTE — Telephone Encounter (Signed)
The prozac can be changed to 40mg  po qday The cymbalta needs to be taken off the med list, it will conflict with the prozac The prilosec can be changed to 40mg  po qday  Thanks jeff

## 2020-09-02 NOTE — Telephone Encounter (Signed)
RN sent in Prozac and Prilosec per Dr. Ninetta Lights. RN took Cymbalta off of patient's medication list and spoke to [facility] to cancel the Cymbalta. RN attempted to call patient to notify him of medication changes, no answer. Patient is currently in inpatient treatment. Will send MyChart message.   Sandie Ano, RN

## 2020-09-02 NOTE — Telephone Encounter (Signed)
Patient here to see Marylu Lund and requesting to discuss his medications with a nurse. Patient requests that his dose of Prozac be increased, he was previously on 20 mg. He is also wondering if the dose of his Prilosec can be increased as he is experiencing increased heartburn at night.   Patient inquiring about neurology referral, RN advised that staff is in the process of working on referral, but that additional information will need to be sent to the neurology office. Will route to provider.   Sandie Ano, RN

## 2020-09-13 ENCOUNTER — Emergency Department (HOSPITAL_COMMUNITY): Payer: Self-pay

## 2020-09-13 ENCOUNTER — Emergency Department (HOSPITAL_BASED_OUTPATIENT_CLINIC_OR_DEPARTMENT_OTHER): Payer: Self-pay

## 2020-09-13 ENCOUNTER — Encounter (HOSPITAL_COMMUNITY): Payer: Self-pay

## 2020-09-13 ENCOUNTER — Other Ambulatory Visit: Payer: Self-pay

## 2020-09-13 ENCOUNTER — Emergency Department (HOSPITAL_COMMUNITY)
Admission: EM | Admit: 2020-09-13 | Discharge: 2020-09-13 | Disposition: A | Discharge status: Home / Self Care | Payer: Self-pay | Attending: Emergency Medicine | Admitting: Emergency Medicine

## 2020-09-13 DIAGNOSIS — M79605 Pain in left leg: Secondary | ICD-10-CM | POA: Insufficient documentation

## 2020-09-13 DIAGNOSIS — M79662 Pain in left lower leg: Secondary | ICD-10-CM

## 2020-09-13 DIAGNOSIS — Z87891 Personal history of nicotine dependence: Secondary | ICD-10-CM | POA: Insufficient documentation

## 2020-09-13 DIAGNOSIS — I10 Essential (primary) hypertension: Secondary | ICD-10-CM | POA: Insufficient documentation

## 2020-09-13 DIAGNOSIS — R079 Chest pain, unspecified: Secondary | ICD-10-CM | POA: Insufficient documentation

## 2020-09-13 DIAGNOSIS — R059 Cough, unspecified: Secondary | ICD-10-CM | POA: Insufficient documentation

## 2020-09-13 DIAGNOSIS — Z21 Asymptomatic human immunodeficiency virus [HIV] infection status: Secondary | ICD-10-CM | POA: Insufficient documentation

## 2020-09-13 DIAGNOSIS — R0602 Shortness of breath: Secondary | ICD-10-CM | POA: Insufficient documentation

## 2020-09-13 LAB — COMPREHENSIVE METABOLIC PANEL
ALT: 59 U/L — ABNORMAL HIGH (ref 0–44)
AST: 41 U/L (ref 15–41)
Albumin: 4.3 g/dL (ref 3.5–5.0)
Alkaline Phosphatase: 84 U/L (ref 38–126)
Anion gap: 9 (ref 5–15)
BUN: 13 mg/dL (ref 6–20)
CO2: 24 mmol/L (ref 22–32)
Calcium: 9.3 mg/dL (ref 8.9–10.3)
Chloride: 102 mmol/L (ref 98–111)
Creatinine, Ser: 1.04 mg/dL (ref 0.61–1.24)
GFR, Estimated: >60 mL/min (ref >60)
Glucose, Bld: 92 mg/dL (ref 70–99)
Potassium: 4.5 mmol/L (ref 3.5–5.1)
Sodium: 135 mmol/L (ref 135–145)
Total Bilirubin: 0.7 mg/dL (ref 0.3–1.2)
Total Protein: 7.7 g/dL (ref 6.5–8.1)

## 2020-09-13 LAB — URINALYSIS, ROUTINE W REFLEX MICROSCOPIC
Bilirubin Urine: NEGATIVE
Glucose, UA: NEGATIVE mg/dL
Hgb urine dipstick: NEGATIVE
Ketones, ur: NEGATIVE mg/dL
Leukocytes,Ua: NEGATIVE
Nitrite: NEGATIVE
Protein, ur: NEGATIVE mg/dL
Specific Gravity, Urine: 1.024 (ref 1.005–1.030)
pH: 5 (ref 5.0–8.0)

## 2020-09-13 LAB — CBC WITH DIFFERENTIAL/PLATELET
Abs Immature Granulocytes: 0.08 10*3/uL — ABNORMAL HIGH (ref 0.00–0.07)
Basophils Absolute: 0.1 10*3/uL (ref 0.0–0.1)
Basophils Relative: 1 %
Eosinophils Absolute: 0.4 10*3/uL (ref 0.0–0.5)
Eosinophils Relative: 4 %
HCT: 45 % (ref 39.0–52.0)
Hemoglobin: 15.2 g/dL (ref 13.0–17.0)
Immature Granulocytes: 1 %
Lymphocytes Relative: 44 %
Lymphs Abs: 3.6 10*3/uL (ref 0.7–4.0)
MCH: 30.3 pg (ref 26.0–34.0)
MCHC: 33.8 g/dL (ref 30.0–36.0)
MCV: 89.8 fL (ref 80.0–100.0)
Monocytes Absolute: 0.9 10*3/uL (ref 0.1–1.0)
Monocytes Relative: 11 %
Neutro Abs: 3.2 10*3/uL (ref 1.7–7.7)
Neutrophils Relative %: 39 %
Platelets: 269 10*3/uL (ref 150–400)
RBC: 5.01 MIL/uL (ref 4.22–5.81)
RDW: 13.6 % (ref 11.5–15.5)
WBC: 8.1 10*3/uL (ref 4.0–10.5)
nRBC: 0 % (ref 0.0–0.2)

## 2020-09-13 LAB — TROPONIN I (HIGH SENSITIVITY)
Troponin I (High Sensitivity): 4 ng/L (ref <18)
Troponin I (High Sensitivity): 3 ng/L (ref <18)

## 2020-09-13 LAB — D-DIMER, QUANTITATIVE: D-Dimer, Quant: <0.27 ug/mL-FEU (ref 0.00–0.50)

## 2020-09-13 MED ORDER — OXYCODONE-ACETAMINOPHEN 5-325 MG PO TABS
1.0000 | ORAL_TABLET | Freq: Once | ORAL | Status: DC
  Filled 2020-09-13: qty 1

## 2020-09-13 MED ORDER — KETOROLAC TROMETHAMINE 15 MG/ML IJ SOLN
15.0000 mg | Freq: Once | INTRAMUSCULAR | Status: AC
  Administered 2020-09-13: 15 mg via INTRAVENOUS
  Filled 2020-09-13: qty 1

## 2020-09-13 NOTE — ED Provider Notes (Signed)
[facility]-EMERGENCY DEPT Provider Note   CSN: [id number] Arrival date & time: 09/13/20  1700     History Chief Complaint  Patient presents with  . Shortness of Breath  . Back Pain    PEARLEY BARANEK is a 40 y.o. male.  No reports that many years ago he was diagnosed with a DVT and PE, no longer on anticoagulation.  States that he had a pain on the right side of his chest and felt slightly short of breath.  States this was reminiscent of his PE pain.  Also has been having a mild cough, nonproductive over the past couple weeks.  Currently denies any pain.  Also had slight pain in his left leg.  No pain at present.  HPI     Past Medical History:  Diagnosis Date  . Anxiety   . Depression   . DVT (deep venous thrombosis) (HCC)    occurred during hospitalization for overdose  . GERD (gastroesophageal reflux disease)   . Hiatal hernia   . HIV (human immunodeficiency virus infection) (HCC)   . Hypercholesteremia   . Hypertension   . Neuromuscular disorder (HCC)    fibromyalgia  . Pulmonary embolism (HCC)    2012    Patient Active Problem List   Diagnosis Date Noted  . Labral tear of shoulder 08/19/2020  . Numbness and tingling of leg 08/19/2020  . Hemorrhoids 08/19/2020  . Hypertension 04/03/2020  . 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 Generalized anxiety disorder 01/30/2020  . Flank pain 01/10/2020  . Healthcare maintenance 01/10/2020  . Fibromyalgia 01/10/2020  . Hyperlipemia 01/10/2020  . GERD (gastroesophageal reflux disease) 12/18/2019  . HIV disease (HCC)   . Opioid dependence with opioid-induced mood disorder (HCC)   . 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 I & D EXTREMITY Left 07/16/2020   Procedure: IRRIGATION AND DEBRIDEMENT FOREARM;  Surgeon: Bradly Bienenstock, MD;  Location: [facility] OR;  Service: Orthopedics;  Laterality: Left;  . NO PAST  SURGERIES         History reviewed. No pertinent family history.  Social History   Tobacco Use  . Smoking status: Former Smoker    Types: E-cigarettes  . Smokeless tobacco: Former Neurosurgeon    Types: Snuff  Vaping Use  . Vaping Use: Never used  Substance Use Topics  . Alcohol use: Not Currently    Comment: quit 06/2020    Home Medications Prior to Admission medications   Medication Sig Start Date End Date Taking? Authorizing Provider  BENICAR 20 MG tablet TAKE 1 TABLET(20 MG) BY MOUTH DAILY Patient taking differently: Take 20 mg by mouth daily. 05/29/20   Evlyn Kanner, MD  BIKTARVY 50-200-25 MG TABS tablet TAKE 1 TABLET BY MOUTH DAILY Patient taking differently: Take 1 tablet by mouth daily. 07/01/20   Ginnie Smart, MD  FLUoxetine (PROZAC) 40 MG capsule Take 1 capsule (40 mg total) by mouth daily. 09/02/20   Ginnie Smart, MD  ibuprofen (ADVIL) 200 MG tablet Take 400 mg by mouth every 6 (six) hours as needed for headache or moderate pain.    [provider]  omeprazole (PRILOSEC) 20 MG capsule Take 2 capsules (40 mg total) by mouth daily. 09/02/20   Ginnie Smart, MD  oxyCODONE-acetaminophen (PERCOCET) 5-325 MG tablet Take 1 tablet by mouth every 4 (four) hours as needed for severe pain. Patient not taking: Reported on 08/19/2020 07/16/20 07/16/21  Bradly Bienenstock, MD  pramoxine (PROCTOFOAM) 1 % foam Place 1 application rectally 3 (three) times daily as needed for anal itching. 08/19/20   Ginnie Smart, MD    Allergies    Patient has no known allergies.  Review of Systems   Review of Systems  Constitutional: Negative for chills and fever.  HENT: Negative for ear pain and sore throat.   Eyes: Negative for pain and visual disturbance.  Respiratory: Negative for cough and shortness of breath.   Cardiovascular: Positive for chest pain. Negative for palpitations.  Gastrointestinal: Negative for abdominal pain and vomiting.  Genitourinary: Negative for dysuria  and hematuria.  Musculoskeletal: Negative for arthralgias and back pain.  Skin: Negative for color change and rash.  Neurological: Negative for seizures and syncope.  All other systems reviewed and are negative.   Physical Exam Updated Vital Signs BP 125/81   Pulse 71   Temp 97.9 F (36.6 C) (Oral)   Resp 17   SpO2 97%   Physical Exam Vitals and nursing note reviewed.  Constitutional:      Appearance: He is well-developed.  HENT:     Head: Normocephalic and atraumatic.  Eyes:     Conjunctiva/sclera: Conjunctivae normal.  Cardiovascular:     Rate and Rhythm: Normal rate and regular rhythm.     Heart sounds: No murmur heard.   Pulmonary:     Effort: Pulmonary effort is normal. No respiratory distress.     Breath sounds: Normal breath sounds.  Abdominal:     Palpations: Abdomen is soft.     Tenderness: There is no abdominal tenderness.  Musculoskeletal:     Cervical back: Neck supple.     Comments: Left lower extremity: Normal leg, no tenderness, normal DP/PT pulses  Skin:    General: Skin is warm and dry.  Neurological:     General: No focal deficit present.     Mental Status: He is alert.  Psychiatric:        Mood and Affect: Mood normal.     ED Results / Procedures / Treatments   Labs (all labs ordered are listed, but only abnormal results are displayed) Labs Reviewed  COMPREHENSIVE METABOLIC PANEL - Abnormal; Notable for the following components:      Result Value   ALT 59 (*)    All other components within normal limits  CBC WITH DIFFERENTIAL/PLATELET - Abnormal; Notable for the following components:   Abs Immature Granulocytes 0.08 (*)    All other components within normal limits  URINALYSIS, ROUTINE W REFLEX MICROSCOPIC  D-DIMER, QUANTITATIVE  TROPONIN I (HIGH SENSITIVITY)  TROPONIN I (HIGH SENSITIVITY)    EKG EKG Interpretation  Date/Time:  Saturday September 13 2020 17:14:07 EDT Ventricular Rate:  85 PR Interval:  179 QRS Duration: 106 QT  Interval:  378 QTC Calculation: 450 R Axis:   -48 Text Interpretation: Sinus rhythm LAD, consider left anterior fascicular block Abnormal R-wave progression, late transition 12 Lead; Mason-Likar Confirmed by Marianna Fuss ([id number]) on 09/13/2020 8:55:47 PM   Radiology DG Chest 2 View  Result Date: 09/13/2020 CLINICAL DATA:  Shortness of breath. EXAM: CHEST - 2 VIEW COMPARISON:  02/26/2019 FINDINGS: The heart size and mediastinal contours are within normal limits. Both lungs are clear. No visible pleural effusions or pneumothorax. No acute osseous abnormality. IMPRESSION: No active cardiopulmonary disease. Electronically Signed   By: Feliberto Harts MD   On: 09/13/2020 18:20   VAS [place] LOWER EXTREMITY VENOUS (DVT) (ONLY [facility] & [facility])  Result Date:  09/14/2020  Lower Venous DVT Study Indications: Patient felt "pop" in calf nine days ago when starting a run. Now absents with ankle swelling and bruising.  Limitations: Patient in stretcher chair. Unable to recline fully, impacting hemodynamics. Comparison Study: No prior study on file Performing Technologist: Sherren Kerns RVS  Examination Guidelines: A complete evaluation includes B-mode imaging, spectral Doppler, color Doppler, and power Doppler as needed of all accessible portions of each vessel. Bilateral testing is considered an integral part of a complete examination. Limited examinations for reoccurring indications may be performed as noted. The reflux portion of the exam is performed with the patient in reverse Trendelenburg.  Right Technical Findings: Right leg not evaluated.  +---------+---------------+---------+-----------+----------+--------------+ LEFT     CompressibilityPhasicitySpontaneityPropertiesThrombus Aging +---------+---------------+---------+-----------+----------+--------------+ CFV      Full           Yes      Yes                                 +---------+---------------+---------+-----------+----------+--------------+ SFJ       Full                                                        +---------+---------------+---------+-----------+----------+--------------+ FV Prox  Full                                                        +---------+---------------+---------+-----------+----------+--------------+ FV Mid   Full                                                        +---------+---------------+---------+-----------+----------+--------------+ FV DistalFull                                                        +---------+---------------+---------+-----------+----------+--------------+ PFV      Full                                                        +---------+---------------+---------+-----------+----------+--------------+ POP      Full           Yes      Yes                                 +---------+---------------+---------+-----------+----------+--------------+ PTV      Full                                                        +---------+---------------+---------+-----------+----------+--------------+  PERO     Full                                                        +---------+---------------+---------+-----------+----------+--------------+ Gastroc  Full                                                        +---------+---------------+---------+-----------+----------+--------------+     Summary: LEFT: - There is no evidence of deep vein thrombosis in the lower extremity.  - Area of tissue disturbance noted in mid to distal calf at site of "pop." Cannot rule out subacute muscle tear.  *See table(s) above for measurements and observations. Electronically signed by Sherald Hesshristopher Clark MD on 09/14/2020 at 11:42:01 AM.    Final     Procedures Procedures   Medications Ordered in ED Medications  ketorolac (TORADOL) 15 MG/ML injection 15 mg (15 mg Intravenous Given 09/13/20 2048)    ED Course  I have reviewed the triage vital signs and the nursing  notes.  Pertinent labs & imaging results that were available during my care of the patient were reviewed by me and considered in my medical decision making (see chart for details).    MDM Rules/Calculators/A&P                         40 year old male presenting to ER with right-sided chest pain.  Has notable history for DVT/PE.  Ultrasound was negative.  His D-dimer is undetectable, doubt PE.  Basic labs are stable.  EKG without acute ischemic change and troponin within normal limits, doubt ACS.  Patient has no ongoing symptoms, believe he is stable for discharge and outpatient management.   After the discussed management above, the patient was determined to be safe for discharge.  The patient was in agreement with this plan and all questions regarding their care were answered.  ED return precautions were discussed and the patient will return to the ED with any significant worsening of condition.   Final Clinical Impression(s) / ED Diagnoses Final diagnoses:  Chest pain, unspecified type    Rx / DC Orders ED Discharge Orders    None       Milagros Lollykstra, Callen Zuba S, MD 09/14/20 1434

## 2020-09-13 NOTE — ED Triage Notes (Signed)
Emergency Medicine Provider Triage Evaluation Note  Jimmy Fisher , a 40 y.o. male  was evaluated in triage.  Pt complains of patient presents with right-sided flank pain with shortness of breath.  He does endorse this started suddenly yesterday, he has pain in his right flank worsened with sitting versus standing, has consistent shortness of breath, but denies chest pain, becoming diaphoretic, nausea, vomiting, worsening pedal edema.  Patient has a history of DVTs and PEs, endorses couple days prior he felt a pop in his left calf, and noted today that he has bruising around his left leg.  He has continued to have pain in his left calf, denies unilateral leg swelling.  Currently not on anticoags.  Patient does endorse that he has history of kidney stones, endorses frequent urination, denies hematuria or dysuria, has no history of liver or gallbladder normalities.   Review of Systems  Positive: Shortness of breath, right-sided flank pain, dysuria, left calf pain and ecchymosis of the left leg. Negative: Denies headaches, fevers, chills, chest pain, abdominal pain, nausea, vomiting, diarrhea, worsening pedal edema.  Physical Exam  BP (!) 149/91 (BP Location: Left Arm)   Pulse 85   Temp 97.9 F (36.6 C) (Oral)   Resp 18   SpO2 97%  Gen:   Awake, no distress  HEENT:  Atraumatic  Resp:  Normal effort  Cardiac:  Normal rate  Abd:   Abdomen is tender to palpation in the right upper quadrant, negative Murphy sign, positive right CVA tenderness, no peritoneal sign or rebound tenderness. MSK:   Moves extremities without difficulty patient is noted ecchymosis on the left lower extremity the medial aspect of the tibia, 1+ pedal edema, with calf tenderness. Neuro:  Speech clear   Medical Decision Making  Medically screening exam initiated at 5:53 PM.  Appropriate orders placed.  Jimmy Fisher was informed that the remainder of the evaluation will be completed by another provider, this initial  triage assessment does not replace that evaluation, and the importance of remaining in the ED until their evaluation is complete.  Clinical Impression  Patient presents with right-sided flank pain, shortness of breath, and left calf pain.  Concern for DVT, PE, kidney stone will obtain basic lab work-up, chest x-ray, DVT study patient will need further evaluation.   Jimmy Sage, PA-C 09/13/20 1758

## 2020-09-13 NOTE — ED Triage Notes (Signed)
Pt reports developing back pain that radiates to chest with [facility] that began yesterday. Pt reports hx of PE and states this feels the same. Pt also reports cough and sore throat x2 weeks.

## 2020-09-13 NOTE — Discharge Instructions (Addendum)
Follow-up with your primary doctor.  Take Tylenol or Motrin for pain control.  If you develop worsening pain, difficulty breathing or other new concerning symptom, return to ER for reassessment.

## 2020-09-13 NOTE — Progress Notes (Signed)
VASCULAR LAB    Left lower extremity venous duplex has been performed.  See CV proc for preliminary results.  Gave verbal report to Berle Mull, PA-C  Namiah Dunnavant, RVT 09/13/2020, 6:52 PM

## 2020-09-22 NOTE — Telephone Encounter (Signed)
Patient called to discuss medication changes. Patient will be reaching out to PCP to determine alternative to Prozac as there is an interaction with Cymbalta.   Kel Senn Loyola Mast, RN

## 2020-09-24 ENCOUNTER — Encounter: Payer: Self-pay | Admitting: Student

## 2020-09-24 ENCOUNTER — Ambulatory Visit (INDEPENDENT_AMBULATORY_CARE_PROVIDER_SITE_OTHER): Payer: Self-pay | Admitting: Student

## 2020-09-24 VITALS — BP 143/93 | HR 92 | Temp 98.0°F | Ht 66.0 in | Wt 201.4 lb

## 2020-09-24 DIAGNOSIS — R200 Anesthesia of skin: Secondary | ICD-10-CM

## 2020-09-24 DIAGNOSIS — R202 Paresthesia of skin: Secondary | ICD-10-CM

## 2020-09-24 DIAGNOSIS — I10 Essential (primary) hypertension: Secondary | ICD-10-CM

## 2020-09-24 DIAGNOSIS — F339 Major depressive disorder, recurrent, unspecified: Secondary | ICD-10-CM

## 2020-09-24 DIAGNOSIS — M24112 Other articular cartilage disorders, left shoulder: Secondary | ICD-10-CM

## 2020-09-24 DIAGNOSIS — M797 Fibromyalgia: Secondary | ICD-10-CM

## 2020-09-24 DIAGNOSIS — F418 Other specified anxiety disorders: Secondary | ICD-10-CM

## 2020-09-24 DIAGNOSIS — S43432D Superior glenoid labrum lesion of left shoulder, subsequent encounter: Secondary | ICD-10-CM

## 2020-09-24 MED ORDER — FLUOXETINE HCL 20 MG PO CAPS: 20.0000 mg | ORAL_CAPSULE | Freq: Every day | ORAL | 2 refills | Status: DC

## 2020-09-24 MED ORDER — OXYCODONE-ACETAMINOPHEN 7.5-325 MG PO TABS: 1.0000 | ORAL_TABLET | ORAL | 0 refills | Status: AC | PRN

## 2020-09-24 MED ORDER — AMITRIPTYLINE HCL 10 MG PO TABS: 5.0000 mg | ORAL_TABLET | Freq: Every day | ORAL | 2 refills | Status: DC
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
## 2020-09-24 NOTE — Patient Instructions (Signed)
Jimmy Fisher,   For your fibromyalgia, please STOP taking Lyrica (Pregabalin) and Cymbalta and instead START taking amitriptyline, 0.5 tablet at night. After two weeks, if your pain is not improved, you may increase this to 1 tablet nightly. This may also improve your numbness of your leg.  You may continue taking your fluoxetine at 20mg  daily for now.  I will prescribe a refill of this to your pharmacy.   You have an extremely low chance of developing serotonin syndrome on this regimen. However, if you develop sudden onset severe fevers, severe muscle tightness or jerking, please stop taking both of the above medications and go to the ED.   I will go ahead and prescribe a short course of prednisone for your shoulder pain.  I would highly recommend you call to schedule a follow up visit with the orthopedic surgeon as a corticosteroid injection is likely only to be a temporizing measure that may not get at the root cause of your pain and may not provide long-term relief of your pain.   I will assist you in getting a financial counseling appointment. Please expect a call back to schedule this and feel free to schedule an [facility] appointment after you get insurance or if you have any questions or concerns in the meantime.   It was a pleasure meeting you and best of luck,   Dr. [facility]    SLAP Lesions  Superior labrum anterior posterior (SLAP) lesions are injuries to part of the connective tissue (cartilage) of the shoulder joint. The top of the upper arm bone (humerus) fits into a socket in the shoulder blade to form the shoulder joint. There is a firm rim of cartilage (labrum) around the edge of the socket. The labrum helps to deepen the socket and hold the humerus in place. If a certain part of the labrum becomes frayed or torn, it is called a SLAP lesion. A SLAP lesion can cause shoulder pain, instability, and weakness. SLAP lesions are common among athletes who play sports that involve repeated  overhead movements. SLAP lesions may include a tear in the cord of tissue that attaches the muscle in the front of the upper arm to the shoulder blade (proximal biceps tendon). What are the causes? This condition may be caused by:  A sudden (acute) injury, which can result from: ? Falling on an outstretched arm. ? Movement of the shoulder joint out of its normal place (dislocation). ? A direct hit to the shoulder.  Wear and tear over time, which can result from doing activities or sports that involve overhead arm movements. What increases the risk? The following factors may make you more likely to develop this condition:  Having had a dislocated shoulder in the past.  Being age 68 or older.  Playing certain sports, such as: ? Sports that involve repeated overhead movements, such as baseball or volleyball. ? Sports that put backward pressure on the arms when the arms are overhead, such as gymnastics or basketball. ? Contact sports.  Lifting weights. What are the signs or symptoms? The main symptom of this condition is shoulder pain that gets worse when lifting a heavy object or raising the arm overhead. Other symptoms include:  Feeling like your shoulder is locking, catching, grinding, or popping.  Loss of strength.  Stiffness and limited range of motion.  Loss of throwing power. How is this diagnosed? This condition may be diagnosed based on:  Your symptoms.  Your medical history.  A physical exam.  Imaging tests, such as an MRI or a CT scan. How is this treated? Treatment for this condition may include:  Resting your shoulder by avoiding activities that cause shoulder pain.  Taking NSAIDs to help reduce pain and swelling.  Having physical therapy to improve strength and range of motion.  Having surgery. This may be done if other treatment methods do not help. Surgery may involve: ? Removing frayed pieces of the labrum. ? Repairing tears. ? Reattaching the  labrum. ? Repairing the biceps tendon. Follow these instructions at home: Managing pain, stiffness, and swelling  If directed, put ice on the injured area. ? Put ice in a plastic bag. ? Place a towel between your skin and the bag. ? Leave the ice on for 20 minutes, 2-3 times a day.  Move your fingers often to reduce stiffness and swelling.  Raise (elevate) the injured area above the level of your heart while you are sitting or lying down.   Activity  Do exercises as told by your health care provider.  Return to your normal activities as told by your health care provider. Ask your health care provider what activities are safe for you.  Ask your health care provider when it is safe for you to drive while your shoulder is healing. General instructions  Take over-the-counter and prescription medicines only as told by your health care provider.  Do not use any products that contain nicotine or tobacco, such as cigarettes, e-cigarettes, and chewing tobacco. If you need help quitting, ask your health care provider.  Keep all follow-up visits as told by your health care provider. This is important. How is this prevented?  Warm up and stretch before being active.  Cool down and stretch after being active.  Give your body time to rest between periods of activity.  Maintain physical fitness, including strength and flexibility.  Be safe and responsible while being active. This will help you to avoid falls. Contact a health care provider if:  Your symptoms have not improved after 6 months of treatment.  Your symptoms get worse instead of getting better. Summary  Superior labrum anterior posterior (SLAP) lesions are injuries to part of the connective tissue (cartilage) of the shoulder joint.  This condition is usually caused by a sudden (acute) injury due to a fall or direct hit to the shoulder. It can also be caused by wear and tear over time, which can result from doing activities  or sports that involve overhead arm movements.  It may be treated with rest, medicines, physical therapy, and surgery.  Contact a health care provider if your symptoms get worse instead of getting better. This information is not intended to replace advice given to you by your health care provider. Make sure you discuss any questions you have with your health care provider. Document Revised: 07/18/2018 Document Reviewed: 07/18/2018 Elsevier Patient Education  2021 [employer].

## 2020-09-25 NOTE — Assessment & Plan Note (Addendum)
Patient had only transient improvement in left shoulder pain following injection 08/18/20. He prefers to have repeat injection by ortho although was told this would need to wait until June. He is hesitant to pursue surgery due to anxiety regarding awakening from procedure and the process of needed rehabilitation afterwards.   - Discussed that repeat injection would likely only be a temporizing measure that would unlikely address underlying issue  - Highly encouraged him to reconsider surgery, as favored by ortho  - Referral sent to [facility] for counseling / CBT - Continue NSAIDs PRN for pain with short course of percocet 7.5-325mg  q4 hours PRN for breakthrough pain until he is able to schedule follow up appointment with orthopedics  - Continue home PT exercises although only as tolerated and instructed

## 2020-09-25 NOTE — Assessment & Plan Note (Addendum)
Blood pressure remains elevated today: BP Readings from Last 3 Encounters:  09/24/20 (!) 143/93  09/13/20 125/81  08/19/20 121/79   However, given recent normal pressures and acute pain, will hold off on changing medication at this time.   - Continue to monitor on Benicar 20mg  daily

## 2020-09-25 NOTE — Assessment & Plan Note (Signed)
Patient continues to have function-limiting symptom of hand tightness and pain at work. He currently is on pregabaline 150mg  BID without noticeable improvement, takes fluoxetine 20mg  daily, and had been prescribed Cymbalta, although stopped taking this due to concern for drug-drug interaction. Noted pain did not improve on Cymbalta or gabapentin in the past. Prefers to continue with fluoxetine given it improves his anxiety tremendously.   - Refilled fluoxetine 20mg  daily  - Start amitriptyline 5mg  nightly  - Instructed him to increase amitriptyline to 10mg  nightly if little response in symptoms to above; spoke with pharmacy who agree this would be an appropriate treatment dose with limited but existing risk of ST syndrome - Discontinue pregabaline and cymbalta  - Discussed the unlikely probability of developing ST syndrome and provided return precautions (to ED) if symptoms develop
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
## 2020-09-29 NOTE — Progress Notes (Signed)
Internal Medicine Clinic Attending  Case discussed with Dr. Speakman  At the time of the visit.  We reviewed the resident's history and exam and pertinent patient test results.  I agree with the assessment, diagnosis, and plan of care documented in the resident's note.  

## 2020-09-30 ENCOUNTER — Encounter: Payer: Self-pay | Admitting: Student

## 2020-10-03 ENCOUNTER — Telehealth: Payer: Self-pay | Admitting: *Deleted

## 2020-10-03 DIAGNOSIS — B20 Human immunodeficiency virus [HIV] disease: Secondary | ICD-10-CM

## 2020-10-03 NOTE — Telephone Encounter (Signed)
Patient called, advised he is going to an in-patient treatment center in another state. They are asking for a TB test. Patient asked if he ever had this test done here. Patient is missing some intake labs, including quantiferon gold. Patient will come 4/25 for missing intake labs. Orders placed. Andree Coss, RN

## 2020-10-06 ENCOUNTER — Other Ambulatory Visit: Payer: Self-pay

## 2020-10-06 DIAGNOSIS — Z113 Encounter for screening for infections with a predominantly sexual mode of transmission: Secondary | ICD-10-CM

## 2020-10-06 DIAGNOSIS — B20 Human immunodeficiency virus [HIV] disease: Secondary | ICD-10-CM

## 2020-10-06 DIAGNOSIS — Z79899 Other long term (current) drug therapy: Secondary | ICD-10-CM

## 2020-10-07 LAB — T-HELPER CELL (CD4) - (RCID CLINIC ONLY)
CD4 % Helper T Cell: 30 % — ABNORMAL LOW (ref 33–65)
CD4 T Cell Abs: 864 /uL (ref 400–1790)

## 2020-10-09 ENCOUNTER — Ambulatory Visit (INDEPENDENT_AMBULATORY_CARE_PROVIDER_SITE_OTHER): Payer: Self-pay | Admitting: Orthopaedic Surgery

## 2020-10-09 ENCOUNTER — Encounter: Payer: Self-pay | Admitting: Orthopaedic Surgery

## 2020-10-09 ENCOUNTER — Other Ambulatory Visit: Payer: Self-pay

## 2020-10-09 DIAGNOSIS — S43432D Superior glenoid labrum lesion of left shoulder, subsequent encounter: Secondary | ICD-10-CM

## 2020-10-09 LAB — COMPREHENSIVE METABOLIC PANEL
AG Ratio: 1.8 (calc) (ref 1.0–2.5)
ALT: 34 U/L (ref 9–46)
AST: 24 U/L (ref 10–40)
Albumin: 4.3 g/dL (ref 3.6–5.1)
Alkaline phosphatase (APISO): 71 U/L (ref 36–130)
BUN: 14 mg/dL (ref 7–25)
CO2: 28 mmol/L (ref 20–32)
Calcium: 9.4 mg/dL (ref 8.6–10.3)
Chloride: 104 mmol/L (ref 98–110)
Creat: 1.01 mg/dL (ref 0.60–1.35)
Globulin: 2.4 g/dL (calc) (ref 1.9–3.7)
Glucose, Bld: 102 mg/dL — ABNORMAL HIGH (ref 65–99)
Potassium: 4.4 mmol/L (ref 3.5–5.3)
Sodium: 138 mmol/L (ref 135–146)
Total Bilirubin: 0.4 mg/dL (ref 0.2–1.2)
Total Protein: 6.7 g/dL (ref 6.1–8.1)

## 2020-10-09 LAB — CBC
HCT: 42.7 % (ref 38.5–50.0)
Hemoglobin: 14.3 g/dL (ref 13.2–17.1)
MCH: 29.9 pg (ref 27.0–33.0)
MCHC: 33.5 g/dL (ref 32.0–36.0)
MCV: 89.1 fL (ref 80.0–100.0)
MPV: 8.7 fL (ref 7.5–12.5)
Platelets: 251 10*3/uL (ref 140–400)
RBC: 4.79 10*6/uL (ref 4.20–5.80)
RDW: 14.5 % (ref 11.0–15.0)
WBC: 5.9 10*3/uL (ref 3.8–10.8)

## 2020-10-09 LAB — QUANTIFERON-TB GOLD PLUS
Mitogen-NIL: >10 IU/mL
NIL: 0.16 IU/mL
QuantiFERON-TB Gold Plus: NEGATIVE
TB1-NIL: 0.07 IU/mL
TB2-NIL: 0.03 IU/mL

## 2020-10-09 LAB — LIPID PANEL
Cholesterol: 235 mg/dL — ABNORMAL HIGH (ref <200)
HDL: 34 mg/dL — ABNORMAL LOW (ref >=40)
LDL Cholesterol (Calc): 143 mg/dL (calc) — ABNORMAL HIGH
Non-HDL Cholesterol (Calc): 201 mg/dL (calc) — ABNORMAL HIGH (ref <130)
Total CHOL/HDL Ratio: 6.9 (calc) — ABNORMAL HIGH (ref <5.0)
Triglycerides: 374 mg/dL — ABNORMAL HIGH (ref <150)

## 2020-10-09 LAB — HLA B*5701: HLA-B*5701 w/rflx HLA-B High: NEGATIVE

## 2020-10-09 LAB — HIV-1 RNA QUANT-NO REFLEX-BLD
HIV 1 RNA Quant: 31 Copies/mL — ABNORMAL HIGH
HIV-1 RNA Quant, Log: 1.49 Log cps/mL — ABNORMAL HIGH

## 2020-10-09 LAB — HEPATITIS B CORE ANTIBODY, TOTAL: Hep B Core Total Ab: NONREACTIVE

## 2020-10-09 LAB — RPR: RPR Ser Ql: NONREACTIVE

## 2020-10-09 MED ORDER — TRAMADOL HCL 50 MG PO TABS: 50.0000 mg | ORAL_TABLET | Freq: Three times a day (TID) | ORAL | 0 refills | Status: DC | PRN

## 2020-10-09 NOTE — Progress Notes (Signed)
Office Visit Note   Patient: Jimmy Fisher           Date of Birth: February 14, 1981           MRN: [id number] Visit Date: 10/09/2020              Requested by: Evlyn Kanner, MD [address] [place],  [place] [address] PCP: Evlyn Kanner, MD   Assessment & Plan: Visit Diagnoses:  1. Tear of left glenoid labrum, subsequent encounter     Plan: I reviewed the MRI from [facility] on a separate CD and the radiologist report which states a left shoulder tear of the superior posterior and inferior labrum.  He does also have a 4 mm inferior labral cyst and mild degenerative changes to the posterior glenoid.  Mild tendinitis of the rotator cuff and biceps tendon.  These findings were reviewed with the patient and given the lack of relief from conservative treatment, the patient would like to proceed with surgical intervention.  We talked about biceps tenodesis vs. Tenotomy and the pros and cons of each technique including risk for popeye deformity, cramping pain, shoulder function.  We will plan to perform arthroscopic biceps tenodesis.  Risks, benefits and poss complications reviewed.  Rehab recovery time discussed.  All questions were answered.  Total face to face encounter time was greater than 25 minutes and over half of this time was spent in counseling and/or coordination of care.  Follow-Up Instructions: Return for post-op.   Orders:  No orders of the defined types were placed in this encounter.  No orders of the defined types were placed in this encounter.     Procedures: No procedures performed   Clinical Data: No additional findings.   Subjective: Chief Complaint  Patient presents with  . Left Shoulder - Pain    HPI patient is a pleasant 40 year old gentleman who comes in today to discuss surgery for the left shoulder.  He was initially scheduled for left shoulder surgery by Dr. Devonne Doughty for a SLAP tear but then sustained lacerations to the left upper extremity and  this was canceled.  We saw him soon after for a second opinion..  We sent him to Dr. Prince Rome for ultrasound-guided glenohumeral cortisone injection and physical therapy.  He notes that he had a few weeks relief.  His pain has returned and his worsened and is to the entire shoulder with any activity.  Review of Systems as detailed in HPI.  All others reviewed and are negative.   Objective: Vital Signs: There were no vitals taken for this visit.  Physical Exam well-developed and well-nourished gentleman in no acute distress.  Alert and oriented x3.  Ortho Exam left shoulder exam reveals approximately 140 degrees of forward flexion.  He can externally rotate to almost 90 degrees.  Internal rotation to L5.  Positive O'Brien.  He is neurovascular intact distally.  Specialty Comments:  No specialty comments available.  Imaging: No new imaging   PMFS History: Patient Active Problem List   Diagnosis Date Noted  . Labral tear of shoulder 08/19/2020  . Numbness and tingling of leg 08/19/2020  . Hemorrhoids 08/19/2020  . Hypertension 04/03/2020  . 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 Generalized anxiety disorder 01/30/2020  . Flank pain 01/10/2020  . Healthcare maintenance 01/10/2020  . Fibromyalgia 01/10/2020  . Hyperlipemia 01/10/2020  . GERD (gastroesophageal reflux disease) 12/18/2019  . HIV disease (HCC)   . Opioid dependence with opioid-induced mood disorder (HCC)   . Major  depressive disorder, 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/2019   Past Medical History:  Diagnosis Date  . Anxiety   . Depression   . DVT (deep venous thrombosis) (HCC)    occurred during hospitalization for overdose  . GERD (gastroesophageal reflux disease)   . Hiatal hernia   . HIV (human immunodeficiency virus infection) (HCC)   . Hypercholesteremia   . Hypertension   . Neuromuscular disorder (HCC)    fibromyalgia  . Pulmonary embolism (HCC)    2012    History reviewed. No pertinent  family history.  Past Surgical History:  Procedure Laterality Date  . I & D EXTREMITY Left 07/16/2020   Procedure: IRRIGATION AND DEBRIDEMENT FOREARM;  Surgeon: Bradly Bienenstock, MD;  Location: [facility] OR;  Service: Orthopedics;  Laterality: Left;  . NO PAST SURGERIES     Social History   Occupational History  . Not on file  Tobacco Use  . Smoking status: Former Smoker    Types: E-cigarettes  . Smokeless tobacco: Former Neurosurgeon    Types: Snuff  Vaping Use  . Vaping Use: Never used  Substance and Sexual Activity  . Alcohol use: Not Currently    Comment: quit 06/2020  . Drug use: Not on file    Comment: last used one week ago  . Sexual activity: Not on file

## 2020-10-12 ENCOUNTER — Encounter: Payer: Self-pay | Admitting: Orthopaedic Surgery

## 2020-10-15 ENCOUNTER — Other Ambulatory Visit: Payer: Self-pay

## 2020-10-15 ENCOUNTER — Encounter (HOSPITAL_BASED_OUTPATIENT_CLINIC_OR_DEPARTMENT_OTHER): Payer: Self-pay | Admitting: Orthopaedic Surgery

## 2020-10-16 ENCOUNTER — Ambulatory Visit: Payer: Self-pay

## 2020-10-20 ENCOUNTER — Other Ambulatory Visit (HOSPITAL_COMMUNITY)
Admission: RE | Admit: 2020-10-20 | Discharge: 2020-10-20 | Disposition: A | Discharge status: Home / Self Care | Payer: Self-pay | Source: Ambulatory Visit | Attending: Orthopaedic Surgery | Admitting: Orthopaedic Surgery

## 2020-10-20 DIAGNOSIS — Z01812 Encounter for preprocedural laboratory examination: Secondary | ICD-10-CM | POA: Insufficient documentation

## 2020-10-20 DIAGNOSIS — Z20822 Contact with and (suspected) exposure to covid-19: Secondary | ICD-10-CM | POA: Insufficient documentation

## 2020-10-21 LAB — SARS CORONAVIRUS 2 (TAT 6-24 HRS): SARS Coronavirus 2: NEGATIVE

## 2020-10-22 ENCOUNTER — Ambulatory Visit (HOSPITAL_BASED_OUTPATIENT_CLINIC_OR_DEPARTMENT_OTHER): Payer: Self-pay | Admitting: Anesthesiology

## 2020-10-22 ENCOUNTER — Encounter (HOSPITAL_BASED_OUTPATIENT_CLINIC_OR_DEPARTMENT_OTHER)
Admission: RE | Disposition: A | Discharge status: Home / Self Care | Payer: Self-pay | Source: Home / Self Care | Attending: Orthopaedic Surgery

## 2020-10-22 ENCOUNTER — Ambulatory Visit (HOSPITAL_BASED_OUTPATIENT_CLINIC_OR_DEPARTMENT_OTHER)
Admission: RE | Admit: 2020-10-22 | Discharge: 2020-10-22 | Disposition: A | Discharge status: Home / Self Care | Payer: Self-pay | Attending: Orthopaedic Surgery | Admitting: Orthopaedic Surgery

## 2020-10-22 ENCOUNTER — Encounter (HOSPITAL_BASED_OUTPATIENT_CLINIC_OR_DEPARTMENT_OTHER): Payer: Self-pay | Admitting: Orthopaedic Surgery

## 2020-10-22 ENCOUNTER — Other Ambulatory Visit: Payer: Self-pay

## 2020-10-22 ENCOUNTER — Encounter: Payer: Self-pay | Admitting: Orthopaedic Surgery

## 2020-10-22 DIAGNOSIS — M659 Synovitis and tenosynovitis, unspecified: Secondary | ICD-10-CM | POA: Insufficient documentation

## 2020-10-22 DIAGNOSIS — M7552 Bursitis of left shoulder: Secondary | ICD-10-CM

## 2020-10-22 DIAGNOSIS — X58XXXA Exposure to other specified factors, initial encounter: Secondary | ICD-10-CM | POA: Insufficient documentation

## 2020-10-22 DIAGNOSIS — F17290 Nicotine dependence, other tobacco product, uncomplicated: Secondary | ICD-10-CM | POA: Insufficient documentation

## 2020-10-22 DIAGNOSIS — S43432A Superior glenoid labrum lesion of left shoulder, initial encounter: Secondary | ICD-10-CM

## 2020-10-22 DIAGNOSIS — M7542 Impingement syndrome of left shoulder: Secondary | ICD-10-CM | POA: Insufficient documentation

## 2020-10-22 DIAGNOSIS — Z79899 Other long term (current) drug therapy: Secondary | ICD-10-CM | POA: Insufficient documentation

## 2020-10-22 DIAGNOSIS — M94212 Chondromalacia, left shoulder: Secondary | ICD-10-CM | POA: Insufficient documentation

## 2020-10-22 HISTORY — PX: SUBACROMIAL DECOMPRESSION: SHX5174

## 2020-10-22 HISTORY — PX: SHOULDER ARTHROSCOPY WITH BICEPS TENDON REPAIR: SHX5674

## 2020-10-22 HISTORY — DX: Superior glenoid labrum lesion of unspecified shoulder, initial encounter: S43.439A

## 2020-10-22 SURGERY — SHOULDER ARTHROSCOPY WITH BICEPS TENDON REPAIR
Anesthesia: Regional | Site: Shoulder | Laterality: Left

## 2020-10-22 MED ORDER — LIDOCAINE 2% (20 MG/ML) 5 ML SYRINGE
INTRAMUSCULAR | Status: DC | PRN
  Administered 2020-10-22: 20 mg via INTRAVENOUS

## 2020-10-22 MED ORDER — BUPIVACAINE HCL (PF) 0.5 % IJ SOLN
INTRAMUSCULAR | Status: DC | PRN
  Administered 2020-10-22: 15 mL via PERINEURAL

## 2020-10-22 MED ORDER — FENTANYL CITRATE (PF) 100 MCG/2ML IJ SOLN: 25.0000 ug | INTRAMUSCULAR | Status: DC | PRN

## 2020-10-22 MED ORDER — PROPOFOL 10 MG/ML IV BOLUS
INTRAVENOUS | Status: DC | PRN
  Administered 2020-10-22: 150 mg via INTRAVENOUS

## 2020-10-22 MED ORDER — FENTANYL CITRATE (PF) 100 MCG/2ML IJ SOLN
100.0000 ug | Freq: Once | INTRAMUSCULAR | Status: AC
  Administered 2020-10-22: 100 ug via INTRAVENOUS

## 2020-10-22 MED ORDER — BUPIVACAINE-EPINEPHRINE (PF) 0.25% -1:200000 IJ SOLN
INTRAMUSCULAR | Status: DC | PRN
  Administered 2020-10-22: 20 mL

## 2020-10-22 MED ORDER — EPHEDRINE SULFATE-NACL 50-0.9 MG/10ML-% IV SOSY
PREFILLED_SYRINGE | INTRAVENOUS | Status: DC | PRN
  Administered 2020-10-22 (×2): 10 mg via INTRAVENOUS
  Administered 2020-10-22: 15 mg via INTRAVENOUS
  Administered 2020-10-22: 5 mg via INTRAVENOUS

## 2020-10-22 MED ORDER — LIDOCAINE 2% (20 MG/ML) 5 ML SYRINGE
INTRAMUSCULAR | Status: AC
  Filled 2020-10-22: qty 5

## 2020-10-22 MED ORDER — CEFAZOLIN SODIUM-DEXTROSE 2-4 GM/100ML-% IV SOLN
2.0000 g | INTRAVENOUS | Status: AC
Start: 2020-10-22 — End: 2020-10-22
  Administered 2020-10-22: 2 g via INTRAVENOUS

## 2020-10-22 MED ORDER — SODIUM CHLORIDE 0.9 % IR SOLN
Status: DC | PRN
  Administered 2020-10-22: 9000 mL

## 2020-10-22 MED ORDER — LACTATED RINGERS IV SOLN: INTRAVENOUS | Status: DC

## 2020-10-22 MED ORDER — MIDAZOLAM HCL 2 MG/2ML IJ SOLN
INTRAMUSCULAR | Status: AC
  Filled 2020-10-22: qty 2

## 2020-10-22 MED ORDER — BUPIVACAINE-EPINEPHRINE (PF) 0.25% -1:200000 IJ SOLN
INTRAMUSCULAR | Status: AC
  Filled 2020-10-22: qty 30

## 2020-10-22 MED ORDER — POVIDONE-IODINE 10 % OINT PACKET
TOPICAL_OINTMENT | Freq: Once | CUTANEOUS | Status: AC
  Filled 2020-10-22: qty 1

## 2020-10-22 MED ORDER — LACTATED RINGERS IV SOLN: INTRAVENOUS | Status: DC | PRN

## 2020-10-22 MED ORDER — FENTANYL CITRATE (PF) 100 MCG/2ML IJ SOLN
INTRAMUSCULAR | Status: AC
  Filled 2020-10-22: qty 2

## 2020-10-22 MED ORDER — OXYCODONE HCL 5 MG PO TABS
ORAL_TABLET | ORAL | Status: AC
  Filled 2020-10-22: qty 1

## 2020-10-22 MED ORDER — BUPIVACAINE LIPOSOME 1.3 % IJ SUSP
INTRAMUSCULAR | Status: DC | PRN
  Administered 2020-10-22: 10 mL via PERINEURAL

## 2020-10-22 MED ORDER — MIDAZOLAM HCL 2 MG/2ML IJ SOLN
2.0000 mg | Freq: Once | INTRAMUSCULAR | Status: AC
  Administered 2020-10-22: 2 mg via INTRAVENOUS

## 2020-10-22 MED ORDER — DEXAMETHASONE SODIUM PHOSPHATE 10 MG/ML IJ SOLN
INTRAMUSCULAR | Status: AC
  Filled 2020-10-22: qty 2

## 2020-10-22 MED ORDER — ACETAMINOPHEN 500 MG PO TABS
ORAL_TABLET | ORAL | Status: AC
  Filled 2020-10-22: qty 2

## 2020-10-22 MED ORDER — CEFAZOLIN SODIUM-DEXTROSE 2-4 GM/100ML-% IV SOLN
INTRAVENOUS | Status: AC
  Filled 2020-10-22: qty 100

## 2020-10-22 MED ORDER — ACETAMINOPHEN 500 MG PO TABS
1000.0000 mg | ORAL_TABLET | Freq: Once | ORAL | Status: AC
  Administered 2020-10-22: 1000 mg via ORAL

## 2020-10-22 MED ORDER — PHENYLEPHRINE 40 MCG/ML (10ML) SYRINGE FOR IV PUSH (FOR BLOOD PRESSURE SUPPORT)
PREFILLED_SYRINGE | INTRAVENOUS | Status: DC | PRN
  Administered 2020-10-22 (×4): 80 ug via INTRAVENOUS

## 2020-10-22 MED ORDER — ONDANSETRON HCL 4 MG/2ML IJ SOLN
INTRAMUSCULAR | Status: DC | PRN
  Administered 2020-10-22: 4 mg via INTRAVENOUS

## 2020-10-22 MED ORDER — ROCURONIUM BROMIDE 100 MG/10ML IV SOLN
INTRAVENOUS | Status: DC | PRN
  Administered 2020-10-22: 80 mg via INTRAVENOUS

## 2020-10-22 MED ORDER — SUGAMMADEX SODIUM 200 MG/2ML IV SOLN
INTRAVENOUS | Status: DC | PRN
  Administered 2020-10-22: 200 mg via INTRAVENOUS

## 2020-10-22 MED ORDER — OXYCODONE-ACETAMINOPHEN 5-325 MG PO TABS: 1.0000 | ORAL_TABLET | Freq: Three times a day (TID) | ORAL | 0 refills | Status: DC | PRN

## 2020-10-22 MED ORDER — DEXAMETHASONE SODIUM PHOSPHATE 10 MG/ML IJ SOLN
INTRAMUSCULAR | Status: DC | PRN
  Administered 2020-10-22: 5 mg via INTRAVENOUS

## 2020-10-22 MED ORDER — ONDANSETRON HCL 4 MG PO TABS: 4.0000 mg | ORAL_TABLET | Freq: Three times a day (TID) | ORAL | 0 refills | Status: DC | PRN

## 2020-10-22 MED ORDER — FENTANYL CITRATE (PF) 100 MCG/2ML IJ SOLN
INTRAMUSCULAR | Status: DC | PRN
  Administered 2020-10-22 (×2): 50 ug via INTRAVENOUS

## 2020-10-22 MED ORDER — OXYCODONE HCL 5 MG PO TABS
5.0000 mg | ORAL_TABLET | Freq: Once | ORAL | Status: AC | PRN
  Administered 2020-10-22: 5 mg via ORAL

## 2020-10-22 MED ORDER — PROPOFOL 10 MG/ML IV BOLUS
INTRAVENOUS | Status: AC
  Filled 2020-10-22: qty 20

## 2020-10-22 SURGICAL SUPPLY — 76 items
ADH SKN CLS APL DERMABOND .7 (GAUZE/BANDAGES/DRESSINGS)
ANCH SUT SWLK 24.5 SLF PNCH VT (Anchor) ×1 IMPLANT
ANCHOR BIOCOMP SWIVELOCK (Anchor) ×1 IMPLANT
APL SKNCLS STERI-STRIP NONHPOA (GAUZE/BANDAGES/DRESSINGS)
BENZOIN TINCTURE PRP APPL 2/3 (GAUZE/BANDAGES/DRESSINGS) IMPLANT
BLADE SURG 15 STRL LF DISP TIS (BLADE) IMPLANT
BLADE SURG 15 STRL SS (BLADE)
BURR OVAL 8 FLU 4.0X13 (MISCELLANEOUS) ×2 IMPLANT
CANNULA 5.75X71 LONG (CANNULA) ×2 IMPLANT
CANNULA SHOULDER 7CM (CANNULA) ×2 IMPLANT
CANNULA TWIST IN 8.25X7CM (CANNULA) ×1 IMPLANT
COOLER ICEMAN CLASSIC (MISCELLANEOUS) ×2 IMPLANT
COVER WAND RF STERILE (DRAPES) IMPLANT
DECANTER SPIKE VIAL GLASS SM (MISCELLANEOUS) IMPLANT
DERMABOND ADVANCED (GAUZE/BANDAGES/DRESSINGS)
DERMABOND ADVANCED .7 DNX12 (GAUZE/BANDAGES/DRESSINGS) IMPLANT
DISSECTOR  3.8MM X 13CM (MISCELLANEOUS) ×2
DISSECTOR 3.8MM X 13CM (MISCELLANEOUS) ×1 IMPLANT
DRAPE IMP U-DRAPE 54X76 (DRAPES) ×2 IMPLANT
DRAPE INCISE IOBAN 66X45 STRL (DRAPES) ×2 IMPLANT
DRAPE STERI 35X30 U-POUCH (DRAPES) ×2 IMPLANT
DRAPE U-SHAPE 47X51 STRL (DRAPES) ×2 IMPLANT
DRAPE U-SHAPE 76X120 STRL (DRAPES) ×4 IMPLANT
DRSG PAD ABDOMINAL 8X10 ST (GAUZE/BANDAGES/DRESSINGS) ×2 IMPLANT
DURAPREP 26ML APPLICATOR (WOUND CARE) ×2 IMPLANT
ELECT REM PT RETURN 9FT ADLT (ELECTROSURGICAL) ×2
ELECTRODE REM PT RTRN 9FT ADLT (ELECTROSURGICAL) ×1 IMPLANT
FIBER TAPE 2MM (SUTURE) IMPLANT
GAUZE SPONGE 4X4 12PLY STRL (GAUZE/BANDAGES/DRESSINGS) ×2 IMPLANT
GAUZE XEROFORM 1X8 LF (GAUZE/BANDAGES/DRESSINGS) ×2 IMPLANT
GLOVE SURG LTX SZ7 (GLOVE) ×2 IMPLANT
GLOVE SURG NEOP MICRO LF SZ7.5 (GLOVE) ×2 IMPLANT
GLOVE SURG POLYISO LF SZ7.5 (GLOVE) ×4 IMPLANT
GLOVE SURG SYN 7.5  E (GLOVE) ×2
GLOVE SURG SYN 7.5 E (GLOVE) ×1 IMPLANT
GLOVE SURG SYN 7.5 PF PI (GLOVE) ×1 IMPLANT
GLOVE SURG UNDER POLY LF SZ7 (GLOVE) ×2 IMPLANT
GOWN STRL REIN XL XLG (GOWN DISPOSABLE) ×2 IMPLANT
GOWN STRL REUS W/ TWL LRG LVL3 (GOWN DISPOSABLE) ×1 IMPLANT
GOWN STRL REUS W/ TWL XL LVL3 (GOWN DISPOSABLE) ×1 IMPLANT
GOWN STRL REUS W/TWL LRG LVL3 (GOWN DISPOSABLE) ×2
GOWN STRL REUS W/TWL XL LVL3 (GOWN DISPOSABLE) ×2
MANIFOLD NEPTUNE II (INSTRUMENTS) ×2 IMPLANT
NDL SCORPION MULTI FIRE (NEEDLE) IMPLANT
NDL SUT 6 .5 CRC .975X.05 MAYO (NEEDLE) IMPLANT
NEEDLE MAYO TAPER (NEEDLE)
NEEDLE SCORPION MULTI FIRE (NEEDLE) ×2 IMPLANT
PACK ARTHROSCOPY DSU (CUSTOM PROCEDURE TRAY) ×2 IMPLANT
PACK BASIN DAY SURGERY FS (CUSTOM PROCEDURE TRAY) ×2 IMPLANT
PAD COLD SHLDR WRAP-ON (PAD) ×2 IMPLANT
PORT APPOLLO RF 90DEGREE MULTI (SURGICAL WAND) ×2 IMPLANT
SHEET MEDIUM DRAPE 40X70 STRL (DRAPES) ×2 IMPLANT
SLEEVE SCD COMPRESS KNEE MED (STOCKING) ×2 IMPLANT
SLING ARM FOAM STRAP LRG (SOFTGOODS) IMPLANT
STRIP CLOSURE SKIN 1/2X4 (GAUZE/BANDAGES/DRESSINGS) IMPLANT
SUT ETHILON 3 0 PS 1 (SUTURE) ×2 IMPLANT
SUT FIBERWIRE #2 38 T-5 BLUE (SUTURE)
SUT MNCRL AB 4-0 PS2 18 (SUTURE) ×2 IMPLANT
SUT PDS AB 1 CT  36 (SUTURE)
SUT PDS AB 1 CT 36 (SUTURE) IMPLANT
SUT TIGER TAPE 7 IN WHITE (SUTURE) IMPLANT
SUT VIC AB 0 CT1 27 (SUTURE) ×2
SUT VIC AB 0 CT1 27XCR 8 STRN (SUTURE) ×1 IMPLANT
SUT VIC AB 2-0 CT1 27 (SUTURE) ×2
SUT VIC AB 2-0 CT1 TAPERPNT 27 (SUTURE) ×1 IMPLANT
SUTURE FIBERWR #2 38 T-5 BLUE (SUTURE) IMPLANT
SUTURE TAPE 1.3 40 TPR END (SUTURE) IMPLANT
SUTURE TAPE TIGERLINK 1.3MM BL (SUTURE) IMPLANT
SUTURETAPE 1.3 40 TPR END (SUTURE)
SUTURETAPE TIGERLINK 1.3MM BL (SUTURE) ×2
SYR TOOMEY 50ML (SYRINGE) IMPLANT
TAPE FIBER 2MM 7IN #2 BLUE (SUTURE) IMPLANT
TOWEL GREEN STERILE FF (TOWEL DISPOSABLE) ×4 IMPLANT
TUBE CONNECTING 20X1/4 (TUBING) IMPLANT
TUBING ARTHROSCOPY IRRIG 16FT (MISCELLANEOUS) IMPLANT
WATER STERILE IRR 1000ML POUR (IV SOLUTION) ×1 IMPLANT

## 2020-10-22 NOTE — H&P (Signed)
PREOPERATIVE H&P  Chief Complaint: LEFT SHOULDER SLAP TEAR  HPI: Jimmy Fisher is a 40 y.o. male who presents for surgical treatment of LEFT SHOULDER SLAP TEAR.  He denies any changes in medical history.  Past Medical History:  Diagnosis Date  . Anxiety   . Depression   . DVT (deep venous thrombosis) (HCC)    occurred during hospitalization for overdose  . GERD (gastroesophageal reflux disease)   . Hiatal hernia   . HIV (human immunodeficiency virus infection) (HCC)   . Hypercholesteremia   . Hypertension   . Neuromuscular disorder (HCC)    fibromyalgia  . Pulmonary embolism (HCC)    2012  . SLAP tear of shoulder    left   Past Surgical History:  Procedure Laterality Date  . COLONOSCOPY    . I & D EXTREMITY Left 07/16/2020   Procedure: IRRIGATION AND DEBRIDEMENT FOREARM;  Surgeon: Bradly Bienenstock, MD;  Location: [facility] OR;  Service: Orthopedics;  Laterality: Left;  . UPPER GI ENDOSCOPY     Social History   Socioeconomic History  . Marital status: Married    Spouse name: Not on file  . Number of children: Not on file  . Years of education: Not on file  . Highest education level: Not on file  Occupational History  . Not on file  Tobacco Use  . Smoking status: Current Every Day Smoker    Types: E-cigarettes  . Smokeless tobacco: Former Neurosurgeon    Types: Snuff  Vaping Use  . Vaping Use: Every day  . Substances: Nicotine  Substance and Sexual Activity  . Alcohol use: Not Currently    Comment: quit 06/2020  . Drug use: Not on file    Comment: feb 2022  . Sexual activity: Not on file  Other Topics Concern  . Not on file  Social History Narrative  . Not on file   Social Determinants of Health   Financial Resource Strain: Not on file  Food Insecurity: Not on file  Transportation Needs: Not on file  Physical Activity: Not on file  Stress: Not on file  Social Connections: Not on file   History reviewed. No pertinent family history. No Known Allergies Prior  to Admission medications   Medication Sig Start Date End Date Taking? Authorizing Provider  amitriptyline (ELAVIL) 10 MG tablet Take 0.5 tablets (5 mg total) by mouth at bedtime. After 2 weeks if symptoms persist, you may start taking 1 whole tablet nightly before bed. 09/24/20  Yes Glenford Bayley, MD  BENICAR 20 MG tablet TAKE 1 TABLET(20 MG) BY MOUTH DAILY Patient taking differently: Take 20 mg by mouth daily. 05/29/20  Yes Evlyn Kanner, MD  BIKTARVY 50-200-25 MG TABS tablet TAKE 1 TABLET BY MOUTH DAILY Patient taking differently: Take 1 tablet by mouth daily. 07/01/20  Yes Ginnie Smart, MD  cetirizine (ZYRTEC) 10 MG tablet Take 10 mg by mouth daily.   Yes [provider]  FLUoxetine (PROZAC) 20 MG capsule Take 1 capsule (20 mg total) by mouth daily. 09/24/20 09/24/21 Yes Glenford Bayley, MD  omeprazole (PRILOSEC) 20 MG capsule Take 2 capsules (40 mg total) by mouth daily. 09/02/20  Yes Ginnie Smart, MD  traMADol (ULTRAM) 50 MG tablet Take 1 tablet (50 mg total) by mouth 3 (three) times daily as needed. 10/09/20  Yes Cristie Hem, PA-C  ibuprofen (ADVIL) 200 MG tablet Take 400 mg by mouth every 6 (six) hours as needed for headache or moderate pain.  [provider]  pramoxine (PROCTOFOAM) 1 % foam Place 1 application rectally 3 (three) times daily as needed for anal itching. 08/19/20   Ginnie Smart, MD     Positive ROS: All other systems have been reviewed and were otherwise negative with the exception of those mentioned in the HPI and as above.  Physical Exam: General: Alert, no acute distress Cardiovascular: No pedal edema Respiratory: No cyanosis, no use of accessory musculature GI: abdomen soft Skin: No lesions in the area of chief complaint Neurologic: Sensation intact distally Psychiatric: Patient is competent for consent with normal mood and affect Lymphatic: no lymphedema  MUSCULOSKELETAL: exam stable  Assessment: LEFT SHOULDER SLAP  TEAR  Plan: Plan for Procedure(s): LEFT SHOULDER ARTHROSCOPY, BICEPS TENODESIS  The risks benefits and alternatives were discussed with the patient including but not limited to the risks of nonoperative treatment, versus surgical intervention including infection, bleeding, nerve injury,  blood clots, cardiopulmonary complications, morbidity, mortality, among others, and they were willing to proceed.   Preoperative templating of the joint replacement has been completed, documented, and submitted to the Operating Room personnel in order to optimize intra-operative equipment management.   Glee Arvin, MD 10/22/2020 1:41 PM

## 2020-10-22 NOTE — Anesthesia Procedure Notes (Signed)
Anesthesia Regional Block: Interscalene brachial plexus block   Pre-Anesthetic Checklist: ,, timeout performed, Correct Patient, Correct Site, Correct Laterality, Correct Procedure, Correct Position, site marked, Risks and benefits discussed,  Surgical consent,  Pre-op evaluation,  At surgeon's request and post-op pain management  Laterality: Left  Prep: Maximum Sterile Barrier Precautions used, chloraprep       Needles:  Injection technique: Single-shot  Needle Type: Echogenic Stimulator Needle     Needle Length: 4cm  Needle Gauge: 22     Additional Needles:   Procedures:,,,, ultrasound used (permanent image in chart),,,,  Narrative:  Start time: 10/22/2020 1:26 PM End time: 10/22/2020 1:36 PM Injection made incrementally with aspirations every 5 mL.  Performed by: Personally  Anesthesiologist: Elmer Picker, MD  Additional Notes: Monitors applied. No increased pain on injection. No increased resistance to injection. Injection made in 5cc increments. Good needle visualization. Patient tolerated procedure well.

## 2020-10-22 NOTE — Discharge Instructions (Signed)
No Tylenol until 6:11 pm   Post Anesthesia Home Care Instructions  Activity: Get plenty of rest for the remainder of the day. A responsible individual must stay with you for 24 hours following the procedure.  For the next 24 hours, DO NOT: -Drive a car -Advertising copywriter -Drink alcoholic beverages -Take any medication unless instructed by your physician -Make any legal decisions or sign important papers.  Meals: Start with liquid foods such as gelatin or soup. Progress to regular foods as tolerated. Avoid greasy, spicy, heavy foods. If nausea and/or vomiting occur, drink only clear liquids until the nausea and/or vomiting subsides. Call your physician if vomiting continues.  Special Instructions/Symptoms: Your throat may feel dry or sore from the anesthesia or the breathing tube placed in your throat during surgery. If this causes discomfort, gargle with warm salt water. The discomfort should disappear within 24 hours.  If you had a scopolamine patch placed behind your ear for the management of post- operative nausea and/or vomiting:  1. The medication in the patch is effective for 72 hours, after which it should be removed.  Wrap patch in a tissue and discard in the trash. Wash hands thoroughly with soap and water. 2. You may remove the patch earlier than 72 hours if you experience unpleasant side effects which may include dry mouth, dizziness or visual disturbances. 3. Avoid touching the patch. Wash your hands with soap and water after contact with the patch.    Information for Discharge Teaching: EXPAREL (bupivacaine liposome injectable suspension)   Your surgeon or anesthesiologist gave you EXPAREL(bupivacaine) to help control your pain after surgery.   EXPAREL is a local anesthetic that provides pain relief by numbing the tissue around the surgical site.  EXPAREL is designed to release pain medication over time and can control pain for up to 72 hours.  Depending on how you  respond to EXPAREL, you may require less pain medication during your recovery.  Possible side effects:  Temporary loss of sensation or ability to move in the area where bupivacaine was injected.  Nausea, vomiting, constipation  Rarely, numbness and tingling in your mouth or lips, lightheadedness, or anxiety may occur.  Call your doctor right away if you think you may be experiencing any of these sensations, or if you have other questions regarding possible side effects.  Follow all other discharge instructions given to you by your surgeon or nurse. Eat a healthy diet and drink plenty of water or other fluids.  If you return to the hospital for any reason within 96 hours following the administration of EXPAREL, it is important for health care providers to know that you have received this anesthetic. A teal colored band has been placed on your arm with the date, time and amount of EXPAREL you have received in order to alert and inform your health care providers. Please leave this armband in place for the full 96 hours following administration, and then you may remove the band.

## 2020-10-22 NOTE — Anesthesia Postprocedure Evaluation (Signed)
Anesthesia Post Note  Patient: Jimmy Fisher  Procedure(s) Performed: LEFT SHOULDER ARTHROSCOPY, BICEPS TENODESIS (Left Shoulder) SUBACROMIAL DECOMPRESSION (Left Shoulder)     Patient location during evaluation: PACU Anesthesia Type: Regional and General Level of consciousness: awake and alert Pain management: pain level controlled Vital Signs Assessment: post-procedure vital signs reviewed and stable Respiratory status: spontaneous breathing, nonlabored ventilation, respiratory function stable and patient connected to nasal cannula oxygen Cardiovascular status: blood pressure returned to baseline and stable Postop Assessment: no apparent nausea or vomiting Anesthetic complications: no   No complications documented.  Last Vitals:  Vitals:   10/22/20 1600 10/22/20 1618  BP: 128/82 140/85  Pulse: 89 89  Resp: 18 16  Temp:  36.6 C  SpO2: 94% 96%    Last Pain:  Vitals:   10/22/20 1618  TempSrc:   PainSc: 8                  Nickolaus Bordelon L Rosalia Mcavoy

## 2020-10-22 NOTE — Anesthesia Preprocedure Evaluation (Addendum)
Anesthesia Evaluation  Patient identified by MRN, date of birth, ID band Patient awake    Reviewed: Allergy & Precautions, NPO status , Patient's Chart, lab work & pertinent test results  Airway Mallampati: I  TM Distance: >3 FB Neck ROM: Full    Dental no notable dental hx. (+) Teeth Intact, Dental Advisory Given   Pulmonary Current SmokerPatient did not abstain from smoking., PE   Pulmonary exam normal breath sounds clear to auscultation       Cardiovascular hypertension, Pt. on medications + DVT  Normal cardiovascular exam Rhythm:Regular Rate:Normal     Neuro/Psych PSYCHIATRIC DISORDERS Anxiety Depression negative neurological ROS     GI/Hepatic hiatal hernia, GERD  Controlled,(+)     substance abuse  ,   Endo/Other  negative endocrine ROS  Renal/GU negative Renal ROS  negative genitourinary   Musculoskeletal  (+) Fibromyalgia -, narcotic dependent  Abdominal   Peds  Hematology  (+) HIV,   Anesthesia Other Findings   Reproductive/Obstetrics                            Anesthesia Physical Anesthesia Plan  ASA: III  Anesthesia Plan: General and Regional   Post-op Pain Management:  Regional for Post-op pain   Induction: Intravenous  PONV Risk Score and Plan: 1 and Midazolam, Dexamethasone and Ondansetron  Airway Management Planned: Oral ETT and LMA  Additional Equipment:   Intra-op Plan:   Post-operative Plan: Extubation in OR  Informed Consent: I have reviewed the patients History and Physical, chart, labs and discussed the procedure including the risks, benefits and alternatives for the proposed anesthesia with the patient or authorized representative who has indicated his/her understanding and acceptance.     Dental advisory given  Plan Discussed with: CRNA  Anesthesia Plan Comments:         Anesthesia Quick Evaluation

## 2020-10-22 NOTE — Transfer of Care (Signed)
Immediate Anesthesia Transfer of Care Note  Patient: Jimmy Fisher  Procedure(s) Performed: LEFT SHOULDER ARTHROSCOPY, BICEPS TENODESIS (Left Shoulder)  Patient Location: PACU  Anesthesia Type:GA combined with regional for post-op pain  Level of Consciousness: awake, alert  and oriented  Airway & Oxygen Therapy: Patient Spontanous Breathing and Patient connected to face mask oxygen  Post-op Assessment: Report given to RN and Post -op Vital signs reviewed and stable  Post vital signs: Reviewed and stable  Last Vitals:  Vitals Value Taken Time  BP 122/102 10/22/20 1545  Temp 36.6 C 10/22/20 1543  Pulse 94 10/22/20 1547  Resp 17 10/22/20 1547  SpO2 100 % 10/22/20 1547  Vitals shown include unvalidated device data.  Last Pain:  Vitals:   10/22/20 1203  TempSrc: Axillary  PainSc: 6       Patients Stated Pain Goal: 5 (10/22/20 1203)  Complications: No complications documented.

## 2020-10-22 NOTE — Anesthesia Procedure Notes (Signed)
Procedure Name: Intubation Date/Time: 10/22/2020 2:32 PM Performed by: Lavonia Dana, CRNA Pre-anesthesia Checklist: Patient identified, Emergency Drugs available, Suction available and Patient being monitored Patient Re-evaluated:Patient Re-evaluated prior to induction Oxygen Delivery Method: Circle system utilized Preoxygenation: Pre-oxygenation with 100% oxygen Induction Type: IV induction Ventilation: Mask ventilation without difficulty and Oral airway inserted - appropriate to patient size Laryngoscope Size: Mac and 3 Grade View: Grade I Tube type: Oral Tube size: 7.0 mm Number of attempts: 1 Airway Equipment and Method: Stylet,  Oral airway and Bite block Placement Confirmation: ETT inserted through vocal cords under direct vision,  positive ETCO2 and breath sounds checked- equal and bilateral Secured at: 24 cm Tube secured with: Tape Dental Injury: Teeth and Oropharynx as per pre-operative assessment

## 2020-10-22 NOTE — Op Note (Signed)
Date of Surgery: 10/22/2020  INDICATIONS: The patient is a 40 year old male with left shoulder pain that has failed conservative treatment;  The patient did consent to the procedure after discussion of the risks and benefits.  PREOPERATIVE DIAGNOSIS:  1.  Left shoulder anterior superior posterior inferior labral tear 2.  Left shoulder type II SLAP tear 3.  Left shoulder impingement syndrome, subacromial bursitis  POSTOPERATIVE DIAGNOSIS: Same.  PROCEDURE:  1.  Left shoulder arthroscopic biceps tenodesis 2.  Left shoulder arthroscopic extensive debridement of labrum, glenoid surface, bursal surface rotator cuff 3.  Left shoulder arthroscopic subacromial decompression with CA ligament release  SURGEON: N. Glee Arvin, M.D.  ASSIST: Oneal Grout, PA-C  ANESTHESIA:  general, regional  IV FLUIDS AND URINE: See anesthesia.  ESTIMATED BLOOD LOSS: minimal mL.  IMPLANTS: Arthrex 4.75 mm swivel lock  COMPLICATIONS: None.  DESCRIPTION OF PROCEDURE: The patient was brought to the operating room and placed supine on the operating table.  The patient had been signed prior to the procedure and this was documented. The patient had the anesthesia placed by the anesthesiologist.  A time-out was performed to confirm that this was the correct patient, site, side and location. The patient did receive antibiotics prior to the incision and was re-dosed during the procedure as needed at indicated intervals.  The patient was then positioned into the beach chair position with all bony prominences well padded and neutral C spine. The patient had the operative extremity prepped and draped in the standard surgical fashion.    Incisions were made for standard shoulder arthroscopy portals.  Diagnostic arthroscopy was first performed.  The glenoid labrum was torn from the 9 o'clock position all the way around to the 6 o'clock position in a clockwise fashion.  The anterior inferior portion of the labrum  appeared to be intact.  He had a type II SLAP tear.  There is mild synovitis.  The articular surface of the rotator cuff was intact and without tendinopathy.  There was a small area about a centimeter in diameter near the inferior portion of the glenoid with grade IV chondromalacia surrounded by a few flaps of cartilage.  Chondroplasty was performed back to stable border of cartilage.  We then turned our attention to the biceps tenodesis.  A fiber loop was first used to luggage tag the biceps tendon.  The scorpion was then used to pierce the biceps tendon in order to lock the suture in place.  The biceps was then tenotomized as proximally as possible.  The biceps was then tenodesed into the bicipital groove just superior to the subscapularis tendon using a self punching 4.75 mm swivel lock.  The bone quality was excellent.  The sutures were cut short.  The labrum was then debrided using oscillating shaver back to stable border.  We then reposition the arthroscope into the subacromial space.  There was thick adherent subacromial bursitis which was resected.  The bursal surface of the rotator cuff showed some mild tendinopathy.  Gentle debridement was performed here.  Subacromial decompression was then performed and the CA ligament release was done.  The [facility] joint was unremarkable.  Excess fluid was removed from the shoulder joint.  Incisions were closed with interrupted nylon sutures.  Sterile dressings were applied.  Shoulder sling was placed.  Patient tolerated procedure well had no many complications.  Tessa Lerner, my PA, was a medical necessity for the entirety of the surgery including opening, closing, limb positioning, retracting, exposing, and repairing.  POSTOPERATIVE  PLAN: Patient will be discharged home and follow-up in 1 week for suture removal.  Mayra Reel, MD [facility] [phone] 3:26 PM

## 2020-10-22 NOTE — Progress Notes (Signed)
Assisted Dr. Armond Hang with left, ultrasound guided upper left side  block. Side rails up, monitors on throughout procedure. See vital signs in flow sheet. Tolerated Procedure well.

## 2020-10-23 ENCOUNTER — Encounter (HOSPITAL_BASED_OUTPATIENT_CLINIC_OR_DEPARTMENT_OTHER): Payer: Self-pay | Admitting: Orthopaedic Surgery

## 2020-10-23 ENCOUNTER — Telehealth: Payer: Self-pay

## 2020-10-23 NOTE — Telephone Encounter (Signed)
Patient called he has some questions regarding his surgery call back:[phone]

## 2020-10-24 NOTE — Telephone Encounter (Signed)
Patient would like to know if he can use Donjoy iceman machine. Please resond to patient via mychart. Thanks.

## 2020-10-24 NOTE — Telephone Encounter (Signed)
Yes he can use the iceman for sure.  Would strongly encourage that he use it.

## 2020-10-27 ENCOUNTER — Other Ambulatory Visit: Payer: Self-pay | Admitting: Infectious Diseases

## 2020-10-27 DIAGNOSIS — B20 Human immunodeficiency virus [HIV] disease: Secondary | ICD-10-CM

## 2020-10-27 NOTE — Telephone Encounter (Signed)
How often should  he use this?

## 2020-10-27 NOTE — Telephone Encounter (Signed)
As often as he can.  At least 3 times a day for 30 mins

## 2020-10-27 NOTE — Telephone Encounter (Signed)
Message sent on mychart 

## 2020-10-28 ENCOUNTER — Ambulatory Visit: Payer: Self-pay | Admitting: Behavioral Health

## 2020-10-28 ENCOUNTER — Other Ambulatory Visit: Payer: Self-pay

## 2020-10-28 DIAGNOSIS — F331 Major depressive disorder, recurrent, moderate: Secondary | ICD-10-CM

## 2020-10-28 DIAGNOSIS — F419 Anxiety disorder, unspecified: Secondary | ICD-10-CM

## 2020-10-28 NOTE — BH Specialist Note (Signed)
Integrated Behavioral Health via Telemedicine Visit  10/28/2020 Jimmy Fisher [id number]  Number of Integrated Behavioral Health visits: 1/6 Session Start time: 3:00pm  Session End time: 3:45pm Total time: 45   Referring Provider: Dr. Glenford Bayley, MD Patient/Family location: Pt @ home in private [facility] Provider location: [facility] Office All persons participating in visit: Pt & Clinician Types of Service: Individual psychotherapy  I connected with Jimmy Fisher and/or Jimmy Fisher's self via  Telephone or Video Enabled Telemedicine Application  (Video is Caregility application) and verified that I am speaking with the correct person using two identifiers. Discussed confidentiality: Yes   I discussed the limitations of telemedicine and the availability of in person appointments.  Discussed there is a possibility of technology failure and discussed alternative modes of communication if that failure occurs.  I discussed that engaging in this telemedicine visit, they consent to the provision of behavioral healthcare and the services will be billed under their insurance.  Patient and/or legal guardian expressed understanding and consented to Telemedicine visit: Yes   Presenting Concerns: Patient and/or family reports the following symptoms/concerns: elevated Duration of problem: yrs, but more intense this year since Wife found about his addiction in Dec 2021; Severity of problem: moderate  Patient and/or Family's Strengths/Protective Factors: Social and Emotional competence, Concrete supports in place (healthy food, safe environments, etc.), Sense of purpose and Physical Health (exercise, healthy diet, medication compliance, etc.)  Goals Addressed: Patient will: 1.  Reduce symptoms of: anxiety, depression and stress  2.  Increase knowledge and/or ability of: coping skills, healthy habits and stress reduction  3.  Demonstrate ability to: Increase healthy adjustment to current  life circumstances  Progress towards Goals: Estb'd today; Pt will call prn for [facility] services once he returns from Rehab in the Fall  Interventions: Interventions utilized:  Solution-Focused Strategies and Supportive Counseling Standardized Assessments completed: Not Needed  Patient and/or Family Response: Pt receptive to visit today & requests future appts prn in the Fall   Assessment: Patient currently experiencing elevated pain & anxiety due to recent shoulder surgery. Pt has plans to attend a Rx Rehab Prog in June. It is a 90d Prog. 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 He is also raising funds to cover the remainder of the balance.  Patient may benefit from Psychotherapy to address his addiction issues. Pt has Hx of relapse, more recently leading to the loss of a job he was passionate about that invld Recovery Support/Relapse Prevention.   Pt had relapse in April 2021 after Dx of HIV. Pt & Wife split in Dec 2021 when she found out about his various addictions. Wife kicked him out of the home & he was gone for 2 mos. They reconciled & she has been supportive since that time.  Pt feels his Prozac is working, but he has pain not well controlled for which he places in the severe range of 7-10.  Pt & Wife have 2 Sons, ages 39 & 73. Oldest Son is not speaking w/Pt currently.  Plan: 1. Follow up with behavioral health clinician on : prn in the Fall of 2022 2. Behavioral recommendations: Take notes on concerns for your session w/new Therapist in [place]. Keep your focus & fllw Surgeon's orders. 3. Referral(s): Integrated [employer] (In Clinic)  I discussed the assessment and treatment plan with the patient and/or parent/guardian. They were provided an opportunity to ask questions and all were answered. They agreed with the plan and demonstrated an understanding of  the instructions.   They were advised to call back or seek an in-person evaluation if the symptoms worsen or if  the condition fails to improve as anticipated.  Deneise Lever, LMFT

## 2020-10-29 ENCOUNTER — Encounter: Payer: Self-pay | Admitting: Physician Assistant

## 2020-10-29 ENCOUNTER — Ambulatory Visit (INDEPENDENT_AMBULATORY_CARE_PROVIDER_SITE_OTHER): Payer: Self-pay | Admitting: Physician Assistant

## 2020-10-29 DIAGNOSIS — Z9889 Other specified postprocedural states: Secondary | ICD-10-CM

## 2020-10-29 MED ORDER — HYDROCODONE-ACETAMINOPHEN 5-325 MG PO TABS: 1.0000 | ORAL_TABLET | Freq: Three times a day (TID) | ORAL | 0 refills | Status: DC | PRN

## 2020-10-29 NOTE — Progress Notes (Signed)
Post-Op Visit Note   Patient: Jimmy Fisher           Date of Birth: 1980-09-10           MRN: [id number] Visit Date: 10/29/2020 PCP: Evlyn Kanner, MD   Assessment & Plan:  Chief Complaint:  Chief Complaint  Patient presents with  . Left Shoulder - Pain   Visit Diagnoses:  1. S/P arthroscopy of left shoulder     Plan: Patient is a pleasant 40 year old gentleman who comes in today 1 week out left shoulder arthroscopic debridement, subacromial decompression and biceps tenodesis.  He has been doing well.  He has been in mild to moderate pain which is relieved with narcotics.  Examination of his left shoulder reveals fully healed surgical portals without complication.  Nylon sutures in place.  Today, sutures were removed and Steri-Strips applied, intraoperative pictures reviewed, shoulder exercises provided and internal referral to physical therapy has been made.  I refilled his Norco.  I have instructed him to not lift anything greater than 20 pounds for a total of 6 weeks postop.  He will follow-up with [place] in 5 weeks time for recheck.  Call with concerns or questions in the meantime.  Follow-Up Instructions: Return in about 5 weeks (around 12/03/2020).   Orders:  Orders Placed This Encounter  Procedures  . Ambulatory referral to Physical Therapy   Meds ordered this encounter  Medications  . HYDROcodone-acetaminophen (NORCO) 5-325 MG tablet    Sig: Take 1 tablet by mouth 3 (three) times daily as needed.    Dispense:  30 tablet    Refill:  0    Imaging: No new imaging  PMFS History: Patient Active Problem List   Diagnosis Date Noted  . Superior labrum anterior-to-posterior (SLAP) tear of left shoulder   . Subacromial bursitis of left shoulder joint   . Impingement syndrome of left shoulder   . Labral tear of shoulder 08/19/2020  . Numbness and tingling of leg 08/19/2020  . Hemorrhoids 08/19/2020  . Hypertension 04/03/2020  . 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  Generalized anxiety disorder 01/30/2020  . Flank pain 01/10/2020  . Healthcare maintenance 01/10/2020  . Fibromyalgia 01/10/2020  . Hyperlipemia 01/10/2020  . GERD (gastroesophageal reflux disease) 12/18/2019  . HIV disease (HCC)   . Opioid dependence with opioid-induced mood disorder (HCC)   . 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 Anxiety   . Depression   . DVT (deep venous thrombosis) (HCC)    occurred during hospitalization for overdose  . GERD (gastroesophageal reflux disease)   . Hiatal hernia   . HIV (human immunodeficiency virus infection) (HCC)   . Hypercholesteremia   . Hypertension   . Neuromuscular disorder (HCC)    fibromyalgia  . Pulmonary embolism (HCC)    2012  . SLAP tear of shoulder    left    History reviewed. No pertinent family history.  Past Surgical History:  Procedure Laterality Date  . COLONOSCOPY    . I & D EXTREMITY Left 07/16/2020   Procedure: IRRIGATION AND DEBRIDEMENT FOREARM;  Surgeon: Bradly Bienenstock, MD;  Location: [facility] OR;  Service: Orthopedics;  Laterality: Left;  . SHOULDER ARTHROSCOPY WITH BICEPS TENDON REPAIR Left 10/22/2020   Procedure: LEFT SHOULDER ARTHROSCOPY, BICEPS TENODESIS;  Surgeon: Tarry Kos, MD;  Location: [facility];  Service: Orthopedics;  Laterality: Left;  . SUBACROMIAL DECOMPRESSION Left 10/22/2020   Procedure: SUBACROMIAL  DECOMPRESSION;  Surgeon: Tarry Kos, MD;  Location: [facility];  Service: Orthopedics;  Laterality: Left;  . UPPER GI ENDOSCOPY     Social History   Occupational History  . Not on file  Tobacco Use  . Smoking status: Current Every Day Smoker    Types: E-cigarettes  . Smokeless tobacco: Former Neurosurgeon    Types: Snuff  Vaping Use  . Vaping Use: Every day  . Substances: Nicotine  Substance and Sexual Activity  . Alcohol use: Not Currently    Comment: quit 06/2020  . Drug use:  Not on file    Comment: feb 2022  . Sexual activity: Not on file

## 2020-10-31 ENCOUNTER — Ambulatory Visit: Payer: Self-pay | Attending: Physician Assistant

## 2020-10-31 ENCOUNTER — Other Ambulatory Visit: Payer: Self-pay

## 2020-10-31 DIAGNOSIS — M6281 Muscle weakness (generalized): Secondary | ICD-10-CM | POA: Insufficient documentation

## 2020-10-31 DIAGNOSIS — M67814 Other specified disorders of tendon, left shoulder: Secondary | ICD-10-CM | POA: Insufficient documentation

## 2020-10-31 DIAGNOSIS — M25612 Stiffness of left shoulder, not elsewhere classified: Secondary | ICD-10-CM | POA: Insufficient documentation

## 2020-10-31 DIAGNOSIS — Z9889 Other specified postprocedural states: Secondary | ICD-10-CM | POA: Insufficient documentation

## 2020-10-31 DIAGNOSIS — S43432S Superior glenoid labrum lesion of left shoulder, sequela: Secondary | ICD-10-CM | POA: Insufficient documentation
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
## 2020-11-02 ENCOUNTER — Other Ambulatory Visit: Payer: Self-pay | Admitting: Orthopaedic Surgery

## 2020-11-03 ENCOUNTER — Telehealth: Payer: Self-pay | Admitting: Orthopaedic Surgery

## 2020-11-03 NOTE — Telephone Encounter (Signed)
Patient called. He would like a refill on oxycodone called in for him. His call back number is [phone]

## 2020-11-03 NOTE — Telephone Encounter (Signed)
That prescription was written by orthopedics. I would recommend reaching out to your orthopedic surgeon.

## 2020-11-04 ENCOUNTER — Other Ambulatory Visit: Payer: Self-pay | Admitting: Physician Assistant

## 2020-11-04 MED ORDER — HYDROCODONE-ACETAMINOPHEN 5-325 MG PO TABS: 1.0000 | ORAL_TABLET | Freq: Every day | ORAL | 0 refills | Status: DC | PRN

## 2020-11-04 NOTE — Telephone Encounter (Signed)
Sent in norco

## 2020-11-04 NOTE — Telephone Encounter (Signed)
I called and advised. 

## 2020-11-06 NOTE — H&P (Signed)
Patient's anticipated LOS is less than 2 midnights, meeting these requirements: - Younger than 47 - Lives within 1 hour of care - Has a competent adult at home to recover with post-op recover - NO history of  - Chronic pain requiring opiods  - Diabetes  - Coronary Artery Disease  - Heart failure  - Heart attack  - Stroke  - DVT/VTE  - Cardiac arrhythmia  - Respiratory Failure/COPD  - Renal failure  - Anemia  - Advanced Liver disease       Jimmy Fisher is an 40 y.o. male.    Chief Complaint: left shoulder pain  HPI: Pt is a 40 y.o. male complaining of left shoulder pain for multiple months. Pain had continually increased since the beginning. X-rays in the clinic show labral tear left shoulder. Pt has tried various conservative treatments which have failed to alleviate their symptoms, including injections and therapy. Various options are discussed with the patient. Risks, benefits and expectations were discussed with the patient. Patient understand the risks, benefits and expectations and wishes to proceed with surgery.   PCP:  Evlyn Kanner, MD  D/C Plans: Home  PMH: Past Medical History:  Diagnosis Date  . Anxiety   . Depression   . DVT (deep venous thrombosis) (HCC)    occurred during hospitalization for overdose  . GERD (gastroesophageal reflux disease)   . Hiatal hernia   . HIV (human immunodeficiency virus infection) (HCC)   . Hypercholesteremia   . Hypertension   . Neuromuscular disorder (HCC)    fibromyalgia  . Pulmonary embolism (HCC)    2012  . SLAP tear of shoulder    left    PSH: Past Surgical History:  Procedure Laterality Date  . COLONOSCOPY    . I & D EXTREMITY Left 07/16/2020   Procedure: IRRIGATION AND DEBRIDEMENT FOREARM;  Surgeon: Bradly Bienenstock, MD;  Location: [facility] OR;  Service: Orthopedics;  Laterality: Left;  . SHOULDER ARTHROSCOPY WITH BICEPS TENDON REPAIR Left 10/22/2020   Procedure: LEFT SHOULDER ARTHROSCOPY, BICEPS TENODESIS;   Surgeon: Tarry Kos, MD;  Location: [facility];  Service: Orthopedics;  Laterality: Left;  . SUBACROMIAL DECOMPRESSION Left 10/22/2020   Procedure: SUBACROMIAL DECOMPRESSION;  Surgeon: Tarry Kos, MD;  Location: [facility];  Service: Orthopedics;  Laterality: Left;  . UPPER GI ENDOSCOPY      Social History:  reports that he has been smoking e-cigarettes. He has quit using smokeless tobacco.  His smokeless tobacco use included snuff. He reports previous alcohol use.  Drug: Cocaine.  Allergies:  No Known Allergies  Medications: No current facility-administered medications for this encounter.   Current Outpatient Medications  Medication Sig Dispense Refill  . amitriptyline (ELAVIL) 10 MG tablet Take 0.5 tablets (5 mg total) by mouth at bedtime. After 2 weeks if symptoms persist, you may start taking 1 whole tablet nightly before bed. 30 tablet 2  . BENICAR 20 MG tablet TAKE 1 TABLET(20 MG) BY MOUTH DAILY (Patient taking differently: Take 20 mg by mouth daily.) 90 tablet 1  . BIKTARVY 50-200-25 MG TABS tablet TAKE 1 TABLET BY MOUTH DAILY 30 tablet 3  . cetirizine (ZYRTEC) 10 MG tablet Take 10 mg by mouth daily.    Marland Kitchen FLUoxetine (PROZAC) 20 MG capsule Take 1 capsule (20 mg total) by mouth daily. 30 capsule 2  . HYDROcodone-acetaminophen (NORCO) 5-325 MG tablet Take 1-2 tablets by mouth daily as needed. 20 tablet 0  . ibuprofen (ADVIL) 200 MG tablet Take  400 mg by mouth every 6 (six) hours as needed for headache or moderate pain.    Marland Kitchen omeprazole (PRILOSEC) 20 MG capsule Take 2 capsules (40 mg total) by mouth daily. 60 capsule 0  . ondansetron (ZOFRAN) 4 MG tablet Take 1-2 tablets (4-8 mg total) by mouth every 8 (eight) hours as needed for nausea or vomiting. 20 tablet 0  . oxyCODONE-acetaminophen (PERCOCET) 5-325 MG tablet Take 1-2 tablets by mouth every 8 (eight) hours as needed for severe pain. 30 tablet 0  . pramoxine (PROCTOFOAM) 1 % foam Place 1 application  rectally 3 (three) times daily as needed for anal itching. 15 g 0  . traMADol (ULTRAM) 50 MG tablet Take 1 tablet (50 mg total) by mouth 3 (three) times daily as needed. 30 tablet 0    No results found for this or any previous visit (from the past 48 hour(s)). No results found.  ROS: Pain with rom of the left upper extremity  Physical Exam: Alert and oriented 40 y.o. male in no acute distress Cranial nerves 2-12 intact Cervical spine: full rom with no tenderness, nv intact distally Chest: active breath sounds bilaterally, no wheeze rhonchi or rales Heart: regular rate and rhythm, no murmur Abd: non tender non distended with active bowel sounds Hip is stable with rom  Left shoulder painful rom nv intact distally No rashes or edema distally  Assessment/Plan Assessment: left shoulder pain due to labral tear  Plan:  Patient will undergo a left shoulder scope by Dr. Ranell Patrick at [facility] Risks benefits and expectations were discussed with the patient. Patient understand risks, benefits and expectations and wishes to proceed. Preoperative templating of the joint replacement has been completed, documented, and submitted to the Operating Room personnel in order to optimize intra-operative equipment management.   Alphonsa Overall PA-C, MPAS [facility] is now [employer] [address], [address], [place], [place] [address] Phone: [phone] [email or web address] Facebook  [employer]

## 2020-11-11 ENCOUNTER — Encounter (HOSPITAL_COMMUNITY)
Admission: RE | Admit: 2020-11-11 | Discharge: 2020-11-11 | Disposition: A | Discharge status: Home / Self Care | Payer: Self-pay | Source: Ambulatory Visit | Attending: Anesthesiology | Admitting: Anesthesiology

## 2020-11-11 NOTE — Progress Notes (Signed)
DUE TO COVID-19 ONLY ONE VISITOR IS ALLOWED TO COME WITH YOU AND STAY IN THE WAITING ROOM ONLY DURING PRE OP AND PROCEDURE DAY OF SURGERY. THE 1 VISITOR  MAY VISIT WITH YOU AFTER SURGERY IN YOUR PRIVATE ROOM DURING VISITING HOURS ONLY!  YOU NEED TO HAVE A COVID 19 TEST ON_______ @_______ , THIS TEST MUST BE DONE BEFORE SURGERY,  COVID TESTING SITE [address] [place] , IT IS ON THE RIGHT GOING OUT [address] APPROXIMATELY  2 MINUTES PAST [employer] ON THE RIGHT. ONCE YOUR COVID TEST IS COMPLETED,  PLEASE BEGIN THE QUARANTINE INSTRUCTIONS AS OUTLINED IN YOUR HANDOUT.                Jimmy Fisher  11/11/2020   Your procedure is scheduled on:           11/21/2020   Report to [facility] Main  Entrance   Report to admitting at    0515 AM     Call this number if you have problems the morning of surgery [phone]    REMEMBER: NO  SOLID FOOD CANDY OR GUM AFTER MIDNIGHT. CLEAR LIQUIDS UNTIL  0430am        . NOTHING BY MOUTH EXCEPT CLEAR LIQUIDS UNTIL  0430am     . PLEASE FINISH ENSURE DRINK PER SURGEON ORDER  WHICH NEEDS TO BE COMPLETED AT   0430am   .      CLEAR LIQUID DIET   Foods Allowed                                                                    Coffee and tea, regular and decaf                            Fruit ices (not with fruit pulp)                                      Iced Popsicles                                    Carbonated beverages, regular and diet                                    Cranberry, grape and apple juices Sports drinks like Gatorade Lightly seasoned clear broth or consume(fat free) Sugar, honey syrup ___________________________________________________________________      BRUSH YOUR TEETH MORNING OF SURGERY AND RINSE YOUR MOUTH OUT, NO CHEWING GUM CANDY OR MINTS.     Take these medicines the morning of surgery with A SIP OF WATER:        Biktarvy, zyrtec, prozac, prilosec  DO NOT TAKE ANY  DIABETIC MEDICATIONS DAY OF YOUR SURGERY                               You may not have any metal on your body including hair  pins and              piercings  Do not wear jewelry, make-up, lotions, powders or perfumes, deodorant             Do not wear nail polish on your fingernails.  Do not shave  48 hours prior to surgery.              Men may shave face and neck.   Do not bring valuables to the hospital. [place].  Contacts, dentures or bridgework may not be worn into surgery.  Leave suitcase in the car. After surgery it may be brought to your room.     Patients discharged the day of surgery will not be allowed to drive home. IF YOU ARE HAVING SURGERY AND GOING HOME THE SAME DAY, YOU MUST HAVE AN ADULT TO DRIVE YOU HOME AND BE WITH YOU FOR 24 HOURS. YOU MAY GO HOME BY TAXI OR UBER OR ORTHERWISE, BUT AN ADULT MUST ACCOMPANY YOU HOME AND STAY WITH YOU FOR 24 HOURS.  Name and phone number of your driver:  Special Instructions: N/A              Please read over the following fact sheets you were given: _____________________________________________________________________  [facility] - Preparing for Surgery Before surgery, you can play an important role.  Because skin is not sterile, your skin needs to be as free of germs as possible.  You can reduce the number of germs on your skin by washing with CHG (chlorahexidine gluconate) soap before surgery.  CHG is an antiseptic cleaner which kills germs and bonds with the skin to continue killing germs even after washing. Please DO NOT use if you have an allergy to CHG or antibacterial soaps.  If your skin becomes reddened/irritated stop using the CHG and inform your nurse when you arrive at Short Stay. Do not shave (including legs and underarms) for at least 48 hours prior to the first CHG shower.  You may shave your face/neck. Please follow these instructions carefully:  1.  Shower with CHG Soap  the night before surgery and the  morning of Surgery.  2.  If you choose to wash your hair, wash your hair first as usual with your  normal  shampoo.  3.  After you shampoo, rinse your hair and body thoroughly to remove the  shampoo.                           4.  Use CHG as you would any other liquid soap.  You can apply chg directly  to the skin and wash                       Gently with a scrungie or clean washcloth.  5.  Apply the CHG Soap to your body ONLY FROM THE NECK DOWN.   Do not use on face/ open                           Wound or open sores. Avoid contact with eyes, ears mouth and genitals (private parts).                       Wash face,  Genitals (private parts) with your  normal soap.             6.  Wash thoroughly, paying special attention to the area where your surgery  will be performed.  7.  Thoroughly rinse your body with warm water from the neck down.  8.  DO NOT shower/wash with your normal soap after using and rinsing off  the CHG Soap.                9.  Pat yourself dry with a clean towel.            10.  Wear clean pajamas.            11.  Place clean sheets on your bed the night of your first shower and do not  sleep with pets. Day of Surgery : Do not apply any lotions/deodorants the morning of surgery.  Please wear clean clothes to the hospital/surgery center.  FAILURE TO FOLLOW THESE INSTRUCTIONS MAY RESULT IN THE CANCELLATION OF YOUR SURGERY PATIENT SIGNATURE_________________________________  NURSE SIGNATURE__________________________________  ________________________________________________________________________

## 2020-11-12 ENCOUNTER — Ambulatory Visit: Payer: Self-pay | Attending: Physician Assistant

## 2020-11-12 ENCOUNTER — Other Ambulatory Visit: Payer: Self-pay

## 2020-11-12 DIAGNOSIS — M5413 Radiculopathy, cervicothoracic region: Secondary | ICD-10-CM | POA: Insufficient documentation

## 2020-11-12 DIAGNOSIS — M542 Cervicalgia: Secondary | ICD-10-CM | POA: Insufficient documentation

## 2020-11-12 DIAGNOSIS — R293 Abnormal posture: Secondary | ICD-10-CM | POA: Insufficient documentation

## 2020-11-12 DIAGNOSIS — S43432S Superior glenoid labrum lesion of left shoulder, sequela: Secondary | ICD-10-CM | POA: Insufficient documentation

## 2020-11-12 DIAGNOSIS — Z9889 Other specified postprocedural states: Secondary | ICD-10-CM | POA: Insufficient documentation

## 2020-11-12 DIAGNOSIS — M62838 Other muscle spasm: Secondary | ICD-10-CM | POA: Insufficient documentation

## 2020-11-12 DIAGNOSIS — M6281 Muscle weakness (generalized): Secondary | ICD-10-CM | POA: Insufficient documentation

## 2020-11-12 DIAGNOSIS — M25612 Stiffness of left shoulder, not elsewhere classified: Secondary | ICD-10-CM | POA: Insufficient documentation

## 2020-11-12 DIAGNOSIS — M67814 Other specified disorders of tendon, left shoulder: Secondary | ICD-10-CM | POA: Insufficient documentation

## 2020-11-12 NOTE — Patient Instructions (Signed)
[id number]

## 2020-11-12 NOTE — Therapy (Signed)
[place], [place], [address] Phone: [phone]   Fax:  [phone]  Physical Therapy Treatment/ Discharge Summary  Patient Details  Name: Jimmy Fisher MRN: [id number] Date of Birth: [birth year] Referring Provider (PT): Aundra Dubin, [place]   Encounter Date: 11/12/2020   PT End of Session - 11/12/20 1702    Visit Number 2    Number of Visits 9    Date for PT Re-Evaluation 12/06/20    Authorization Type Self    Progress Note Due on Visit 10    PT Start Time 5093    PT Stop Time 1700    PT Time Calculation (min) 45 min    Activity Tolerance Patient tolerated treatment well    Behavior During Therapy [facility] for tasks assessed/performed           Past Medical History:  Diagnosis Date  . Anxiety   . Depression   . DVT (deep venous thrombosis) ([place])    occurred during hospitalization for overdose  . GERD (gastroesophageal reflux disease)   . Hiatal hernia   . HIV (human immunodeficiency virus infection) ([place])   . Hypercholesteremia   . Hypertension   . Neuromuscular disorder (HCC)    fibromyalgia  . Pulmonary embolism ([place])    2012  . SLAP tear of shoulder    left    Past Surgical History:  Procedure Laterality Date  . COLONOSCOPY    . I & D EXTREMITY Left 07/16/2020   Procedure: IRRIGATION AND DEBRIDEMENT FOREARM;  Surgeon: Iran Planas, MD;  Location: [place];  Service: Orthopedics;  Laterality: Left;  . SHOULDER ARTHROSCOPY WITH BICEPS TENDON REPAIR Left 10/22/2020   Procedure: LEFT SHOULDER ARTHROSCOPY, BICEPS TENODESIS;  Surgeon: Leandrew Koyanagi, MD;  Location: [place];  Service: Orthopedics;  Laterality: Left;  . SUBACROMIAL DECOMPRESSION Left 10/22/2020   Procedure: SUBACROMIAL DECOMPRESSION;  Surgeon: Leandrew Koyanagi, MD;  Location: [place];  Service: Orthopedics;  Laterality: Left;  . UPPER GI ENDOSCOPY      There were no vitals filed for this  visit.   Subjective Assessment - 11/12/20 1616    Subjective Pt reports adherence to his HEP, able to recall each of the exercises given to him at his initial evaluation. He reports that he will be leaving to [place] Sunday morning and will be gone for 90 days. He will not be able to continue PT during that time as he will be in a facility. He does, however, report that a nurse will be at the facility to help walk him through any exercises that are prescribed to him. He requests a timeline of when to start certain exercises and when to progress AROM of his L shoulder. He reports very minimal pain today.    Limitations Lifting;Other (comment)   no lifting more than 20# for 6 months   Patient Stated Goals For my L  shoulder to be normal again    Currently in Pain? Yes    Pain Score 3     Pain Location Shoulder    Pain Orientation Left    Pain Descriptors / Indicators Discomfort    Pain Type Surgical pain    Pain Onset More than a month ago    Pain Frequency Intermittent    Multiple Pain Sites No              [facility] PT Assessment - 11/12/20 0001      Observation/Other Assessments  Observations Scar extensibility WNL                         [facility] Adult PT Treatment/Exercise - 11/12/20 0001      Shoulder Exercises: Seated   Other Seated Exercises Self-assisted GHJ mobilization with towel with shoulder elevation to 90 degrees 2x30sec on L      Shoulder Exercises: Standing   Row --   2x10 with green theraband   Other Standing Exercises AAROM IR/ER with towel/cane 2x10 on L    Other Standing Exercises L Shoulder isometric IR/ER/flex/ext/abd 40% max contraction 1x10 with 5sec hold each                  PT Education - 11/12/20 1817    Education Details Pt instructed to closely followed the provided biceps tenodesis protocol from his doctor and the [facility] protocol provided in today's session in regard to exercises. He was instructed not to introduce AROM exercises  provided until week 4 and not to introduce resisted AROM until week 8-12.    Person(s) Educated Patient    Methods Explanation;Demonstration;Handout    Comprehension Verbalized understanding;Returned demonstration            PT Short Term Goals - 11/12/20 1824      PT SHORT TERM GOAL #1   Title Patient will be independent with initial HEP    Status Achieved    Target Date 11/22/20      PT SHORT TERM GOAL #2   Title Pt will voice understanding of measures to assist in symptom management    Status Achieved    Target Date 11/22/20             PT Long Term Goals - 11/12/20 1825      PT LONG TERM GOAL #1   Title Progress L shoulder abd to 90d and ER to 45d    Status Unable to assess      PT LONG TERM GOAL #2   Title Pt will tolerated L shoulder IR/ER submax isometrics without increase in L shoulder pain    Status Achieved      PT LONG TERM GOAL #3   Title Will establish additional LTGs if pt's PT continues into further phases of rehab per pt's schedule    Status Unable to assess                 Plan - 11/12/20 1813    Clinical Impression Statement Pt presents 21 days s/p subacromial decompression and biceps tenodesis by Dr. Frankey Shown on 10/22/2020. His incision is healing well and he has minimal pain at this point. He was unaware if the provided protocols from his surgeon included exercises or guidelines. Since he is going to a rehab facility on Sunday, he requested that the PT provide him with exercises to do at different points in his recovery. He was given a [facility] protocol for biceps tenodesis rehab which included 5 phases of recovery with specific exercises and activities at each phase. He was also given exercises to complete now and then in 1 week when he enters phase 2 of the [facility] protocol. He demonstrates satisfaction and understanding of these instructions. Since the patient will be unable to attend PT for the next 3 months, he is discharged from PT at this time to  independently progress his rehab program. He reports he has a nurse at the facility he will be attending who can assist in walking  him through the written exercises. It was stressed that early mobility is of particular importance so as not to lose ROM long-term.    Personal Factors and Comorbidities Comorbidity 3+    Comorbidities anxiety, depression, HIV, obesity    Examination-Activity Limitations Reach Overhead;Carry;Lift    Examination-Participation Restrictions Other    Stability/Clinical Decision Making Evolving/Moderate complexity    Clinical Decision Making Moderate    Rehab Potential Good    PT Frequency 2x / week    PT Duration 4 weeks    PT Treatment/Interventions ADLs/Self Care Home Management;Cryotherapy;Electrical Stimulation;Ultrasound;Moist Heat;Iontophoresis 38m/ml Dexamethasone;Therapeutic activities;Therapeutic exercise;Manual techniques;Patient/family education;Passive range of motion;Dry needling;Taping;Joint Manipulations    PT Next Visit Plan Pt is discharged from PT due to being unable to attend for the next 3 months.    PT Home Exercise Plan [id number]    Consulted and Agree with Plan of Care Patient           Patient will benefit from skilled therapeutic intervention in order to improve the following deficits and impairments:  Decreased range of motion,Obesity,Pain,Impaired UE functional use,Decreased strength,Increased edema  Visit Diagnosis: Superior glenoid labrum lesion of left shoulder, sequela  Biceps tendonosis of left shoulder  Status post subacromial decompression  Decreased ROM of left shoulder  Muscle weakness (generalized)  Cervicalgia  Radiculopathy, cervicothoracic region  Other muscle spasm  Abnormal posture     Problem List Patient Active Problem List   Diagnosis Date Noted  . Superior labrum anterior-to-posterior (SLAP) tear of left shoulder   . Subacromial bursitis of left shoulder joint   . Impingement syndrome of left  shoulder   . Labral tear of shoulder 08/19/2020  . Numbness and tingling of leg 08/19/2020  . Hemorrhoids 08/19/2020  . Hypertension 04/03/2020  . 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 Generalized anxiety disorder 01/30/2020  . Flank pain 01/10/2020  . Healthcare maintenance 01/10/2020  . Fibromyalgia 01/10/2020  . Hyperlipemia 01/10/2020  . GERD (gastroesophageal reflux disease) 12/18/2019  . HIV disease ([place]   . Opioid dependence with opioid-induced mood disorder ([place]   . Major depressive disorder, recurrent severe without psychotic features ([facility] 10/13/2019    [facility] [place] [address]Phone: [phone]  Fax:  [phone] Name: MEKAM BESSONMRN: [id number] of Birth: [birth year]  PHYSICAL THERAPY DISCHARGE SUMMARY  Visits from Start of Care: 2  Current functional level related to goals / functional outcomes: Pt has not progressed through his rehab protocols due to [place]   Remaining deficits: L shoulder gross AROM and Strength not WNL   Education / Equipment: See note  Plan: Patient agrees to discharge.  Patient goals were not met. Patient is being discharged due to the patient's request.  ?????          YVanessa Perth PT, DPT 11/12/20 6:36 PM

## 2020-11-17 ENCOUNTER — Encounter: Payer: Self-pay | Admitting: Neurology

## 2020-11-17 ENCOUNTER — Telehealth: Payer: Self-pay

## 2020-11-17 NOTE — Telephone Encounter (Signed)
FYI

## 2020-11-17 NOTE — Telephone Encounter (Signed)
FYI patient called he stated he will be out of town for the next 3 months and will be back in town September 2, patient had surgery 5/11 f/u appointment has been scheduled for september 6,2022 call back:[phone]

## 2020-11-21 ENCOUNTER — Ambulatory Visit: Admit: 2020-11-21 | Payer: Self-pay | Admitting: Orthopedic Surgery

## 2020-11-21 SURGERY — SHOULDER ARTHROSCOPY WITH SUBACROMIAL DECOMPRESSION
Anesthesia: General | Laterality: Left

## 2020-11-25 ENCOUNTER — Encounter: Payer: Self-pay | Admitting: Physical Therapy

## 2020-11-27 ENCOUNTER — Encounter: Payer: Self-pay | Admitting: Physical Therapy

## 2020-12-02 ENCOUNTER — Other Ambulatory Visit: Payer: Self-pay | Admitting: Student

## 2020-12-02 DIAGNOSIS — I10 Essential (primary) hypertension: Secondary | ICD-10-CM

## 2020-12-03 ENCOUNTER — Encounter: Payer: Self-pay | Admitting: Orthopaedic Surgery

## 2020-12-16 ENCOUNTER — Encounter: Payer: Self-pay | Admitting: *Deleted

## 2020-12-24 ENCOUNTER — Ambulatory Visit: Payer: Self-pay

## 2020-12-24 ENCOUNTER — Other Ambulatory Visit: Payer: Self-pay

## 2020-12-30 ENCOUNTER — Other Ambulatory Visit: Payer: Self-pay | Admitting: Infectious Diseases

## 2020-12-30 DIAGNOSIS — K219 Gastro-esophageal reflux disease without esophagitis: Secondary | ICD-10-CM

## 2021-01-02 ENCOUNTER — Telehealth: Payer: Self-pay

## 2021-01-02 NOTE — Telephone Encounter (Signed)
Patient left voicemail requesting that his fluoxetine dosage be increased for his depression.   RN called number that patient left and spoke with nurse at his treatment facility. She states the medication will need to go to [facility] in [place], [place] [phone]).   Patient left phone number [phone] to be reached at.   Will route to provider.   Sandie Ano, RN

## 2021-01-09 ENCOUNTER — Other Ambulatory Visit: Payer: Self-pay | Admitting: Infectious Diseases

## 2021-01-09 DIAGNOSIS — F339 Major depressive disorder, recurrent, unspecified: Secondary | ICD-10-CM

## 2021-01-09 MED ORDER — FLUOXETINE HCL 40 MG PO CAPS: 40.0000 mg | ORAL_CAPSULE | Freq: Every day | ORAL | 3 refills | Status: DC

## 2021-01-09 NOTE — Telephone Encounter (Signed)
Notified Hector Brunswick, RN at patient's treatment facility, that his requested medication was sent to [facility] per patient request.   Sandie Ano, RN

## 2021-01-21 ENCOUNTER — Encounter: Payer: Self-pay | Admitting: Neurology

## 2021-01-23 ENCOUNTER — Encounter: Payer: Self-pay | Admitting: Neurology

## 2021-01-26 ENCOUNTER — Other Ambulatory Visit: Payer: Self-pay | Admitting: Student

## 2021-01-26 DIAGNOSIS — F332 Major depressive disorder, recurrent severe without psychotic features: Secondary | ICD-10-CM

## 2021-02-02 ENCOUNTER — Encounter: Payer: Self-pay | Admitting: Infectious Diseases

## 2021-02-17 ENCOUNTER — Ambulatory Visit (INDEPENDENT_AMBULATORY_CARE_PROVIDER_SITE_OTHER): Payer: Self-pay | Admitting: Orthopaedic Surgery

## 2021-02-17 ENCOUNTER — Encounter: Payer: Self-pay | Admitting: Orthopaedic Surgery

## 2021-02-17 ENCOUNTER — Other Ambulatory Visit: Payer: Self-pay

## 2021-02-17 ENCOUNTER — Other Ambulatory Visit: Payer: Self-pay | Admitting: Student

## 2021-02-17 DIAGNOSIS — M797 Fibromyalgia: Secondary | ICD-10-CM

## 2021-02-17 DIAGNOSIS — Z9889 Other specified postprocedural states: Secondary | ICD-10-CM

## 2021-02-17 NOTE — Progress Notes (Signed)
Post-Op Visit Note   Patient: Jimmy Fisher           Date of Birth: 1980/10/12           MRN: [id number] Visit Date: 02/17/2021 PCP: Evlyn Kanner, MD   Assessment & Plan:  Chief Complaint:  Chief Complaint  Patient presents with   Left Shoulder - Follow-up   Visit Diagnoses:  1. S/P arthroscopy of left shoulder     Plan: Patient is a pleasant 40 year old gentleman who comes in today approximately 4 months status post left shoulder arthroscopic debridement, decompression and biceps tenodesis, date of surgery 10/22/2020.  He has been doing okay.  He notes that he was playing football about 6 weeks after surgery and went to make a move causing increased pain to the left shoulder.  This subsided.  He had increased pain to the posterior shoulder after trying to catch a drink after it fell.  His symptoms then improved within a few days.  He is taking Tylenol and ibuprofen as needed.  He did attend a few physical therapy sessions and was provided home exercise program thereafter as he moved to [place] for 3 months.  Examination of the left shoulder reveals full active range of motion all planes.  Negative empty can.  Negative speeds test.  He has full strength throughout.  He is neurovascular intact distally.  At this point, he will continue to increase activity as tolerated.  Follow-up with [place] as needed.  Call with concerns or questions in the meantime.  Follow-Up Instructions: Return if symptoms worsen or fail to improve.   Orders:  No orders of the defined types were placed in this encounter.  No orders of the defined types were placed in this encounter.   Imaging: No new imaging  PMFS History: Patient Active Problem List   Diagnosis Date Noted   Superior labrum anterior-to-posterior (SLAP) tear of left shoulder    Subacromial bursitis of left shoulder joint    Impingement syndrome of left shoulder    Labral tear of shoulder 08/19/2020   Numbness and tingling of leg  08/19/2020   Hemorrhoids 08/19/2020   Hypertension 04/03/2020   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   Generalized anxiety disorder 01/30/2020   Flank pain 01/10/2020   Healthcare maintenance 01/10/2020   Fibromyalgia 01/10/2020   Hyperlipemia 01/10/2020   GERD (gastroesophageal reflux disease) 12/18/2019   HIV disease (HCC)    Opioid dependence with opioid-induced mood disorder (HCC)    Major depressive disorder, 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/2019   Past Medical History:  Diagnosis Date   Anxiety    Depression    DVT (deep venous thrombosis) (HCC)    occurred during hospitalization for overdose   GERD (gastroesophageal reflux disease)    Hiatal hernia    HIV (human immunodeficiency virus infection) (HCC)    Hypercholesteremia    Hypertension    Neuromuscular disorder (HCC)    fibromyalgia   Pulmonary embolism (HCC)    2012   SLAP tear of shoulder    left    History reviewed. No pertinent family history.  Past Surgical History:  Procedure Laterality Date   COLONOSCOPY     I & D EXTREMITY Left 07/16/2020   Procedure: IRRIGATION AND DEBRIDEMENT FOREARM;  Surgeon: Bradly Bienenstock, MD;  Location: [facility] OR;  Service: Orthopedics;  Laterality: Left;   SHOULDER ARTHROSCOPY WITH BICEPS TENDON REPAIR Left 10/22/2020   Procedure: LEFT SHOULDER ARTHROSCOPY, BICEPS TENODESIS;  Surgeon: Tarry Kos, MD;  Location: [facility];  Service: Orthopedics;  Laterality: Left;   SUBACROMIAL DECOMPRESSION Left 10/22/2020   Procedure: SUBACROMIAL DECOMPRESSION;  Surgeon: Tarry Kos, MD;  Location: [facility];  Service: Orthopedics;  Laterality: Left;   UPPER GI ENDOSCOPY     Social History   Occupational History   Not on file  Tobacco Use   Smoking status: Every Day    Types: E-cigarettes   Smokeless tobacco: Former    Types: Snuff  Vaping Use   Vaping Use: Every day   Substances: Nicotine  Substance and Sexual Activity   Alcohol use: Not  Currently    Comment: quit 06/2020   Drug use: Not on file    Comment: feb 2022   Sexual activity: Not on file

## 2021-02-19 ENCOUNTER — Ambulatory Visit (INDEPENDENT_AMBULATORY_CARE_PROVIDER_SITE_OTHER): Payer: Self-pay | Admitting: Internal Medicine

## 2021-02-19 VITALS — BP 117/79 | HR 91 | Wt 202.3 lb

## 2021-02-19 DIAGNOSIS — S43432D Superior glenoid labrum lesion of left shoulder, subsequent encounter: Secondary | ICD-10-CM

## 2021-02-19 DIAGNOSIS — M797 Fibromyalgia: Secondary | ICD-10-CM

## 2021-02-19 DIAGNOSIS — M2550 Pain in unspecified joint: Secondary | ICD-10-CM

## 2021-02-19 DIAGNOSIS — F909 Attention-deficit hyperactivity disorder, unspecified type: Secondary | ICD-10-CM

## 2021-02-19 DIAGNOSIS — F411 Generalized anxiety disorder: Secondary | ICD-10-CM

## 2021-02-19 MED ORDER — PREGABALIN 75 MG PO CAPS: 75.0000 mg | ORAL_CAPSULE | Freq: Two times a day (BID) | ORAL | 0 refills | Status: DC

## 2021-02-19 NOTE — Progress Notes (Signed)
   CC: Medications, joint pain  HPI:  Jimmy Fisher is a 40 y.o. person, with a PMH noted below, who presents to the clinic for follow-up on Medications bilateral joint pain. To see the management of their acute and chronic conditions, please see the A&P note under the Encounters tab.   Past Medical History:  Diagnosis Date   Anxiety    Depression    DVT (deep venous thrombosis) (HCC)    occurred during hospitalization for overdose   GERD (gastroesophageal reflux disease)    Hiatal hernia    HIV (human immunodeficiency virus infection) (HCC)    Hypercholesteremia    Hypertension    Neuromuscular disorder (HCC)    fibromyalgia   Pulmonary embolism (HCC)    2012   SLAP tear of shoulder    left   Review of Systems:   Review of Systems  Constitutional:  Positive for malaise/fatigue. Negative for chills, diaphoresis, fever and weight loss.  Respiratory:  Negative for cough, sputum production, shortness of breath and wheezing.   Cardiovascular:  Negative for chest pain and palpitations.  Gastrointestinal:  Negative for abdominal pain, nausea and vomiting.  Musculoskeletal:  Positive for joint pain. Negative for back pain and falls.       Endorses bilateral hand pain, hip pain, and knee pain  Skin:  Negative for rash.    Physical Exam:  Vitals:   02/19/21 1507  BP: 117/79  Pulse: 91  SpO2: 99%  Weight: 202 lb 4.8 oz (91.8 kg)   Physical Exam Vitals reviewed.  Constitutional:      General: He is not in acute distress.    Appearance: Normal appearance. He is not ill-appearing or toxic-appearing.  Eyes:     General:        Right eye: No discharge.        Left eye: No discharge.     Conjunctiva/sclera: Conjunctivae normal.  Cardiovascular:     Rate and Rhythm: Normal rate and regular rhythm.     Pulses: Normal pulses.     Heart sounds: Normal heart sounds. No murmur heard.   No friction rub. No gallop.  Pulmonary:     Effort: Pulmonary effort is normal.      Breath sounds: Normal breath sounds. No wheezing, rhonchi or rales.  Skin:    Findings: No erythema or rash.  Neurological:     General: No focal deficit present.     Mental Status: He is alert and oriented to person, place, and time.  Psychiatric:        Mood and Affect: Mood normal.        Behavior: Behavior normal.     Assessment & Plan:   See Encounters Tab for problem based charting.  Patient discussed with Dr. Criselda Peaches

## 2021-02-19 NOTE — Patient Instructions (Addendum)
Jimmy Fisher,   It was a pleasure seeing you this afternoon! Today we discussed your fibromyalgia and joint pain. I will put in some blood work today to help [place] rule in/out rheumatoid arthritis. I will additionally order a hip xray as well to check for inflammation for your hip pain. I have also refilled your Lyrica. Lastly I will also refer you over to our behavioral health counselor, who will reach out to schedule an appointment for you. Have a great day!  Dolan Amen, MD

## 2021-02-20 ENCOUNTER — Ambulatory Visit: Payer: Self-pay | Admitting: Neurology

## 2021-02-20 DIAGNOSIS — F909 Attention-deficit hyperactivity disorder, unspecified type: Secondary | ICD-10-CM | POA: Insufficient documentation

## 2021-02-20 NOTE — Assessment & Plan Note (Addendum)
Patient with history of fibromyalgia presents to clinic with polyarthralgia.  Patient states that he has been having bilateral hand pain, that affects the middle joints of the fingers, that caused him to be stiff in the morning and takes generally about an hour for them to have their full range of motion.  He additionally states he is having bilateral hip pain, and knee pain.  He comes with concerns that this does not appear to be associated with classical fibromyalgia that he is read about.  Assessment: Patient with history of cervalgia presenting today with bilateral polyarthralgia of the fingers affecting the PIP and MCP joints bilaterally, morning stiffness that takes approximately an hour to be alleviated, with some alleviation in warm water.  His presentation is concerning for rheumatoid arthritis, rheumatological disease which we will rule out today.  Additionally with his hip pain and the patient's age there are concerns for possible ankylosing spondylitis.  We will start basic rheumatological work-up today and imaging.  We will additionally obtain CRP and ESR to help differentiate between osteoarthritis versus inflammatory. - Obtain CRP, ESR, anti-CCP, RF - Obtain bilateral hip, pelvis, lumbar spine x-ray  ADDENDUM:  Spoke with patient regarding negative lab results.

## 2021-02-20 NOTE — Assessment & Plan Note (Addendum)
Status post shoulder arthroscopy with debridement in 10/23/2018 shoulder pain well controlled. - Following with orthopedics as needed

## 2021-02-20 NOTE — Assessment & Plan Note (Addendum)
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  We will have him see Dr. Sallyanne Kuster. - Ambulatory referral to [facility].

## 2021-02-20 NOTE — Assessment & Plan Note (Addendum)
Patient has returned to [facility] after extended stay at a rehabilitation in [place]. During his time in [place] he was unable to get Lyrica which is why it was discontinued.  He states that because of his fibromyalgia, he was only able to take Tylenol and ibuprofen which did not alleviate his symptoms. He would like to restart his Lyrica at this time. He was taking his amitriptyline, which provided little effect and he self discontinued and is not interested in restarting at this time.  He has trialed Cymbalta in the past which helped for him.  He states that Lyrica did help but not completely alleviate his pain.We will restart Lyrica today and have him follow-up.  He was started on a low-dose of amitriptyline, so if pain is not well controlled on Lyrica, could consider revisiting increased amitriptyline dose. - Refill Lyrica 75 mg twice daily - Follow-up in 4 weeks  ADDENDUM:  Spoke with patient regarding Lyrica, He was last on 150 mg twice daily which helped alleviate his symptoms. Will discontinue 75 mg BID and place for 150 mg BID.

## 2021-02-20 NOTE — Assessment & Plan Note (Signed)
At the end of the patient encounter, patient asked about restarting ADHD medications. States that he had ADHD as a child and was medicated, and is worried that his ADHD may be contributing to his recent rehab stay. This will need to be reevaluated at his next follow-up appointment and will place an order for behavioral health see him. - Ambulatory referral to behavioral health - Discussed at next clinical visit

## 2021-02-21 LAB — SEDIMENTATION RATE: Sed Rate: 2 mm/hr (ref 0–15)

## 2021-02-21 LAB — RHEUMATOID FACTOR: Rheumatoid fact SerPl-aCnc: <10 IU/mL (ref <14.0)

## 2021-02-21 LAB — C-REACTIVE PROTEIN: CRP: 1 mg/L (ref 0–10)

## 2021-02-21 LAB — CYCLIC CITRUL PEPTIDE ANTIBODY, IGG/IGA: Cyclic Citrullin Peptide Ab: 4 units (ref 0–19)

## 2021-02-23 ENCOUNTER — Telehealth: Payer: Self-pay

## 2021-02-23 ENCOUNTER — Other Ambulatory Visit: Payer: Self-pay | Admitting: Infectious Diseases

## 2021-02-23 ENCOUNTER — Telehealth: Payer: Self-pay | Admitting: *Deleted

## 2021-02-23 DIAGNOSIS — B20 Human immunodeficiency virus [HIV] disease: Secondary | ICD-10-CM

## 2021-02-23 NOTE — Telephone Encounter (Signed)
Patient called to report difficulties filling his Lyrica. States his PCP prescribed it using [employer] but the pharmacy is stating it's not covered. RN called pharmacy who states that it's covered but it's being marked as "too soon to fill". Prescription was sent on 9/8; RN called patient back to see if he's picked that up but had to leave voicemail. Pharmacist at [facility] stated he would contact the state to reverse the claim so patient can receive his medication as he reportedly has not picked it up. RN left VM requesting return call to patient.  Dema Timmons [place] Mast, RN

## 2021-02-23 NOTE — Telephone Encounter (Signed)
Pt recently seen by [facility] Given rx for lyrica 75mg  twice daily  States he was on lyrica 150mg  twice daily in the past and requests rx Also states [employer] would not pay for rx "although it was on the list of approved meds" (of note, states amitriptyline does not work-not taking)  Will send refill request to ordering MD for review, pt will look into [employer] issue

## 2021-02-24 NOTE — Telephone Encounter (Signed)
Contacted patient to follow up on lyrica prescription. Patient reports that he hasn't filled prescription since December. States that he would call pharmacy to see if they were able to get claim reversed.   Darshawn Boateng [place] Mast, RN

## 2021-02-24 NOTE — Progress Notes (Signed)
Internal Medicine Clinic Attending ? ?Case discussed with Dr. Winters  At the time of the visit.  We reviewed the resident?s history and exam and pertinent patient test results.  I agree with the assessment, diagnosis, and plan of care documented in the resident?s note.  ?

## 2021-02-26 ENCOUNTER — Encounter: Payer: Self-pay | Admitting: Internal Medicine

## 2021-02-26 MED ORDER — PREGABALIN 150 MG PO CAPS: 150.0000 mg | ORAL_CAPSULE | Freq: Two times a day (BID) | ORAL | 2 refills | Status: DC

## 2021-02-26 NOTE — Addendum Note (Signed)
Addended by: Dolan Amen C on: 02/26/2021 04:11 PM   Modules accepted: Orders

## 2021-02-26 NOTE — Addendum Note (Signed)
Addended by: Dolan Amen C on: 02/26/2021 04:09 PM   Modules accepted: Orders

## 2021-03-02 ENCOUNTER — Encounter: Payer: Self-pay | Admitting: Student

## 2021-03-05 ENCOUNTER — Other Ambulatory Visit: Payer: Self-pay

## 2021-03-05 ENCOUNTER — Encounter: Payer: Self-pay | Admitting: Neurology

## 2021-03-05 ENCOUNTER — Ambulatory Visit (INDEPENDENT_AMBULATORY_CARE_PROVIDER_SITE_OTHER): Payer: Self-pay | Admitting: Neurology

## 2021-03-05 VITALS — BP 137/90 | HR 85 | Ht 66.0 in | Wt 203.0 lb

## 2021-03-05 DIAGNOSIS — G5712 Meralgia paresthetica, left lower limb: Secondary | ICD-10-CM

## 2021-03-05 NOTE — Patient Instructions (Signed)
Your symptoms are most consistent with meralgia paresthetica.  If the tingling gets worse, you may try over the counter lidocaine ointment.
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
## 2021-03-11 ENCOUNTER — Other Ambulatory Visit: Payer: Self-pay

## 2021-03-11 DIAGNOSIS — F339 Major depressive disorder, recurrent, unspecified: Secondary | ICD-10-CM

## 2021-03-11 MED ORDER — FLUOXETINE HCL 40 MG PO CAPS: 40.0000 mg | ORAL_CAPSULE | Freq: Every day | ORAL | 0 refills | Status: DC

## 2021-03-19 ENCOUNTER — Encounter: Payer: Self-pay | Admitting: Student

## 2021-03-19 ENCOUNTER — Other Ambulatory Visit: Payer: Self-pay

## 2021-03-19 ENCOUNTER — Ambulatory Visit (INDEPENDENT_AMBULATORY_CARE_PROVIDER_SITE_OTHER): Payer: Self-pay | Admitting: Student

## 2021-03-19 ENCOUNTER — Ambulatory Visit (HOSPITAL_COMMUNITY)
Admission: RE | Admit: 2021-03-19 | Discharge: 2021-03-19 | Disposition: A | Discharge status: Home / Self Care | Payer: Self-pay | Source: Ambulatory Visit | Attending: Internal Medicine | Admitting: Internal Medicine

## 2021-03-19 DIAGNOSIS — M2550 Pain in unspecified joint: Secondary | ICD-10-CM | POA: Insufficient documentation

## 2021-03-19 DIAGNOSIS — M797 Fibromyalgia: Secondary | ICD-10-CM

## 2021-03-19 MED ORDER — PREGABALIN 75 MG PO CAPS: 75.0000 mg | ORAL_CAPSULE | Freq: Two times a day (BID) | ORAL | 5 refills | Status: DC

## 2021-03-19 NOTE — Progress Notes (Signed)
   CC: Follow-up  HPI:  [name] is a 40 y.o. male with PMH as below here for follow-up on all his fibromyalgia. Please see problem based charting for evaluation, assessment and plan.  Past Medical History:  Diagnosis Date   Anxiety    Depression    DVT (deep venous thrombosis) (HCC)    occurred during hospitalization for overdose   Fibromyalgia    GERD (gastroesophageal reflux disease)    Hiatal hernia    HIV (human immunodeficiency virus infection) (HCC)    Hypercholesteremia    Hypertension    Neuromuscular disorder (HCC)    fibromyalgia   Pulmonary embolism (HCC)    2012   SLAP tear of shoulder    left    Review of Systems:  Constitutional: Negative for fever or fatigue.  Positive for occasional dizziness. Eyes: Positive for blurry vision Respiratory: Negative for shortness of breath Cardiac: Negative for chest pain MSK: Negative for back pain or neck pain. Positive for hand pain. Abdomen: Negative for abdominal pain, constipation or diarrhea Neuro: Negative for headache or weakness  Physical Exam: General: Pleasant, well-appearing middle-age man.  No acute distress. HEENT: MMM. PERRLA. EOMI Cardiac: RRR. No murmurs, rubs or gallops. No LE edema Respiratory: Lungs CTAB. No wheezing or crackles. Abdominal: Soft, symmetric and non tender. Normal BS. Skin: Warm, dry and intact without rashes or lesions Extremities: Atraumatic. Full ROM. Palpable radial and DP pulses. Neuro: A&O x 3. Moves all extremities. Normal sensation. No focal deficits. Psych: Appropriate mood and affect.  Vitals:   03/19/21 1527  BP: 110/78  Pulse: 85  SpO2: 97%  Weight: 208 lb (94.3 kg)  Height: 5\' 6"  (1.676 m)     Assessment & Plan:   See Encounters Tab for problem based charting.  Patient discussed with Dr.  , MD, MPH;in

## 2021-03-19 NOTE — Patient Instructions (Addendum)
Thank you, Mr.Auston A Holwerda for allowing [place] to provide your care today. Today we discussed your fibromyalgia.    I have ordered the following medication/changed the following medications:  Decrease your Lyrica to 75 mg twice daily  My Chart Access: [email or web address]  Please follow-up in 2-3 weeks  Please make sure to arrive 15 minutes prior to your next appointment. If you arrive late, you may be asked to reschedule.    We look forward to seeing you next time. Please call our clinic at [phone] if you have any questions or concerns. The best time to call is Monday-Friday from 9am-4pm, but there is someone available 24/7. If after hours or the weekend, call the main hospital number and ask for the Internal Medicine Resident On-Call. If you need medication refills, please notify your pharmacy one week in advance and they will send [place] a request.   Thank you for letting us take part in your care. Wishing you the best!  Steffanie Rainwater, MD 03/19/2021, 4:07 PM IM Resident, PGY-2 Duwayne Heck 41:10

## 2021-03-20 ENCOUNTER — Encounter: Payer: Self-pay | Admitting: Student

## 2021-03-20 NOTE — Progress Notes (Signed)
Internal Medicine Clinic Attending ° °Case discussed with Dr. Amponsah  at the time of the visit.  We reviewed the resident’s history and exam and pertinent patient test results.  I agree with the assessment, diagnosis, and plan of care documented in the resident’s note.  °

## 2021-03-20 NOTE — Assessment & Plan Note (Signed)
Patient with a history of fibromyalgia not responsive to amitriptyline and gabapentin in the past.  He was recently started on a trial of Lyrica as this has helped him in the past.  Initially prescribed 75 mg twice a day which was increased to 150 mg twice a day at the request of patient.  Patient states she has had increase blurry vision and dizziness on the increased dose of the Lyrica.  He believes he was actually on 75 mg twice a day in the past and not 150.  Patient willing to try a low-dose of Lyrica for another 2 weeks and decide if he wants to continue taking it. He continues to have pain in his bilateral hands that he describes as dull with associated stiffness in his fingers. Patient reports family history of arthritis and rheumatological work-up has been negative so far.  Plan: -- Discontinue Lyrica 150 mg twice daily -- Restart Lyrica 75 mg twice daily -- Pending imaging to evaluate for possible spinal cause of his symptoms -- Follow-up in 2 to 4 weeks.

## 2021-03-24 ENCOUNTER — Institutional Professional Consult (permissible substitution): Payer: Self-pay | Admitting: Behavioral Health

## 2021-03-24 ENCOUNTER — Telehealth: Payer: Self-pay | Admitting: Orthopaedic Surgery

## 2021-03-24 ENCOUNTER — Telehealth: Payer: Self-pay | Admitting: Behavioral Health

## 2021-03-24 NOTE — Telephone Encounter (Signed)
Needs to be seen to get xrays.
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
## 2021-03-24 NOTE — Telephone Encounter (Signed)
Lft msgs for Pt about today's 10:00am [facility]BH telehealth session. Directed Pt to call [facility] & r/s @ his convenience.  Dr. Monna Fam

## 2021-03-25 ENCOUNTER — Ambulatory Visit: Payer: Self-pay | Admitting: Behavioral Health

## 2021-03-25 DIAGNOSIS — F331 Major depressive disorder, recurrent, moderate: Secondary | ICD-10-CM

## 2021-03-25 DIAGNOSIS — F901 Attention-deficit hyperactivity disorder, predominantly hyperactive type: Secondary | ICD-10-CM

## 2021-03-25 DIAGNOSIS — F411 Generalized anxiety disorder: Secondary | ICD-10-CM
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
## 2021-03-26 ENCOUNTER — Other Ambulatory Visit: Payer: Self-pay

## 2021-03-26 ENCOUNTER — Ambulatory Visit (INDEPENDENT_AMBULATORY_CARE_PROVIDER_SITE_OTHER): Payer: Self-pay | Admitting: Orthopaedic Surgery

## 2021-03-26 ENCOUNTER — Ambulatory Visit: Payer: Self-pay

## 2021-03-26 ENCOUNTER — Encounter: Payer: Self-pay | Admitting: Orthopaedic Surgery

## 2021-03-26 VITALS — Ht 66.0 in | Wt 208.0 lb

## 2021-03-26 DIAGNOSIS — M25551 Pain in right hip: Secondary | ICD-10-CM

## 2021-03-26 DIAGNOSIS — M25512 Pain in left shoulder: Secondary | ICD-10-CM

## 2021-03-26 MED ORDER — TRAMADOL HCL 50 MG PO TABS: 50.0000 mg | ORAL_TABLET | Freq: Three times a day (TID) | ORAL | 0 refills | Status: DC | PRN

## 2021-03-26 MED ORDER — METHOCARBAMOL 500 MG PO TABS: 500.0000 mg | ORAL_TABLET | Freq: Two times a day (BID) | ORAL | 0 refills | Status: DC | PRN

## 2021-03-26 MED ORDER — PREDNISONE 10 MG (21) PO TBPK: ORAL_TABLET | ORAL | 0 refills | Status: DC
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
## 2021-04-14 ENCOUNTER — Telehealth: Payer: Self-pay | Admitting: Behavioral Health

## 2021-04-14 ENCOUNTER — Ambulatory Visit: Payer: Self-pay | Admitting: Behavioral Health

## 2021-04-14 NOTE — Telephone Encounter (Signed)
Lft several msgs for Pt re: today's telehealth session f/u appt. Requested Pt RC to [facility] & r/s @ his convenience.  Dr. Monna Fam

## 2021-04-20 ENCOUNTER — Other Ambulatory Visit: Payer: Self-pay | Admitting: Infectious Diseases

## 2021-04-20 DIAGNOSIS — B20 Human immunodeficiency virus [HIV] disease: Secondary | ICD-10-CM

## 2021-04-27 ENCOUNTER — Telehealth: Payer: Self-pay

## 2021-04-27 ENCOUNTER — Other Ambulatory Visit (HOSPITAL_COMMUNITY): Payer: Self-pay

## 2021-04-27 NOTE — Telephone Encounter (Signed)
[facility] Patient Advocate Encounter °  °Was successful in obtaining a [employer] copay card for Biktarvy.  This copay card will make the patients copay 0.00. ° °I have spoken with the patient.   ° °The billing information is as follows and has been shared with [facility]. ° ° ° ° ° ° ° °Deshannon Seide, CPhT °Specialty Pharmacy Patient Advocate °[facility] °Phone: [phone] °Fax:  [phone]  °

## 2021-04-30 ENCOUNTER — Ambulatory Visit: Payer: 59 | Admitting: Student

## 2021-04-30 ENCOUNTER — Other Ambulatory Visit (HOSPITAL_COMMUNITY): Payer: Self-pay

## 2021-04-30 ENCOUNTER — Other Ambulatory Visit: Payer: Self-pay

## 2021-04-30 ENCOUNTER — Encounter: Payer: Self-pay | Admitting: Student

## 2021-04-30 DIAGNOSIS — Z79899 Other long term (current) drug therapy: Secondary | ICD-10-CM

## 2021-04-30 DIAGNOSIS — K219 Gastro-esophageal reflux disease without esophagitis: Secondary | ICD-10-CM

## 2021-04-30 DIAGNOSIS — E782 Mixed hyperlipidemia: Secondary | ICD-10-CM

## 2021-04-30 DIAGNOSIS — B20 Human immunodeficiency virus [HIV] disease: Secondary | ICD-10-CM

## 2021-04-30 DIAGNOSIS — I10 Essential (primary) hypertension: Secondary | ICD-10-CM | POA: Diagnosis not present

## 2021-04-30 DIAGNOSIS — Z113 Encounter for screening for infections with a predominantly sexual mode of transmission: Secondary | ICD-10-CM

## 2021-04-30 DIAGNOSIS — F339 Major depressive disorder, recurrent, unspecified: Secondary | ICD-10-CM

## 2021-04-30 MED ORDER — OMEPRAZOLE 20 MG PO CPDR
DELAYED_RELEASE_CAPSULE | ORAL | 3 refills | Status: DC
Start: 2021-04-30 — End: 2021-05-27
  Filled 2021-04-30: qty 30, 30d supply, fill #0

## 2021-04-30 MED ORDER — FLUOXETINE HCL 40 MG PO CAPS
40.0000 mg | ORAL_CAPSULE | Freq: Every day | ORAL | 3 refills | Status: DC
Start: 2021-04-30 — End: 2022-01-15
  Filled 2021-04-30: qty 30, 30d supply, fill #0
  Filled 2021-07-02: qty 30, 30d supply, fill #1
  Filled 2021-07-30: qty 30, 30d supply, fill #2
  Filled 2021-08-29: qty 30, 30d supply, fill #3
  Filled 2021-09-28: qty 30, 30d supply, fill #4
  Filled 2021-10-23: qty 30, 30d supply, fill #5
  Filled 2021-11-23: qty 30, 30d supply, fill #6
  Filled 2021-12-21: qty 30, 30d supply, fill #7

## 2021-04-30 NOTE — Patient Instructions (Signed)
Jimmy Fisher, it was a pleasure seeing you today!  Today we discussed: - Blood pressure: Please hold off taking ibuprofen. Continue checking your blood pressure over the next few weeks. If it is still elevated, please come back to clinic.  Follow-up: 3 months   Please make sure to arrive 15 minutes prior to your next appointment. If you arrive late, you may be asked to reschedule.   We look forward to seeing you next time. Please call our clinic at [phone] if you have any questions or concerns. The best time to call is Monday-Friday from 9am-4pm, but there is someone available 24/7. If after hours or the weekend, call the main hospital number and ask for the Internal Medicine Resident On-Call. If you need medication refills, please notify your pharmacy one week in advance and they will send [place] a request.  Thank you for letting us take part in your care. Wishing you the best!  Thank you, Evlyn Kanner, MD

## 2021-05-01 ENCOUNTER — Other Ambulatory Visit (HOSPITAL_COMMUNITY): Payer: Self-pay

## 2021-05-03 NOTE — Assessment & Plan Note (Addendum)
Last lipid panel 09/2020 listed below:  Lab Results  Component Value Date   CHOL 235 (H) 10/06/2020   HDL 34 (L) 10/06/2020   LDLCALC 143 (H) 10/06/2020   TRIG 374 (H) 10/06/2020   CHOLHDL 6.9 (H) 10/06/2020   Current ASCVD 10-year risk 3.3%, will hold off treatment. Partially increased lipids could be 2/2 Biktarvy. Counseled patient on ways to reduce cholesterol. Will continue checking lipid panel per ID protocol.

## 2021-05-03 NOTE — Progress Notes (Signed)
   CC: refills, hypertension  HPI:  [name] is a 40 y.o. with well-controlled HIV on Biktarvy, fibromyalgia, generalized anxiety disorder, history of opioid use disorder presenting to [facility] for refills and to discuss blood pressure.  Please see problem-based list for further details.  Past Medical History:  Diagnosis Date   Anxiety    Depression    DVT (deep venous thrombosis) (HCC)    occurred during hospitalization for overdose   Fibromyalgia    GERD (gastroesophageal reflux disease)    Hiatal hernia    HIV (human immunodeficiency virus infection) (HCC)    Hypercholesteremia    Hypertension    Neuromuscular disorder (HCC)    fibromyalgia   Pulmonary embolism (HCC)    2012   SLAP tear of shoulder    left   Review of Systems:  As per HPI  Physical Exam:  Vitals:   04/30/21 1553  BP: (!) 129/96  Pulse: 87  Temp: 98 F (36.7 C)  TempSrc: Oral  SpO2: 98%  Weight: 207 lb 6.4 oz (94.1 kg)  Height: 5\' 6"  (1.676 m)   General: Resting comfortably in chair, no acute distress CV: Regular rate, rhythm. No murmurs appreciated. Extremities warm. Pulm: Normal work of breathing on room air. Clear to auscultation bilaterally. MSK: Normal bulk, tone. No pitting edema bilateral lower extremities. Neuro: Awake, alert, conversing appropriately. No focal deficits. Psych: Normal mood, affect, speech.  Assessment & Plan:   See Encounters Tab for problem based charting.  Patient discussed with Dr. 

## 2021-05-03 NOTE — Assessment & Plan Note (Signed)
BP Readings from Last 3 Encounters:  04/30/21 (!) 129/96  03/19/21 110/78  03/05/21 137/90   Blood pressure at goal today 129/96. Patient does report taking his blood pressure at home with systolic pressures often in 150's. He describes the process of taking his blood pressure by resting with both feet on the floor, resting for a few minutes, and having arm at the level of the heart. Denies alcohol or caffeine use prior. Upon further questioning, patient does mention taking 400mg  ibuprofen 2-3 times daily for pain. Discussed with patient that likely the NSAID he has been taking is contributing to his higher pressures. Discussed holding off NSAID and continuing to check BP. If BP continues to be elevated despite holding medication, I have instructed him to make an appointment with [facility]. Patient agreed to plan.  - Hold NSAID - Continue home BP monitoring - Return in three months

## 2021-05-04 ENCOUNTER — Other Ambulatory Visit: Payer: Self-pay | Admitting: Infectious Diseases

## 2021-05-04 DIAGNOSIS — B20 Human immunodeficiency virus [HIV] disease: Secondary | ICD-10-CM

## 2021-05-04 NOTE — Progress Notes (Signed)
Internal Medicine Clinic Attending ? ?Case discussed with Dr. Braswell  At the time of the visit.  We reviewed the resident?s history and exam and pertinent patient test results.  I agree with the assessment, diagnosis, and plan of care documented in the resident?s note.  ?

## 2021-05-05 ENCOUNTER — Ambulatory Visit: Payer: Self-pay | Admitting: Behavioral Health

## 2021-05-06 ENCOUNTER — Other Ambulatory Visit: Payer: Self-pay

## 2021-05-06 ENCOUNTER — Other Ambulatory Visit: Payer: 59

## 2021-05-06 ENCOUNTER — Other Ambulatory Visit (HOSPITAL_COMMUNITY)
Admission: RE | Admit: 2021-05-06 | Discharge: 2021-05-06 | Disposition: A | Discharge status: Home / Self Care | Payer: 59 | Source: Ambulatory Visit | Attending: Infectious Diseases | Admitting: Infectious Diseases

## 2021-05-06 DIAGNOSIS — B20 Human immunodeficiency virus [HIV] disease: Secondary | ICD-10-CM

## 2021-05-06 DIAGNOSIS — Z113 Encounter for screening for infections with a predominantly sexual mode of transmission: Secondary | ICD-10-CM

## 2021-05-06 DIAGNOSIS — Z79899 Other long term (current) drug therapy: Secondary | ICD-10-CM

## 2021-05-08 LAB — URINE CYTOLOGY ANCILLARY ONLY
Chlamydia: NEGATIVE
Comment: NEGATIVE
Comment: NORMAL
Neisseria Gonorrhea: NEGATIVE

## 2021-05-10 LAB — HIV-1 RNA QUANT-NO REFLEX-BLD
HIV 1 RNA Quant: 32 Copies/mL — ABNORMAL HIGH
HIV-1 RNA Quant, Log: 1.5 Log cps/mL — ABNORMAL HIGH

## 2021-05-10 LAB — T-HELPER CELLS (CD4) COUNT (NOT AT ARMC)
Absolute CD4: 1058 cells/uL (ref 490–1740)
CD4 T Helper %: 37 % (ref 30–61)
Total lymphocyte count: 2863 cells/uL (ref 850–3900)

## 2021-05-10 LAB — CBC WITH DIFFERENTIAL/PLATELET
Absolute Monocytes: 422 cells/uL (ref 200–950)
Basophils Absolute: 60 cells/uL (ref 0–200)
Basophils Relative: 0.9 %
Eosinophils Absolute: 174 cells/uL (ref 15–500)
Eosinophils Relative: 2.6 %
HCT: 45.1 % (ref 38.5–50.0)
Hemoglobin: 15.3 g/dL (ref 13.2–17.1)
Lymphs Abs: 2854 cells/uL (ref 850–3900)
MCH: 29.7 pg (ref 27.0–33.0)
MCHC: 33.9 g/dL (ref 32.0–36.0)
MCV: 87.4 fL (ref 80.0–100.0)
MPV: 8.8 fL (ref 7.5–12.5)
Monocytes Relative: 6.3 %
Neutro Abs: 3189 cells/uL (ref 1500–7800)
Neutrophils Relative %: 47.6 %
Platelets: 285 10*3/uL (ref 140–400)
RBC: 5.16 10*6/uL (ref 4.20–5.80)
RDW: 14.5 % (ref 11.0–15.0)
Total Lymphocyte: 42.6 %
WBC: 6.7 10*3/uL (ref 3.8–10.8)

## 2021-05-10 LAB — COMPREHENSIVE METABOLIC PANEL
AG Ratio: 2 (calc) (ref 1.0–2.5)
ALT: 31 U/L (ref 9–46)
AST: 21 U/L (ref 10–40)
Albumin: 4.5 g/dL (ref 3.6–5.1)
Alkaline phosphatase (APISO): 65 U/L (ref 36–130)
BUN: 12 mg/dL (ref 7–25)
CO2: 25 mmol/L (ref 20–32)
Calcium: 9.3 mg/dL (ref 8.6–10.3)
Chloride: 104 mmol/L (ref 98–110)
Creat: 1.18 mg/dL (ref 0.60–1.29)
Globulin: 2.2 g/dL (calc) (ref 1.9–3.7)
Glucose, Bld: 140 mg/dL — ABNORMAL HIGH (ref 65–99)
Potassium: 3.8 mmol/L (ref 3.5–5.3)
Sodium: 139 mmol/L (ref 135–146)
Total Bilirubin: 0.6 mg/dL (ref 0.2–1.2)
Total Protein: 6.7 g/dL (ref 6.1–8.1)

## 2021-05-10 LAB — RPR: RPR Ser Ql: NONREACTIVE

## 2021-05-10 LAB — LIPID PANEL
Cholesterol: 230 mg/dL — ABNORMAL HIGH (ref <200)
HDL: 39 mg/dL — ABNORMAL LOW (ref >=40)
LDL Cholesterol (Calc): 147 mg/dL (calc) — ABNORMAL HIGH
Non-HDL Cholesterol (Calc): 191 mg/dL (calc) — ABNORMAL HIGH (ref <130)
Total CHOL/HDL Ratio: 5.9 (calc) — ABNORMAL HIGH (ref <5.0)
Triglycerides: 268 mg/dL — ABNORMAL HIGH (ref <150)

## 2021-05-18 ENCOUNTER — Other Ambulatory Visit: Payer: Self-pay | Admitting: Infectious Diseases

## 2021-05-18 DIAGNOSIS — B20 Human immunodeficiency virus [HIV] disease: Secondary | ICD-10-CM

## 2021-05-21 ENCOUNTER — Ambulatory Visit: Payer: Self-pay | Admitting: Infectious Diseases

## 2021-05-26 ENCOUNTER — Other Ambulatory Visit: Payer: Self-pay | Admitting: Infectious Diseases

## 2021-05-26 ENCOUNTER — Encounter: Payer: Self-pay | Admitting: Infectious Diseases

## 2021-05-26 ENCOUNTER — Other Ambulatory Visit: Payer: Self-pay

## 2021-05-26 ENCOUNTER — Other Ambulatory Visit (HOSPITAL_COMMUNITY): Payer: Self-pay

## 2021-05-26 ENCOUNTER — Ambulatory Visit (INDEPENDENT_AMBULATORY_CARE_PROVIDER_SITE_OTHER): Payer: 59 | Admitting: Infectious Diseases

## 2021-05-26 DIAGNOSIS — I10 Essential (primary) hypertension: Secondary | ICD-10-CM

## 2021-05-26 DIAGNOSIS — B20 Human immunodeficiency virus [HIV] disease: Secondary | ICD-10-CM

## 2021-05-26 DIAGNOSIS — N522 Drug-induced erectile dysfunction: Secondary | ICD-10-CM

## 2021-05-26 DIAGNOSIS — R350 Frequency of micturition: Secondary | ICD-10-CM | POA: Insufficient documentation

## 2021-05-26 DIAGNOSIS — K219 Gastro-esophageal reflux disease without esophagitis: Secondary | ICD-10-CM

## 2021-05-26 DIAGNOSIS — N529 Male erectile dysfunction, unspecified: Secondary | ICD-10-CM | POA: Insufficient documentation

## 2021-05-26 DIAGNOSIS — F901 Attention-deficit hyperactivity disorder, predominantly hyperactive type: Secondary | ICD-10-CM

## 2021-05-26 MED ORDER — BENICAR 20 MG PO TABS
20.0000 mg | ORAL_TABLET | Freq: Every day | ORAL | 3 refills | Status: DC
  Filled 2021-05-26 – 2021-05-28 (×2): qty 30, 30d supply, fill #0

## 2021-05-26 MED ORDER — SILDENAFIL CITRATE 25 MG PO TABS
25.0000 mg | ORAL_TABLET | Freq: Every day | ORAL | 0 refills | Status: DC | PRN
  Filled 2021-05-26: qty 6, 30d supply, fill #0
  Filled 2021-07-02: qty 4, 30d supply, fill #1

## 2021-05-26 NOTE — Assessment & Plan Note (Signed)
Wife is on prep Needs flu and covid vax today Will meet with pharm about cabaneuva.  rtc in 6-8  weeks.

## 2021-05-26 NOTE — Assessment & Plan Note (Signed)
Feels like his rx has him well controled.  Encouraged him.

## 2021-05-26 NOTE — Assessment & Plan Note (Signed)
Asked him to try scheduled voiding, will see if BP rx helps.  If not, at  F/u, will send to uro.

## 2021-05-26 NOTE — Assessment & Plan Note (Signed)
Will restart his bp rx Recheck in 6-8 weeks.

## 2021-05-26 NOTE — Assessment & Plan Note (Signed)
He is going to change his PPI to BID to see if this improves.  Suspect due to wt If not improving, endo eval.
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
## 2021-05-26 NOTE — Assessment & Plan Note (Addendum)
Will send him rx for viagra Asked to call if erection lasting > 8 hrs Wife is on prep.  encouraged condom use.

## 2021-05-27 ENCOUNTER — Other Ambulatory Visit (HOSPITAL_COMMUNITY): Payer: Self-pay

## 2021-05-27 ENCOUNTER — Other Ambulatory Visit: Payer: Self-pay

## 2021-05-27 DIAGNOSIS — K219 Gastro-esophageal reflux disease without esophagitis: Secondary | ICD-10-CM

## 2021-05-27 MED ORDER — OMEPRAZOLE 20 MG PO CPDR
DELAYED_RELEASE_CAPSULE | ORAL | 0 refills | Status: DC
  Filled 2021-05-27: qty 60, 30d supply, fill #0
  Filled 2021-07-02: qty 60, 30d supply, fill #1
  Filled 2021-07-30: qty 60, 30d supply, fill #2

## 2021-05-28 ENCOUNTER — Other Ambulatory Visit (HOSPITAL_COMMUNITY): Payer: Self-pay

## 2021-05-28 ENCOUNTER — Other Ambulatory Visit: Payer: Self-pay | Admitting: Pharmacist

## 2021-05-28 DIAGNOSIS — I10 Essential (primary) hypertension: Secondary | ICD-10-CM

## 2021-05-28 MED ORDER — OLMESARTAN MEDOXOMIL 20 MG PO TABS
20.0000 mg | ORAL_TABLET | Freq: Every day | ORAL | 3 refills | Status: DC
  Filled 2021-05-28: qty 30, 30d supply, fill #0
  Filled 2021-07-02: qty 30, 30d supply, fill #1
  Filled 2021-07-30: qty 30, 30d supply, fill #2
  Filled 2021-08-29: qty 30, 30d supply, fill #3
  Filled 2021-09-28: qty 30, 30d supply, fill #4
  Filled 2021-10-23: qty 30, 30d supply, fill #5
  Filled 2021-11-23: qty 30, 30d supply, fill #6
  Filled 2021-12-21: qty 30, 30d supply, fill #7
  Filled 2022-01-15: qty 30, 30d supply, fill #8
  Filled 2022-02-15: qty 30, 30d supply, fill #9
  Filled 2022-03-15: qty 30, 30d supply, fill #10
  Filled 2022-04-10: qty 30, 30d supply, fill #11

## 2021-05-29 ENCOUNTER — Telehealth: Payer: Self-pay

## 2021-05-29 NOTE — Telephone Encounter (Signed)
[facility] Patient Advocate Encounter   Received notification from [employer] that prior authorization for Jimmy Fisher is required.   PA submitted on 05/29/21  Key [id number] Status is pending    [facility] Clinic will continue to follow.   Clearance Coots, CPhT Specialty Pharmacy Patient [facility] for Infectious Disease Phone: [phone] Fax:  [phone]

## 2021-06-01 ENCOUNTER — Telehealth: Payer: Self-pay

## 2021-06-01 NOTE — Telephone Encounter (Signed)
[facility] Patient Advocate Encounter   Received notification from [employer] that prior authorization for Jimmy Fisher is required.   PA submitted on 06/01/21 Key [id number] Status is pending  I faxed chart notes & labs to [phone]  Insurance # [id number]    [facility] Clinic will continue to follow.   Clearance Coots, CPhT Specialty Pharmacy Patient [facility] for Infectious Disease Phone: [phone] Fax:  [phone]

## 2021-06-17 ENCOUNTER — Other Ambulatory Visit: Payer: Self-pay

## 2021-06-17 ENCOUNTER — Encounter: Payer: Self-pay | Admitting: Infectious Diseases

## 2021-06-17 DIAGNOSIS — B20 Human immunodeficiency virus [HIV] disease: Secondary | ICD-10-CM

## 2021-06-19 ENCOUNTER — Other Ambulatory Visit: Payer: Self-pay

## 2021-06-19 ENCOUNTER — Other Ambulatory Visit (HOSPITAL_COMMUNITY): Payer: Self-pay

## 2021-06-19 DIAGNOSIS — B20 Human immunodeficiency virus [HIV] disease: Secondary | ICD-10-CM

## 2021-06-19 MED ORDER — BIKTARVY 50-200-25 MG PO TABS
1.0000 | ORAL_TABLET | Freq: Every day | ORAL | 0 refills | Status: DC
  Filled 2021-06-20: qty 30, 30d supply, fill #0

## 2021-06-20 ENCOUNTER — Other Ambulatory Visit (HOSPITAL_COMMUNITY): Payer: Self-pay

## 2021-06-22 ENCOUNTER — Other Ambulatory Visit (HOSPITAL_COMMUNITY): Payer: Self-pay

## 2021-06-22 ENCOUNTER — Other Ambulatory Visit: Payer: Self-pay

## 2021-06-22 DIAGNOSIS — B20 Human immunodeficiency virus [HIV] disease: Secondary | ICD-10-CM

## 2021-06-22 MED ORDER — BIKTARVY 50-200-25 MG PO TABS
1.0000 | ORAL_TABLET | Freq: Every day | ORAL | 0 refills | Status: DC
  Filled 2021-06-22: qty 30, 30d supply, fill #0

## 2021-06-30 ENCOUNTER — Telehealth: Payer: Self-pay

## 2021-06-30 NOTE — Telephone Encounter (Signed)
Jimmy Fisher requesting callback to discuss. Thanks!

## 2021-06-30 NOTE — Telephone Encounter (Signed)
[facility] Patient Advocate Hinda Kehr J-Code [id number] is approved under the patient medical billing.   Patient will have a $50.00 office visit.  Patient will have to call insurance to find out what is his admin fee.   Patient is enrolled in [employer] Portal Claims      Clearance Coots, CPhT Specialty Pharmacy Patient [facility] for Infectious Disease Phone: [phone] Fax:  [phone]

## 2021-07-01 ENCOUNTER — Encounter: Payer: Self-pay | Admitting: Pharmacist

## 2021-07-01 NOTE — Telephone Encounter (Signed)
Sent MyChart message to patient

## 2021-07-02 ENCOUNTER — Other Ambulatory Visit (HOSPITAL_COMMUNITY): Payer: Self-pay

## 2021-07-03 ENCOUNTER — Other Ambulatory Visit (HOSPITAL_COMMUNITY): Payer: Self-pay

## 2021-07-06 ENCOUNTER — Other Ambulatory Visit (HOSPITAL_COMMUNITY): Payer: Self-pay

## 2021-07-07 ENCOUNTER — Other Ambulatory Visit (HOSPITAL_COMMUNITY): Payer: Self-pay

## 2021-07-09 ENCOUNTER — Ambulatory Visit (INDEPENDENT_AMBULATORY_CARE_PROVIDER_SITE_OTHER): Payer: 59

## 2021-07-09 ENCOUNTER — Encounter: Payer: Self-pay | Admitting: Infectious Diseases

## 2021-07-09 ENCOUNTER — Other Ambulatory Visit (HOSPITAL_COMMUNITY): Payer: Self-pay

## 2021-07-09 ENCOUNTER — Ambulatory Visit (INDEPENDENT_AMBULATORY_CARE_PROVIDER_SITE_OTHER): Payer: 59 | Admitting: Infectious Diseases

## 2021-07-09 ENCOUNTER — Other Ambulatory Visit: Payer: Self-pay

## 2021-07-09 VITALS — BP 108/66 | HR 72 | Temp 97.9°F | Wt 205.0 lb

## 2021-07-09 DIAGNOSIS — F1124 Opioid dependence with opioid-induced mood disorder: Secondary | ICD-10-CM | POA: Diagnosis not present

## 2021-07-09 DIAGNOSIS — Z23 Encounter for immunization: Secondary | ICD-10-CM | POA: Diagnosis not present

## 2021-07-09 DIAGNOSIS — M797 Fibromyalgia: Secondary | ICD-10-CM | POA: Diagnosis not present

## 2021-07-09 DIAGNOSIS — K219 Gastro-esophageal reflux disease without esophagitis: Secondary | ICD-10-CM | POA: Diagnosis not present

## 2021-07-09 DIAGNOSIS — B20 Human immunodeficiency virus [HIV] disease: Secondary | ICD-10-CM | POA: Diagnosis not present

## 2021-07-09 DIAGNOSIS — N529 Male erectile dysfunction, unspecified: Secondary | ICD-10-CM

## 2021-07-09 MED ORDER — SILDENAFIL CITRATE 100 MG PO TABS
100.0000 mg | ORAL_TABLET | Freq: Every day | ORAL | 1 refills | Status: DC | PRN
  Filled 2021-07-09: qty 10, 10d supply, fill #0
  Filled 2021-08-10: qty 10, 10d supply, fill #1

## 2021-07-09 MED ORDER — BIKTARVY 50-200-25 MG PO TABS
1.0000 | ORAL_TABLET | Freq: Every day | ORAL | 0 refills | Status: DC
  Filled 2021-07-09 – 2021-07-18 (×2): qty 30, 30d supply, fill #0

## 2021-07-09 NOTE — Progress Notes (Signed)
° °  Covid-19 Vaccination Clinic  Name:  Jimmy Fisher    MRN: [id number] DOB: July 21, 1980  07/09/2021  Jimmy Fisher was observed post Covid-19 immunization for 15 minutes without incident. He was provided with Vaccine Information Sheet and instruction to access the V-Safe system.   Jimmy Fisher was instructed to call 911 with any severe reactions post vaccine: Difficulty breathing  Swelling of face and throat  A fast heartbeat  A bad rash all over body  Dizziness and weakness   Immunizations Administered     Name Date Dose VIS Date Route   Pfizer Covid-19 Vaccine Bivalent Booster 07/09/2021  5:06 PM 0.3 mL 02/11/2021 Intramuscular   Manufacturer: [employer], [employer]   Lot: [id number]   NDC: [phone]      Jimmy Fisher

## 2021-07-09 NOTE — Assessment & Plan Note (Signed)
He is doing well, staying clean.

## 2021-07-09 NOTE — Assessment & Plan Note (Signed)
viagra dose increased at his request.

## 2021-07-09 NOTE — Assessment & Plan Note (Signed)
Much better with THC gummies.  Await legalization.
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
## 2021-07-09 NOTE — Assessment & Plan Note (Signed)
Feels like this is worsening.  Will send him to GI for urease breath testing (or endo).

## 2021-07-09 NOTE — Progress Notes (Signed)
Spoke with [name] about Jimmy Fisher. He was interested to see if it would further decrease his HIV RNA from in the 30s to zero. I explained that this is still considered undetectable/very well-controlled and the Cabenuva would not out-perform his Biktarvy in that way. To avoid the $50 copay with each visit and more frequent appointments, he opts to stick with Biktarvy.   Georgeann Oppenheim, PharmD Pharmacy Resident 07/09/2021, 4:26 PM

## 2021-07-09 NOTE — Assessment & Plan Note (Signed)
He is doing well.  Labs done today.  Will continue biktarvy, refilled Offered/refused condoms.  Flu and covid vax today.  rtc in 4-5 months.

## 2021-07-10 ENCOUNTER — Other Ambulatory Visit (HOSPITAL_COMMUNITY): Payer: Self-pay

## 2021-07-10 LAB — T-HELPER CELL (CD4) - (RCID CLINIC ONLY)
CD4 % Helper T Cell: 38 % (ref 33–65)
CD4 T Cell Abs: 1059 /uL (ref 400–1790)

## 2021-07-10 NOTE — Telephone Encounter (Signed)
Patient no longer interested in [place].

## 2021-07-10 NOTE — Telephone Encounter (Signed)
Wow ok

## 2021-07-11 LAB — CBC WITH DIFFERENTIAL/PLATELET
Absolute Monocytes: 609 cells/uL (ref 200–950)
Basophils Absolute: 70 cells/uL (ref 0–200)
Basophils Relative: 1 %
Eosinophils Absolute: 252 cells/uL (ref 15–500)
Eosinophils Relative: 3.6 %
HCT: 44.5 % (ref 38.5–50.0)
Hemoglobin: 15.1 g/dL (ref 13.2–17.1)
Lymphs Abs: 2891 cells/uL (ref 850–3900)
MCH: 30.1 pg (ref 27.0–33.0)
MCHC: 33.9 g/dL (ref 32.0–36.0)
MCV: 88.6 fL (ref 80.0–100.0)
MPV: 9.1 fL (ref 7.5–12.5)
Monocytes Relative: 8.7 %
Neutro Abs: 3178 cells/uL (ref 1500–7800)
Neutrophils Relative %: 45.4 %
Platelets: 274 10*3/uL (ref 140–400)
RBC: 5.02 10*6/uL (ref 4.20–5.80)
RDW: 13.9 % (ref 11.0–15.0)
Total Lymphocyte: 41.3 %
WBC: 7 10*3/uL (ref 3.8–10.8)

## 2021-07-11 LAB — COMPLETE METABOLIC PANEL WITH GFR
AG Ratio: 1.8 (calc) (ref 1.0–2.5)
ALT: 32 U/L (ref 9–46)
AST: 25 U/L (ref 10–40)
Albumin: 4.4 g/dL (ref 3.6–5.1)
Alkaline phosphatase (APISO): 66 U/L (ref 36–130)
BUN/Creatinine Ratio: 8 (calc) (ref 6–22)
BUN: 11 mg/dL (ref 7–25)
CO2: 32 mmol/L (ref 20–32)
Calcium: 9.6 mg/dL (ref 8.6–10.3)
Chloride: 103 mmol/L (ref 98–110)
Creat: 1.3 mg/dL — ABNORMAL HIGH (ref 0.60–1.29)
Globulin: 2.4 g/dL (calc) (ref 1.9–3.7)
Glucose, Bld: 90 mg/dL (ref 65–99)
Potassium: 4.2 mmol/L (ref 3.5–5.3)
Sodium: 141 mmol/L (ref 135–146)
Total Bilirubin: 0.5 mg/dL (ref 0.2–1.2)
Total Protein: 6.8 g/dL (ref 6.1–8.1)
eGFR: 70 mL/min/{1.73_m2} (ref >=60)

## 2021-07-11 LAB — HIV-1 RNA QUANT-NO REFLEX-BLD
HIV 1 RNA Quant: NOT DETECTED Copies/mL
HIV-1 RNA Quant, Log: NOT DETECTED Log cps/mL

## 2021-07-11 LAB — RPR: RPR Ser Ql: NONREACTIVE

## 2021-07-13 ENCOUNTER — Encounter: Payer: Self-pay | Admitting: Infectious Diseases

## 2021-07-13 ENCOUNTER — Encounter: Payer: Self-pay | Admitting: Gastroenterology

## 2021-07-15 ENCOUNTER — Other Ambulatory Visit (HOSPITAL_COMMUNITY): Payer: Self-pay

## 2021-07-18 ENCOUNTER — Other Ambulatory Visit (HOSPITAL_COMMUNITY): Payer: Self-pay

## 2021-07-30 ENCOUNTER — Other Ambulatory Visit: Payer: Self-pay | Admitting: Infectious Diseases

## 2021-07-30 ENCOUNTER — Other Ambulatory Visit (HOSPITAL_COMMUNITY): Payer: Self-pay

## 2021-07-30 DIAGNOSIS — B20 Human immunodeficiency virus [HIV] disease: Secondary | ICD-10-CM

## 2021-07-30 MED ORDER — BIKTARVY 50-200-25 MG PO TABS
1.0000 | ORAL_TABLET | Freq: Every day | ORAL | 4 refills | Status: DC
  Filled 2021-07-30 – 2021-08-14 (×2): qty 30, 30d supply, fill #0
  Filled 2021-09-04: qty 30, 30d supply, fill #1
  Filled 2021-09-29: qty 30, 30d supply, fill #2
  Filled 2021-10-26: qty 30, 30d supply, fill #3
  Filled 2021-11-19: qty 30, 30d supply, fill #4

## 2021-08-04 ENCOUNTER — Other Ambulatory Visit: Payer: 59

## 2021-08-04 ENCOUNTER — Encounter: Payer: Self-pay | Admitting: Gastroenterology

## 2021-08-04 ENCOUNTER — Other Ambulatory Visit (HOSPITAL_COMMUNITY): Payer: Self-pay

## 2021-08-04 ENCOUNTER — Ambulatory Visit (INDEPENDENT_AMBULATORY_CARE_PROVIDER_SITE_OTHER): Payer: 59 | Admitting: Gastroenterology

## 2021-08-04 VITALS — BP 110/68 | HR 94 | Ht 66.0 in | Wt 210.0 lb

## 2021-08-04 DIAGNOSIS — Z8619 Personal history of other infectious and parasitic diseases: Secondary | ICD-10-CM

## 2021-08-04 DIAGNOSIS — R1013 Epigastric pain: Secondary | ICD-10-CM | POA: Diagnosis not present

## 2021-08-04 DIAGNOSIS — Z8711 Personal history of peptic ulcer disease: Secondary | ICD-10-CM

## 2021-08-04 MED ORDER — SUCRALFATE 1 G PO TABS
1.0000 g | ORAL_TABLET | Freq: Four times a day (QID) | ORAL | 0 refills | Status: DC
  Filled 2021-08-04: qty 60, 15d supply, fill #0

## 2021-08-04 NOTE — Progress Notes (Signed)
HPI : Jimmy Fisher is a very pleasant 41 year old male with reported history of peptic ulcer disease and H. pylori and HIV who is referred to [place] by Dr. Lita Mains for further evaluation of epigastric pain.  The patient states that he started having epigastric pain a little over a month ago.  This pain is identical to the pain he had previously when he was diagnosed with H. pylori and peptic ulcer disease.  He was treated over 5 years ago for an ulcer and H. pylori.  He reports having to take multiple rounds of antibiotics to eradicate H. pylori.  He describes his upper abdominal pain as a gnawing, burning sensation in the epigastrium which has been off and on for the past month.  His appetite has been okay and he denies any nausea or vomiting.  His weight has been stable.  He has chronic erratic stool patterns (constipation alternating with diarrhea), but reports normal appearing stools, without hematochezia or melena.  No dysphagia.  He has a history of heartburn and acid reflux, but these are well controlled with omeprazole.  He has been taking omeprazole daily for at least a year, and actually increased it to twice a day when he started having his epigastric pain.  He does admit to recently restarting BC powder for headaches.  He states that other medications do not help his headaches, but BCs does.   Past Medical History:  Diagnosis Date   Anxiety    Depression    DVT (deep venous thrombosis) (HCC)    occurred during hospitalization for overdose   Fibromyalgia    GERD (gastroesophageal reflux disease)    Hiatal hernia    HIV (human immunodeficiency virus infection) ([place])    Hypercholesteremia    Hypertension    Neuromuscular disorder ([place])    fibromyalgia   Pulmonary embolism ([place])    2012   SLAP tear of shoulder    left     Past Surgical History:  Procedure Laterality Date   COLONOSCOPY     I & D EXTREMITY Left 07/16/2020   Procedure: IRRIGATION AND DEBRIDEMENT FOREARM;   Surgeon: Iran Planas, MD;  Location: [place];  Service: Orthopedics;  Laterality: Left;   SHOULDER ARTHROSCOPY WITH BICEPS TENDON REPAIR Left 10/22/2020   Procedure: LEFT SHOULDER ARTHROSCOPY, BICEPS TENODESIS;  Surgeon: Leandrew Koyanagi, MD;  Location: [place];  Service: Orthopedics;  Laterality: Left;   SUBACROMIAL DECOMPRESSION Left 10/22/2020   Procedure: SUBACROMIAL DECOMPRESSION;  Surgeon: Leandrew Koyanagi, MD;  Location: [place];  Service: Orthopedics;  Laterality: Left;   UPPER GI ENDOSCOPY     Family History  Problem Relation Age of Onset   Fibromyalgia Mother    Cancer Father    Social History   Tobacco Use   Smoking status: Every Day    Types: E-cigarettes   Smokeless tobacco: Former    Types: Snuff  Vaping Use   Vaping Use: Every day   Substances: Nicotine  Substance Use Topics   Alcohol use: Not Currently    Comment: quit 06/2020   Current Outpatient Medications  Medication Sig Dispense Refill   bictegravir-emtricitabine-tenofovir AF (BIKTARVY) 50-200-25 MG TABS tablet Take 1 tablet by mouth daily. 30 tablet 4   cetirizine (ZYRTEC) 10 MG tablet Take 10 mg by mouth daily.     FLUoxetine (PROZAC) 40 MG capsule Take 1 capsule (40 mg total) by mouth daily. 60 capsule 3   olmesartan (BENICAR) 20 MG tablet Take 1  tablet (20 mg total) by mouth daily. 90 tablet 3   omeprazole (PRILOSEC) 20 MG capsule TAKE 1 CAPSULE BY MOUTH TWICE DAILY 180 capsule 0   sildenafil (VIAGRA) 100 MG tablet Take 1 tablet (100 mg total) by mouth daily as needed for erectile dysfunction. 10 tablet 1   No current facility-administered medications for this visit.   No Known Allergies   Review of Systems: All systems reviewed and negative except where noted in HPI.    No results found.  Physical Exam: BP 110/68    Pulse 94    Ht 5\' 6"  (1.676 m)    Wt 210 lb (95.3 kg)    BMI 33.89 kg/m  Constitutional: Pleasant,well-developed, Caucasian male in no acute  distress. HEENT: Normocephalic and atraumatic. Conjunctivae are normal. No scleral icterus. Neck supple.  Cardiovascular: Normal rate, regular rhythm.  Pulmonary/chest: Effort normal and breath sounds normal. No wheezing, rales or rhonchi. Abdominal: Soft, nondistended, nontender. Bowel sounds active throughout. There are no masses palpable. No hepatomegaly. Extremities: no edema Neurological: Alert and oriented to person place and time. Skin: Skin is warm and dry. No rashes noted. Psychiatric: Normal mood and affect. Behavior is normal.  CBC    Component Value Date/Time   WBC 7.0 07/09/2021 1605   RBC 5.02 07/09/2021 1605   HGB 15.1 07/09/2021 1605   HGB 14.4 10/10/2019 1626   HCT 44.5 07/09/2021 1605   HCT 41.0 10/10/2019 1626   PLT 274 07/09/2021 1605   PLT 224 10/10/2019 1626   MCV 88.6 07/09/2021 1605   MCV 84 10/10/2019 1626   MCH 30.1 07/09/2021 1605   MCHC 33.9 07/09/2021 1605   RDW 13.9 07/09/2021 1605   RDW 14.6 10/10/2019 1626   LYMPHSABS 2,891 07/09/2021 1605   LYMPHSABS 2.7 10/10/2019 1626   MONOABS 0.9 09/13/2020 1809   EOSABS 252 07/09/2021 1605   EOSABS 0.1 10/10/2019 1626   BASOSABS 70 07/09/2021 1605   BASOSABS 0.0 10/10/2019 1626    CMP     Component Value Date/Time   NA 141 07/09/2021 1605   K 4.2 07/09/2021 1605   CL 103 07/09/2021 1605   CO2 32 07/09/2021 1605   GLUCOSE 90 07/09/2021 1605   BUN 11 07/09/2021 1605   CREATININE 1.30 (H) 07/09/2021 1605   CALCIUM 9.6 07/09/2021 1605   PROT 6.8 07/09/2021 1605   ALBUMIN 4.3 09/13/2020 1809   AST 25 07/09/2021 1605   ALT 32 07/09/2021 1605   ALKPHOS 84 09/13/2020 1809   BILITOT 0.5 07/09/2021 1605   GFRNONAA >60 09/13/2020 1809   GFRAA >60 10/13/2019 0132     ASSESSMENT AND PLAN: 41 year old male with reported history of peptic ulcer disease and H. pylori treated several years ago, with recurrence of epigastric pain which she says is the same as his previous pain.  He has been taking  omeprazole, and increased to twice a day without improvement in his pain.  He has been taking BC powder for headaches, but states that he started taking this after his epigastric pain came back.  I recommended an upper endoscopy to assess for peptic ulcer disease and H. pylori.  The patient asked if it were absolutely necessary to do an endoscopy.  I stated that we could empirically treat for an ulcer, and test for H. pylori with a stool antigen test.  I informed him that the stool antigen test sensitivity decreases with PPI use, recommended he hold this for 2 weeks to optimize the sensitivity.  Patient states  that he would try to hold the omeprazole for as long as he can tolerate it and then submit stool sample.  If the stool antigen test is negative, and his symptoms are not improving, I recommend we proceed with an upper endoscopy.  The patient was agreeable to this.  If, however his H. pylori test is negative and his symptoms improve with continued twice daily PPI and the addition of Carafate, I do not think an upper endoscopy is necessary.   Epigastric pain - H. Pylori stool antigen test if negative and symptoms not improving do EGD -Omeprazole bid -Carafate 1 g 4 times daily - Stop BC powder and all NSAIDs  Venus Gilles E. Candis Schatz, MD [facility]   CC:  Campbell Riches, MD

## 2021-08-04 NOTE — Patient Instructions (Signed)
If you are age 43 or older, your body mass index should be between 23-30. Your Body mass index is 33.89 kg/m. If this is out of the aforementioned range listed, please consider follow up with your Primary Care Provider.  If you are age 26 or younger, your body mass index should be between 19-25. Your Body mass index is 33.89 kg/m. If this is out of the aformentioned range listed, please consider follow up with your Primary Care Provider.   We have sent the following medications to your pharmacy for you to pick up at your convenience: Carafate 1 gram.  Take Omeprazole 20 mg twice daily.  Your provider has requested that you go to the basement level for lab work before leaving today. Press "B" on the elevator. The lab is located at the first door on the left as you exit the elevator.  The [place] [facility] providers would like to encourage you to use [facility] to communicate with providers for non-urgent requests or questions.  Due to long hold times on the telephone, sending your provider a message by [facility] may be a faster and more efficient way to get a response.  Please allow 48 business hours for a response.  Please remember that this is for non-urgent requests.   It was a pleasure to see you today!  Thank you for trusting me with your gastrointestinal care!    Scott E.Tomasa Rand, MD

## 2021-08-05 ENCOUNTER — Other Ambulatory Visit (HOSPITAL_COMMUNITY): Payer: Self-pay

## 2021-08-07 ENCOUNTER — Other Ambulatory Visit (HOSPITAL_COMMUNITY): Payer: Self-pay

## 2021-08-10 ENCOUNTER — Other Ambulatory Visit (HOSPITAL_COMMUNITY): Payer: Self-pay

## 2021-08-12 ENCOUNTER — Other Ambulatory Visit: Payer: Self-pay | Admitting: Gastroenterology

## 2021-08-12 ENCOUNTER — Other Ambulatory Visit (HOSPITAL_COMMUNITY): Payer: Self-pay

## 2021-08-12 MED ORDER — SUCRALFATE 1 G PO TABS
1.0000 g | ORAL_TABLET | Freq: Four times a day (QID) | ORAL | 0 refills | Status: DC
  Filled 2021-08-12: qty 60, 15d supply, fill #0

## 2021-08-14 ENCOUNTER — Other Ambulatory Visit (HOSPITAL_COMMUNITY): Payer: Self-pay

## 2021-08-17 ENCOUNTER — Other Ambulatory Visit (HOSPITAL_COMMUNITY): Payer: Self-pay

## 2021-08-27 ENCOUNTER — Other Ambulatory Visit: Payer: Self-pay | Admitting: Gastroenterology

## 2021-08-27 ENCOUNTER — Other Ambulatory Visit (HOSPITAL_COMMUNITY): Payer: Self-pay

## 2021-08-27 MED ORDER — SUCRALFATE 1 G PO TABS
1.0000 g | ORAL_TABLET | Freq: Four times a day (QID) | ORAL | 0 refills | Status: DC
  Filled 2021-08-27 – 2021-08-28 (×2): qty 60, 15d supply, fill #0

## 2021-08-28 ENCOUNTER — Other Ambulatory Visit (HOSPITAL_COMMUNITY): Payer: Self-pay

## 2021-08-28 ENCOUNTER — Other Ambulatory Visit: Payer: 59

## 2021-08-28 DIAGNOSIS — Z8711 Personal history of peptic ulcer disease: Secondary | ICD-10-CM

## 2021-08-28 DIAGNOSIS — R1013 Epigastric pain: Secondary | ICD-10-CM

## 2021-08-29 ENCOUNTER — Other Ambulatory Visit (HOSPITAL_COMMUNITY): Payer: Self-pay

## 2021-08-29 ENCOUNTER — Other Ambulatory Visit: Payer: Self-pay | Admitting: Infectious Diseases

## 2021-08-29 DIAGNOSIS — K219 Gastro-esophageal reflux disease without esophagitis: Secondary | ICD-10-CM

## 2021-08-31 ENCOUNTER — Other Ambulatory Visit (HOSPITAL_COMMUNITY): Payer: Self-pay

## 2021-08-31 MED ORDER — OMEPRAZOLE 20 MG PO CPDR
DELAYED_RELEASE_CAPSULE | ORAL | 0 refills | Status: DC
  Filled 2021-08-31: qty 60, 30d supply, fill #0
  Filled 2021-09-28: qty 60, 30d supply, fill #1
  Filled 2021-10-23: qty 60, 30d supply, fill #2

## 2021-09-02 LAB — H. PYLORI ANTIGEN, STOOL: H pylori Ag, Stl: NEGATIVE

## 2021-09-03 ENCOUNTER — Encounter: Payer: Self-pay | Admitting: Gastroenterology

## 2021-09-03 NOTE — Progress Notes (Signed)
Jimmy Fisher,  ?Your H. Pylori stool test was negative.  If your abdominal pain is not improving, I recommend you undergo an EGD (upper endoscopy) as previously discussed.  How is your pain now?

## 2021-09-04 ENCOUNTER — Other Ambulatory Visit (HOSPITAL_COMMUNITY): Payer: Self-pay

## 2021-09-08 ENCOUNTER — Other Ambulatory Visit: Payer: Self-pay | Admitting: Infectious Diseases

## 2021-09-08 ENCOUNTER — Other Ambulatory Visit (HOSPITAL_COMMUNITY): Payer: Self-pay

## 2021-09-08 DIAGNOSIS — N529 Male erectile dysfunction, unspecified: Secondary | ICD-10-CM

## 2021-09-09 NOTE — Telephone Encounter (Signed)
Please advise if okay to refill. 

## 2021-09-10 ENCOUNTER — Other Ambulatory Visit (HOSPITAL_COMMUNITY): Payer: Self-pay

## 2021-09-10 MED ORDER — SILDENAFIL CITRATE 100 MG PO TABS
100.0000 mg | ORAL_TABLET | Freq: Every day | ORAL | 1 refills | Status: DC | PRN
  Filled 2021-09-10: qty 10, 10d supply, fill #0

## 2021-09-28 ENCOUNTER — Other Ambulatory Visit (HOSPITAL_COMMUNITY): Payer: Self-pay

## 2021-09-28 MED ORDER — METHOCARBAMOL 500 MG PO TABS
500.0000 mg | ORAL_TABLET | Freq: Two times a day (BID) | ORAL | 0 refills | Status: DC
  Filled 2021-09-28: qty 14, 7d supply, fill #0

## 2021-09-28 MED ORDER — LIDOCAINE 5 % EX PTCH
1.0000 | MEDICATED_PATCH | Freq: Every day | CUTANEOUS | 0 refills | Status: DC
  Filled 2021-09-28 – 2021-10-09 (×3): qty 30, 30d supply, fill #0

## 2021-09-28 MED ORDER — LIDOCAINE 5 % EX PTCH
MEDICATED_PATCH | CUTANEOUS | 0 refills | Status: DC
  Filled 2021-09-29 – 2021-09-30 (×2): qty 30, 30d supply, fill #0

## 2021-09-28 MED ORDER — MELOXICAM 7.5 MG PO TABS
ORAL_TABLET | ORAL | 0 refills | Status: DC
  Filled 2021-09-28: qty 14, 7d supply, fill #0
  Filled 2021-09-29: qty 14, fill #0
  Filled 2021-09-30: qty 14, 7d supply, fill #0

## 2021-09-28 MED ORDER — METHOCARBAMOL 500 MG PO TABS
ORAL_TABLET | ORAL | 0 refills | Status: DC
  Filled 2021-09-29: qty 14, fill #0
  Filled 2021-09-30: qty 14, 7d supply, fill #0

## 2021-09-28 MED ORDER — MELOXICAM 7.5 MG PO TABS
7.5000 mg | ORAL_TABLET | Freq: Two times a day (BID) | ORAL | 0 refills | Status: DC
  Filled 2021-09-28: qty 14, 7d supply, fill #0

## 2021-09-29 ENCOUNTER — Other Ambulatory Visit (HOSPITAL_COMMUNITY): Payer: Self-pay

## 2021-09-30 ENCOUNTER — Other Ambulatory Visit (HOSPITAL_COMMUNITY): Payer: Self-pay

## 2021-10-01 ENCOUNTER — Other Ambulatory Visit (HOSPITAL_COMMUNITY): Payer: Self-pay

## 2021-10-03 ENCOUNTER — Other Ambulatory Visit (HOSPITAL_COMMUNITY): Payer: Self-pay

## 2021-10-05 ENCOUNTER — Other Ambulatory Visit (HOSPITAL_COMMUNITY): Payer: Self-pay

## 2021-10-06 ENCOUNTER — Other Ambulatory Visit (HOSPITAL_COMMUNITY): Payer: Self-pay

## 2021-10-09 ENCOUNTER — Other Ambulatory Visit (HOSPITAL_COMMUNITY): Payer: Self-pay

## 2021-10-14 ENCOUNTER — Other Ambulatory Visit (HOSPITAL_COMMUNITY): Payer: Self-pay

## 2021-10-16 ENCOUNTER — Other Ambulatory Visit (HOSPITAL_COMMUNITY): Payer: Self-pay

## 2021-10-16 MED ORDER — PREDNISONE 5 MG (21) PO TBPK
ORAL_TABLET | ORAL | 1 refills | Status: DC
  Filled 2021-10-16 – 2021-10-17 (×2): qty 21, 6d supply, fill #0

## 2021-10-17 ENCOUNTER — Other Ambulatory Visit (HOSPITAL_COMMUNITY): Payer: Self-pay

## 2021-10-19 ENCOUNTER — Other Ambulatory Visit (HOSPITAL_COMMUNITY): Payer: Self-pay

## 2021-10-20 ENCOUNTER — Other Ambulatory Visit (HOSPITAL_COMMUNITY): Payer: Self-pay

## 2021-10-23 ENCOUNTER — Other Ambulatory Visit (HOSPITAL_COMMUNITY): Payer: Self-pay

## 2021-10-26 ENCOUNTER — Other Ambulatory Visit (HOSPITAL_COMMUNITY): Payer: Self-pay

## 2021-10-28 ENCOUNTER — Other Ambulatory Visit (HOSPITAL_COMMUNITY): Payer: Self-pay

## 2021-10-29 ENCOUNTER — Other Ambulatory Visit (HOSPITAL_COMMUNITY): Payer: Self-pay

## 2021-11-05 ENCOUNTER — Other Ambulatory Visit: Payer: Self-pay

## 2021-11-05 ENCOUNTER — Encounter: Payer: Self-pay | Admitting: Infectious Diseases

## 2021-11-05 ENCOUNTER — Ambulatory Visit (INDEPENDENT_AMBULATORY_CARE_PROVIDER_SITE_OTHER): Payer: 59 | Admitting: Infectious Diseases

## 2021-11-05 ENCOUNTER — Other Ambulatory Visit (HOSPITAL_COMMUNITY): Payer: Self-pay

## 2021-11-05 VITALS — BP 126/84 | HR 55 | Resp 16 | Ht 66.0 in | Wt 202.1 lb

## 2021-11-05 DIAGNOSIS — B20 Human immunodeficiency virus [HIV] disease: Secondary | ICD-10-CM

## 2021-11-05 DIAGNOSIS — N529 Male erectile dysfunction, unspecified: Secondary | ICD-10-CM

## 2021-11-05 DIAGNOSIS — Z79899 Other long term (current) drug therapy: Secondary | ICD-10-CM

## 2021-11-05 DIAGNOSIS — R590 Localized enlarged lymph nodes: Secondary | ICD-10-CM

## 2021-11-05 DIAGNOSIS — F1124 Opioid dependence with opioid-induced mood disorder: Secondary | ICD-10-CM

## 2021-11-05 DIAGNOSIS — Z113 Encounter for screening for infections with a predominantly sexual mode of transmission: Secondary | ICD-10-CM

## 2021-11-05 DIAGNOSIS — E782 Mixed hyperlipidemia: Secondary | ICD-10-CM

## 2021-11-05 MED ORDER — SILDENAFIL CITRATE 25 MG PO TABS
ORAL_TABLET | ORAL | 3 refills | Status: DC
  Filled 2021-11-05: qty 10, fill #0
  Filled 2021-11-06: qty 10, 30d supply, fill #0

## 2021-11-05 NOTE — Assessment & Plan Note (Signed)
Has bene staying clean. Encouraged pt Will f/u

## 2021-11-05 NOTE — Assessment & Plan Note (Signed)
Lab Results  Component Value Date   CHOL 230 (H) 05/06/2021   HDL 39 (L) 05/06/2021   LDLCALC 147 (H) 05/06/2021   TRIG 268 (H) 05/06/2021   CHOLHDL 5.9 (H) 05/06/2021    Will f/u in IMTS and consider staring statin. His ascvd score would qualify for atorvastatin.   The 10-year ASCVD risk score (Arnett DK, et al., 2019) is: 9.4%   Values used to calculate the score:     Age: 41 years     Sex: Male     Is Non-Hispanic African American: No     Diabetic: No     Tobacco smoker: Yes     Systolic Blood Pressure: 126 mmHg     Is BP treated: Yes     HDL Cholesterol: 39 mg/dL     Total Cholesterol: 230 mg/dL

## 2021-11-05 NOTE — Assessment & Plan Note (Signed)
viagra is refilled U=U

## 2021-11-05 NOTE — Assessment & Plan Note (Addendum)
He is doing well Will continue on biktarvy Offered/refused condoms.  (I pull out) He is working on wt loss.  Labs reviewed, sl increase in Cr which we will f/u at his next visit.  rtc in 9 months

## 2021-11-05 NOTE — Assessment & Plan Note (Signed)
He c/o anterior cervical lan. I do not feel this on exam. I asked him to let me know if this recurs and will set him up for CT neck and ENT eval.  He does not smoke, vapes.
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
## 2021-11-06 ENCOUNTER — Other Ambulatory Visit (HOSPITAL_COMMUNITY): Payer: Self-pay

## 2021-11-10 ENCOUNTER — Other Ambulatory Visit (HOSPITAL_BASED_OUTPATIENT_CLINIC_OR_DEPARTMENT_OTHER): Payer: Self-pay

## 2021-11-10 ENCOUNTER — Emergency Department (HOSPITAL_BASED_OUTPATIENT_CLINIC_OR_DEPARTMENT_OTHER): Payer: 59

## 2021-11-10 ENCOUNTER — Emergency Department (HOSPITAL_BASED_OUTPATIENT_CLINIC_OR_DEPARTMENT_OTHER)
Admission: EM | Admit: 2021-11-10 | Discharge: 2021-11-10 | Disposition: A | Discharge status: Home / Self Care | Payer: 59 | Attending: Emergency Medicine | Admitting: Emergency Medicine

## 2021-11-10 ENCOUNTER — Other Ambulatory Visit (HOSPITAL_COMMUNITY): Payer: Self-pay

## 2021-11-10 ENCOUNTER — Other Ambulatory Visit: Payer: Self-pay

## 2021-11-10 ENCOUNTER — Encounter: Payer: Self-pay | Admitting: Infectious Diseases

## 2021-11-10 ENCOUNTER — Encounter (HOSPITAL_BASED_OUTPATIENT_CLINIC_OR_DEPARTMENT_OTHER): Payer: Self-pay | Admitting: *Deleted

## 2021-11-10 DIAGNOSIS — S61432A Puncture wound without foreign body of left hand, initial encounter: Secondary | ICD-10-CM

## 2021-11-10 DIAGNOSIS — S6992XA Unspecified injury of left wrist, hand and finger(s), initial encounter: Secondary | ICD-10-CM | POA: Diagnosis present

## 2021-11-10 DIAGNOSIS — Z23 Encounter for immunization: Secondary | ICD-10-CM | POA: Diagnosis not present

## 2021-11-10 MED ORDER — CEPHALEXIN 500 MG PO CAPS
500.0000 mg | ORAL_CAPSULE | Freq: Three times a day (TID) | ORAL | 0 refills | Status: AC
  Filled 2021-11-10: qty 21, 7d supply, fill #0

## 2021-11-10 MED ORDER — TETANUS-DIPHTH-ACELL PERTUSSIS 5-2.5-18.5 LF-MCG/0.5 IM SUSY
0.5000 mL | PREFILLED_SYRINGE | Freq: Once | INTRAMUSCULAR | Status: AC
  Administered 2021-11-10: 0.5 mL via INTRAMUSCULAR
  Filled 2021-11-10: qty 0.5
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
## 2021-11-10 NOTE — Discharge Instructions (Signed)
Follow up with hand specialist, call to schedule an appointment. Take Keflex as prescribed to try and prevent infection.

## 2021-11-10 NOTE — ED Notes (Signed)
Left hand placed in diluted betadine solution as per ED PA request

## 2021-11-10 NOTE — ED Notes (Signed)
Ring removed from left hand and given to wife.  Left 2nd digit is numb/ tingling sensation. Able to extend and flex finger and move left index finger lateral and medial Color wnl Capillary refill WNL Left radial pulse WNL
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
## 2021-11-11 ENCOUNTER — Other Ambulatory Visit: Payer: Self-pay

## 2021-11-11 ENCOUNTER — Other Ambulatory Visit (HOSPITAL_COMMUNITY): Payer: Self-pay

## 2021-11-11 DIAGNOSIS — N529 Male erectile dysfunction, unspecified: Secondary | ICD-10-CM

## 2021-11-11 MED ORDER — SILDENAFIL CITRATE 100 MG PO TABS
ORAL_TABLET | ORAL | 1 refills | Status: DC
  Filled 2021-11-11: qty 10, 10d supply, fill #0
  Filled 2021-12-23: qty 10, 10d supply, fill #1

## 2021-11-13 ENCOUNTER — Other Ambulatory Visit (HOSPITAL_COMMUNITY): Payer: Self-pay

## 2021-11-19 ENCOUNTER — Other Ambulatory Visit (HOSPITAL_COMMUNITY): Payer: Self-pay

## 2021-11-23 ENCOUNTER — Other Ambulatory Visit: Payer: Self-pay | Admitting: Student

## 2021-11-23 ENCOUNTER — Other Ambulatory Visit (HOSPITAL_COMMUNITY): Payer: Self-pay

## 2021-11-23 DIAGNOSIS — K219 Gastro-esophageal reflux disease without esophagitis: Secondary | ICD-10-CM

## 2021-11-24 ENCOUNTER — Other Ambulatory Visit (HOSPITAL_COMMUNITY): Payer: Self-pay

## 2021-11-24 MED ORDER — OMEPRAZOLE 20 MG PO CPDR
DELAYED_RELEASE_CAPSULE | ORAL | 0 refills | Status: DC
  Filled 2021-11-24: qty 60, 30d supply, fill #0
  Filled 2021-12-21: qty 60, 30d supply, fill #1
  Filled 2022-01-16: qty 60, 30d supply, fill #2

## 2021-11-25 ENCOUNTER — Other Ambulatory Visit (HOSPITAL_COMMUNITY): Payer: Self-pay

## 2021-12-21 ENCOUNTER — Other Ambulatory Visit (HOSPITAL_COMMUNITY): Payer: Self-pay

## 2021-12-22 ENCOUNTER — Other Ambulatory Visit (HOSPITAL_COMMUNITY): Payer: Self-pay

## 2021-12-23 ENCOUNTER — Other Ambulatory Visit (HOSPITAL_COMMUNITY): Payer: Self-pay

## 2021-12-24 ENCOUNTER — Other Ambulatory Visit (HOSPITAL_COMMUNITY): Payer: Self-pay

## 2021-12-28 ENCOUNTER — Other Ambulatory Visit: Payer: Self-pay | Admitting: Infectious Diseases

## 2021-12-28 ENCOUNTER — Other Ambulatory Visit (HOSPITAL_COMMUNITY): Payer: Self-pay

## 2021-12-28 DIAGNOSIS — B20 Human immunodeficiency virus [HIV] disease: Secondary | ICD-10-CM

## 2021-12-28 MED ORDER — BIKTARVY 50-200-25 MG PO TABS
1.0000 | ORAL_TABLET | Freq: Every day | ORAL | 5 refills | Status: DC
  Filled 2021-12-28: qty 30, 30d supply, fill #0
  Filled 2022-01-27: qty 30, 30d supply, fill #1
  Filled 2022-02-23: qty 30, 30d supply, fill #2
  Filled 2022-03-29: qty 30, 30d supply, fill #3
  Filled 2022-04-30: qty 30, 30d supply, fill #4
  Filled 2022-05-19 – 2022-05-26 (×2): qty 30, 30d supply, fill #5

## 2022-01-05 ENCOUNTER — Other Ambulatory Visit (HOSPITAL_COMMUNITY): Payer: Self-pay

## 2022-01-15 ENCOUNTER — Other Ambulatory Visit (HOSPITAL_COMMUNITY): Payer: Self-pay

## 2022-01-15 ENCOUNTER — Other Ambulatory Visit: Payer: Self-pay | Admitting: Student

## 2022-01-15 DIAGNOSIS — F339 Major depressive disorder, recurrent, unspecified: Secondary | ICD-10-CM

## 2022-01-15 MED ORDER — FLUOXETINE HCL 40 MG PO CAPS
40.0000 mg | ORAL_CAPSULE | Freq: Every day | ORAL | 3 refills | Status: DC
  Filled 2022-01-15: qty 30, 30d supply, fill #0
  Filled 2022-02-15: qty 30, 30d supply, fill #1
  Filled 2022-03-15: qty 30, 30d supply, fill #2
  Filled 2022-04-10: qty 30, 30d supply, fill #3
  Filled 2022-05-10: qty 30, 30d supply, fill #4
  Filled 2022-06-07: qty 30, 30d supply, fill #5
  Filled 2022-07-05: qty 30, 30d supply, fill #6
  Filled 2022-08-02 – 2022-11-15 (×2): qty 30, 30d supply, fill #7
  Filled 2022-12-20 (×2): qty 30, 30d supply, fill #8
  Filled 2023-01-10 (×2): qty 30, 30d supply, fill #9

## 2022-01-16 ENCOUNTER — Other Ambulatory Visit (HOSPITAL_COMMUNITY): Payer: Self-pay

## 2022-01-18 ENCOUNTER — Other Ambulatory Visit: Payer: Self-pay | Admitting: Infectious Diseases

## 2022-01-18 DIAGNOSIS — N529 Male erectile dysfunction, unspecified: Secondary | ICD-10-CM

## 2022-01-20 ENCOUNTER — Other Ambulatory Visit (HOSPITAL_COMMUNITY): Payer: Self-pay

## 2022-01-20 MED ORDER — SILDENAFIL CITRATE 100 MG PO TABS
ORAL_TABLET | ORAL | 1 refills | Status: DC
  Filled 2022-01-20: qty 10, 20d supply, fill #0
  Filled 2022-03-04: qty 10, 20d supply, fill #1

## 2022-01-21 ENCOUNTER — Other Ambulatory Visit (HOSPITAL_COMMUNITY): Payer: Self-pay

## 2022-01-25 ENCOUNTER — Other Ambulatory Visit (HOSPITAL_COMMUNITY): Payer: Self-pay

## 2022-01-27 ENCOUNTER — Other Ambulatory Visit (HOSPITAL_COMMUNITY): Payer: Self-pay

## 2022-02-01 ENCOUNTER — Other Ambulatory Visit (HOSPITAL_COMMUNITY): Payer: Self-pay

## 2022-02-09 ENCOUNTER — Encounter: Payer: Self-pay | Admitting: *Deleted

## 2022-02-15 ENCOUNTER — Other Ambulatory Visit: Payer: Self-pay | Admitting: Student

## 2022-02-15 ENCOUNTER — Other Ambulatory Visit (HOSPITAL_COMMUNITY): Payer: Self-pay

## 2022-02-15 DIAGNOSIS — K219 Gastro-esophageal reflux disease without esophagitis: Secondary | ICD-10-CM

## 2022-02-16 ENCOUNTER — Other Ambulatory Visit (HOSPITAL_COMMUNITY): Payer: Self-pay

## 2022-02-17 ENCOUNTER — Other Ambulatory Visit (HOSPITAL_COMMUNITY): Payer: Self-pay

## 2022-02-17 MED ORDER — OMEPRAZOLE 20 MG PO CPDR
DELAYED_RELEASE_CAPSULE | ORAL | 0 refills | Status: DC
  Filled 2022-02-17: qty 60, 30d supply, fill #0
  Filled 2022-03-15: qty 60, 30d supply, fill #1
  Filled 2022-04-12: qty 60, 30d supply, fill #2

## 2022-02-22 ENCOUNTER — Other Ambulatory Visit (HOSPITAL_COMMUNITY): Payer: Self-pay

## 2022-02-23 ENCOUNTER — Other Ambulatory Visit (HOSPITAL_COMMUNITY): Payer: Self-pay

## 2022-03-02 ENCOUNTER — Encounter: Payer: 59 | Admitting: Student

## 2022-03-04 ENCOUNTER — Other Ambulatory Visit (HOSPITAL_COMMUNITY): Payer: Self-pay

## 2022-03-15 ENCOUNTER — Other Ambulatory Visit (HOSPITAL_COMMUNITY): Payer: Self-pay

## 2022-03-29 ENCOUNTER — Other Ambulatory Visit (HOSPITAL_COMMUNITY): Payer: Self-pay

## 2022-03-31 ENCOUNTER — Encounter: Payer: Self-pay | Admitting: Infectious Diseases

## 2022-04-06 ENCOUNTER — Other Ambulatory Visit (HOSPITAL_COMMUNITY): Payer: Self-pay

## 2022-04-10 ENCOUNTER — Other Ambulatory Visit (HOSPITAL_COMMUNITY): Payer: Self-pay

## 2022-04-12 ENCOUNTER — Other Ambulatory Visit (HOSPITAL_COMMUNITY): Payer: Self-pay

## 2022-04-14 ENCOUNTER — Encounter: Payer: Self-pay | Admitting: Infectious Diseases

## 2022-04-14 ENCOUNTER — Other Ambulatory Visit: Payer: Self-pay

## 2022-04-14 ENCOUNTER — Other Ambulatory Visit (HOSPITAL_COMMUNITY): Payer: Self-pay

## 2022-04-14 ENCOUNTER — Ambulatory Visit: Payer: 59 | Admitting: Infectious Diseases

## 2022-04-14 VITALS — BP 124/73 | HR 59 | Temp 97.8°F | Resp 24 | Ht 66.0 in | Wt 185.5 lb

## 2022-04-14 DIAGNOSIS — M7542 Impingement syndrome of left shoulder: Secondary | ICD-10-CM

## 2022-04-14 DIAGNOSIS — I10 Essential (primary) hypertension: Secondary | ICD-10-CM

## 2022-04-14 DIAGNOSIS — Z79899 Other long term (current) drug therapy: Secondary | ICD-10-CM

## 2022-04-14 DIAGNOSIS — K219 Gastro-esophageal reflux disease without esophagitis: Secondary | ICD-10-CM | POA: Diagnosis not present

## 2022-04-14 DIAGNOSIS — Z23 Encounter for immunization: Secondary | ICD-10-CM | POA: Diagnosis not present

## 2022-04-14 DIAGNOSIS — B351 Tinea unguium: Secondary | ICD-10-CM | POA: Insufficient documentation

## 2022-04-14 DIAGNOSIS — B20 Human immunodeficiency virus [HIV] disease: Secondary | ICD-10-CM

## 2022-04-14 DIAGNOSIS — Z113 Encounter for screening for infections with a predominantly sexual mode of transmission: Secondary | ICD-10-CM

## 2022-04-14 DIAGNOSIS — F332 Major depressive disorder, recurrent severe without psychotic features: Secondary | ICD-10-CM

## 2022-04-14 DIAGNOSIS — N529 Male erectile dysfunction, unspecified: Secondary | ICD-10-CM

## 2022-04-14 MED ORDER — OLMESARTAN MEDOXOMIL 20 MG PO TABS
20.0000 mg | ORAL_TABLET | Freq: Every day | ORAL | 3 refills | Status: DC
  Filled 2022-04-14: qty 90, 90d supply, fill #0
  Filled 2022-05-10: qty 30, 30d supply, fill #0
  Filled 2022-06-07: qty 30, 30d supply, fill #1
  Filled 2022-07-05: qty 30, 30d supply, fill #2
  Filled 2022-08-02 – 2022-11-17 (×2): qty 30, 30d supply, fill #3

## 2022-04-14 MED ORDER — SILDENAFIL CITRATE 100 MG PO TABS
ORAL_TABLET | ORAL | 1 refills | Status: DC
  Filled 2022-04-14: qty 10, 10d supply, fill #0
  Filled 2022-11-17 – 2022-11-19 (×2): qty 10, 10d supply, fill #1

## 2022-04-14 MED ORDER — TERBINAFINE HCL 250 MG PO TABS
250.0000 mg | ORAL_TABLET | Freq: Every day | ORAL | 0 refills | Status: DC
  Filled 2022-04-14: qty 31, 31d supply, fill #0
  Filled 2022-05-10: qty 11, 11d supply, fill #1

## 2022-04-14 NOTE — Assessment & Plan Note (Signed)
Working on marriage therapy.

## 2022-04-14 NOTE — Assessment & Plan Note (Signed)
Normotensive His BP rx is refilled.

## 2022-04-14 NOTE — Assessment & Plan Note (Signed)
PPI refilled. asx

## 2022-04-14 NOTE — Assessment & Plan Note (Signed)
He will repeat his surgery after new year.

## 2022-04-14 NOTE — Assessment & Plan Note (Signed)
He is doing well Flu shot today Offered/refused condoms.  Will check his labs today No change in art rtc in 9 months.

## 2022-04-14 NOTE — Assessment & Plan Note (Signed)
Will give him 1 month of terbinafine.  Warned of potential interaction with prozac.  rtc in 9 months Asked to call if any difficulties.

## 2022-04-14 NOTE — Assessment & Plan Note (Signed)
viagra refilled Offered/refused condoms.
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
## 2022-04-15 LAB — CBC
Hematocrit: 46.1 % (ref 37.5–51.0)
Hemoglobin: 16 g/dL (ref 13.0–17.7)
MCH: 30.4 pg (ref 26.6–33.0)
MCHC: 34.7 g/dL (ref 31.5–35.7)
MCV: 88 fL (ref 79–97)
Platelets: 320 10*3/uL (ref 150–450)
RBC: 5.27 x10E6/uL (ref 4.14–5.80)
RDW: 14.5 % (ref 11.6–15.4)
WBC: 7 10*3/uL (ref 3.4–10.8)

## 2022-04-15 LAB — COMPREHENSIVE METABOLIC PANEL
ALT: 19 IU/L (ref 0–44)
AST: 17 IU/L (ref 0–40)
Albumin/Globulin Ratio: 2.2 (ref 1.2–2.2)
Albumin: 4.8 g/dL (ref 4.1–5.1)
Alkaline Phosphatase: 80 IU/L (ref 44–121)
BUN/Creatinine Ratio: 11 (ref 9–20)
BUN: 11 mg/dL (ref 6–24)
Bilirubin Total: 0.4 mg/dL (ref 0.0–1.2)
CO2: 22 mmol/L (ref 20–29)
Calcium: 9.4 mg/dL (ref 8.7–10.2)
Chloride: 101 mmol/L (ref 96–106)
Creatinine, Ser: 1.02 mg/dL (ref 0.76–1.27)
Globulin, Total: 2.2 g/dL (ref 1.5–4.5)
Glucose: 96 mg/dL (ref 70–99)
Potassium: 4.3 mmol/L (ref 3.5–5.2)
Sodium: 139 mmol/L (ref 134–144)
Total Protein: 7 g/dL (ref 6.0–8.5)
eGFR: 94 mL/min/{1.73_m2} (ref >59)

## 2022-04-15 LAB — T-HELPER CELLS (CD4) COUNT (NOT AT ARMC)
CD4 % Helper T Cell: 42 % (ref 33–65)
CD4 T Cell Abs: 974 /uL (ref 400–1790)

## 2022-04-15 LAB — HIV-1 RNA QUANT-NO REFLEX-BLD: HIV-1 RNA Viral Load: <20 copies/mL

## 2022-04-28 ENCOUNTER — Other Ambulatory Visit (HOSPITAL_COMMUNITY): Payer: Self-pay

## 2022-04-28 ENCOUNTER — Other Ambulatory Visit: Payer: Self-pay

## 2022-04-28 ENCOUNTER — Encounter: Payer: Self-pay | Admitting: Infectious Diseases

## 2022-04-28 ENCOUNTER — Ambulatory Visit (INDEPENDENT_AMBULATORY_CARE_PROVIDER_SITE_OTHER): Payer: 59 | Admitting: Infectious Diseases

## 2022-04-28 VITALS — BP 107/77 | HR 85 | Temp 98.2°F | Ht 66.0 in | Wt 180.0 lb

## 2022-04-28 DIAGNOSIS — B20 Human immunodeficiency virus [HIV] disease: Secondary | ICD-10-CM

## 2022-04-28 DIAGNOSIS — I809 Phlebitis and thrombophlebitis of unspecified site: Secondary | ICD-10-CM | POA: Diagnosis not present

## 2022-04-28 DIAGNOSIS — F1124 Opioid dependence with opioid-induced mood disorder: Secondary | ICD-10-CM

## 2022-04-28 NOTE — Assessment & Plan Note (Signed)
Mild but will give him rx for keflex  Will see him back in 7 days.
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
## 2022-04-28 NOTE — Assessment & Plan Note (Signed)
Spend most of appt counseling pt I re-assured him that his missed meds should not be a problem He expresses interest in [place] due to adherence issues.  Will pit him on list to start in dec 2023.

## 2022-04-28 NOTE — Assessment & Plan Note (Signed)
Pt with pt to have him seen at [facility] in am He contracts for safety He will spend tonight with his mom.  Have reached out to psych as well about [facility].

## 2022-04-29 ENCOUNTER — Ambulatory Visit (HOSPITAL_COMMUNITY)
Admission: EM | Admit: 2022-04-29 | Discharge: 2022-04-29 | Disposition: A | Discharge status: Home / Self Care | Payer: 59 | Attending: Psychiatry | Admitting: Psychiatry

## 2022-04-29 DIAGNOSIS — F411 Generalized anxiety disorder: Secondary | ICD-10-CM | POA: Diagnosis not present

## 2022-04-29 DIAGNOSIS — F1910 Other psychoactive substance abuse, uncomplicated: Secondary | ICD-10-CM

## 2022-04-29 MED ORDER — OLANZAPINE 5 MG PO TABS: 5.0000 mg | ORAL_TABLET | Freq: Every day | ORAL | Status: DC

## 2022-04-29 MED ORDER — OLANZAPINE 5 MG PO TABS
5.0000 mg | ORAL_TABLET | Freq: Every day | ORAL | Status: DC
  Filled 2022-04-29: qty 7

## 2022-04-29 NOTE — ED Notes (Signed)
Patient discharged by provider Darrick Grinder, NP with written and verbal instructions.

## 2022-04-29 NOTE — Discharge Instructions (Addendum)
Please follow up with outpatient medication management and counseling. 

## 2022-04-29 NOTE — ED Provider Notes (Signed)
Behavioral Health Urgent Care Medical Screening Exam  Patient Name: Jimmy Fisher MRN: [id number] Date of Evaluation: 04/29/22 Chief Complaint:   Diagnosis:  Final diagnoses:  Polysubstance abuse ([place])  Anxiety state   History of Present illness: Jimmy Fisher is a 41 y.o. male. Pt presents voluntarily to [facility] behavioral health for walk-in assessment.  Pt is assessed face-to-face by nurse practitioner.   Jimmy Fisher, 41 y.o., male patient seen face to face by this provider, and chart reviewed on 04/29/22.  On evaluation Jimmy Fisher reports he is presenting today for "emotional dysregulation" which he reports has been going on "for my whole life". He reports emotional lability. He describes getting very angry. He states one day he believes his wife is the most amazing wife and the next day he can't stand her. He states he is currently staying at a hotel and he wanted to get into a fight with another individual for looking at him the wrong way.  Pt reports he "was a bad drug addict". He reports past history of heroin, crack, meth abuse. He reports recent relapse. He reports on Thursday of last week he began drinking alcohol. He had 1.5 pints of alcohol on Thursday and Friday. He reports getting into an argument with his wife on Friday after she smelled alcohol on him. He states he packed up his bags and drove to [place]. He spent $3000 while in [place] on hotels and drugs. He reports using crack/cocaine and methamphetamines while in [place]. He believes he used about 14g of crack/cocaine and 4g of methamphetamine from Friday to Monday. He reports when he returned home his wife kicked him out of the house and changed the locks on his doors. He states his wife and children are refusing to talk to him.  Pt reports since Thursday he believes he has been sleeping 2.5 hours/night. He feels anxious energy during the day. He reports he is currently staying at a hotel  where his belongings are at.   Pt denies SI/VI/HI, AVH, paranoia. He reports he was experiencing paranoia during the weekend when he thought people were chasing him. He attributes this to substance use.   Pt reports hx of inpatient psychiatric hospitalizations, last 4 years ago. He reports hx of SAs, last 25 years ago. He reports hx of NSSIB last in 2021, cutting.   Pt is requesting outpatient psychiatric medication management resources. He reports he already has therapy/counseling in place. He is seeing Jenny Reichmann for individual therapy and is seeing Marya Amsler for marriage counseling. He has therapy weekly. He reports he is also involved in group therapy. He is getting the schedule for group therapy today. Pt verbally contracts to safety. He is forward thinking, states he needs to go to his hotel and figure out his living arrangements moving forward. He also cannot miss any more work. 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 He denies access to a firearm or other weapon.  Discussed recommendation for outpatient medication management and counseling. Resources provided. Discussed providing 7 day sample for zyprexa 5mg  qhs. Indications, SEs/AEs, black box warning discussed with pt. Pt gives verbal consent for zyprexa 5mg .   Pt is a&ox3, in no acute distress, non-toxic appearing. He is casually dressed, fairly groomed, appropriate for environment. He makes fair eye contact. He appears restless and agitated during assessment. His speech is pressured, clear and coherent w/ nml volume. His reported mood is anxious and his  affect is congruent. TP is coherent, goal directed, linear, w/ description of associations intact. TC is logical. Objectively there is no evidence he is responding to internal stimuli. No delusions or paranoia elicited.   Flowsheet Row ED from 11/10/2021 in [facility] ED from 09/13/2020 in [facility]PT ED to Hosp-Admission (Discharged) from 07/16/2020 in [place] PERIOPERATIVE AREA  C-SSRS RISK CATEGORY No Risk No Risk No Risk      Psychiatric Specialty Exam  Presentation  General Appearance:Appropriate for Environment; Casual; Fairly Groomed  Eye Contact:Fair  Speech:Pressured; Clear and Coherent  Speech Volume:Normal  Handedness:No data recorded  Mood and Affect  Mood: Anxious  Affect: Congruent   Thought Process  Thought Processes: Coherent; Goal Directed; Linear  Descriptions of Associations:Intact  Orientation:Full (Time, Place and Person)  Thought Content:Logical    Hallucinations:None  Ideas of Reference:None  Suicidal Thoughts:No  Homicidal Thoughts:No   Sensorium  Memory: Immediate Good  Judgment: Fair  Insight: Fair   Art therapist  Concentration: Fair  Attention Span: Fair  Recall: [employer] of Knowledge: Fair  Language: Fair   Psychomotor Activity  Psychomotor Activity: Restlessness   Assets  Assets: Manufacturing systems engineer; Desire for Improvement; Financial Resources/Insurance; Physical Health; Resilience; Social Support; Transportation; Vocational/Educational   Sleep  Sleep: Poor  Number of hours: No data recorded  No data recorded  Physical Exam: Physical Exam Constitutional:      General: He is not in acute distress.    Appearance: Normal appearance. He is not ill-appearing, toxic-appearing or diaphoretic.  Eyes:     General: No scleral icterus. Cardiovascular:     Rate and Rhythm: Normal rate.  Pulmonary:     Effort: Pulmonary effort is normal. No respiratory distress.  Skin:    General: Skin is warm and dry.  Neurological:     Mental Status: He is alert and oriented to person, place, and time.  Psychiatric:        Attention and Perception: Attention and perception normal.        Mood and Affect: Mood is anxious.        Speech: Speech is rapid and pressured.        Behavior:  Behavior normal. Behavior is cooperative.        Thought Content: Thought content normal.    Review of Systems  Constitutional:  Negative for chills and fever.  Respiratory:  Negative for shortness of breath.   Cardiovascular:  Negative for chest pain and palpitations.  Gastrointestinal:  Negative for abdominal pain.  Neurological:  Negative for headaches.  Psychiatric/Behavioral:  Positive for substance abuse. The patient is nervous/anxious and has insomnia.    Blood pressure (!) 138/93, pulse 72, temperature 98.2 F (36.8 C), temperature source Oral, SpO2 100 %. There is no height or weight on file to calculate BMI.  Musculoskeletal: Strength & Muscle Tone: within normal limits Gait & Station: normal Patient leans: N/A  [facility] MSE Discharge Disposition for Follow up and Recommendations: Based on my evaluation the patient does not appear to have an emergency medical condition and can be discharged with resources and follow up care in outpatient services for Medication Management and Individual Therapy  Lauree Chandler, NP 04/29/2022, 5:52 PM

## 2022-04-29 NOTE — Progress Notes (Signed)
The patient states that he is feeling on edge has been on drugs all his life states that he is currently clean. Patient denies Drug use, HI and SI, Patient states that he has tried to kill him self in the past but not at this moment. Patient is very well groomed/ patient is routine and looking for medication managment as well someone to talk to on a regular basis.Patient reports having HIV.

## 2022-04-29 NOTE — Telephone Encounter (Signed)
Called pt to f/u Went to VM Left VM asking him to call back.

## 2022-04-30 ENCOUNTER — Other Ambulatory Visit (HOSPITAL_COMMUNITY): Payer: Self-pay

## 2022-05-03 ENCOUNTER — Other Ambulatory Visit (HOSPITAL_COMMUNITY): Payer: Self-pay

## 2022-05-04 ENCOUNTER — Encounter: Payer: 59 | Admitting: Infectious Diseases

## 2022-05-10 ENCOUNTER — Other Ambulatory Visit: Payer: Self-pay | Admitting: Infectious Diseases

## 2022-05-10 ENCOUNTER — Other Ambulatory Visit (HOSPITAL_COMMUNITY): Payer: Self-pay

## 2022-05-10 DIAGNOSIS — B351 Tinea unguium: Secondary | ICD-10-CM

## 2022-05-10 MED ORDER — TERBINAFINE HCL 250 MG PO TABS
250.0000 mg | ORAL_TABLET | Freq: Every day | ORAL | 0 refills | Status: AC
  Filled 2022-05-20: qty 30, 30d supply, fill #0
  Filled 2022-06-19: qty 12, 12d supply, fill #1

## 2022-05-12 ENCOUNTER — Other Ambulatory Visit (HOSPITAL_COMMUNITY): Payer: Self-pay

## 2022-05-12 ENCOUNTER — Other Ambulatory Visit: Payer: Self-pay | Admitting: Student

## 2022-05-12 DIAGNOSIS — K219 Gastro-esophageal reflux disease without esophagitis: Secondary | ICD-10-CM

## 2022-05-12 MED ORDER — OMEPRAZOLE 20 MG PO CPDR
20.0000 mg | DELAYED_RELEASE_CAPSULE | Freq: Two times a day (BID) | ORAL | 0 refills | Status: DC
  Filled 2022-05-12: qty 60, 30d supply, fill #0
  Filled 2022-07-22: qty 60, 30d supply, fill #1
  Filled 2022-11-15: qty 60, 30d supply, fill #2

## 2022-05-13 IMAGING — DX DG HIP (WITH OR WITHOUT PELVIS) 2V BILAT
5 series · 5 of 5 positions shown · non-contrast
Comparison: None.

CLINICAL DATA: Bilateral hip pain

Evaluate for ankylosing spondylitis
EXAM:
LUMBAR SPINE - COMPLETE 4+ VIEW; DG HIP (WITH OR WITHOUT PELVIS) 2V
BILAT

[pelvis ap]
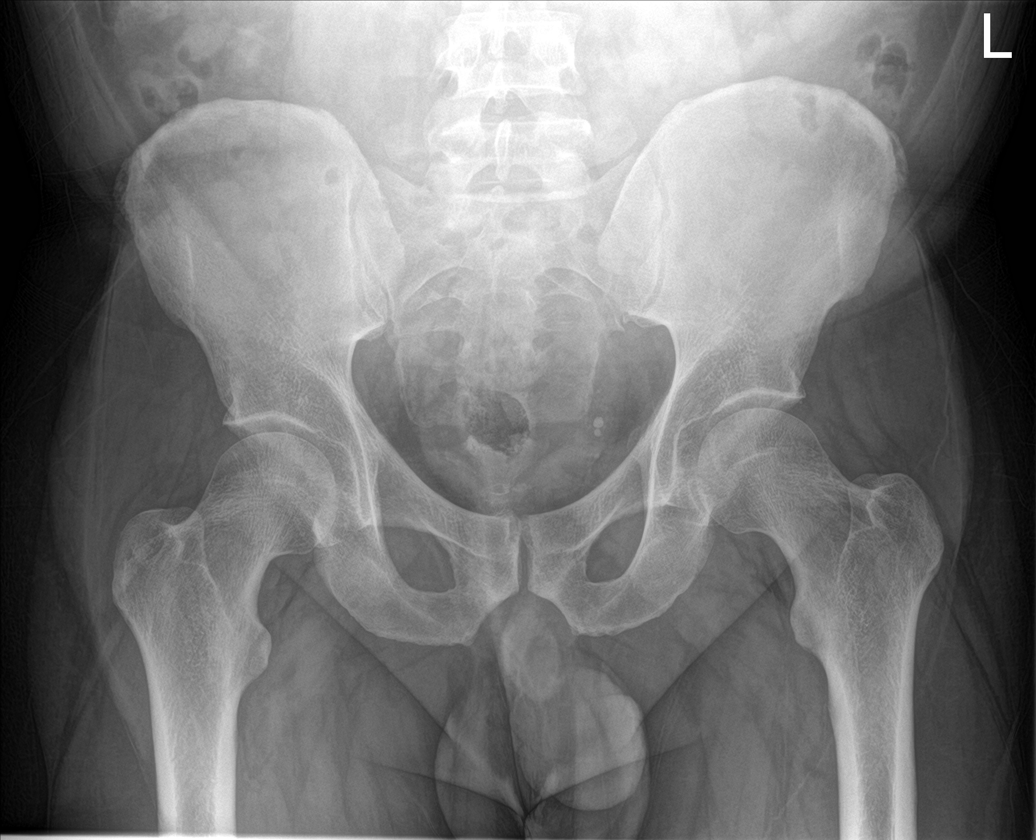

[hip ap (1 of 2)]
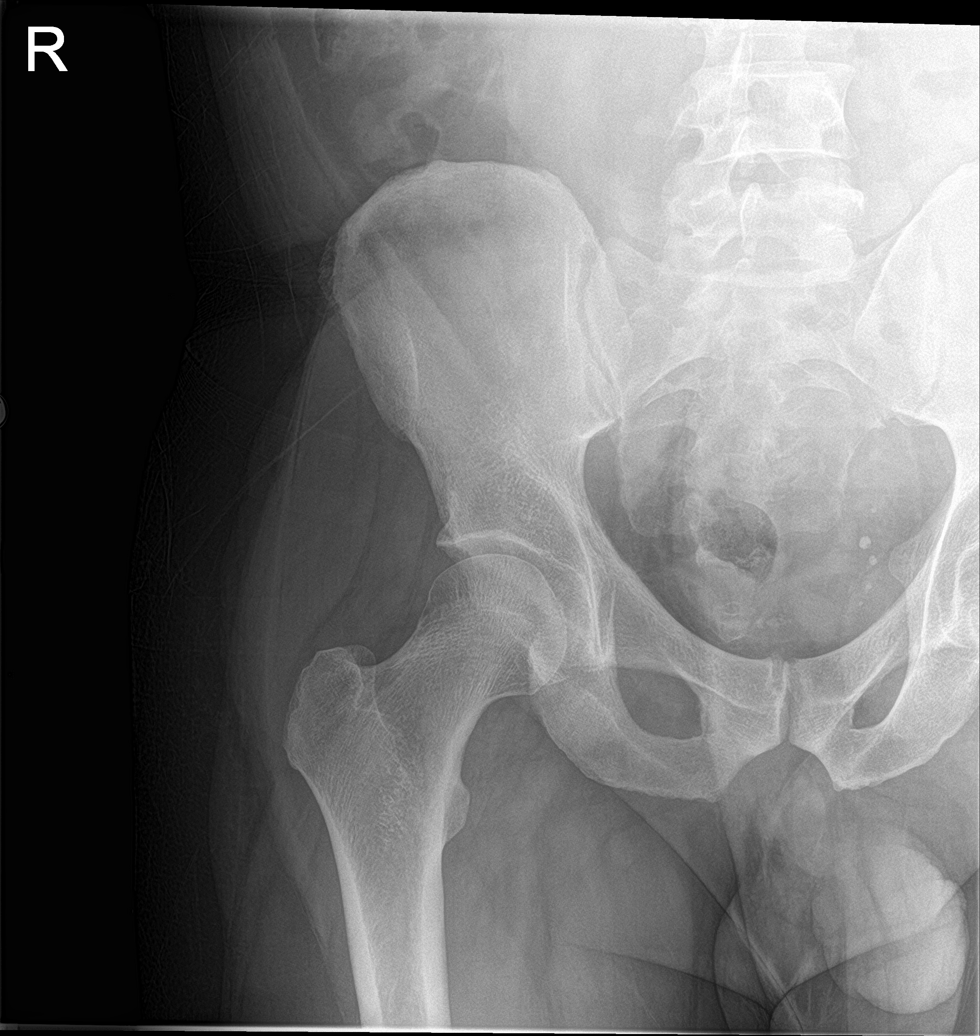

[hip lat (1 of 2)]
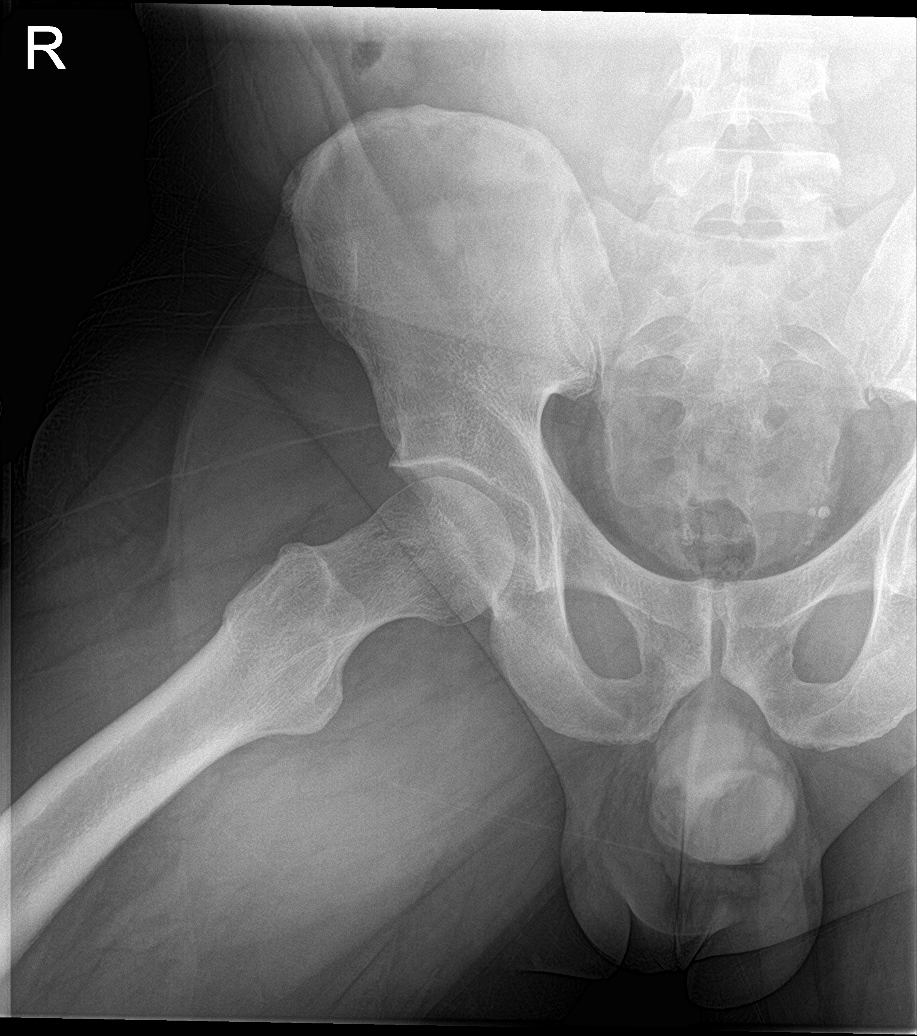

[hip ap (2 of 2)]
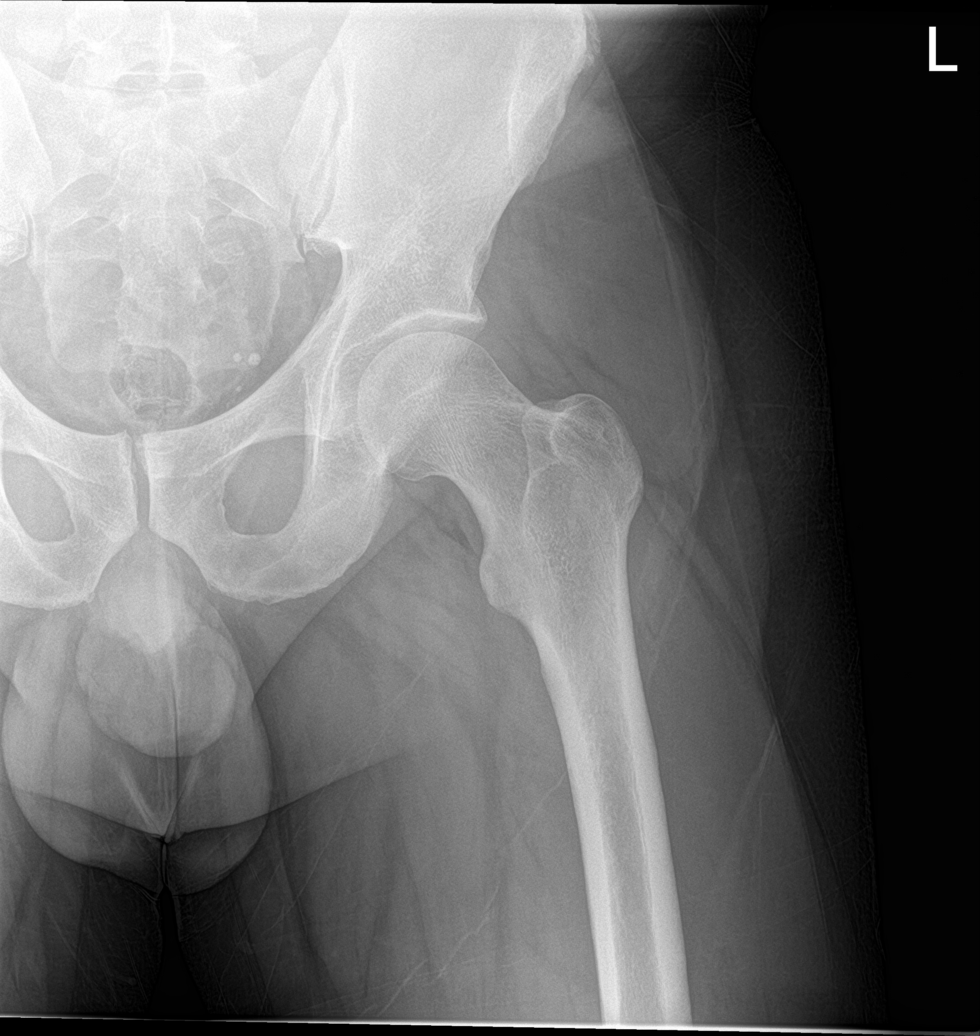

[hip lat (2 of 2)]
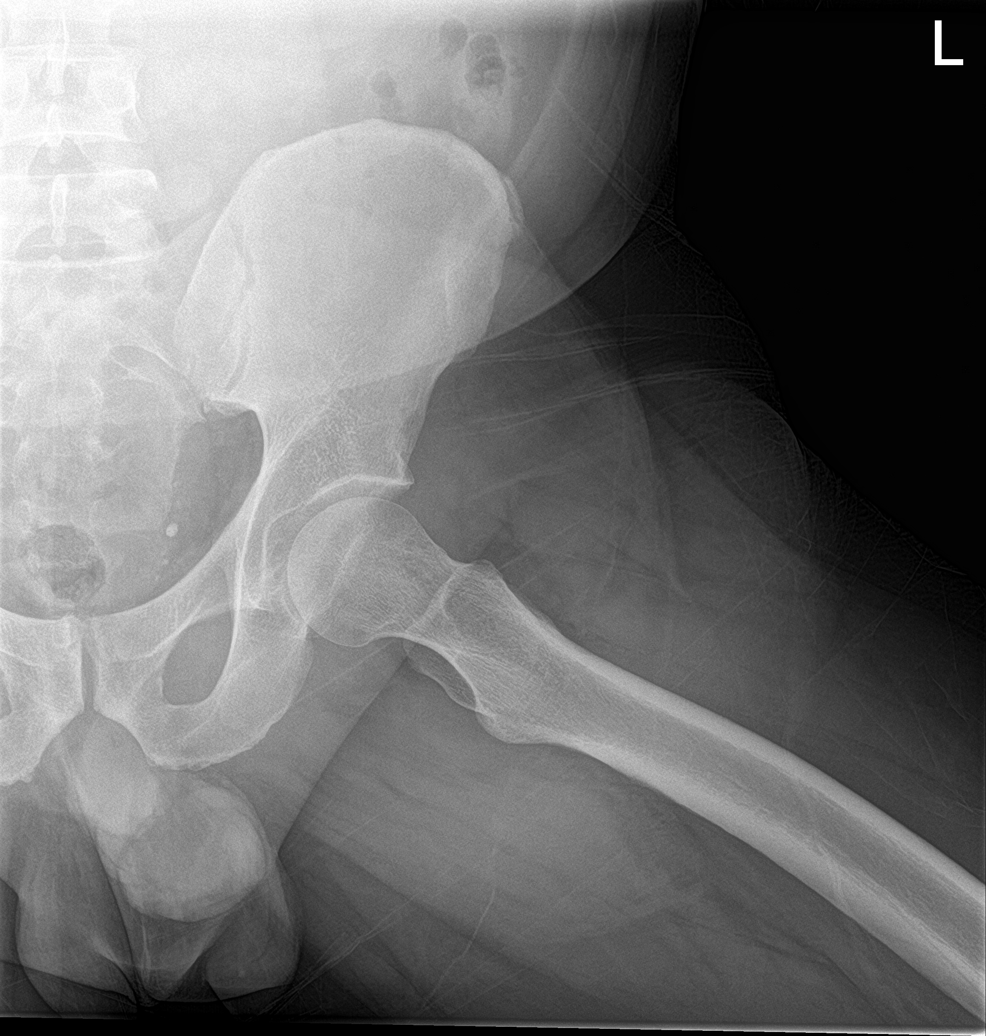

[5 of 5 positions shown; findings below may reference images not displayed]

FINDINGS: Hips: Sacroiliac joints are normal in appearance. No evidence of
fusion. No evidence of hip arthropathy. Soft tissues are
unremarkable.

Lumbar Spine: Alignment within normal limits. No significant disc or
vertebral body height loss. There is likely bilateral chronic pars
defects at L4 without significant listhesis.
IMPRESSION: No radiographic evidence of ankylosing spondylitis.

## 2022-05-13 IMAGING — DX DG LUMBAR SPINE COMPLETE 4+V
5 series · 5 of 5 positions shown · non-contrast
Comparison: None.

CLINICAL DATA: Bilateral hip pain

Evaluate for ankylosing spondylitis
EXAM:
LUMBAR SPINE - COMPLETE 4+ VIEW; DG HIP (WITH OR WITHOUT PELVIS) 2V
BILAT

[l-spine ap]
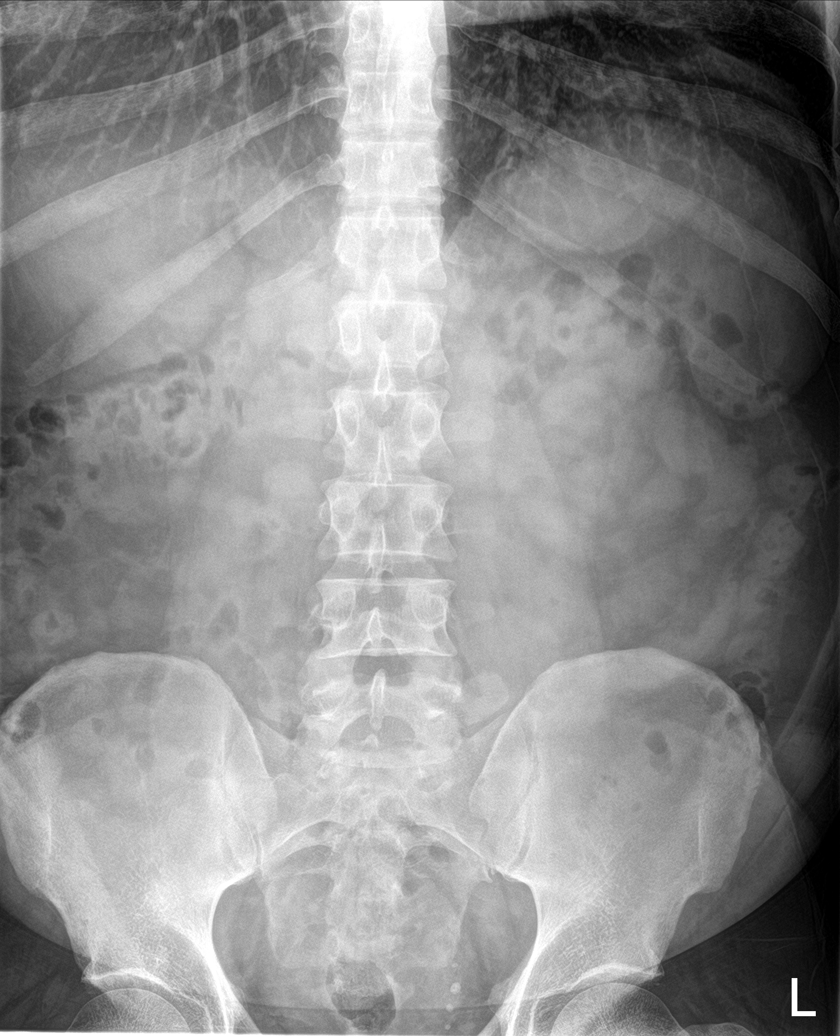

[l-spine obl (1 of 2)]
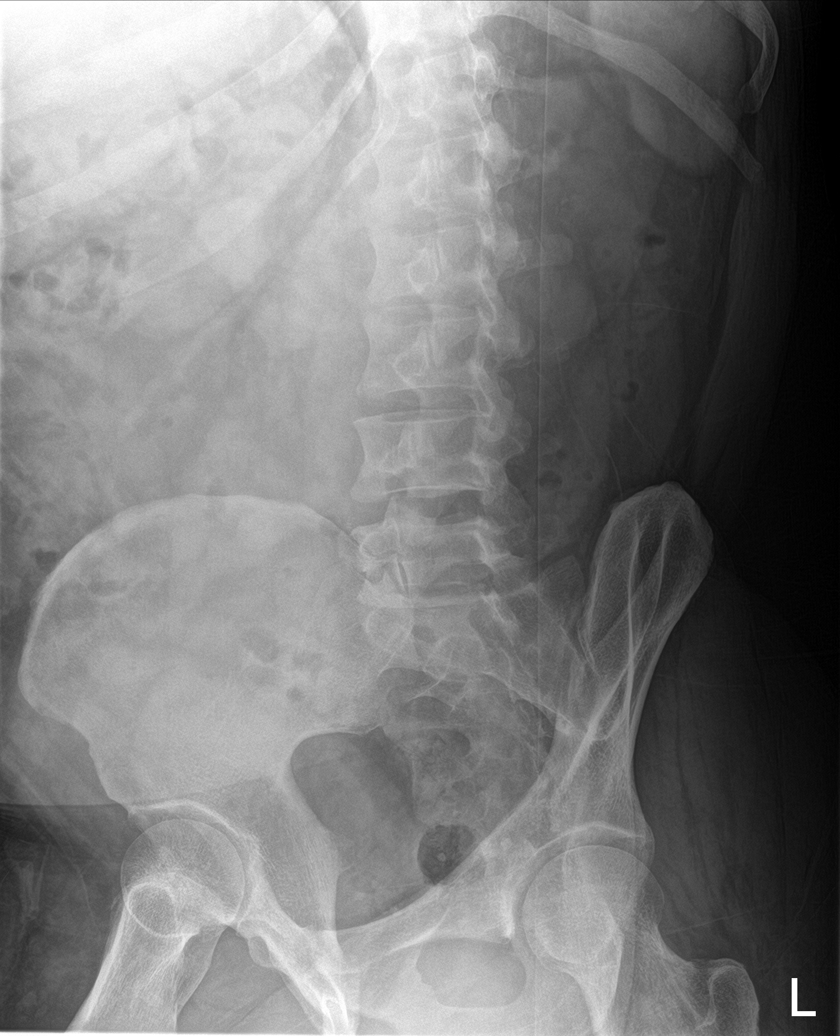

[l-spine obl (2 of 2)]
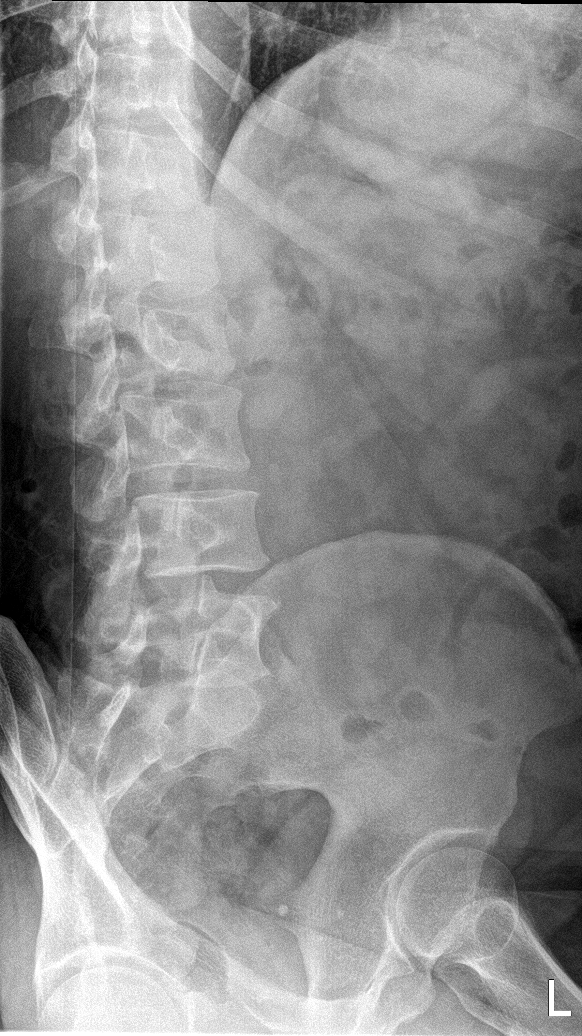

[l-spine lat]
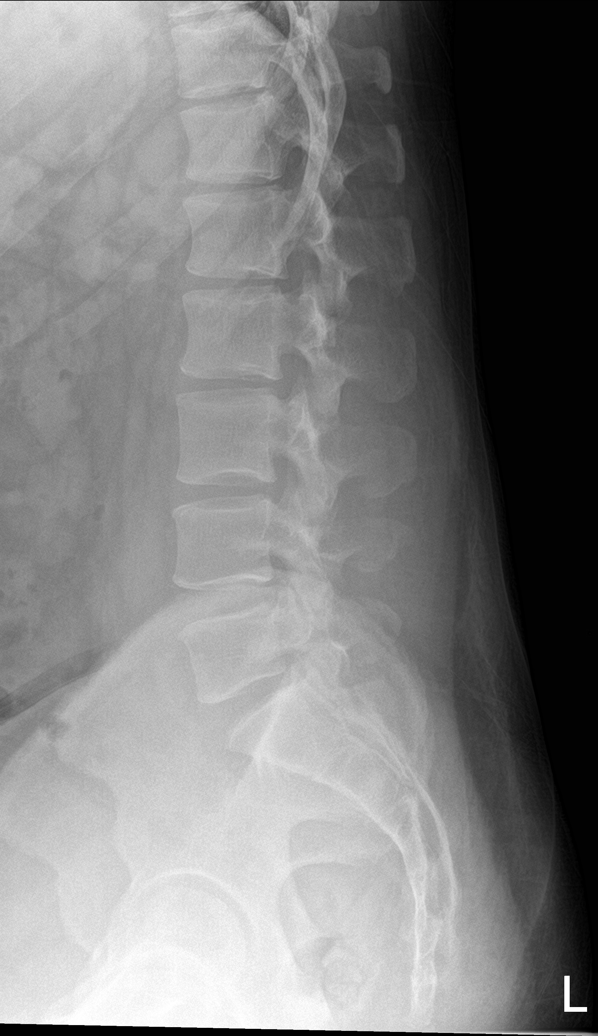

[l-spine spot]
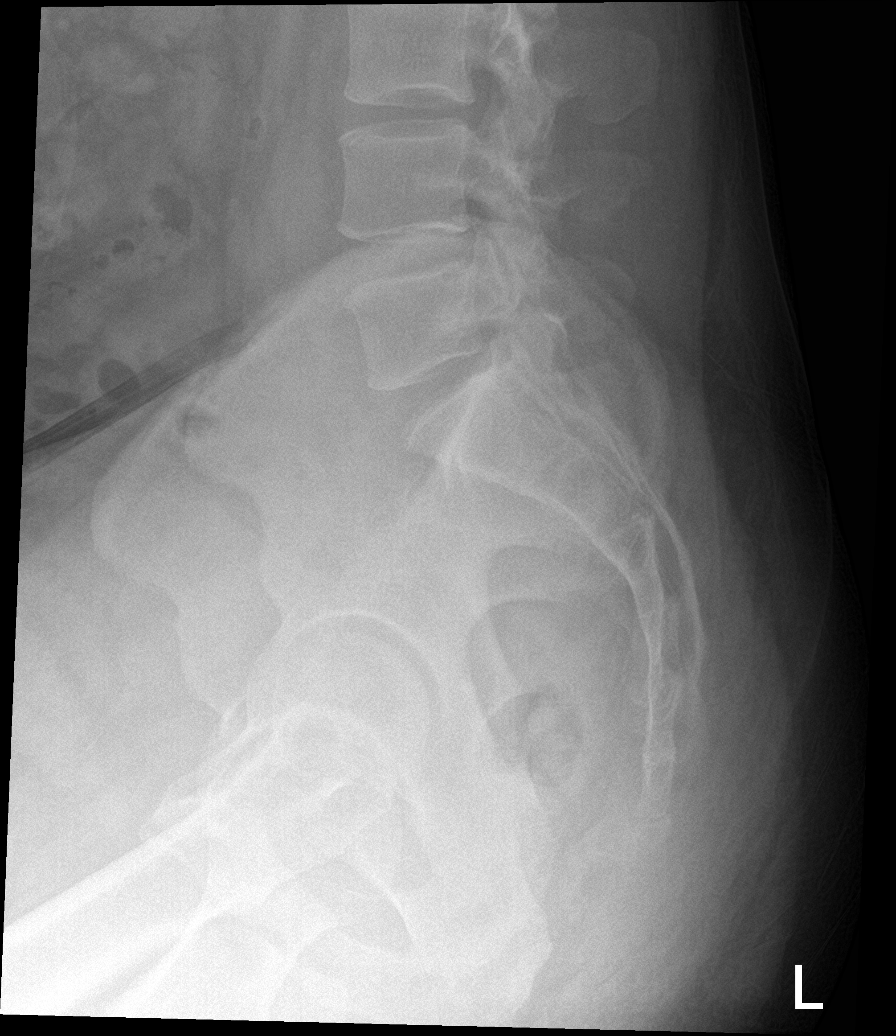

[5 of 5 positions shown; findings below may reference images not displayed]

FINDINGS: Hips: Sacroiliac joints are normal in appearance. No evidence of
fusion. No evidence of hip arthropathy. Soft tissues are
unremarkable.

Lumbar Spine: Alignment within normal limits. No significant disc or
vertebral body height loss. There is likely bilateral chronic pars
defects at L4 without significant listhesis.
IMPRESSION: No radiographic evidence of ankylosing spondylitis.

## 2022-05-14 ENCOUNTER — Encounter: Payer: Self-pay | Admitting: Infectious Diseases

## 2022-05-14 NOTE — Telephone Encounter (Signed)
Called pt about his note for rehab.

## 2022-05-18 ENCOUNTER — Encounter: Payer: Self-pay | Admitting: Infectious Diseases

## 2022-05-18 ENCOUNTER — Encounter: Payer: 59 | Admitting: Infectious Diseases

## 2022-05-19 ENCOUNTER — Other Ambulatory Visit (HOSPITAL_COMMUNITY): Payer: Self-pay

## 2022-05-19 ENCOUNTER — Other Ambulatory Visit: Payer: Self-pay

## 2022-05-20 ENCOUNTER — Other Ambulatory Visit (HOSPITAL_COMMUNITY): Payer: Self-pay

## 2022-05-26 ENCOUNTER — Other Ambulatory Visit (HOSPITAL_COMMUNITY): Payer: Self-pay

## 2022-05-26 ENCOUNTER — Other Ambulatory Visit: Payer: Self-pay

## 2022-05-27 ENCOUNTER — Other Ambulatory Visit: Payer: Self-pay

## 2022-06-08 ENCOUNTER — Other Ambulatory Visit: Payer: Self-pay

## 2022-06-15 ENCOUNTER — Other Ambulatory Visit (HOSPITAL_COMMUNITY): Payer: Self-pay

## 2022-06-19 ENCOUNTER — Other Ambulatory Visit (HOSPITAL_COMMUNITY): Payer: Self-pay

## 2022-06-23 ENCOUNTER — Encounter: Payer: Self-pay | Admitting: Infectious Diseases

## 2022-06-23 ENCOUNTER — Other Ambulatory Visit (HOSPITAL_COMMUNITY): Payer: Self-pay

## 2022-06-23 ENCOUNTER — Other Ambulatory Visit: Payer: Self-pay | Admitting: Infectious Diseases

## 2022-06-23 DIAGNOSIS — L0292 Furuncle, unspecified: Secondary | ICD-10-CM

## 2022-06-23 MED ORDER — SULFAMETHOXAZOLE-TRIMETHOPRIM 400-80 MG PO TABS
1.0000 | ORAL_TABLET | Freq: Two times a day (BID) | ORAL | 0 refills | Status: AC
  Filled 2022-06-23 – 2022-06-24 (×2): qty 20, 10d supply, fill #0

## 2022-06-24 ENCOUNTER — Encounter (HOSPITAL_COMMUNITY): Payer: Self-pay

## 2022-06-24 ENCOUNTER — Other Ambulatory Visit (HOSPITAL_COMMUNITY): Payer: Self-pay

## 2022-06-24 ENCOUNTER — Other Ambulatory Visit: Payer: Self-pay

## 2022-06-28 ENCOUNTER — Other Ambulatory Visit: Payer: Self-pay | Admitting: Infectious Diseases

## 2022-06-28 ENCOUNTER — Other Ambulatory Visit (HOSPITAL_COMMUNITY): Payer: Self-pay

## 2022-06-28 DIAGNOSIS — B20 Human immunodeficiency virus [HIV] disease: Secondary | ICD-10-CM

## 2022-06-28 MED ORDER — BIKTARVY 50-200-25 MG PO TABS
1.0000 | ORAL_TABLET | Freq: Every day | ORAL | 5 refills | Status: DC
  Filled 2022-06-28: qty 30, 30d supply, fill #0
  Filled 2022-07-26 – 2022-08-26 (×3): qty 30, 30d supply, fill #1
  Filled 2022-11-15: qty 30, 30d supply, fill #2
  Filled 2022-12-17 – 2022-12-20 (×3): qty 30, 30d supply, fill #3
  Filled 2023-01-10: qty 30, 30d supply, fill #4
  Filled 2023-02-08: qty 30, 30d supply, fill #5

## 2022-06-29 ENCOUNTER — Other Ambulatory Visit (HOSPITAL_COMMUNITY): Payer: Self-pay

## 2022-07-01 ENCOUNTER — Other Ambulatory Visit: Payer: Self-pay

## 2022-07-05 ENCOUNTER — Telehealth: Payer: Self-pay | Admitting: *Deleted

## 2022-07-05 NOTE — Telephone Encounter (Signed)
Call to patient to see if form was received.  Message left for patient t call the Clinics.

## 2022-07-06 ENCOUNTER — Other Ambulatory Visit: Payer: Self-pay

## 2022-07-06 ENCOUNTER — Encounter: Payer: Self-pay | Admitting: Student

## 2022-07-07 ENCOUNTER — Encounter: Payer: Self-pay | Admitting: Infectious Diseases

## 2022-07-07 ENCOUNTER — Other Ambulatory Visit: Payer: Self-pay

## 2022-07-07 ENCOUNTER — Other Ambulatory Visit (HOSPITAL_COMMUNITY): Payer: Self-pay

## 2022-07-07 MED ORDER — AMANTADINE HCL 100 MG PO CAPS
100.0000 mg | ORAL_CAPSULE | Freq: Three times a day (TID) | ORAL | 0 refills | Status: DC
  Filled 2022-07-07: qty 90, 30d supply, fill #0

## 2022-07-07 MED ORDER — OLANZAPINE 5 MG PO TABS
5.0000 mg | ORAL_TABLET | Freq: Every day | ORAL | 0 refills | Status: DC
  Filled 2022-07-07: qty 30, 30d supply, fill #0
  Filled 2022-07-31 – 2022-11-15 (×2): qty 30, 30d supply, fill #1
  Filled 2022-12-20: qty 30, 30d supply, fill #2

## 2022-07-08 ENCOUNTER — Other Ambulatory Visit (HOSPITAL_COMMUNITY): Payer: Self-pay

## 2022-07-14 NOTE — Telephone Encounter (Signed)
Called pt and his paperwork is all completed.  His mental health has improved.  Has not spoken to his wife or kids yet.  Had zyprexa added by his psychiatrist and feels like this is helping.

## 2022-07-21 ENCOUNTER — Other Ambulatory Visit (HOSPITAL_COMMUNITY): Payer: Self-pay

## 2022-07-21 ENCOUNTER — Telehealth: Payer: Self-pay | Admitting: *Deleted

## 2022-07-21 ENCOUNTER — Inpatient Hospital Stay: Payer: Self-pay | Admitting: Infectious Diseases

## 2022-07-21 NOTE — Telephone Encounter (Signed)
Call placed to patient for today's missed appt. No answer. Left message on VM requesting return call.  

## 2022-07-23 ENCOUNTER — Other Ambulatory Visit (HOSPITAL_BASED_OUTPATIENT_CLINIC_OR_DEPARTMENT_OTHER): Payer: Self-pay

## 2022-07-26 ENCOUNTER — Other Ambulatory Visit (HOSPITAL_COMMUNITY): Payer: Self-pay

## 2022-07-27 ENCOUNTER — Other Ambulatory Visit: Payer: Self-pay

## 2022-07-27 ENCOUNTER — Other Ambulatory Visit (HOSPITAL_COMMUNITY): Payer: Self-pay

## 2022-07-30 ENCOUNTER — Other Ambulatory Visit (HOSPITAL_COMMUNITY): Payer: Self-pay

## 2022-08-02 ENCOUNTER — Other Ambulatory Visit (HOSPITAL_COMMUNITY): Payer: Self-pay

## 2022-08-02 MED ORDER — CLINDAMYCIN HCL 150 MG PO CAPS
300.0000 mg | ORAL_CAPSULE | Freq: Three times a day (TID) | ORAL | 0 refills | Status: DC
  Filled 2022-08-02 – 2022-12-01 (×3): qty 42, 7d supply, fill #0

## 2022-08-03 ENCOUNTER — Encounter (HOSPITAL_COMMUNITY): Payer: Self-pay

## 2022-08-03 ENCOUNTER — Other Ambulatory Visit (HOSPITAL_COMMUNITY): Payer: Self-pay

## 2022-08-04 ENCOUNTER — Other Ambulatory Visit: Payer: Self-pay

## 2022-08-05 ENCOUNTER — Other Ambulatory Visit: Payer: Self-pay

## 2022-08-06 ENCOUNTER — Other Ambulatory Visit: Payer: Self-pay

## 2022-08-12 ENCOUNTER — Other Ambulatory Visit (HOSPITAL_COMMUNITY): Payer: Self-pay

## 2022-08-13 ENCOUNTER — Other Ambulatory Visit (HOSPITAL_COMMUNITY): Payer: Self-pay

## 2022-08-13 ENCOUNTER — Other Ambulatory Visit: Payer: Self-pay

## 2022-08-13 ENCOUNTER — Encounter (HOSPITAL_COMMUNITY): Payer: Self-pay

## 2022-08-15 ENCOUNTER — Other Ambulatory Visit (HOSPITAL_COMMUNITY): Payer: Self-pay

## 2022-08-16 ENCOUNTER — Other Ambulatory Visit (HOSPITAL_COMMUNITY): Payer: Self-pay

## 2022-08-17 ENCOUNTER — Other Ambulatory Visit: Payer: Self-pay

## 2022-08-19 ENCOUNTER — Other Ambulatory Visit (HOSPITAL_COMMUNITY): Payer: Self-pay

## 2022-08-23 ENCOUNTER — Other Ambulatory Visit (HOSPITAL_COMMUNITY): Payer: Self-pay

## 2022-08-23 ENCOUNTER — Encounter: Payer: Self-pay | Admitting: Infectious Diseases

## 2022-08-24 NOTE — Telephone Encounter (Signed)
Called pt to connect him to "Advancing [employer]" [employer] pt assistance program.  [phone] I also asked him to call us back.  thanks

## 2022-08-25 ENCOUNTER — Other Ambulatory Visit (HOSPITAL_COMMUNITY): Payer: Self-pay

## 2022-08-26 ENCOUNTER — Other Ambulatory Visit (HOSPITAL_COMMUNITY): Payer: Self-pay

## 2022-09-13 ENCOUNTER — Other Ambulatory Visit (HOSPITAL_COMMUNITY): Payer: Self-pay

## 2022-09-14 ENCOUNTER — Other Ambulatory Visit (HOSPITAL_COMMUNITY): Payer: Self-pay

## 2022-09-20 ENCOUNTER — Other Ambulatory Visit (HOSPITAL_COMMUNITY): Payer: Self-pay

## 2022-09-27 ENCOUNTER — Encounter: Payer: Self-pay | Admitting: *Deleted

## 2022-10-08 ENCOUNTER — Telehealth: Payer: Self-pay

## 2022-10-08 NOTE — Telephone Encounter (Signed)
Called pt left message asking if he would want to come sign up for Medicaid.

## 2022-10-12 ENCOUNTER — Ambulatory Visit: Payer: Self-pay | Admitting: Physician Assistant

## 2022-11-15 ENCOUNTER — Other Ambulatory Visit (HOSPITAL_COMMUNITY): Payer: Self-pay

## 2022-11-16 ENCOUNTER — Other Ambulatory Visit: Payer: Self-pay

## 2022-11-16 ENCOUNTER — Other Ambulatory Visit (HOSPITAL_COMMUNITY): Payer: Self-pay

## 2022-11-16 ENCOUNTER — Telehealth: Payer: Self-pay

## 2022-11-16 NOTE — Telephone Encounter (Signed)
Patient called stated that he is needing to be seen that he was seeing Dr. Ninetta Lights, but his availability is slim. Stated that he has concerns of his levels, for he has not taken his medication in 3 months. He did not clarify if there were side effect concerns or what prevented it to be taken. Was able to schedule patient to follow up with Dr. Thedore Mins 11/17/2022.

## 2022-11-17 ENCOUNTER — Other Ambulatory Visit (HOSPITAL_COMMUNITY)
Admission: RE | Admit: 2022-11-17 | Discharge: 2022-11-17 | Disposition: A | Discharge status: Home / Self Care | Payer: Medicaid Other | Source: Ambulatory Visit | Attending: Internal Medicine | Admitting: Internal Medicine

## 2022-11-17 ENCOUNTER — Other Ambulatory Visit: Payer: Self-pay

## 2022-11-17 ENCOUNTER — Other Ambulatory Visit (HOSPITAL_COMMUNITY): Payer: Self-pay

## 2022-11-17 ENCOUNTER — Encounter: Payer: Self-pay | Admitting: Internal Medicine

## 2022-11-17 ENCOUNTER — Ambulatory Visit (INDEPENDENT_AMBULATORY_CARE_PROVIDER_SITE_OTHER): Payer: Medicaid Other | Admitting: Internal Medicine

## 2022-11-17 VITALS — BP 133/84 | HR 66 | Temp 98.1°F | Wt 165.4 lb

## 2022-11-17 DIAGNOSIS — B20 Human immunodeficiency virus [HIV] disease: Secondary | ICD-10-CM | POA: Insufficient documentation

## 2022-11-17 MED ORDER — DOXYCYCLINE HYCLATE 100 MG PO TABS
100.0000 mg | ORAL_TABLET | Freq: Two times a day (BID) | ORAL | 0 refills | Status: AC
  Filled 2022-11-17: qty 14, 7d supply, fill #0
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
## 2022-11-18 LAB — T-HELPER CELLS (CD4) COUNT (NOT AT ARMC)
CD4 % Helper T Cell: 24 % — ABNORMAL LOW (ref 33–65)
CD4 T Cell Abs: 536 /uL (ref 400–1790)

## 2022-11-18 LAB — URINE CYTOLOGY ANCILLARY ONLY
Chlamydia: NEGATIVE
Comment: NEGATIVE
Comment: NORMAL
Neisseria Gonorrhea: NEGATIVE

## 2022-11-19 ENCOUNTER — Other Ambulatory Visit (HOSPITAL_COMMUNITY): Payer: Self-pay

## 2022-11-19 ENCOUNTER — Other Ambulatory Visit: Payer: Self-pay

## 2022-11-20 LAB — COMPLETE METABOLIC PANEL WITH GFR
AG Ratio: 1.3 (calc) (ref 1.0–2.5)
ALT: 30 U/L (ref 9–46)
AST: 27 U/L (ref 10–40)
Albumin: 3.8 g/dL (ref 3.6–5.1)
Alkaline phosphatase (APISO): 98 U/L (ref 36–130)
BUN/Creatinine Ratio: 6 (calc) (ref 6–22)
BUN: 6 mg/dL — ABNORMAL LOW (ref 7–25)
CO2: 25 mmol/L (ref 20–32)
Calcium: 9.1 mg/dL (ref 8.6–10.3)
Chloride: 104 mmol/L (ref 98–110)
Creat: 0.94 mg/dL (ref 0.60–1.29)
Globulin: 2.9 g/dL (calc) (ref 1.9–3.7)
Glucose, Bld: 89 mg/dL (ref 65–99)
Potassium: 4.4 mmol/L (ref 3.5–5.3)
Sodium: 140 mmol/L (ref 135–146)
Total Bilirubin: 0.3 mg/dL (ref 0.2–1.2)
Total Protein: 6.7 g/dL (ref 6.1–8.1)
eGFR: 103 mL/min/{1.73_m2} (ref >=60)

## 2022-11-20 LAB — QUANTIFERON-TB GOLD PLUS
Mitogen-NIL: 8.12 IU/mL
NIL: 0.05 IU/mL
QuantiFERON-TB Gold Plus: NEGATIVE
TB1-NIL: 0 IU/mL
TB2-NIL: 0.01 IU/mL

## 2022-11-20 LAB — CBC WITH DIFFERENTIAL/PLATELET
Absolute Monocytes: 590 cells/uL (ref 200–950)
Basophils Absolute: 60 cells/uL (ref 0–200)
Basophils Relative: 0.9 %
Eosinophils Absolute: 201 cells/uL (ref 15–500)
Eosinophils Relative: 3 %
HCT: 45.6 % (ref 38.5–50.0)
Hemoglobin: 15.3 g/dL (ref 13.2–17.1)
Lymphs Abs: 2660 cells/uL (ref 850–3900)
MCH: 28.2 pg (ref 27.0–33.0)
MCHC: 33.6 g/dL (ref 32.0–36.0)
MCV: 84 fL (ref 80.0–100.0)
MPV: 9.2 fL (ref 7.5–12.5)
Monocytes Relative: 8.8 %
Neutro Abs: 3189 cells/uL (ref 1500–7800)
Neutrophils Relative %: 47.6 %
Platelets: 300 10*3/uL (ref 140–400)
RBC: 5.43 10*6/uL (ref 4.20–5.80)
RDW: 13.1 % (ref 11.0–15.0)
Total Lymphocyte: 39.7 %
WBC: 6.7 10*3/uL (ref 3.8–10.8)

## 2022-11-20 LAB — HIV-1 RNA QUANT-NO REFLEX-BLD
HIV 1 RNA Quant: 53900 Copies/mL — ABNORMAL HIGH
HIV-1 RNA Quant, Log: 4.73 Log cps/mL — ABNORMAL HIGH

## 2022-11-20 LAB — HEPATITIS A ANTIBODY, TOTAL: Hepatitis A AB,Total: NONREACTIVE

## 2022-11-20 LAB — RPR: RPR Ser Ql: NONREACTIVE

## 2022-11-20 LAB — HEPATITIS B SURFACE ANTIBODY,QUALITATIVE: Hep B S Ab: NONREACTIVE

## 2022-11-20 LAB — HEPATITIS C ANTIBODY: Hepatitis C Ab: NONREACTIVE

## 2022-11-24 NOTE — Progress Notes (Signed)
The 10-year ASCVD risk score (Arnett DK, et al., 2019) is: 10.9%   Values used to calculate the score:     Age: 42 years     Sex: Male     Is Non-Hispanic African American: No     Diabetic: No     Tobacco smoker: Yes     Systolic Blood Pressure: 133 mmHg     Is BP treated: Yes     HDL Cholesterol: 39 mg/dL     Total Cholesterol: 230 mg/dL  Sandie Ano, RN

## 2022-12-01 ENCOUNTER — Other Ambulatory Visit (HOSPITAL_COMMUNITY): Payer: Self-pay

## 2022-12-01 ENCOUNTER — Other Ambulatory Visit: Payer: Self-pay | Admitting: Infectious Diseases

## 2022-12-01 ENCOUNTER — Other Ambulatory Visit: Payer: Self-pay

## 2022-12-01 DIAGNOSIS — N529 Male erectile dysfunction, unspecified: Secondary | ICD-10-CM

## 2022-12-02 ENCOUNTER — Other Ambulatory Visit (HOSPITAL_COMMUNITY): Payer: Self-pay

## 2022-12-02 ENCOUNTER — Other Ambulatory Visit: Payer: Self-pay

## 2022-12-02 MED ORDER — SILDENAFIL CITRATE 100 MG PO TABS
ORAL_TABLET | ORAL | 1 refills | Status: DC
  Filled 2022-12-02: qty 10, 10d supply, fill #0

## 2022-12-03 ENCOUNTER — Telehealth: Payer: Self-pay

## 2022-12-03 NOTE — Telephone Encounter (Signed)
Patient called stating that he is trying to be admitted into [facility]. [facility] requested that he see a doctor prior to being admitted due to rash on abdomen and legs. Patient states that he is currently taking Doxycycline for meth bites? Per Thedore Mins last note. Patient scheduled for 30 minute visit on 6/24 with Dr.Van Dam.    Marcell Anger, CMA

## 2022-12-05 ENCOUNTER — Encounter: Payer: Self-pay | Admitting: Infectious Disease

## 2022-12-05 DIAGNOSIS — F1510 Other stimulant abuse, uncomplicated: Secondary | ICD-10-CM | POA: Insufficient documentation

## 2022-12-05 DIAGNOSIS — L509 Urticaria, unspecified: Secondary | ICD-10-CM | POA: Insufficient documentation

## 2022-12-05 HISTORY — DX: Other stimulant abuse, uncomplicated: F15.10

## 2022-12-05 HISTORY — DX: Urticaria, unspecified: L50.9

## 2022-12-05 NOTE — Progress Notes (Unsigned)
Subjective:   Chief complaint: diffuse rash    Patient ID: Jimmy Fisher, male    DOB: [birth year], 42 y.o.   MRN: [id number]  HPI  42 year old with HIV disease, who stopped meds in contextof recurrence of seere depression, polysubstance abuse who had lesions at last visit with Dr. Thedore Mins concerning for "meth mites" on doxycycline.    Past Medical History:  Diagnosis Date   Anxiety    Depression    DVT (deep venous thrombosis) (HCC)    occurred during hospitalization for overdose   Fibromyalgia    GERD (gastroesophageal reflux disease)    Hiatal hernia    HIV (human immunodeficiency virus infection) (HCC)    Hypercholesteremia    Hypertension    Neuromuscular disorder (HCC)    fibromyalgia   Pulmonary embolism (HCC)    2012   SLAP tear of shoulder    left    Past Surgical History:  Procedure Laterality Date   COLONOSCOPY     I & D EXTREMITY Left 07/16/2020   Procedure: IRRIGATION AND DEBRIDEMENT FOREARM;  Surgeon: Bradly Bienenstock, MD;  [facility];  Service: Orthopedics;  Laterality: Left;   SHOULDER ARTHROSCOPY WITH BICEPS TENDON REPAIR Left 10/22/2020   Procedure: LEFT SHOULDER ARTHROSCOPY, BICEPS TENODESIS;  Surgeon: Tarry Kos, MD;  Location: [facility];  Service: Orthopedics;  Laterality: Left;   SUBACROMIAL DECOMPRESSION Left 10/22/2020   Procedure: SUBACROMIAL DECOMPRESSION;  Surgeon: Tarry Kos, MD;  Location: [facility];  Service: Orthopedics;  Laterality: Left;   UPPER GI ENDOSCOPY      Family History  Problem Relation Age of Onset   Fibromyalgia Mother    Cancer Father       Social History   Socioeconomic History   Marital status: Married    Spouse name: Not on file   Number of children: 2   Years of education: Not on file   Highest education level: Not on file  Occupational History   Not on file  Tobacco Use   Smoking status: Every Day    Types: E-cigarettes   Smokeless tobacco: Former    Types: Snuff    Tobacco comments:    Vipes  Vaping Use   Vaping Use: Every day   Substances: Nicotine  Substance and Sexual Activity   Alcohol use: Not Currently    Comment: quit 06/2020   Drug use: Not on file    Comment: feb 2022   Sexual activity: Yes    Comment: declined condoms  Other Topics Concern   Not on file  Social History Narrative   Right Handed    Has two sons    Lives in a one story home    Social Determinants of Health   Financial Resource Strain: Not on file  Food Insecurity: Not on file  Transportation Needs: Not on file  Physical Activity: Not on file  Stress: Not on file  Social Connections: Not on file    No Known Allergies   Current Outpatient Medications:    amantadine (SYMMETREL) 100 MG capsule, Take 1 capsule (100 mg total) by mouth 3 (three) times daily. (Patient not taking: Reported on 11/17/2022), Disp: 90 capsule, Rfl: 0   bictegravir-emtricitabine-tenofovir AF (BIKTARVY) 50-200-25 MG TABS tablet, Take 1 tablet by mouth daily., Disp: 30 tablet, Rfl: 5   cetirizine (ZYRTEC) 10 MG tablet, Take 10 mg by mouth daily. (Patient not taking: Reported on 11/17/2022), Disp: , Rfl:    clindamycin (CLEOCIN)  150 MG capsule, Take 2 capsules (300 mg total) by mouth 3 (three) times daily. (Patient not taking: Reported on 11/17/2022), Disp: 42 capsule, Rfl: 0   FLUoxetine (PROZAC) 40 MG capsule, Take 1 capsule (40 mg total) by mouth daily., Disp: 90 capsule, Rfl: 3   OLANZapine (ZYPREXA) 5 MG tablet, Take 1 tablet (5 mg total) by mouth at bedtime., Disp: , Rfl:    OLANZapine (ZYPREXA) 5 MG tablet, Take 1 tablet (5 mg total) by mouth at bedtime., Disp: 90 tablet, Rfl: 0   olmesartan (BENICAR) 20 MG tablet, Take 1 tablet (20 mg total) by mouth daily. (Patient not taking: Reported on 11/17/2022), Disp: 90 tablet, Rfl: 3   omeprazole (PRILOSEC) 20 MG capsule, Take 1 capsule (20 mg total) by mouth 2 (two) times daily., Disp: 180 capsule, Rfl: 0   sildenafil (VIAGRA) 100 MG tablet, Take 1   tablet by mouth daily as needed for intercourse. Please call provider or go to ED if erection lasting greater than 8 hours., Disp: 10 tablet, Rfl: 1   traMADol (ULTRAM) 50 MG tablet, tramadol 50 mg tablet, Disp: , Rfl:     Review of Systems     Objective:   Physical Exam        Assessment & Plan:   Rash: thought secondary to pruritus in context of methamphetamine abuse  Recheck RPR with prozone, CMP, CBC c diff   Methamphetamine abuse:  Needs clearance to go to     HIV disease:  I will add order HIV viral load CD4 count CBC with differential CMP, RPR GC and chlamydia and I will continue  Reginia Naas prescription  Vaccine counseling: he would benefit from hep B and A vaccinatino

## 2022-12-06 ENCOUNTER — Other Ambulatory Visit: Payer: Self-pay

## 2022-12-06 ENCOUNTER — Other Ambulatory Visit (HOSPITAL_COMMUNITY): Payer: Self-pay

## 2022-12-06 ENCOUNTER — Ambulatory Visit (INDEPENDENT_AMBULATORY_CARE_PROVIDER_SITE_OTHER): Payer: Medicaid Other | Admitting: Infectious Disease

## 2022-12-06 ENCOUNTER — Encounter: Payer: Self-pay | Admitting: Infectious Disease

## 2022-12-06 VITALS — BP 129/89 | HR 58 | Temp 97.3°F | Ht 66.0 in | Wt 163.0 lb

## 2022-12-06 DIAGNOSIS — L509 Urticaria, unspecified: Secondary | ICD-10-CM | POA: Diagnosis not present

## 2022-12-06 DIAGNOSIS — F411 Generalized anxiety disorder: Secondary | ICD-10-CM | POA: Diagnosis not present

## 2022-12-06 DIAGNOSIS — R233 Spontaneous ecchymoses: Secondary | ICD-10-CM

## 2022-12-06 DIAGNOSIS — B20 Human immunodeficiency virus [HIV] disease: Secondary | ICD-10-CM

## 2022-12-06 DIAGNOSIS — I10 Essential (primary) hypertension: Secondary | ICD-10-CM | POA: Diagnosis not present

## 2022-12-06 DIAGNOSIS — F1510 Other stimulant abuse, uncomplicated: Secondary | ICD-10-CM

## 2022-12-06 DIAGNOSIS — L270 Generalized skin eruption due to drugs and medicaments taken internally: Secondary | ICD-10-CM

## 2022-12-08 ENCOUNTER — Encounter (HOSPITAL_COMMUNITY): Payer: Self-pay

## 2022-12-08 ENCOUNTER — Other Ambulatory Visit (HOSPITAL_COMMUNITY): Payer: Self-pay

## 2022-12-09 ENCOUNTER — Other Ambulatory Visit: Payer: Self-pay

## 2022-12-09 ENCOUNTER — Other Ambulatory Visit: Payer: Self-pay | Admitting: Infectious Diseases

## 2022-12-09 DIAGNOSIS — K219 Gastro-esophageal reflux disease without esophagitis: Secondary | ICD-10-CM

## 2022-12-09 LAB — CBC WITH DIFFERENTIAL/PLATELET
Absolute Monocytes: 670 cells/uL (ref 200–950)
Basophils Absolute: 78 cells/uL (ref 0–200)
Basophils Relative: 0.9 %
Eosinophils Absolute: 383 cells/uL (ref 15–500)
Eosinophils Relative: 4.4 %
HCT: 43.8 % (ref 38.5–50.0)
Hemoglobin: 14.4 g/dL (ref 13.2–17.1)
Lymphs Abs: 3384 cells/uL (ref 850–3900)
MCH: 27.7 pg (ref 27.0–33.0)
MCHC: 32.9 g/dL (ref 32.0–36.0)
MCV: 84.2 fL (ref 80.0–100.0)
MPV: 8.8 fL (ref 7.5–12.5)
Monocytes Relative: 7.7 %
Neutro Abs: 4185 cells/uL (ref 1500–7800)
Neutrophils Relative %: 48.1 %
Platelets: 366 10*3/uL (ref 140–400)
RBC: 5.2 10*6/uL (ref 4.20–5.80)
RDW: 13.7 % (ref 11.0–15.0)
Total Lymphocyte: 38.9 %
WBC: 8.7 10*3/uL (ref 3.8–10.8)

## 2022-12-09 LAB — RPR: RPR Ser Ql: NONREACTIVE

## 2022-12-09 LAB — COMPLETE METABOLIC PANEL WITH GFR
AG Ratio: 1.2 (calc) (ref 1.0–2.5)
ALT: 64 U/L — ABNORMAL HIGH (ref 9–46)
AST: 51 U/L — ABNORMAL HIGH (ref 10–40)
Albumin: 3.6 g/dL (ref 3.6–5.1)
Alkaline phosphatase (APISO): 128 U/L (ref 36–130)
BUN: 12 mg/dL (ref 7–25)
CO2: 22 mmol/L (ref 20–32)
Calcium: 9.3 mg/dL (ref 8.6–10.3)
Chloride: 106 mmol/L (ref 98–110)
Creat: 0.85 mg/dL (ref 0.60–1.29)
Globulin: 3.1 g/dL (calc) (ref 1.9–3.7)
Glucose, Bld: 84 mg/dL (ref 65–99)
Potassium: 4.5 mmol/L (ref 3.5–5.3)
Sodium: 137 mmol/L (ref 135–146)
Total Bilirubin: 0.4 mg/dL (ref 0.2–1.2)
Total Protein: 6.7 g/dL (ref 6.1–8.1)
eGFR: 110 mL/min/{1.73_m2} (ref >=60)

## 2022-12-09 LAB — HIV RNA, RTPCR W/R GT (RTI, PI,INT)
HIV 1 RNA Quant: 371 copies/mL — ABNORMAL HIGH
HIV-1 RNA Quant, Log: 2.57 Log copies/mL — ABNORMAL HIGH

## 2022-12-09 MED ORDER — OMEPRAZOLE 20 MG PO CPDR
20.0000 mg | DELAYED_RELEASE_CAPSULE | Freq: Two times a day (BID) | ORAL | 0 refills | Status: DC
  Filled 2022-12-20 (×2): qty 180, 90d supply, fill #0

## 2022-12-17 ENCOUNTER — Encounter (HOSPITAL_COMMUNITY): Payer: Self-pay

## 2022-12-17 ENCOUNTER — Other Ambulatory Visit (HOSPITAL_COMMUNITY): Payer: Self-pay

## 2022-12-20 ENCOUNTER — Other Ambulatory Visit (HOSPITAL_COMMUNITY): Payer: Self-pay

## 2022-12-20 ENCOUNTER — Ambulatory Visit: Payer: No Typology Code available for payment source | Admitting: Physician Assistant

## 2022-12-20 ENCOUNTER — Encounter: Payer: Self-pay | Admitting: Physician Assistant

## 2022-12-20 ENCOUNTER — Other Ambulatory Visit: Payer: Self-pay

## 2022-12-20 VITALS — BP 125/83 | HR 71 | Ht 66.0 in | Wt 177.0 lb

## 2022-12-20 DIAGNOSIS — B20 Human immunodeficiency virus [HIV] disease: Secondary | ICD-10-CM

## 2022-12-20 DIAGNOSIS — F411 Generalized anxiety disorder: Secondary | ICD-10-CM

## 2022-12-20 DIAGNOSIS — F5105 Insomnia due to other mental disorder: Secondary | ICD-10-CM

## 2022-12-20 DIAGNOSIS — I10 Essential (primary) hypertension: Secondary | ICD-10-CM | POA: Diagnosis not present

## 2022-12-20 DIAGNOSIS — R197 Diarrhea, unspecified: Secondary | ICD-10-CM

## 2022-12-20 DIAGNOSIS — F99 Mental disorder, not otherwise specified: Secondary | ICD-10-CM

## 2022-12-20 DIAGNOSIS — M25511 Pain in right shoulder: Secondary | ICD-10-CM | POA: Diagnosis not present

## 2022-12-20 DIAGNOSIS — K219 Gastro-esophageal reflux disease without esophagitis: Secondary | ICD-10-CM

## 2022-12-20 DIAGNOSIS — F1510 Other stimulant abuse, uncomplicated: Secondary | ICD-10-CM

## 2022-12-20 DIAGNOSIS — F332 Major depressive disorder, recurrent severe without psychotic features: Secondary | ICD-10-CM

## 2022-12-20 MED ORDER — LIDOCAINE 5 % EX PTCH: 1.0000 | MEDICATED_PATCH | CUTANEOUS | 0 refills | Status: DC

## 2022-12-20 MED ORDER — OLMESARTAN MEDOXOMIL 20 MG PO TABS: 20.0000 mg | ORAL_TABLET | Freq: Every day | ORAL | 1 refills | Status: DC

## 2022-12-20 MED ORDER — FIBER 625 MG PO TABS: 1.0000 | ORAL_TABLET | Freq: Every day | ORAL | 1 refills | Status: DC
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
## 2022-12-21 ENCOUNTER — Encounter: Payer: Self-pay | Admitting: Physician Assistant

## 2022-12-23 ENCOUNTER — Ambulatory Visit: Payer: MEDICAID | Admitting: Internal Medicine

## 2022-12-27 ENCOUNTER — Encounter: Payer: Self-pay | Admitting: Physician Assistant

## 2022-12-27 ENCOUNTER — Other Ambulatory Visit: Payer: Self-pay

## 2022-12-27 ENCOUNTER — Telehealth: Payer: Self-pay | Admitting: *Deleted

## 2022-12-27 ENCOUNTER — Ambulatory Visit (INDEPENDENT_AMBULATORY_CARE_PROVIDER_SITE_OTHER): Payer: No Typology Code available for payment source | Admitting: Physician Assistant

## 2022-12-27 VITALS — BP 123/79 | HR 80 | Temp 97.7°F | Wt 161.0 lb

## 2022-12-27 DIAGNOSIS — F1910 Other psychoactive substance abuse, uncomplicated: Secondary | ICD-10-CM

## 2022-12-27 DIAGNOSIS — B20 Human immunodeficiency virus [HIV] disease: Secondary | ICD-10-CM
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
## 2022-12-27 NOTE — Patient Instructions (Signed)
Continue biktarvy Viral load drawn today Appointment with Dr. Ninetta Lights to discuss disability papers Discussion with [facility] Remember to take one day at a time, you are worth it!

## 2022-12-28 ENCOUNTER — Ambulatory Visit: Payer: MEDICAID | Admitting: Infectious Diseases

## 2022-12-29 LAB — HIV-1 RNA QUANT-NO REFLEX-BLD
HIV 1 RNA Quant: 104 Copies/mL — ABNORMAL HIGH
HIV-1 RNA Quant, Log: 2.02 Log cps/mL — ABNORMAL HIGH

## 2022-12-30 ENCOUNTER — Encounter: Payer: Self-pay | Admitting: Infectious Disease

## 2022-12-30 ENCOUNTER — Telehealth: Payer: Self-pay

## 2022-12-30 NOTE — Telephone Encounter (Signed)
Patient called to review recent lab results and to request letter for rehab facility. Informed patient that VL is considered undetectable when below 200. Current VL is  104. Patient would like letter sent to him on mychart stating that he is undetectable. Is not able to use mychart message from provider due to phrasing. Facility would not accept message due to "technically undetectable".  Will send message to [place], [place] regarding letter.  Juanita Laster, RMA

## 2022-12-31 ENCOUNTER — Encounter: Payer: Self-pay | Admitting: Infectious Diseases

## 2023-01-03 ENCOUNTER — Other Ambulatory Visit (HOSPITAL_COMMUNITY): Payer: Self-pay

## 2023-01-03 ENCOUNTER — Other Ambulatory Visit: Payer: Self-pay | Admitting: Infectious Diseases

## 2023-01-03 DIAGNOSIS — I10 Essential (primary) hypertension: Secondary | ICD-10-CM

## 2023-01-07 ENCOUNTER — Other Ambulatory Visit: Payer: Self-pay

## 2023-01-10 ENCOUNTER — Other Ambulatory Visit: Payer: Self-pay | Admitting: Infectious Diseases

## 2023-01-10 ENCOUNTER — Other Ambulatory Visit: Payer: Self-pay | Admitting: Physician Assistant

## 2023-01-10 ENCOUNTER — Other Ambulatory Visit: Payer: Self-pay

## 2023-01-10 ENCOUNTER — Other Ambulatory Visit (HOSPITAL_COMMUNITY): Payer: Self-pay

## 2023-01-10 DIAGNOSIS — K219 Gastro-esophageal reflux disease without esophagitis: Secondary | ICD-10-CM

## 2023-01-10 DIAGNOSIS — I10 Essential (primary) hypertension: Secondary | ICD-10-CM

## 2023-01-10 MED ORDER — OMEPRAZOLE 20 MG PO CPDR
20.0000 mg | DELAYED_RELEASE_CAPSULE | Freq: Two times a day (BID) | ORAL | 0 refills | Status: DC
  Filled 2023-01-10 – 2023-04-28 (×2): qty 60, 30d supply, fill #0

## 2023-01-11 ENCOUNTER — Other Ambulatory Visit: Payer: Self-pay

## 2023-01-11 ENCOUNTER — Other Ambulatory Visit (HOSPITAL_COMMUNITY): Payer: Self-pay

## 2023-01-12 ENCOUNTER — Other Ambulatory Visit: Payer: Self-pay

## 2023-01-26 ENCOUNTER — Other Ambulatory Visit (HOSPITAL_COMMUNITY): Payer: Self-pay

## 2023-01-26 MED ORDER — MELOXICAM 15 MG PO TABS
15.0000 mg | ORAL_TABLET | Freq: Every day | ORAL | 0 refills | Status: DC
  Filled 2023-01-26 – 2023-02-08 (×2): qty 30, 30d supply, fill #0

## 2023-02-04 ENCOUNTER — Other Ambulatory Visit: Payer: Self-pay | Admitting: Infectious Diseases

## 2023-02-04 DIAGNOSIS — S43432D Superior glenoid labrum lesion of left shoulder, subsequent encounter: Secondary | ICD-10-CM

## 2023-02-07 ENCOUNTER — Other Ambulatory Visit (HOSPITAL_COMMUNITY): Payer: Self-pay

## 2023-02-07 ENCOUNTER — Telehealth: Payer: Self-pay | Admitting: *Deleted

## 2023-02-07 NOTE — Telephone Encounter (Addendum)
My Chart message  From: Willman, Briggs ASent: 02/03/2023  11:22 AM EDTTo: Binnie Kand Front Desk PoolSubject: Appointment Request                          Appointment Request From: Arna Snipe Provider: Johny Sax [facility]]Preferred Date Range: AnyPreferred Times: Any TimeReason for visit: Office VisitComments:Dr. Ninetta Lights I am hoping you can move my appointment on August 28th to a sooner date. I severely injured my shoulder about two months ago and am still experiencing severe pain. I had an Orthopedic Dr appointment a few weeks ago and they prescribed a steroid and anti inflammatory that is not helping. I believe that I need an MRI on my shoulder and am wondering if that is something that you can set up for me. Thanks  MRI was ordered by Dr Ninetta Lights.  I called pt, talked to pt's mother who stated pt is at the [facility] in [place] (drug treatment center). Stated pt thought he would be back home for his MRI and doctor's appt. Stated we need to cancel these appts. She stated pt needs to have his records transferred to the center; she will tell pt to call our office. She stated being there is the best place for pt. And they can order the MRI.

## 2023-02-08 ENCOUNTER — Other Ambulatory Visit: Payer: Self-pay

## 2023-02-08 ENCOUNTER — Other Ambulatory Visit (HOSPITAL_COMMUNITY): Payer: Self-pay

## 2023-02-08 ENCOUNTER — Ambulatory Visit (HOSPITAL_COMMUNITY): Payer: MEDICAID

## 2023-02-09 ENCOUNTER — Other Ambulatory Visit (HOSPITAL_COMMUNITY): Payer: Self-pay

## 2023-02-09 ENCOUNTER — Encounter: Payer: MEDICAID | Admitting: Infectious Diseases

## 2023-02-09 ENCOUNTER — Other Ambulatory Visit: Payer: Self-pay

## 2023-02-10 ENCOUNTER — Other Ambulatory Visit (HOSPITAL_COMMUNITY): Payer: Self-pay

## 2023-02-14 ENCOUNTER — Other Ambulatory Visit: Payer: Self-pay

## 2023-02-15 ENCOUNTER — Telehealth: Payer: Self-pay | Admitting: Infectious Diseases

## 2023-02-15 NOTE — Telephone Encounter (Signed)
Rec'd phone call from the pt stating that the order that is Placed for a MRI should be for his Right Shoulder and not his left Shoulder.  The Pt is also stating that he will be able to come to [facility] to get his Imaging done as, The Rehab center in [place] has requested he needs to get the MRI done with his Provider.   [facility]  Medical diagnostic imaging center [place] Address: [address] [place], [place], [place] [address] Phone: [phone]

## 2023-02-16 ENCOUNTER — Other Ambulatory Visit: Payer: Self-pay | Admitting: Infectious Diseases

## 2023-02-16 DIAGNOSIS — M2550 Pain in unspecified joint: Secondary | ICD-10-CM

## 2023-02-25 ENCOUNTER — Encounter: Payer: Self-pay | Admitting: Infectious Diseases

## 2023-02-27 ENCOUNTER — Ambulatory Visit
Admission: RE | Admit: 2023-02-27 | Discharge: 2023-02-27 | Disposition: A | Discharge status: Home / Self Care | Payer: MEDICAID | Source: Ambulatory Visit | Attending: Infectious Diseases | Admitting: Infectious Diseases

## 2023-02-27 DIAGNOSIS — M2550 Pain in unspecified joint: Secondary | ICD-10-CM

## 2023-02-27 DIAGNOSIS — S43432D Superior glenoid labrum lesion of left shoulder, subsequent encounter: Secondary | ICD-10-CM

## 2023-03-01 ENCOUNTER — Other Ambulatory Visit (HOSPITAL_COMMUNITY): Payer: Self-pay

## 2023-03-05 ENCOUNTER — Encounter (HOSPITAL_COMMUNITY): Payer: Self-pay

## 2023-03-08 ENCOUNTER — Other Ambulatory Visit (HOSPITAL_COMMUNITY): Payer: Self-pay

## 2023-03-08 ENCOUNTER — Other Ambulatory Visit: Payer: Self-pay | Admitting: Infectious Diseases

## 2023-03-08 ENCOUNTER — Encounter: Payer: Self-pay | Admitting: Infectious Disease

## 2023-03-08 ENCOUNTER — Other Ambulatory Visit: Payer: Self-pay

## 2023-03-08 ENCOUNTER — Telehealth: Payer: Self-pay

## 2023-03-08 DIAGNOSIS — B20 Human immunodeficiency virus [HIV] disease: Secondary | ICD-10-CM

## 2023-03-08 NOTE — Telephone Encounter (Signed)
Patient stated he does not have a phone and that he is using someone else's phone, he stated he will be using the phone for about 10 more minutes.

## 2023-03-08 NOTE — Telephone Encounter (Signed)
Requesting MRI results, please call pt back @ [phone].

## 2023-03-08 NOTE — Progress Notes (Signed)
Specialty Pharmacy Refill Coordination Note  Jimmy Fisher is a 42 y.o. male contacted today regarding refills of specialty medication(s) Bictegravir-Emtricitab-Tenofov .  Patient requested [name] at [facility] at [place]  on 03/10/23   Medication is currently pending refill, medication will be filled when approved.

## 2023-03-09 ENCOUNTER — Other Ambulatory Visit (HOSPITAL_COMMUNITY): Payer: Self-pay

## 2023-03-09 ENCOUNTER — Other Ambulatory Visit: Payer: Self-pay

## 2023-03-09 MED ORDER — BIKTARVY 50-200-25 MG PO TABS
1.0000 | ORAL_TABLET | Freq: Every day | ORAL | 5 refills | Status: DC
Start: 2023-03-09 — End: 2023-04-05
  Filled 2023-03-09 – 2023-03-16 (×2): qty 30, 30d supply, fill #0

## 2023-03-16 ENCOUNTER — Other Ambulatory Visit: Payer: Self-pay

## 2023-03-16 NOTE — Progress Notes (Addendum)
Pt's mother called and stated she needed refill, but assessment was done 9/24, so encounter was opened in error. Med needs to be shipped to [facility], [address] [place]: [facility], [place], [place], [address].

## 2023-03-16 NOTE — Progress Notes (Signed)
Patient's mother called to see if anyone had picked up the prescription yet as patient is currently incarcerated and a staff member from the facility was supposed to have arranged for pickup. Advised that this ha snot occurred and she states that she will contact the facility back and either see if we should ship it out, if she should pick it up and take it there, or if we should transfer this refill. She will contact us back to let us know how to proceed.

## 2023-03-17 ENCOUNTER — Other Ambulatory Visit: Payer: Self-pay

## 2023-03-18 NOTE — Telephone Encounter (Signed)
Called mom and reviewed his MRI No major tears.  Will defer referral to ortho.  Suggested ice, rest, nsaid

## 2023-03-22 ENCOUNTER — Other Ambulatory Visit (HOSPITAL_COMMUNITY): Payer: Self-pay

## 2023-03-22 ENCOUNTER — Other Ambulatory Visit: Payer: Self-pay

## 2023-03-23 ENCOUNTER — Other Ambulatory Visit (HOSPITAL_COMMUNITY): Payer: Self-pay

## 2023-03-25 ENCOUNTER — Other Ambulatory Visit: Payer: Self-pay

## 2023-03-25 ENCOUNTER — Other Ambulatory Visit (HOSPITAL_COMMUNITY): Payer: Self-pay

## 2023-03-26 ENCOUNTER — Other Ambulatory Visit: Payer: Self-pay

## 2023-03-28 ENCOUNTER — Other Ambulatory Visit: Payer: Self-pay

## 2023-03-29 ENCOUNTER — Other Ambulatory Visit (HOSPITAL_COMMUNITY): Payer: Self-pay

## 2023-04-01 ENCOUNTER — Other Ambulatory Visit (HOSPITAL_COMMUNITY): Payer: Self-pay

## 2023-04-04 ENCOUNTER — Other Ambulatory Visit (HOSPITAL_COMMUNITY): Payer: Self-pay

## 2023-04-04 ENCOUNTER — Other Ambulatory Visit: Payer: Self-pay

## 2023-04-05 ENCOUNTER — Other Ambulatory Visit: Payer: Self-pay

## 2023-04-05 ENCOUNTER — Telehealth: Payer: Self-pay

## 2023-04-05 ENCOUNTER — Other Ambulatory Visit (HOSPITAL_COMMUNITY): Payer: Self-pay

## 2023-04-05 DIAGNOSIS — B20 Human immunodeficiency virus [HIV] disease: Secondary | ICD-10-CM

## 2023-04-05 MED ORDER — BIKTARVY 50-200-25 MG PO TABS
1.0000 | ORAL_TABLET | Freq: Every day | ORAL | 0 refills | Status: DC
Start: 2023-04-05 — End: 2023-04-28

## 2023-04-05 NOTE — Telephone Encounter (Signed)
Received Jimmy Fisher with [facility]  requesting refills on [name]. Patient is currently out of medication. Refill sent to [facility] in [place] as requested. Juanita Laster, RMA

## 2023-04-06 ENCOUNTER — Other Ambulatory Visit (HOSPITAL_COMMUNITY): Payer: Self-pay

## 2023-04-06 ENCOUNTER — Other Ambulatory Visit: Payer: Self-pay

## 2023-04-06 NOTE — Progress Notes (Signed)
Patient is now getting meds through [facility] [place]

## 2023-04-14 ENCOUNTER — Other Ambulatory Visit (HOSPITAL_COMMUNITY): Payer: Self-pay

## 2023-04-25 ENCOUNTER — Ambulatory Visit (HOSPITAL_COMMUNITY)
Admission: EM | Admit: 2023-04-25 | Discharge: 2023-04-25 | Disposition: A | Discharge status: Home / Self Care | Payer: No Typology Code available for payment source | Attending: Registered Nurse | Admitting: Registered Nurse

## 2023-04-25 ENCOUNTER — Encounter (HOSPITAL_COMMUNITY): Payer: Self-pay | Admitting: Registered Nurse

## 2023-04-25 DIAGNOSIS — F332 Major depressive disorder, recurrent severe without psychotic features: Secondary | ICD-10-CM | POA: Diagnosis present

## 2023-04-25 DIAGNOSIS — F1910 Other psychoactive substance abuse, uncomplicated: Secondary | ICD-10-CM | POA: Diagnosis present

## 2023-04-25 DIAGNOSIS — Z789 Other specified health status: Secondary | ICD-10-CM | POA: Diagnosis present

## 2023-04-25 DIAGNOSIS — F411 Generalized anxiety disorder: Secondary | ICD-10-CM | POA: Diagnosis not present

## 2023-04-25 NOTE — ED Notes (Signed)
Pt discharged with  AVS.  AVS reviewed prior to discharge.  Pt alert, oriented, and ambulatory.  Safety maintained.  °

## 2023-04-25 NOTE — ED Provider Notes (Signed)
Behavioral Health Urgent Care Medical Screening Exam  Patient Name: Jimmy Fisher MRN: [id number] Date of Evaluation: 04/25/23 Chief Complaint:  Depression and substance use resources Diagnosis:  Final diagnoses:  MDD (major depressive disorder), recurrent severe, without psychosis (HCC)  Polysubstance abuse (HCC)  Generalized anxiety disorder    History of Present illness: Jimmy Fisher is a 42 y.o. male patient presented to [facility] as a walk in with complaints of depression, anxiety, and wanting to be restarted on his medications  Jimmy Fisher, 42 y.o., male patient seen face to face by this provider, chart reviewed, and consulted with Dr. Nelly Rout on 04/25/23.  On evaluation Jimmy Fisher reports he was released from jail on Saturday, 04/23/2023 after his mother went to bond.  Patient reporting while he was in jail he was having suicidal thoughts and it called his mother and told her that if she did not get him out of jail he was going to kill himself.  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  Reports he and his wife separated a year ago and ever since separation he has gone downhill.  Reports he has lost his job and now he is homeless and jobless.  He reports that he has a polysubstance addiction and he is wanting to get into a rehab facility and also wanting to be restarted on his psychotropic medications.  Discussed admission to continuous assessment while looking for psychiatric bed for admission.  The patient states that he does not want to be admitted here states that he would prefer to go to [facility] in [place] and then requested to be discharged.  Currently patient denies suicidal/self-harm/homicidal ideation, psychosis, paranoia.  Patient reports that he has stayed with his mother for the last 2 nights but one of her demands is that he get into treatment. During evaluation Jimmy Fisher is  sitting in exam room with no noted distress.  He is alert/oriented x 4, calm, cooperative, attentive, and responses were relevant and appropriate to assessment questions.  He spoke in a clear tone at moderate volume, and normal pace, with good eye contact.   He denies suicidal/self-harm/homicidal ideation, psychosis, and paranoia.  Objectively there is no evidence of psychosis/mania or delusional thinking.  He conversed coherently, with goal directed thoughts, and no distractibility, or pre-occupation.    At this time Jimmy Fisher is educated and verbalizes understanding of mental health resources and other crisis services in the community. He is instructed to call 911 and present to the nearest emergency room should he experience any suicidal/homicidal ideation, auditory/visual/hallucinations, or detrimental worsening of his mental health condition.  He is given resources for community, substance and psychiatric services   Flowsheet Row ED from 04/25/2023 in [facility] ED from 11/10/2021 in [facility] Emergency Department at [facility] ED from 09/13/2020 in [facility] Emergency Department at [facility]  C-SSRS RISK CATEGORY Low Risk No Risk No Risk       Psychiatric Specialty Exam  Presentation  General Appearance:Appropriate for Environment  Eye Contact:Good  Speech:Clear and Coherent; Normal Rate  Speech Volume:Normal  Handedness:Right   Mood and Affect  Mood: Anxious; Depressed  Affect: Congruent   Thought Process  Thought Processes: Coherent; Goal Directed  Descriptions of Associations:Intact  Orientation:Full (Time, Place and Person)  Thought Content:Logical    Hallucinations:None  Ideas of Reference:None  Suicidal Thoughts:No  Homicidal Thoughts:No  Sensorium  Memory: Immediate Good; Recent Good; Remote Good  Judgment: Intact  Insight: Present   Executive Functions   Concentration: Good  Attention Span: Good  Recall: Good  Fund of Knowledge: Good  Language: Good   Psychomotor Activity  Psychomotor Activity: Normal   Assets  Assets: Communication Skills; Desire for Improvement; Social Support   Sleep  Sleep: Fair  Number of hours: No data recorded  Physical Exam: Physical Exam Vitals and nursing note reviewed. Exam conducted with a chaperone present.  Constitutional:      General: He is not in acute distress.    Appearance: Normal appearance. He is not ill-appearing.  HENT:     Head: Normocephalic.  Cardiovascular:     Rate and Rhythm: Normal rate.  Pulmonary:     Effort: Pulmonary effort is normal. No respiratory distress.  Musculoskeletal:        General: Normal range of motion.     Cervical back: Normal range of motion.  Skin:    General: Skin is warm and dry.  Neurological:     Mental Status: He is alert and oriented to person, place, and time.  Psychiatric:        Attention and Perception: Attention and perception normal. He does not perceive auditory or visual hallucinations.        Mood and Affect: Mood is anxious and depressed.        Speech: Speech normal.        Behavior: Behavior normal. Behavior is cooperative.        Thought Content: Thought content normal. Thought content is not paranoid or delusional. Thought content does not include homicidal or suicidal ideation.        Cognition and Memory: Cognition normal.        Judgment: Judgment is impulsive.    Review of Systems  Constitutional:        No other complaints voiced  Musculoskeletal:  Neck pain: Last use was the 03/31/2023.  Psychiatric/Behavioral:  Positive for depression and substance abuse. Negative for hallucinations. Suicidal ideas: Denies at this time but states he was having suicidal thoughts when in jail.  States mother went his bond Saturday..The patient is nervous/anxious.   All other systems reviewed and are negative.  Blood  pressure (!) 163/111, pulse 98, temperature 97.8 F (36.6 C), SpO2 99%. There is no height or weight on file to calculate BMI.  Musculoskeletal: Strength & Muscle Tone: within normal limits Gait & Station: normal Patient leans: N/A   [facility] MSE Discharge Disposition for Follow up and Recommendations: Based on my evaluation the patient does not appear to have an emergency medical condition and can be discharged with resources and follow up care in outpatient services for Medication Management, Substance Abuse Intensive Outpatient Program, and Individual Therapy   Paiton Fosco, NP 04/25/2023, 1:15 PM
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
## 2023-04-25 NOTE — Discharge Instructions (Signed)
[facility]: Outpatient psychiatric Services:   Please see the walk in hours listed below.  Medication Management New Patient needing Medication Management Walk-in, and Existing Patients needing to see a provider for management coming as a walk in   Monday thru Friday 8:00 AM first come first serve until slots are full.  Recommend being there by 7:15 AM to ensure a slot is open.  Therapy New Patient Therapy Intake and Existing Patients needing to see therapist coming in as a walk in.   Monday, Wednesday, and Thursday morning at 8:00 am first come first serve.  Recommend being there by 7:15 AM to ensure a slot is open.    Every 1st, 2nd, and 3rd Friday at 1:00 PM first come first serve until slots are full.  Will still need to come in that morning at 7:15 AM to get registered for an afternoon slot.  For all walk-ins we ask that you arrive by 7:15 am because patients will be seen in there order of arrival (FIRST COME FIRST SERVE) Availability is limited, therefore you may not be seen on the same day that you walk in if all slots are full.    Our goal is to serve and meet the needs of our community to the best of our ability.     [facility] Phone: [phone] Physical Address:  [address] [place], [place]  [address]  Outpatient Services Life can be a challenge for [place] all. [facility] outpatient services offer a caring and experienced team of professionals who help people take the first step, which is often the most difficult. Together, we develop a well-defined and customized plan for each person that meets the individual's needs and goals. Each plan includes evidence-based practices as proven strategies that work. From board-certified psychiatrists, registered nurses, therapists, and outpatient office administrative professionals--all care and want to help you and your loved ones in every way  possible to ensure you succeed.  Open Access:   One way we ensure we get people the help they need when they request is is through Open Access. This service encourages individuals who are in dire need of our services and are new to [facility] to simply walk in or call us for virtual options, Monday through Friday between 8 a.m. and 3 p.m. On the same day of contact, if the individual has time to do so, he/she/they will complete patient registration and a comprehensive clinical assessment with a therapist. The assessment will provide treatment recommendations and the individual will leave with an appointment for the next service or a referral to the proper level of care.  While this process takes a few hours and is longer than a traditional appointment, it reduces what could otherwise be months of waiting for help or an appointment.   Telehealth Services:  [facility]'s telehealth services provide a safe, secure, and easy way to connect with a therapist or mental health provider for an individual or group therapy appointment. Click here to learn more about how [facility]'s telehealth services provide an important treatment option. These services may be accessed from the comfort of an individual's home, or at one of [facility]'s behavioral health offices such as this one where an individual may use on-site equipment for the visit.   Telehealth Services   A SAFE, SECURE, CONVENIENT TREATMENT OPTION:  [facility] telehealth services provide you with a safe, secure, and easy way to connect with your therapist or mental health provider for  an individual or group therapy appointment.  Using Psychologist, prison and probation services, telehealth appointments allow you to meet with [employer], therapists, nurse practitioners, and psychiatrists from your desktop or laptop computer, cell phone, or tablet device. Telehealth visits are compliant with all Health Insurance Portability and Accountability Act (HIPAA) requirements and you can  complete a telehealth visit from just about anywhere using internet or wi-fi access.  HOW DOES IT WORK?  [facility] uses the D[employer] platform to host telehealth appointments. Prior to your scheduled visit, you will receive a direct link via text or email which will take you to your provider's online waiting room. Simply click that link at your appointment time and your provider will be notified that you've arrived. He or she will meet you online and you will complete your visit. Your provider may also have resources and information posted in his or her virtual waiting room which you may find helpful throughout your treatment.  In addition, you may receive a reminder telephone call from a [facility] team member in the days leading up to your appointment. During that call, you will have an opportunity to provide important health information and medication updates which may save time during your scheduled appointment.     WHO USES TELEHEALTH SERVICES?  Telehealth services provide an alternative to in-person, face-to-face treatment for individuals receiving outpatient behavioral health services. At [facility], telehealth visits may also be used by individuals receiving Assertive Community Treatment (ACT) Team and Individual Placement and Support (IPS) services and other community-based, specialized services as needed. Telehealth services are also used for group therapy sessions, allowing people we support to connect during treatment with others who have similar experiences.  Substance Abuse Treatment Programs  Intensive Outpatient Programs [facility]     [address]      [place], [place]                   [phone]       [facility] [address] [place], [place] [phone]  [facility]     (Inpatient and outpatient)     [address]           [phone]    [facility] [phone] (Suboxone and  Methadone)  [address]      [place], [place] [address]      [phone]       [address] [place], [place] [phone]  [facility] (Outpatient/Inpatient, [employer])    (insurance only) [phone]             [facility] (Groups & Residential) [place], [place] [phone]     [facility]     [address]     [place], [place]      [phone]       [facility] (for caregivers and family) [phone] [address] [place], [place] [phone]      Residential Treatment Programs [facility]      [address], [place], [place] [address]  [phone]       [facility] [address] [place], [place] [address] [phone]  [facility] [place]        [phone]       [facility] [phone]  [facility] ([facility]             [address]  [place], [place]                                                [phone] or [phone]                               [facility] [place] [address] [place], [address] [phone]  [facility]    [address]      [place], [place]     [phone]       [facility] [address] [place], [place] [phone]  [facility]   [address] [place], [place] [address]     [phone]      Admissions: 8am-3pm M-F  [facility] ([facility]) [address] [place], [place] [phone]  [facility]: Residential Program [phone] Days)   [place], [place]      [phone] or [phone]     ADATC: [facility] [place], [place] (Walk in Hours over the weekend or by referral)  [facility] [address] [place], [place], [place] [address] [phone]  Crisis Mobile: [facility]:  [phone] (for crisis response 24 hours a day) [facility] Hotline:       ([phone] Outpatient Psychiatry and Counseling  [facility]: Mobile Crisis Management 24 hours:  [phone]  [facility] of the [employer] sliding scale fee and walk in schedule: M-F 8am-12pm/1pm-3pm [address]  [place], [place] [address] [phone]  [facility] [address] [place], [place] [address] [phone]  [facility] (Formerly known as [facility] [employer])- new patient walk-in appointments available Monday - Friday 8am -3pm.          [address] [place], [place] [address] [phone] or crisis line- [phone]  [facility] Health Outpatient Services/ Intensive Outpatient Therapy Program [address] [place], [place] [address] [phone]  [facility]                  Crisis Services      [phone] [address]     [place], [place] [address]                 [facility]   [facility] [phone]. [address] [place], [place] [address]   [employer] [facility]          [address]  [place], [place] [address]       [phone]  [facility] [address] [place], [place] [address] [phone]  [facility]    [address]    [place], [place] [address]     [phone]       Andee Poles, MD     [address]     [place] [address]     [phone]       [facility] [address] [place] [address]  [facility]     [address]     [place], [place]      [phone]       [facility] Eulogio Ditch, [place] [address] [phone]  [place], [place] [address] [phone]  [facility]     [address]     [place], [place] [address]     [phone]       [facility] [address] [place], [place] [address] [phone] Resources  for Temporary Residential Assistance/Crisis Centers  [facility] CENT[facility]) M-F 8am-3pm   [address] [place], [place] [address]   [phone] Services include: laundry, barbering, support groups, case management, phone  & computer access, showers, AA/NA mtgs, mental health/substance abuse nurse, job skills class, disability information, VA assistance, spiritual classes, etc.   HOMELESS SHELTERS  [facility]     [facility]   [address], [place]     [phone]              [employer] (women and children)       [address] [place], [place] [address] [phone] [email or web address] for application and process Application Required  [facility]   [address]    [place] [address]     [phone]                    [facility] [place] [address] [place], [place] [address] [phone] application appt.) Application Required  [facility] (women only)    [address]     [place], [place] [address]     [phone]      Intake starts 6pm daily Need valid ID, SSC, & Police report [employer] [address] [place], [place] [phone] Application Required  [employer] (men only)     [address]      [place], [place]     [phone]       [facility] [place] (Pregnant women only) [address] [place], [place] [phone]  [facility]      [address]      [place], [place] [address]     [phone]             [facility] [address] [place], [place] [phone] 90 day commitment/SA/Application process  [facility](men only)     [address]     [place], [place]     [phone]       Check-in at [facility] [address] [place], [place]  [address] [phone] Men/Women/Women and Children must be there by 7 pm  [facility] [place], [place] [phone]
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
## 2023-04-28 ENCOUNTER — Other Ambulatory Visit: Payer: Self-pay | Admitting: Infectious Diseases

## 2023-04-28 ENCOUNTER — Encounter (HOSPITAL_COMMUNITY): Payer: Self-pay

## 2023-04-28 ENCOUNTER — Other Ambulatory Visit: Payer: Self-pay | Admitting: Pharmacist

## 2023-04-28 ENCOUNTER — Other Ambulatory Visit: Payer: Self-pay

## 2023-04-28 ENCOUNTER — Other Ambulatory Visit (HOSPITAL_COMMUNITY): Payer: Self-pay

## 2023-04-28 ENCOUNTER — Other Ambulatory Visit (HOSPITAL_BASED_OUTPATIENT_CLINIC_OR_DEPARTMENT_OTHER): Payer: Self-pay

## 2023-04-28 DIAGNOSIS — I10 Essential (primary) hypertension: Secondary | ICD-10-CM

## 2023-04-28 DIAGNOSIS — B20 Human immunodeficiency virus [HIV] disease: Secondary | ICD-10-CM

## 2023-04-28 DIAGNOSIS — F339 Major depressive disorder, recurrent, unspecified: Secondary | ICD-10-CM

## 2023-04-28 MED ORDER — OLANZAPINE 5 MG PO TABS
5.0000 mg | ORAL_TABLET | Freq: Every day | ORAL | 0 refills | Status: DC
  Filled 2023-04-28: qty 90, 90d supply, fill #0
  Filled 2023-04-28: qty 44, 44d supply, fill #0
  Filled 2023-04-28: qty 90, 90d supply, fill #0
  Filled 2023-05-17 – 2023-06-17 (×3): qty 44, 44d supply, fill #1

## 2023-04-28 MED ORDER — FLUOXETINE HCL 40 MG PO CAPS
40.0000 mg | ORAL_CAPSULE | Freq: Every day | ORAL | 3 refills | Status: DC
  Filled 2023-04-28: qty 90, 90d supply, fill #0
  Filled 2023-09-21 – 2023-09-30 (×2): qty 90, 90d supply, fill #1

## 2023-04-28 MED ORDER — BIKTARVY 50-200-25 MG PO TABS
1.0000 | ORAL_TABLET | Freq: Every day | ORAL | 0 refills | Status: DC
  Filled 2023-04-28: qty 30, 30d supply, fill #0

## 2023-04-28 MED ORDER — OLMESARTAN MEDOXOMIL 20 MG PO TABS
20.0000 mg | ORAL_TABLET | Freq: Every day | ORAL | 3 refills | Status: DC
  Filled 2023-04-28 (×3): qty 90, 90d supply, fill #0
  Filled 2023-06-17 – 2023-07-22 (×2): qty 90, 90d supply, fill #1
  Filled 2023-09-30: qty 90, 90d supply, fill #2

## 2023-04-28 NOTE — Progress Notes (Signed)
Specialty Pharmacy Initial Fill Coordination Note  Jimmy Fisher is a 42 y.o. male contacted today regarding refills of specialty medication(s) Bictegravir-Emtricitab-Tenofov   Patient requested [name] at [facility] [place] date: 04/28/23   Medication will be filled on 04/28/23.   Patient is aware of $0 copayment.

## 2023-04-28 NOTE — Progress Notes (Signed)
Specialty Pharmacy Initiation Note   Jimmy Fisher is a 42 y.o. male who will be followed by the specialty pharmacy service for RxSp HIV    Review of administration, indication, effectiveness, safety, potential side effects, storage/disposable, and missed dose instructions occurred today for patient's specialty medication(s) Bictegravir-Emtricitab-Tenofov     Patient/Caregiver did not have any additional questions or concerns.   Patient's therapy is appropriate to: Continue    Goals Addressed             This Visit's Progress    Achieve Undetectable HIV Viral Load < 20       Patient is on track. Patient will maintain adherence      Comply with lab assessments       Patient is on track. Patient will maintain adherence      Increase CD4 count until steady state       Patient is on track. Patient will maintain adherence      Maintain optimal adherence to therapy       Patient is on track. Patient will maintain adherence         Jennette Kettle Specialty Pharmacist

## 2023-04-29 ENCOUNTER — Other Ambulatory Visit (HOSPITAL_COMMUNITY): Payer: Self-pay

## 2023-05-02 ENCOUNTER — Encounter: Payer: Self-pay | Admitting: Physician Assistant

## 2023-05-02 ENCOUNTER — Ambulatory Visit: Payer: No Typology Code available for payment source | Admitting: Physician Assistant

## 2023-05-02 VITALS — BP 149/98 | HR 84 | Ht 66.0 in | Wt 167.0 lb

## 2023-05-02 DIAGNOSIS — F1910 Other psychoactive substance abuse, uncomplicated: Secondary | ICD-10-CM

## 2023-05-02 DIAGNOSIS — I10 Essential (primary) hypertension: Secondary | ICD-10-CM | POA: Diagnosis not present

## 2023-05-02 DIAGNOSIS — M79671 Pain in right foot: Secondary | ICD-10-CM

## 2023-05-02 DIAGNOSIS — F1510 Other stimulant abuse, uncomplicated: Secondary | ICD-10-CM

## 2023-05-02 DIAGNOSIS — K219 Gastro-esophageal reflux disease without esophagitis: Secondary | ICD-10-CM | POA: Diagnosis not present

## 2023-05-02 DIAGNOSIS — E782 Mixed hyperlipidemia: Secondary | ICD-10-CM | POA: Diagnosis not present

## 2023-05-02 DIAGNOSIS — B20 Human immunodeficiency virus [HIV] disease: Secondary | ICD-10-CM

## 2023-05-02 MED ORDER — OMEPRAZOLE 20 MG PO CPDR: 20.0000 mg | DELAYED_RELEASE_CAPSULE | Freq: Two times a day (BID) | ORAL | 1 refills | Status: DC

## 2023-05-02 NOTE — Patient Instructions (Addendum)
Your blood pressure is elevated today.  I encourage you to check your blood pressure on a daily basis, keep a written log and have available for all office visits.  Make sure you keep the area on your right heel clean and dry, apply triple antibiotic cream twice daily as needed until resolved.  I encourage you to follow-up with infectious disease as soon as you are able.  You will report to [facility] in the morning to have fasting labs completed.  We will call you with the results as soon as they are available.  You will follow-up with the mobile unit in 2 weeks.  Roney Jaffe, PA-C Physician Assistant [facility] Medicine [email or web address]   Blisters, Adult  A blister is a raised bubble of skin filled with liquid. It can form on skin that rubs or presses again and again on another surface (friction blister).  Blisters can happen on any part of the body. In most cases, they form on the hands or the feet. Long-term pressure or friction on that same spot of skin can cause the skin to harden. This hardened skin is called a callus. Blisters can also be caused by other types of injuries and infection. What are the causes? Blisters can be caused by friction. They can also be caused by: An injury, such as a burn, frostbite, or pinched skin. An allergic reaction, such as to poison ivy. An infection or disease, such as a cold sore or skin infection. Chemicals, such as soaps or cleaning products. Pressure on the skin over a bony spot or under a medical device like a cast. Friction blisters often happen where there is a lot of heat and moisture. They may result from: Sports. Activities that are done over and over. Using tools and doing other activities without wearing gloves. Shoes that are too tight or too loose. What are the signs or symptoms? Blisters are bubbles of skin. They may hurt or feel itchy. They can be  filled with clear liquid, blood, or pus. The type of liquid in the blister depends on what caused the blister. Before a blister forms, your skin may: Turn red. Feel warm. Itch. Be painful to the touch. How is this diagnosed? A blister is diagnosed with a physical exam. How is this treated? You may need to protect the spot where the blister has formed until your skin has healed. This may mean: Using a bandage (dressing) to cover your blister. Putting extra padding around and over your blister so that it does not rub on anything. Applying antibiotic ointment. Most blisters will heal on their own. They should break open, dry up, and go away within 1-2 weeks. Blisters that are very painful may be drained by your health care provider. Follow these instructions at home: Medicines Take or apply over-the-counter and prescription medicines only as told by your provider. If you were prescribed antibiotics, take or apply them as told by your provider. Do not stop using the antibiotic even if you start to feel better. Skin care Do not pop your blister. This can cause infection. Keep your blister clean and dry. This helps to prevent infection. Before you swim or use a hot tub, cover your blister with a waterproof dressing. Protect the spot where the blister has formed as told by your provider. Follow instructions from your provider about how to take care of your blister. Make sure you: Wash your hands with soap and water for at least 20  seconds before and after you change your dressing. If soap and water are not available, use hand sanitizer. Change your dressing as told by your provider. Infection signs Check your blister every day for signs of infection. Check for: More redness, swelling, or pain. More fluid or blood. Warmth. Pus or a bad smell. General instructions If you have a blister on your foot or toe, wear other shoes until it heals. Avoid the activity that caused the blister until it  heals. How is this prevented? You can take certain steps to help prevent friction blisters. Make sure you: Wear comfortable shoes that fit well. Always wear socks with shoes. Wear extra socks or use tape, dressings, or pads over spots that are prone to blisters. You may also put petroleum jelly under the dressings. Wear protective gear, such as gloves, when you take part in sports or activities that can cause blisters. Wear loose-fitting clothes that repel moisture when you take part in sports or other activities. Use powders as needed to keep your feet dry. Where to find more information American [employer] Association: [employer] Contact a health care provider if: You have signs of an infection. You have a fever or chills. Your blister gets better and then gets worse. This information is not intended to replace advice given to you by your health care provider. Make sure you discuss any questions you have with your health care provider. Document Revised: 03/08/2022 Document Reviewed: 03/08/2022 Elsevier Patient Education  2024 [employer].

## 2023-05-02 NOTE — Progress Notes (Unsigned)
Established Patient Office Visit  Subjective   Patient ID: Jimmy Fisher, male    DOB: 19-May-1981  Age: 42 y.o. MRN: [id number]  Chief Complaint  Patient presents with   Medication Refill   Foot Injury    Blister on Right heel.     States that he is currently being treated for substance abuse at [facility] recovery center, states that he arrived on Friday.  States that he just started taking his blood pressure medication on Friday, has not been checking his blood pressure on a daily basis.  States that he is supposed to be taking omeprazole 20 mg, states he did not have the medication to bring with him.  States that he was recently incarcerated and while incarcerated had labs completed and was told that his triglycerides were really high.  States that he was not started on any medication.    States that he has a sore on his right heel.  States that he had been doing a lot of walking and developed a blister in the area approximately 1 week ago.  States the blister did pop, states that the area has been red and sore.  Has not tried anything for relief.  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   Neuromuscular disorder (HCC)    fibromyalgia   Pulmonary embolism (HCC)    2012   SLAP tear of shoulder    left   Urticarial rash 12/05/2022   Social History   Socioeconomic History   Marital status: Married    Spouse name: Not on file   Number of children: 2   Years of education: Not on file   Highest education level: Not on file  Occupational History   Not on file  Tobacco Use   Smoking status: Every Day    Types:  E-cigarettes    Passive exposure: Past   Smokeless tobacco: Former    Types: Snuff   Tobacco comments:    Vipes  Vaping Use   Vaping status: Every Day   Substances: Nicotine  Substance and Sexual Activity   Alcohol use: Not Currently    Comment: quit 06/2020   Drug use: Not on file    Comment: feb 2022   Sexual activity: Yes    Comment: declined condoms  Other Topics Concern   Not on file  Social History Narrative   Right Handed    Has two sons    Lives in a one story home    Social Determinants of Health   Financial Resource Strain: Not on file  Food Insecurity: Not on file  Transportation Needs: Not on file  Physical Activity: Not on file  Stress: Not on file  Social Connections: Unknown (10/14/2021)   Received from [facility], [facility]   Social Network    Social Network: Not on file  Intimate Partner Violence: Unknown (09/16/2021)   Received from [facility], [facility]   HITS    Physically Hurt: Not on file    Insult or Talk Down To: Not on file    Threaten Physical  Harm: Not on file    Scream or Curse: Not on file   Family History  Problem Relation Age of Onset   Fibromyalgia Mother    Cancer Father    Allergies  Allergen Reactions   Doxycycline Rash    ? Sun exposed rash from doxy vs adverse drug rxn vs completely not related    Review of Systems  Constitutional: Negative.   HENT: Negative.    Eyes: Negative.   Respiratory:  Negative for shortness of breath.   Cardiovascular:  Negative for chest pain.  Gastrointestinal: Negative.   Genitourinary: Negative.   Musculoskeletal: Negative.   Skin: Negative.   Neurological: Negative.   Endo/Heme/Allergies: Negative.   Psychiatric/Behavioral: Negative.        Objective:     BP (!) 149/98 (BP Location: Left Arm, Patient Position: Sitting, Cuff Size: Large)   Pulse 84   Ht 5\' 6"  (1.676 m)   Wt 167 lb (75.8 kg)   SpO2 96%   BMI 26.95 kg/m  BP Readings from Last 3 Encounters:   05/02/23 (!) 149/98  12/27/22 123/79  12/20/22 125/83   Wt Readings from Last 3 Encounters:  05/02/23 167 lb (75.8 kg)  12/27/22 161 lb (73 kg)  12/20/22 177 lb (80.3 kg)    Physical Exam Vitals and nursing note reviewed.  Constitutional:      Appearance: Normal appearance.  HENT:     Head: Normocephalic and atraumatic.     Right Ear: External ear normal.     Left Ear: External ear normal.     Nose: Nose normal.     Mouth/Throat:     Mouth: Mucous membranes are moist.     Pharynx: Oropharynx is clear.  Eyes:     Extraocular Movements: Extraocular movements intact.     Conjunctiva/sclera: Conjunctivae normal.     Pupils: Pupils are equal, round, and reactive to light.  Cardiovascular:     Rate and Rhythm: Normal rate and regular rhythm.     Pulses: Normal pulses.     Heart sounds: Normal heart sounds.  Pulmonary:     Effort: Pulmonary effort is normal.     Breath sounds: Normal breath sounds.  Musculoskeletal:        General: Normal range of motion.     Cervical back: Normal range of motion and neck supple.  Feet:     Right foot:     Skin integrity: Dry skin present. No blister or skin breakdown.     Comments: Erythema noted right heel, non-purulent, tender to palpation.  Warm to touch.  Healing appropriately  Skin:    General: Skin is warm and dry.  Neurological:     General: No focal deficit present.     Mental Status: He is alert and oriented to person, place, and time.  Psychiatric:        Mood and Affect: Mood normal.        Behavior: Behavior normal.        Thought Content: Thought content normal.        Judgment: Judgment normal.        Assessment & Plan:   Problem List Items Addressed This Visit       Cardiovascular and Mediastinum   Hypertension - Primary     Digestive   GERD (gastroesophageal reflux disease)   Relevant Medications   omeprazole (PRILOSEC) 20 MG capsule     Other   HIV disease (HCC) (Chronic)   Hyperlipemia   Relevant  Orders   Lipid panel   Methamphetamine abuse (HCC)   Polysubstance abuse (HCC)   Other Visit Diagnoses     Pain of right heel       Elevated blood pressure reading in office with diagnosis of hypertension          1. Primary hypertension Continue current regimen, no refills needed today.  Patient encouraged to check blood pressure on a daily basis, keep a written log and have available for all office visits.  2. Mixed hyperlipidemia Patient [facility] -Lipid panel; Future  3. Pain of right heel Patient education given on supportive care  4. Gastroesophageal reflux disease, unspecified whether esophagitis present Continue current regimen - omeprazole (PRILOSEC) 20 MG capsule; Take 1 capsule (20 mg total) by mouth 2 (two) times daily.  Dispense: 60 capsule; Refill: 1  5. HIV disease (HCC) Followed by infectious disease  6. Elevated blood pressure reading in office with diagnosis of hypertension   7. Methamphetamine abuse (HCC) Currently in substance abuse treatment program  8. Polysubstance abuse (HCC)    I have reviewed the patient's medical history (PMH, PSH, Social History, Family History, Medications, and allergies) , and have been updated if relevant. I spent 30 minutes reviewing chart and  face to face time with patient.    Return in about 2 weeks (around 05/16/2023) for With MMU.    Kasandra Knudsen Mayers, PA-C

## 2023-05-03 ENCOUNTER — Other Ambulatory Visit: Payer: MEDICAID

## 2023-05-04 ENCOUNTER — Other Ambulatory Visit: Payer: Self-pay

## 2023-05-04 ENCOUNTER — Ambulatory Visit: Payer: MEDICAID | Attending: Physician Assistant

## 2023-05-04 DIAGNOSIS — E782 Mixed hyperlipidemia: Secondary | ICD-10-CM

## 2023-05-04 NOTE — Telephone Encounter (Signed)
[facility] Mentions that a prescription for Omeprazole 20 mg was received from the pharmacy, ordered on 11.18.2024. This medication wasn't added on discharge papers from our last visit. Would you mind typing up a document with medication dosage, and admin instructions to allow [facility] to administer. I can fax over this evening.

## 2023-05-05 LAB — LIPID PANEL
Chol/HDL Ratio: 4.8 ratio (ref 0.0–5.0)
Cholesterol, Total: 187 mg/dL (ref 100–199)
HDL: 39 mg/dL — ABNORMAL LOW (ref >39)
LDL Chol Calc (NIH): 110 mg/dL — ABNORMAL HIGH (ref 0–99)
Triglycerides: 220 mg/dL — ABNORMAL HIGH (ref 0–149)
VLDL Cholesterol Cal: 38 mg/dL (ref 5–40)

## 2023-05-05 MED ORDER — ATORVASTATIN CALCIUM 10 MG PO TABS: 10.0000 mg | ORAL_TABLET | Freq: Every day | ORAL | 1 refills | Status: DC

## 2023-05-09 ENCOUNTER — Encounter: Payer: Self-pay | Admitting: Infectious Disease

## 2023-05-09 ENCOUNTER — Ambulatory Visit: Payer: MEDICAID | Admitting: Infectious Disease

## 2023-05-09 DIAGNOSIS — F901 Attention-deficit hyperactivity disorder, predominantly hyperactive type: Secondary | ICD-10-CM

## 2023-05-09 DIAGNOSIS — Z7185 Encounter for immunization safety counseling: Secondary | ICD-10-CM | POA: Insufficient documentation

## 2023-05-09 DIAGNOSIS — B20 Human immunodeficiency virus [HIV] disease: Secondary | ICD-10-CM

## 2023-05-09 DIAGNOSIS — E782 Mixed hyperlipidemia: Secondary | ICD-10-CM

## 2023-05-09 DIAGNOSIS — I10 Essential (primary) hypertension: Secondary | ICD-10-CM

## 2023-05-09 HISTORY — DX: Encounter for immunization safety counseling: Z71.85
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
## 2023-05-10 ENCOUNTER — Other Ambulatory Visit: Payer: Self-pay | Admitting: Infectious Disease

## 2023-05-10 ENCOUNTER — Other Ambulatory Visit (HOSPITAL_COMMUNITY): Payer: Self-pay

## 2023-05-10 ENCOUNTER — Other Ambulatory Visit: Payer: Self-pay

## 2023-05-10 DIAGNOSIS — B20 Human immunodeficiency virus [HIV] disease: Secondary | ICD-10-CM

## 2023-05-10 MED ORDER — BIKTARVY 50-200-25 MG PO TABS
1.0000 | ORAL_TABLET | Freq: Every day | ORAL | 0 refills | Status: DC
  Filled 2023-05-10: qty 30, 30d supply, fill #0

## 2023-05-10 NOTE — Progress Notes (Addendum)
Specialty Pharmacy Refill Coordination Note  Jimmy Fisher is a 42 y.o. male contacted today regarding refills of specialty medication(s) Bictegravir-Emtricitab-Tenofov   Patient requested Jimmy Fisher at [facility] [place] date: 05/20/23  Medication will be filled on 05/20/23.

## 2023-05-11 ENCOUNTER — Other Ambulatory Visit (HOSPITAL_COMMUNITY): Payer: Self-pay

## 2023-05-11 ENCOUNTER — Other Ambulatory Visit: Payer: Self-pay

## 2023-05-17 ENCOUNTER — Other Ambulatory Visit (HOSPITAL_COMMUNITY): Payer: Self-pay

## 2023-05-19 ENCOUNTER — Other Ambulatory Visit: Payer: Self-pay

## 2023-05-20 ENCOUNTER — Other Ambulatory Visit (HOSPITAL_COMMUNITY): Payer: Self-pay

## 2023-05-20 ENCOUNTER — Other Ambulatory Visit: Payer: Self-pay

## 2023-06-14 ENCOUNTER — Other Ambulatory Visit: Payer: Self-pay | Admitting: Infectious Disease

## 2023-06-14 ENCOUNTER — Other Ambulatory Visit: Payer: Self-pay

## 2023-06-14 DIAGNOSIS — B20 Human immunodeficiency virus [HIV] disease: Secondary | ICD-10-CM

## 2023-06-14 NOTE — Progress Notes (Signed)
 Specialty Pharmacy Refill Coordination Note  Jimmy Fisher is a 42 y.o. male contacted today regarding refills of specialty medication(s) Bictegravir-Emtricitab-Tenofov (Biktarvy )   Patient requested Delivery   Delivery date: 06/20/23   Verified address: [address]   [name] [address]   Medication will be filled on 06/17/23 pending a refill request.

## 2023-06-15 ENCOUNTER — Encounter: Payer: Self-pay | Admitting: Infectious Disease

## 2023-06-17 ENCOUNTER — Other Ambulatory Visit: Payer: Self-pay

## 2023-06-17 ENCOUNTER — Telehealth: Payer: Self-pay | Admitting: Infectious Diseases

## 2023-06-17 ENCOUNTER — Other Ambulatory Visit (HOSPITAL_COMMUNITY): Payer: Self-pay

## 2023-06-17 DIAGNOSIS — B20 Human immunodeficiency virus [HIV] disease: Secondary | ICD-10-CM

## 2023-06-17 MED ORDER — IBUPROFEN 600 MG PO TABS
600.0000 mg | ORAL_TABLET | Freq: Three times a day (TID) | ORAL | 0 refills | Status: DC | PRN
  Filled 2023-06-17 – 2023-06-30 (×2): qty 21, 7d supply, fill #0

## 2023-06-17 MED ORDER — BIKTARVY 50-200-25 MG PO TABS
1.0000 | ORAL_TABLET | Freq: Every day | ORAL | 0 refills | Status: DC
  Filled 2023-06-17: qty 30, 30d supply, fill #0
  Filled 2023-06-17 (×2): qty 90, 90d supply, fill #0
  Filled 2023-07-22: qty 30, 30d supply, fill #1
  Filled 2023-08-11: qty 30, 30d supply, fill #2

## 2023-06-17 NOTE — Telephone Encounter (Signed)
 Copied from CRM [id number]. Topic: Clinical - Medication Refill >> Jun 17, 2023 11:42 AM Tobias L wrote: Reason for CRM: Pt seen at mobile clinic, pt requesting refills for omeprazole  (PRILOSEC) 20 MG capsule and  atorvastatin  (LIPITOR) 10 MG tablet that were prescribed by Kirk Sage, PA   Pt requesting refills to go to [facility] [address] [place] [address] Phone: [phone] Fax: [phone].   Pt states he needs these as soon as possible as he is going to inpatient rehabilitation facility tomorrow and needs his medications.

## 2023-06-17 NOTE — Progress Notes (Signed)
 06/17/23: Biktarvy  Patient came to [facility] and requested to pick up his medication instead of having it delivered. [facility] has inventory in stock, so will use their inventory.

## 2023-06-20 ENCOUNTER — Other Ambulatory Visit: Payer: Self-pay | Admitting: Physician Assistant

## 2023-06-20 DIAGNOSIS — K219 Gastro-esophageal reflux disease without esophagitis: Secondary | ICD-10-CM

## 2023-06-20 DIAGNOSIS — E782 Mixed hyperlipidemia: Secondary | ICD-10-CM

## 2023-06-20 MED ORDER — OMEPRAZOLE 20 MG PO CPDR
20.0000 mg | DELAYED_RELEASE_CAPSULE | Freq: Two times a day (BID) | ORAL | 1 refills | Status: DC
  Filled 2023-06-20: qty 60, 30d supply, fill #0

## 2023-06-20 MED ORDER — ATORVASTATIN CALCIUM 10 MG PO TABS
10.0000 mg | ORAL_TABLET | Freq: Every day | ORAL | 1 refills | Status: DC
  Filled 2023-06-20: qty 30, 30d supply, fill #0
  Filled 2023-08-12: qty 30, 30d supply, fill #1

## 2023-06-21 ENCOUNTER — Other Ambulatory Visit: Payer: Self-pay

## 2023-06-22 ENCOUNTER — Other Ambulatory Visit: Payer: Self-pay

## 2023-06-22 ENCOUNTER — Ambulatory Visit: Payer: MEDICAID | Admitting: Internal Medicine

## 2023-06-23 ENCOUNTER — Other Ambulatory Visit (HOSPITAL_COMMUNITY): Payer: Self-pay

## 2023-06-24 ENCOUNTER — Other Ambulatory Visit (HOSPITAL_COMMUNITY): Payer: Self-pay

## 2023-06-27 ENCOUNTER — Ambulatory Visit: Payer: MEDICAID | Admitting: Infectious Disease

## 2023-06-29 ENCOUNTER — Other Ambulatory Visit (HOSPITAL_COMMUNITY): Payer: Self-pay

## 2023-06-30 ENCOUNTER — Other Ambulatory Visit: Payer: Self-pay

## 2023-07-22 ENCOUNTER — Other Ambulatory Visit (HOSPITAL_COMMUNITY): Payer: Self-pay | Admitting: Pharmacy Technician

## 2023-07-22 ENCOUNTER — Other Ambulatory Visit (HOSPITAL_COMMUNITY): Payer: Self-pay

## 2023-07-22 ENCOUNTER — Other Ambulatory Visit: Payer: Self-pay

## 2023-07-22 NOTE — Progress Notes (Signed)
 Specialty Pharmacy Refill Coordination Note  Jimmy Fisher is a 43 y.o. male contacted today regarding refills of specialty medication(s) Bictegravir-Emtricitab-Tenofov (Biktarvy )   Patient requested Delivery   Delivery date: 07/26/23   Verified address: Patient address [address]  [place]   Medication will be filled on 07/25/23.

## 2023-08-11 ENCOUNTER — Other Ambulatory Visit: Payer: Self-pay

## 2023-08-11 NOTE — Progress Notes (Signed)
 Specialty Pharmacy Ongoing Clinical Assessment Note  Jimmy Fisher is a 43 y.o. male who is being followed by the specialty pharmacy service for RxSp HIV   Patient's specialty medication(s) reviewed today: Bictegravir-Emtricitab-Tenofov (Biktarvy)  Spoke with patient's mom who reports patient has an upcoming appt for labs.   Missed doses in the last 4 weeks: 0   Patient/Caregiver did not have any additional questions or concerns.   Therapeutic benefit summary: Unable to assess   Adverse events/side effects summary: No adverse events/side effects   Patient's therapy is appropriate to: Continue    Goals Addressed             This Visit's Progress    Achieve Undetectable HIV Viral Load < 20       Patient is not on track and no change. Patient will work on increased adherence         Follow up:  6 months  Bobette Mo Specialty Pharmacist

## 2023-08-11 NOTE — Progress Notes (Signed)
 Specialty Pharmacy Refill Coordination Note  Jimmy Fisher is a 43 y.o. male contacted today regarding refills of specialty medication(s) Bictegravir-Emtricitab-Tenofov Susanne Borders)   Patient requested Delivery   Delivery date: 08/16/23   Verified address: [address]   Medication will be filled on 08/15/23.

## 2023-08-12 ENCOUNTER — Other Ambulatory Visit (HOSPITAL_COMMUNITY): Payer: Self-pay

## 2023-08-22 ENCOUNTER — Ambulatory Visit: Payer: No Typology Code available for payment source | Admitting: Physician Assistant

## 2023-09-05 ENCOUNTER — Ambulatory Visit: Admitting: Physician Assistant

## 2023-09-05 ENCOUNTER — Other Ambulatory Visit: Payer: Self-pay

## 2023-09-06 ENCOUNTER — Other Ambulatory Visit (HOSPITAL_COMMUNITY): Payer: Self-pay

## 2023-09-07 ENCOUNTER — Other Ambulatory Visit: Payer: Self-pay

## 2023-09-07 ENCOUNTER — Encounter: Payer: Self-pay | Admitting: Physician Assistant

## 2023-09-07 ENCOUNTER — Other Ambulatory Visit: Payer: Self-pay | Admitting: Infectious Disease

## 2023-09-07 ENCOUNTER — Ambulatory Visit: Admitting: Physician Assistant

## 2023-09-07 ENCOUNTER — Encounter: Payer: Self-pay | Admitting: Infectious Diseases

## 2023-09-07 ENCOUNTER — Ambulatory Visit: Admitting: Infectious Diseases

## 2023-09-07 VITALS — BP 125/73 | HR 85 | Temp 98.2°F | Ht 66.0 in | Wt 193.4 lb

## 2023-09-07 VITALS — BP 122/84 | HR 80 | Temp 98.0°F | Resp 18

## 2023-09-07 DIAGNOSIS — F1124 Opioid dependence with opioid-induced mood disorder: Secondary | ICD-10-CM | POA: Diagnosis not present

## 2023-09-07 DIAGNOSIS — F1911 Other psychoactive substance abuse, in remission: Secondary | ICD-10-CM

## 2023-09-07 DIAGNOSIS — B20 Human immunodeficiency virus [HIV] disease: Secondary | ICD-10-CM | POA: Diagnosis not present

## 2023-09-07 DIAGNOSIS — Z23 Encounter for immunization: Secondary | ICD-10-CM | POA: Diagnosis not present

## 2023-09-07 DIAGNOSIS — R221 Localized swelling, mass and lump, neck: Secondary | ICD-10-CM | POA: Diagnosis not present

## 2023-09-07 DIAGNOSIS — E782 Mixed hyperlipidemia: Secondary | ICD-10-CM | POA: Diagnosis not present

## 2023-09-07 MED ORDER — BIKTARVY 50-200-25 MG PO TABS
1.0000 | ORAL_TABLET | Freq: Every day | ORAL | 1 refills | Status: DC
  Filled 2023-09-07: qty 90, 90d supply, fill #0

## 2023-09-07 NOTE — Assessment & Plan Note (Signed)
 He appears to be doing well Will rely on labs from [facility] research We discussed switching his meds to try to eliminate diarrhea (dovato?) but he feels the diarrhea is getting better.  Will see him back in 6 months (he may move to [place]) Encouraged about his progress in SUD programs.

## 2023-09-07 NOTE — Progress Notes (Signed)
 Specialty Pharmacy Refill Coordination Note  Jimmy Fisher is a 43 y.o. male contacted today regarding refills of specialty medication(s) Bictegravir-Emtricitab-Tenofov Susanne Borders)   Patient requested Delivery   Delivery date: 09/16/23   Verified address: [address] [place], [address]   Medication will be filled on 09/15/23, pending refill approval.

## 2023-09-07 NOTE — Telephone Encounter (Signed)
Appt 3/26

## 2023-09-07 NOTE — Patient Instructions (Addendum)
 Immunizations today will protect you against meningococcus and Hepatitis B Biktarvy refilled today, do not take in conjunction with fiber supplement those must be given at least 4 hours after biktarvy Enjoy the mountains and the outdoors, best of luck on your job interview
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
## 2023-09-07 NOTE — Assessment & Plan Note (Signed)
 Not sure what this is.  Will check u/s and ask [facility] research to add TSH (would explain wt gain)

## 2023-09-07 NOTE — Addendum Note (Signed)
 Addended by: Horton Finer on: 09/07/2023 03:17 PM   Modules accepted: Orders

## 2023-09-07 NOTE — Progress Notes (Signed)
 Subjective:    Patient ID: Jimmy Fisher, male    DOB: 07/22/1980, 43 y.o.   MRN: [id number]  Chief Complaint  Patient presents with   Follow-up    HIV care     HPI:  Jimmy Fisher is a 43 y.o. male living with HIV-1.  He has been taking Biktarvy as directed and tolerating well.  Since last OPV 12/27/22 VL was 104. Patient has been to several inpatient rehab facilities, completed [facility] and [facility] [place]. Pirior to completing rehab he had a few relapses and spent 2 months in jail.  He has not used IV drugs in > 3 months. He is moving to [place], [place] to join a transitional living program.  He has a job interview today and will see his PCP Dr. Ninetta Lights today as well. He feels optimistic about his future.  His relationship with his ex wife and 1 of his son's has been salvaged.  He is providing some needed space between him and his other son. He has not been sexually active in 1 year.  When using illicit drug he did not share needles. Mood is stable. He denies any current symptoms.       Allergies  Allergen Reactions   Doxycycline Rash    ? Sun exposed rash from doxy vs adverse drug rxn vs completely not related      Outpatient Medications Prior to Visit  Medication Sig Dispense Refill   atorvastatin (LIPITOR) 10 MG tablet Take 1 tablet (10 mg total) by mouth daily. 30 tablet 1   buPROPion (WELLBUTRIN SR) 150 MG 12 hr tablet Take 150 mg by mouth daily.     Calcium Polycarbophil (FIBER) 625 MG TABS Take 1 tablet (625 mg total) by mouth daily. 30 tablet 1   FLUoxetine (PROZAC) 40 MG capsule Take 1 capsule (40 mg total) by mouth daily. 90 capsule 3   lidocaine (LIDODERM) 5 % Place 1 patch onto the skin daily. Remove & Discard patch within 12 hours or as directed by MD 30 patch 0   OLANZapine (ZYPREXA) 5 MG tablet Take 1 tablet (5 mg total) by mouth at bedtime. 90 tablet 0   olmesartan (BENICAR) 20 MG tablet Take 1 tablet (20 mg total) by mouth daily. 30 tablet 1    olmesartan (BENICAR) 20 MG tablet Take 1 tablet (20 mg total) by mouth daily. 90 tablet 3   omeprazole (PRILOSEC) 20 MG capsule Take 1 capsule (20 mg total) by mouth 2 (two) times daily. 60 capsule 1   bictegravir-emtricitabine-tenofovir AF (BIKTARVY) 50-200-25 MG TABS tablet Take 1 tablet by mouth daily. 90 tablet 0   ibuprofen (ADVIL) 600 MG tablet Take 1 tablet (600 mg total) by mouth every 8 (eight) hours as needed for pain for up to 7 days. 21 tablet 0   meloxicam (MOBIC) 15 MG tablet Take 1 tablet (15 mg total) by mouth daily with a meal 30 tablet 0   No facility-administered medications prior to visit.     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   Neuromuscular disorder (HCC)    fibromyalgia   Pulmonary embolism (HCC)  2012   SLAP tear of shoulder    left   Urticarial rash 12/05/2022   Vaccine counseling 05/09/2023     Past Surgical History:  Procedure Laterality Date   COLONOSCOPY     I & D EXTREMITY Left 07/16/2020   Procedure: IRRIGATION AND DEBRIDEMENT FOREARM;  Surgeon: Bradly Bienenstock, MD;  Location[facility] OR;  Service: Orthopedics;  Laterality: Left;   SHOULDER ARTHROSCOPY WITH BICEPS TENDON REPAIR Left 10/22/2020   Procedure: LEFT SHOULDER ARTHROSCOPY, BICEPS TENODESIS;  Surgeon: Tarry Kos, MD;  Location: [facility];  Service: Orthopedics;  Laterality: Left;   SUBACROMIAL DECOMPRESSION Left 10/22/2020   Procedure: SUBACROMIAL DECOMPRESSION;  Surgeon: Tarry Kos, MD;  Location: [facility];  Service: Orthopedics;  Laterality: Left;   UPPER GI ENDOSCOPY         Review of Systems  Constitutional:  Negative for  chills, fatigue and fever.  HENT:  Negative for congestion and sore throat.   Eyes:  Negative for visual disturbance.  Respiratory:  Negative for cough and wheezing.   Cardiovascular:  Negative for chest pain and leg swelling.  Gastrointestinal:  Negative for abdominal pain, diarrhea and nausea.  Musculoskeletal:  Negative for gait problem and joint swelling.  Skin:  Negative for rash and wound.  Neurological:  Negative for dizziness, weakness, light-headedness and headaches.  Hematological:  Negative for adenopathy.  Psychiatric/Behavioral:  Negative for behavioral problems and suicidal ideas.       Objective:    BP 122/84 (BP Location: Left Arm, Patient Position: Sitting, Cuff Size: Normal)   Pulse 80   Temp 98 F (36.7 C)   Resp 18  Nursing note and vital signs reviewed.  Physical Exam Vitals reviewed.  Constitutional:      General: He is not in acute distress.    Appearance: Normal appearance. He is normal weight. He is not ill-appearing, toxic-appearing or diaphoretic.  HENT:     Head: Normocephalic and atraumatic.  Eyes:     Extraocular Movements: Extraocular movements intact.     Conjunctiva/sclera: Conjunctivae normal.     Pupils: Pupils are equal, round, and reactive to light.  Cardiovascular:     Rate and Rhythm: Normal rate and regular rhythm.  Pulmonary:     Effort: Pulmonary effort is normal.     Breath sounds: Normal breath sounds.  Musculoskeletal:        General: Normal range of motion.     Cervical back: Normal range of motion.  Skin:    General: Skin is warm and dry.     Findings: No lesion or rash.  Neurological:     General: No focal deficit present.     Mental Status: He is alert and oriented to person, place, and time. Mental status is at baseline.  Psychiatric:        Mood and Affect: Mood normal.        Behavior: Behavior normal.        Thought Content: Thought content normal.        Judgment: Judgment normal.         09/07/2023    2:41  PM 09/07/2023    1:51 PM 12/06/2022    8:55 AM 04/28/2022    2:23 PM 04/14/2022    2:08 PM  Depression screen PHQ 2/9  Decreased Interest 0 0 0 3 1  Down, Depressed, Hopeless 0 0 1 3 1   PHQ - 2 Score 0 0 1 6 2   Altered sleeping  2  3 1   Tired, decreased energy  3  2 2   Change in appetite  0  3 3  Feeling bad or failure about yourself   1  3 3   Trouble concentrating  2  3 3   Moving slowly or fidgety/restless  0  3 3  Suicidal thoughts  0  3 0  PHQ-9 Score  8  26 17   Difficult doing work/chores  Somewhat difficult  Extremely dIfficult Very difficult       Assessment & Plan:  HIV-1: Refilled Biktarvy, labs today, f/u 4 months Health Maintenance-HBV vaccine and Menvayo   Patient Active Problem List   Diagnosis Date Noted   Vaccine counseling 05/09/2023   Polysubstance abuse (HCC) 04/25/2023   MDD (major depressive disorder), recurrent severe, 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/2023   Needs assistance with community resources 04/25/2023   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   Urticarial rash 12/05/2022   Phlebitis 04/28/2022   Onychomycosis 04/14/2022   Erectile dysfunction 05/26/2021   Frequency of micturition 05/26/2021   ADHD 02/20/2021   Polyarthralgia 02/19/2021   Impingement syndrome of left shoulder    Numbness and tingling of leg 08/19/2020   Hemorrhoids 08/19/2020   Hypertension 04/03/2020   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   Generalized anxiety disorder 01/30/2020   Flank pain 01/10/2020   Healthcare maintenance 01/10/2020   Fibromyalgia 01/10/2020   Hyperlipemia 01/10/2020   GERD (gastroesophageal reflux disease) 12/18/2019   HIV disease (HCC)    Opioid dependence with opioid-induced mood disorder (HCC)    Major depressive disorder, 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/2019     Problem List Items Addressed This Visit     HIV disease (HCC) - Primary (Chronic)   Relevant Medications   bictegravir-emtricitabine-tenofovir AF (BIKTARVY) 50-200-25  MG TABS tablet   Other Relevant Orders   COMPLETE METABOLIC PANEL WITH GFR   HIV-1 RNA quant-no reflex-bld   Lipid panel   CBC with Differential/Platelet   T-helper cell (CD4)- ([facility] clinic only)   RPR   Hepatitis C antibody   Hepatitis B surface antigen   MENINGOCOCCAL MCV4O (Completed)   Heplisav-B (HepB-CPG) Vaccine (Completed)   Other Visit Diagnoses       Substance abuse in remission (HCC)         Need for meningococcal vaccination       Relevant Orders   MENINGOCOCCAL MCV4O (Completed)     Need for hepatitis B vaccination       Relevant Orders   Heplisav-B (HepB-CPG) Vaccine (Completed)        I have discontinued Molli Hazard A. Corso's meloxicam and ibuprofen. I am also having him maintain his olmesartan, lidocaine, Fiber, buPROPion, FLUoxetine, OLANZapine, olmesartan, atorvastatin, omeprazole, and Biktarvy.   Meds ordered this encounter  Medications   bictegravir-emtricitabine-tenofovir AF (BIKTARVY) 50-200-25 MG TABS tablet    Sig: Take 1 tablet by mouth daily.    Dispense:  90 tablet    Refill:  1    Patient checking into inpatient rehab and needs 90 day supply    Supervising Provider:   VAN DAM, CORNELIUS N [[id number]]    Prescription Type::   Renewal     Follow-up: Return in about 4 months (around 01/07/2024).

## 2023-09-07 NOTE — Assessment & Plan Note (Signed)
 Doing well, has engaged with multiple support systems.

## 2023-09-07 NOTE — Assessment & Plan Note (Signed)
 He is on lipitor and having no issues.  Will continue, f/u lipids and lfts.

## 2023-09-08 ENCOUNTER — Encounter: Payer: Self-pay | Admitting: Infectious Diseases

## 2023-09-08 ENCOUNTER — Ambulatory Visit
Admission: RE | Admit: 2023-09-08 | Discharge: 2023-09-08 | Disposition: A | Discharge status: Home / Self Care | Source: Ambulatory Visit | Attending: Infectious Diseases | Admitting: Infectious Diseases

## 2023-09-08 DIAGNOSIS — R221 Localized swelling, mass and lump, neck: Secondary | ICD-10-CM

## 2023-09-08 LAB — T-HELPER CELL (CD4) - (RCID CLINIC ONLY)
CD4 % Helper T Cell: 41 % (ref 33–65)
CD4 T Cell Abs: 1049 /uL (ref 400–1790)

## 2023-09-09 LAB — CBC WITH DIFFERENTIAL/PLATELET
Absolute Lymphocytes: 2573 {cells}/uL (ref 850–3900)
Absolute Monocytes: 442 {cells}/uL (ref 200–950)
Basophils Absolute: 38 {cells}/uL (ref 0–200)
Basophils Relative: 0.6 %
Eosinophils Absolute: 109 {cells}/uL (ref 15–500)
Eosinophils Relative: 1.7 %
HCT: 46.3 % (ref 38.5–50.0)
Hemoglobin: 15.8 g/dL (ref 13.2–17.1)
MCH: 30.7 pg (ref 27.0–33.0)
MCHC: 34.1 g/dL (ref 32.0–36.0)
MCV: 89.9 fL (ref 80.0–100.0)
MPV: 9.1 fL (ref 7.5–12.5)
Monocytes Relative: 6.9 %
Neutro Abs: 3238 {cells}/uL (ref 1500–7800)
Neutrophils Relative %: 50.6 %
Platelets: 268 10*3/uL (ref 140–400)
RBC: 5.15 10*6/uL (ref 4.20–5.80)
RDW: 13.1 % (ref 11.0–15.0)
Total Lymphocyte: 40.2 %
WBC: 6.4 10*3/uL (ref 3.8–10.8)

## 2023-09-09 LAB — LIPID PANEL
Cholesterol: 152 mg/dL (ref <200)
HDL: 36 mg/dL — ABNORMAL LOW (ref >=40)
LDL Cholesterol (Calc): 88 mg/dL
Non-HDL Cholesterol (Calc): 116 mg/dL (ref <130)
Total CHOL/HDL Ratio: 4.2 (calc) (ref <5.0)
Triglycerides: 187 mg/dL — ABNORMAL HIGH (ref <150)

## 2023-09-09 LAB — COMPLETE METABOLIC PANEL WITHOUT GFR
AG Ratio: 1.8 (calc) (ref 1.0–2.5)
ALT: 29 U/L (ref 9–46)
AST: 25 U/L (ref 10–40)
Albumin: 4.7 g/dL (ref 3.6–5.1)
Alkaline phosphatase (APISO): 86 U/L (ref 36–130)
BUN: 11 mg/dL (ref 7–25)
CO2: 26 mmol/L (ref 20–32)
Calcium: 9.6 mg/dL (ref 8.6–10.3)
Chloride: 105 mmol/L (ref 98–110)
Creat: 1.07 mg/dL (ref 0.60–1.29)
Globulin: 2.6 g/dL (ref 1.9–3.7)
Glucose, Bld: 157 mg/dL — ABNORMAL HIGH (ref 65–99)
Potassium: 4 mmol/L (ref 3.5–5.3)
Sodium: 139 mmol/L (ref 135–146)
Total Bilirubin: 1 mg/dL (ref 0.2–1.2)
Total Protein: 7.3 g/dL (ref 6.1–8.1)

## 2023-09-09 LAB — HIV-1 RNA QUANT-NO REFLEX-BLD
HIV 1 RNA Quant: <20 {copies}/mL — AB
HIV-1 RNA Quant, Log: <1.3 {Log_copies}/mL — AB

## 2023-09-09 LAB — THYROID PANEL WITH TSH
Free Thyroxine Index: 2 (ref 1.4–3.8)
T4, Total: 6 ug/dL (ref 4.9–10.5)
TSH: 1.37 m[IU]/L (ref 0.40–4.50)
TSH: 34 m[IU]/L (ref 22–4.50)

## 2023-09-09 LAB — HEPATITIS C ANTIBODY: Hepatitis C Ab: NONREACTIVE

## 2023-09-09 LAB — HEPATITIS B SURFACE ANTIGEN: Hepatitis B Surface Ag: NONREACTIVE

## 2023-09-09 LAB — RPR: RPR Ser Ql: NONREACTIVE

## 2023-09-12 ENCOUNTER — Other Ambulatory Visit (HOSPITAL_COMMUNITY): Payer: Self-pay

## 2023-09-15 ENCOUNTER — Other Ambulatory Visit: Payer: Self-pay

## 2023-09-15 ENCOUNTER — Other Ambulatory Visit (HOSPITAL_COMMUNITY): Payer: Self-pay

## 2023-09-15 NOTE — Telephone Encounter (Signed)
 Called and left VM letting pt know wt loss medications are not on his formulary.  Can appeal if we get more info from pt regarding what he has tried so far.  If pt has more info (what he has tried to lose wt so far) asked him to send my chart message or call clinic.  Thanks

## 2023-09-16 ENCOUNTER — Other Ambulatory Visit (HOSPITAL_COMMUNITY): Payer: Self-pay

## 2023-09-16 ENCOUNTER — Other Ambulatory Visit: Payer: Self-pay | Admitting: Infectious Diseases

## 2023-09-16 ENCOUNTER — Other Ambulatory Visit: Payer: Self-pay

## 2023-09-16 DIAGNOSIS — E782 Mixed hyperlipidemia: Secondary | ICD-10-CM

## 2023-09-19 ENCOUNTER — Other Ambulatory Visit: Payer: Self-pay

## 2023-09-19 ENCOUNTER — Encounter: Payer: Self-pay | Admitting: Infectious Diseases

## 2023-09-19 DIAGNOSIS — K219 Gastro-esophageal reflux disease without esophagitis: Secondary | ICD-10-CM

## 2023-09-19 NOTE — Telephone Encounter (Signed)
 Medication prescribed by provider not with [facility]

## 2023-09-20 ENCOUNTER — Ambulatory Visit: Payer: Self-pay

## 2023-09-20 ENCOUNTER — Ambulatory Visit: Payer: Self-pay | Admitting: *Deleted

## 2023-09-20 NOTE — Telephone Encounter (Signed)
  Chief Complaint: abdominal pain low chest sternal area. Requesting additional medication with omeprazole  Symptoms: dull throbbing pain as had before with stomach ulcer. Worsening pain when pt does not eat. Pain comes and goes Frequency: couple of days  Pertinent Negatives: Patient denies chest pain no difficult breathing no fever no N/V. Disposition: [] ED /[] Urgent Care (no appt availability in office) / [] Appointment(In office/virtual)/ []  [place] [facility]/ [] Home Care/ [] Refused Recommended Disposition /[] [place]/ [x]  Follow-up with PCP Additional Notes:   Patient requesting medication and reports he is 3 hours away for work. No available my chart VV until 09/22/23. Please review my chart message sent today with request and pharmacy location.       Copied from CRM [id number]. Topic: Clinical - Medical Advice >> Sep 20, 2023 10:40 AM Adrianna P wrote: Reason for CRM: possible stomach ulcer pain is 6 out of 10,  dull throbbing pain in sternum area more so when he has not ate. Patient would like to be prescribed medicine to  help >> Sep 20, 2023  3:35 PM Dennison Nancy wrote: Patient returning call he received today from nurse Triage  Reason for Disposition  [1] MODERATE pain (e.g., interferes with normal activities) AND [2] pain comes and goes (cramps) AND [3] present > 24 hours  (Exception: Pain with Vomiting or Diarrhea - see that Guideline.)  Answer Assessment - Initial Assessment Questions 1. LOCATION: "Where does it hurt?"      Right below chest / sternal area 2. RADIATION: "Does the pain shoot anywhere else?" (e.g., chest, back)     No  3. ONSET: "When did the pain begin?" (Minutes, hours or days ago)      Couple day s 4. SUDDEN: "Gradual or sudden onset?"     Na  5. PATTERN "Does the pain come and go, or is it constant?"    - If it comes and goes: "How long does it last?" "Do you have pain now?"     (Note: Comes and goes means the pain is intermittent. It goes  away completely between bouts.)    - If constant: "Is it getting better, staying the same, or getting worse?"      (Note: Constant means the pain never goes away completely; most serious pain is constant and gets worse.)      Comes and goes  6. SEVERITY: "How bad is the pain?"  (e.g., Scale 1-10; mild, moderate, or severe)    - MILD (1-3): Doesn't interfere with normal activities, abdomen soft and not tender to touch.     - MODERATE (4-7): Interferes with normal activities or awakens from sleep, abdomen tender to touch.     - SEVERE (8-10): Excruciating pain, doubled over, unable to do any normal activities.       6/10 7. RECURRENT SYMPTOM: "Have you ever had this type of stomach pain before?" If Yes, ask: "When was the last time?" and "What happened that time?"      Yes taking omeprazole  8. CAUSE: "What do you think is causing the stomach pain?"     Possible stomach ulcer 9. RELIEVING/AGGRAVATING FACTORS: "What makes it better or worse?" (e.g., antacids, bending or twisting motion, bowel movement)     Antacids omperazole  10. OTHER SYMPTOMS: "Do you have any other symptoms?" (e.g., back pain, diarrhea, fever, urination pain, vomiting)       Dull, throbbing pain worsening when he has not eaten.  Protocols used: Abdominal Pain - Male-A-AH

## 2023-09-20 NOTE — Telephone Encounter (Signed)
  2nd attempt, left voicemail with call back # for patient to call for nurse triage.  Copied from CRM [id number]. Topic: Clinical - Medical Advice >> Sep 20, 2023 10:40 AM Adrianna P wrote: Reason for CRM: possible stomach ulcer pain is 6 out of 10,  dull throbbing pain in sternum area more so when he has not ate. Patient would like to be prescribed medicine to  help

## 2023-09-20 NOTE — Telephone Encounter (Signed)
 3rd attempted made. Patient called, left VM to return the call to the office to speak to NT.  Will route to office.   Copied from CRM [id number]. Topic: Clinical - Medical Advice >> Sep 20, 2023 10:40 AM Adrianna P wrote: Reason for CRM: possible stomach ulcer pain is 6 out of 10,  dull throbbing pain in sternum area more so when he has not ate. Patient would like to be prescribed medicine to  help Reason for Disposition . Third attempt to contact caller AND no contact made. Phone number verified.  Protocols used: No Contact or Duplicate Contact Call-A-AH

## 2023-09-20 NOTE — Telephone Encounter (Signed)
 Patient called, left VM to return the call to the office to speak to NT.   Copied from CRM [id number]. Topic: Clinical - Medical Advice >> Sep 20, 2023 10:40 AM Adrianna P wrote: Reason for CRM: possible stomach ulcer pain is 6 out of 10,  dull throbbing pain in sternum area more so when he has not ate. Patient would like to be prescribed medicine to  help

## 2023-09-21 ENCOUNTER — Other Ambulatory Visit: Payer: Self-pay | Admitting: Physician Assistant

## 2023-09-21 ENCOUNTER — Encounter: Payer: Self-pay | Admitting: *Deleted

## 2023-09-21 DIAGNOSIS — E782 Mixed hyperlipidemia: Secondary | ICD-10-CM

## 2023-09-21 MED ORDER — OMEPRAZOLE 20 MG PO CPDR: 20.0000 mg | DELAYED_RELEASE_CAPSULE | Freq: Two times a day (BID) | ORAL | 1 refills | Status: DC

## 2023-09-22 ENCOUNTER — Other Ambulatory Visit: Payer: Self-pay

## 2023-09-22 ENCOUNTER — Other Ambulatory Visit (HOSPITAL_COMMUNITY): Payer: Self-pay

## 2023-09-27 ENCOUNTER — Other Ambulatory Visit: Payer: Self-pay

## 2023-09-30 ENCOUNTER — Other Ambulatory Visit (HOSPITAL_COMMUNITY): Payer: Self-pay

## 2023-09-30 ENCOUNTER — Other Ambulatory Visit: Payer: Self-pay

## 2023-09-30 ENCOUNTER — Other Ambulatory Visit: Payer: Self-pay | Admitting: Physician Assistant

## 2023-09-30 DIAGNOSIS — E782 Mixed hyperlipidemia: Secondary | ICD-10-CM

## 2023-10-04 ENCOUNTER — Other Ambulatory Visit (HOSPITAL_COMMUNITY): Payer: Self-pay

## 2023-10-04 ENCOUNTER — Other Ambulatory Visit: Payer: Self-pay

## 2023-10-04 MED ORDER — ATORVASTATIN CALCIUM 10 MG PO TABS
10.0000 mg | ORAL_TABLET | Freq: Every day | ORAL | 1 refills | Status: DC
  Filled 2023-10-04: qty 30, 30d supply, fill #0

## 2023-10-10 ENCOUNTER — Other Ambulatory Visit: Payer: Self-pay

## 2023-11-03 NOTE — Progress Notes (Signed)
 The 10-year ASCVD risk score (Arnett DK, et al., 2019) is: 5.3%   Values used to calculate the score:     Age: 43 years     Sex: Male     Is Non-Hispanic African American: No     Diabetic: No     Tobacco smoker: Yes     Systolic Blood Pressure: 125 mmHg     Is BP treated: Yes     HDL Cholesterol: 36 mg/dL     Total Cholesterol: 152 mg/dL  Currently prescribed atorvastatin  10 mg.  Briannah Lona, BSN, RN

## 2023-11-17 ENCOUNTER — Encounter (HOSPITAL_COMMUNITY): Payer: Self-pay

## 2023-11-17 ENCOUNTER — Other Ambulatory Visit: Payer: Self-pay

## 2023-11-17 ENCOUNTER — Inpatient Hospital Stay (HOSPITAL_COMMUNITY)
Admission: EM | Admit: 2023-11-17 | Discharge: 2023-11-24 | DRG: 603 | Disposition: A | Discharge status: Rehabilitation Facility | Attending: Internal Medicine | Admitting: Internal Medicine

## 2023-11-17 ENCOUNTER — Ambulatory Visit (HOSPITAL_COMMUNITY)
Admission: EM | Admit: 2023-11-17 | Discharge: 2023-11-17 | Disposition: A | Discharge status: Other Acute Inpatient Hospital

## 2023-11-17 DIAGNOSIS — L03114 Cellulitis of left upper limb: Secondary | ICD-10-CM | POA: Diagnosis present

## 2023-11-17 DIAGNOSIS — E7800 Pure hypercholesterolemia, unspecified: Principal | ICD-10-CM | POA: Diagnosis present

## 2023-11-17 DIAGNOSIS — K219 Gastro-esophageal reflux disease without esophagitis: Secondary | ICD-10-CM | POA: Diagnosis present

## 2023-11-17 DIAGNOSIS — F1010 Alcohol abuse, uncomplicated: Secondary | ICD-10-CM | POA: Diagnosis present

## 2023-11-17 DIAGNOSIS — M797 Fibromyalgia: Secondary | ICD-10-CM | POA: Diagnosis present

## 2023-11-17 DIAGNOSIS — Z881 Allergy status to other antibiotic agents status: Secondary | ICD-10-CM

## 2023-11-17 DIAGNOSIS — E785 Hyperlipidemia, unspecified: Secondary | ICD-10-CM | POA: Diagnosis present

## 2023-11-17 DIAGNOSIS — L02413 Cutaneous abscess of right upper limb: Principal | ICD-10-CM | POA: Diagnosis present

## 2023-11-17 DIAGNOSIS — L03119 Cellulitis of unspecified part of limb: Secondary | ICD-10-CM

## 2023-11-17 DIAGNOSIS — L03311 Cellulitis of abdominal wall: Secondary | ICD-10-CM | POA: Diagnosis present

## 2023-11-17 DIAGNOSIS — M6282 Rhabdomyolysis: Secondary | ICD-10-CM | POA: Diagnosis present

## 2023-11-17 DIAGNOSIS — F1910 Other psychoactive substance abuse, uncomplicated: Secondary | ICD-10-CM | POA: Diagnosis present

## 2023-11-17 DIAGNOSIS — Z5900 Homelessness unspecified: Secondary | ICD-10-CM

## 2023-11-17 DIAGNOSIS — I10 Essential (primary) hypertension: Secondary | ICD-10-CM | POA: Diagnosis present

## 2023-11-17 DIAGNOSIS — F1590 Other stimulant use, unspecified, uncomplicated: Secondary | ICD-10-CM

## 2023-11-17 DIAGNOSIS — Z9151 Personal history of suicidal behavior: Secondary | ICD-10-CM

## 2023-11-17 DIAGNOSIS — R748 Abnormal levels of other serum enzymes: Principal | ICD-10-CM

## 2023-11-17 DIAGNOSIS — Z21 Asymptomatic human immunodeficiency virus [HIV] infection status: Secondary | ICD-10-CM | POA: Insufficient documentation

## 2023-11-17 DIAGNOSIS — Y900 Blood alcohol level of less than 20 mg/100 ml: Secondary | ICD-10-CM | POA: Diagnosis present

## 2023-11-17 DIAGNOSIS — F332 Major depressive disorder, recurrent severe without psychotic features: Secondary | ICD-10-CM | POA: Diagnosis present

## 2023-11-17 DIAGNOSIS — Z79899 Other long term (current) drug therapy: Secondary | ICD-10-CM

## 2023-11-17 DIAGNOSIS — Z86711 Personal history of pulmonary embolism: Secondary | ICD-10-CM

## 2023-11-17 DIAGNOSIS — Z888 Allergy status to other drugs, medicaments and biological substances status: Secondary | ICD-10-CM

## 2023-11-17 DIAGNOSIS — F17290 Nicotine dependence, other tobacco product, uncomplicated: Secondary | ICD-10-CM | POA: Diagnosis present

## 2023-11-17 DIAGNOSIS — Z91148 Patient's other noncompliance with medication regimen for other reason: Secondary | ICD-10-CM

## 2023-11-17 DIAGNOSIS — M7989 Other specified soft tissue disorders: Secondary | ICD-10-CM | POA: Diagnosis present

## 2023-11-17 DIAGNOSIS — F1510 Other stimulant abuse, uncomplicated: Secondary | ICD-10-CM | POA: Diagnosis present

## 2023-11-17 DIAGNOSIS — E876 Hypokalemia: Secondary | ICD-10-CM | POA: Diagnosis not present

## 2023-11-17 DIAGNOSIS — K589 Irritable bowel syndrome without diarrhea: Secondary | ICD-10-CM | POA: Diagnosis present

## 2023-11-17 DIAGNOSIS — E782 Mixed hyperlipidemia: Secondary | ICD-10-CM

## 2023-11-17 DIAGNOSIS — B954 Other streptococcus as the cause of diseases classified elsewhere: Secondary | ICD-10-CM | POA: Diagnosis present

## 2023-11-17 DIAGNOSIS — F339 Major depressive disorder, recurrent, unspecified: Secondary | ICD-10-CM

## 2023-11-17 DIAGNOSIS — B20 Human immunodeficiency virus [HIV] disease: Secondary | ICD-10-CM | POA: Diagnosis present

## 2023-11-17 DIAGNOSIS — F1090 Alcohol use, unspecified, uncomplicated: Secondary | ICD-10-CM | POA: Insufficient documentation

## 2023-11-17 DIAGNOSIS — Z9152 Personal history of nonsuicidal self-harm: Secondary | ICD-10-CM

## 2023-11-17 DIAGNOSIS — G8929 Other chronic pain: Secondary | ICD-10-CM | POA: Diagnosis present

## 2023-11-17 DIAGNOSIS — L309 Dermatitis, unspecified: Secondary | ICD-10-CM | POA: Diagnosis present

## 2023-11-17 DIAGNOSIS — F1410 Cocaine abuse, uncomplicated: Secondary | ICD-10-CM | POA: Diagnosis present

## 2023-11-17 DIAGNOSIS — F411 Generalized anxiety disorder: Secondary | ICD-10-CM | POA: Diagnosis present

## 2023-11-17 DIAGNOSIS — L03113 Cellulitis of right upper limb: Secondary | ICD-10-CM | POA: Diagnosis present

## 2023-11-17 DIAGNOSIS — F1990 Other psychoactive substance use, unspecified, uncomplicated: Secondary | ICD-10-CM

## 2023-11-17 LAB — COMPREHENSIVE METABOLIC PANEL WITH GFR
ALT: 74 U/L — ABNORMAL HIGH (ref 0–44)
AST: 124 U/L — ABNORMAL HIGH (ref 15–41)
Albumin: 3.5 g/dL (ref 3.5–5.0)
Alkaline Phosphatase: 66 U/L (ref 38–126)
Anion gap: 13 (ref 5–15)
BUN: 24 mg/dL — ABNORMAL HIGH (ref 6–20)
CO2: 24 mmol/L (ref 22–32)
Calcium: 8.4 mg/dL — ABNORMAL LOW (ref 8.9–10.3)
Chloride: 97 mmol/L — ABNORMAL LOW (ref 98–111)
Creatinine, Ser: 1.09 mg/dL (ref 0.61–1.24)
GFR, Estimated: >60 mL/min (ref >60)
Glucose, Bld: 143 mg/dL — ABNORMAL HIGH (ref 70–99)
Potassium: 2.9 mmol/L — ABNORMAL LOW (ref 3.5–5.1)
Sodium: 134 mmol/L — ABNORMAL LOW (ref 135–145)
Total Bilirubin: 1.1 mg/dL (ref 0.0–1.2)
Total Protein: 6.9 g/dL (ref 6.5–8.1)

## 2023-11-17 LAB — CBC
HCT: 39.3 % (ref 39.0–52.0)
Hemoglobin: 13.4 g/dL (ref 13.0–17.0)
MCH: 30.5 pg (ref 26.0–34.0)
MCHC: 34.1 g/dL (ref 30.0–36.0)
MCV: 89.3 fL (ref 80.0–100.0)
Platelets: 341 10*3/uL (ref 150–400)
RBC: 4.4 MIL/uL (ref 4.22–5.81)
RDW: 12.6 % (ref 11.5–15.5)
WBC: 13.1 10*3/uL — ABNORMAL HIGH (ref 4.0–10.5)
nRBC: 0 % (ref 0.0–0.2)

## 2023-11-17 LAB — CK: Total CK: 2566 U/L — ABNORMAL HIGH (ref 49–397)

## 2023-11-17 LAB — ETHANOL: Alcohol, Ethyl (B): <15 mg/dL (ref <15)

## 2023-11-17 MED ORDER — POTASSIUM CHLORIDE 10 MEQ/100ML IV SOLN
10.0000 meq | Freq: Once | INTRAVENOUS | Status: AC
  Administered 2023-11-17: 10 meq via INTRAVENOUS
  Filled 2023-11-17: qty 100

## 2023-11-17 MED ORDER — POTASSIUM CHLORIDE CRYS ER 20 MEQ PO TBCR
40.0000 meq | EXTENDED_RELEASE_TABLET | Freq: Once | ORAL | Status: AC
  Administered 2023-11-17: 40 meq via ORAL
  Filled 2023-11-17: qty 2

## 2023-11-17 MED ORDER — SODIUM CHLORIDE 0.9 % IV BOLUS
1000.0000 mL | Freq: Once | INTRAVENOUS | Status: AC
  Administered 2023-11-17: 1000 mL via INTRAVENOUS

## 2023-11-17 MED ORDER — MAGNESIUM OXIDE -MG SUPPLEMENT 400 (240 MG) MG PO TABS
800.0000 mg | ORAL_TABLET | Freq: Once | ORAL | Status: AC
  Administered 2023-11-17: 800 mg via ORAL
  Filled 2023-11-17: qty 2

## 2023-11-17 NOTE — ED Notes (Signed)
 I-li, RN at [facility] aware of patients status and awaiting transfer to their facility via [employer].
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
## 2023-11-17 NOTE — ED Provider Notes (Incomplete)
 [place] EMERGENCY DEPARTMENT AT [facility] Provider Note   CSN: [id number] Arrival date & time: 11/17/23  1705     History HIV, substance abuse Chief Complaint  Patient presents with  . Wound Check    Jimmy Fisher is a 43 y.o. male.  43 year old male with a past medical history of crystal meth use, HIV presents to the ED with a chief complaint of detox.  Reports he has been on a binge of crystal meth for the past 5 days, reports he was crawling to the bushes, has been all over [place] .  Has multiple sources of failed injections, these have been erythematous and streaking.  He also reports he has not had any of his HIV medications in about 10 days.  [facility], however states that he needed to have his wound checked prior to this.  He continues to feel overall itchy, reports discomfort throughout.  Arrived to the ED afebrile, no other complaint reported.  The history is provided by the patient.  Wound Check This is a recurrent problem. Pertinent negatives include no chest pain, no abdominal pain and no shortness of breath.       Home Medications Prior to Admission medications   Medication Sig Start Date End Date Taking? Authorizing Provider  atorvastatin  (LIPITOR) 10 MG tablet Take 1 tablet (10 mg total) by mouth daily. 10/04/23   Sandie Cross, MD  bictegravir-emtricitabine -tenofovir  AF (BIKTARVY ) 50-200-25 MG TABS tablet Take 1 tablet by mouth daily. 09/07/23   Jamesetta Mcbride, PA-C  Calcium  Polycarbophil (FIBER) 625 MG TABS Take 1 tablet (625 mg total) by mouth daily. 12/20/22   Mayers, Cari S, PA-C  FLUoxetine  (PROZAC ) 40 MG capsule Take 1 capsule (40 mg total) by mouth daily. 04/28/23 04/27/24  Sandie Cross, MD  lidocaine  (LIDODERM ) 5 % Place 1 patch onto the skin daily. Remove & Discard patch within 12 hours or as directed by MD 12/20/22   Mayers, Cari S, PA-C  olmesartan  (BENICAR ) 20 MG tablet Take 1 tablet (20 mg  total) by mouth daily. 04/28/23 01/03/24  Sandie Cross, MD  omeprazole  (PRILOSEC) 20 MG capsule Take 1 capsule (20 mg total) by mouth 2 (two) times daily. 09/21/23   Sandie Cross, MD  sucralfate  (CARAFATE ) 1 g tablet Take 1 tablet (1 g total) by mouth 4 (four) times daily. 08/27/21 08/28/21  Elois Hair, MD      Allergies    Doxycycline     Review of Systems   Review of Systems  Constitutional:  Negative for fever.  Respiratory:  Negative for shortness of breath.   Cardiovascular:  Negative for chest pain.  Gastrointestinal:  Positive for nausea. Negative for abdominal pain and vomiting.  Genitourinary:  Negative for flank pain.  Skin:  Positive for wound (Multiple wounds throughout his body.).  All other systems reviewed and are negative.   Physical Exam Updated Vital Signs BP 121/71 (BP Location: Right Arm)   Pulse 77   Temp 98.7 F (37.1 C) (Oral)   Resp 11   SpO2 100%  Physical Exam Vitals and nursing note reviewed.  Constitutional:      Comments: Appears very disheveled, very poor hygiene.  HENT:     Head: Normocephalic and atraumatic.     Mouth/Throat:     Mouth: Mucous membranes are moist.  Eyes:     Comments: Conjunctiva is injected bilaterally.  Pulmonary:     Effort: Pulmonary effort is normal.  Abdominal:     General: Abdomen is flat.     Comments: Multiple wells, streaking in the skin throughout his entire abdomen.  Skin:    General: Skin is warm and dry.  Neurological:     Mental Status: He is alert and oriented to person, place, and time.  Psychiatric:        Speech: Speech is rapid and pressured.        Behavior: Behavior is agitated.        Thought Content: Thought content is paranoid.     ED Results / Procedures / Treatments   Labs (all labs ordered are listed, but only abnormal results are displayed) Labs Reviewed  COMPREHENSIVE METABOLIC PANEL WITH GFR - Abnormal; Notable for the following components:      Result Value    Sodium 134 (*)    Potassium 2.9 (*)    Chloride 97 (*)    Glucose, Bld 143 (*)    BUN 24 (*)    Calcium  8.4 (*)    AST 124 (*)    ALT 74 (*)    All other components within normal limits  CBC - Abnormal; Notable for the following components:   WBC 13.1 (*)    All other components within normal limits  CK - Abnormal; Notable for the following components:   Total CK 2,566 (*)    All other components within normal limits  CULTURE, BLOOD (ROUTINE X 2)  CULTURE, BLOOD (ROUTINE X 2)  ETHANOL  RAPID URINE DRUG SCREEN, HOSP PERFORMED    EKG None  Radiology No results found.  Procedures Procedures    Medications Ordered in ED Medications  potassium chloride 10 mEq in 100 mL IVPB (0 mEq Intravenous Stopped 11/17/23 2341)  magnesium  oxide (MAG-OX) tablet 800 mg (800 mg Oral Given 11/17/23 2210)  potassium chloride SA (KLOR-CON M) CR tablet 40 mEq (40 mEq Oral Given 11/17/23 2210)  sodium chloride  0.9 % bolus 1,000 mL (1,000 mLs Intravenous New Bag/Given 11/17/23 2341)    ED Course/ Medical Decision Making/ A&P Clinical Course as of 11/18/23 0002  Thu Nov 17, 2023  2135 WBC(!): 13.1 [JS]  2136 Potassium(!): 2.9 [JS]  2343 CK Total(!): 2,566 [JS]    Clinical Course User Index [JS] Breella Vanostrand, PA-C                                 Medical Decision Making Amount and/or Complexity of Data Reviewed Labs: ordered. Decision-making details documented in ED Course.  Risk OTC drugs. Prescription drug management.    This patient presents to the ED for concern of HIV, Substance abuse, this involves a number of treatment options, and is a complaint that carries with it a high risk of complications and morbidity.  The differential diagnosis includes cellulitis, sepsis.   Co morbidities: Discussed in HPI   Brief History:  See HPI.   EMR reviewed including pt PMHx, past surgical history and past visits to ER.   See HPI for more details   Lab Tests:  I ordered and  independently interpreted labs.  The pertinent results include:    Labs notable for CBC with a slight leukocytosis of 13.1, hemoglobin stable.  CMP with slight decrease in sodium.  Potassium is low at 2.9, current levels unremarkable.  LFTs are within normal limits.  Imaging Studies:  No imaging studies ordered for this patient  Cardiac Monitoring:  .The patient was maintained  on a cardiac monitor.  I personally viewed and interpreted the cardiac monitored which showed an underlying rhythm of: NSR  .EKG non-ischemic  Medicines ordered:  I ordered medication including potassium, fluids  for symptomatic treatment. Reevaluation of the patient after these medicines showed that the patient improved I have reviewed the patients home medicines and have made adjustments as needed  Reevaluation:  After the interventions noted above I re-evaluated patient and found that they have :stayed the same  Social Determinants of Health:  .The patient's social determinants of health were a factor in the care of this patient  Problem List / ED Course:  Patient presents to the ED after trying to go to [facility] for evaluation he wants to be clear from crystal meth.  He reports he went on a "bender ", for the last few 10 days   Dispostion:  After consideration of the diagnostic results and the patients response to treatment, I feel that the patent would benefit from admission for further management of hypokalemia, and elevated CK.   Portions of this note were generated with Scientist, clinical (histocompatibility and immunogenetics). Dictation errors may occur despite best attempts at proofreading.   Final Clinical Impression(s) / ED Diagnoses Final diagnoses:  Elevated CK  Hypokalemia    Rx / DC Orders ED Discharge Orders     None

## 2023-11-17 NOTE — ED Provider Notes (Signed)
 [facility] Medical Screening Exam  Patient Name: Jimmy Fisher MRN: [id number] Date of Evaluation: 11/17/23 Chief Complaint:   Diagnosis:  Final diagnoses:  Alcohol use disorder  Stimulant use disorder    History of Present illness:   Jimmy Fisher is a 43 y.o. male with a PPHx of MDD, anxiety, AUD, and stimulant use d/o meth type who presents to the [facility] unaccompanied requesting for detox and rehab services. He reports relapsing on alcohol and meth (IV use) 10 days ago and he has been binging ever since.He reports drinking 1/5 of liquor daily. He denies history of withdrawal AVH, seizures, and DT. He reports using meth 0.5 mg for the past 10 days. He reports his last use is 2 days ago. He reports going to [facility] and he told him to get detox prior to rehabilitation.   Patient has been diagnosed with MDD and anxiety and takes Prozac  which he reports he has not been taking during his 10 day substance use binge but prior to 10 days, he was compliant. His medications are managed by his PCP and he has been on Prozac  for years. He currently denies SI, HI, and AVH. He states the last time that he had SI was yesterday. He reports minimal sleep for 5 days due to the drug use. His motivating factors include his life.   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 He is divorces with two children ages 42 and 42. He reports having upcoming court dates but is unsure when.  On exam, patient has two injection sites. On his right cubital fossa, the injection site is inflamed, erythematous and hard and painful to the touch. He also has an injection site on his right calf that is hard and painful to the touch. I discussed patient will need medical clearance for these injection sites prior to his getting transferred to [facility] and he is agreeable. Patient going to [facility] for medical clearance and can return to [facility] when cleared and medicated  if appropriate.  Flowsheet Row ED from 11/17/2023 in [facility] ED from 04/25/2023 in [facility] ED from 11/10/2021 in [facility] Emergency Department at [facility]  C-SSRS RISK CATEGORY High Risk Low Risk No Risk       Psychiatric Specialty Exam  Presentation  General Appearance:Appropriate for Environment; Disheveled; Casual  Eye Contact:Fair  Speech:Pressured; Garbled  Speech Volume:Normal  Handedness:Right   Mood and Affect  Mood: Depressed  Affect: Congruent   Thought Process  Thought Processes: Coherent; Linear; Goal Directed  Descriptions of Associations:Intact  Orientation:Full (Time, Place and Person)  Thought Content:Logical  Diagnosis of Schizophrenia or Schizoaffective disorder in past: No   Hallucinations:None  Ideas of Reference:None  Suicidal Thoughts:No  Homicidal Thoughts:No   Sensorium  Memory: Remote Good  Judgment: Impaired  Insight: Fair   Art therapist  Concentration: Good  Attention Span: Good  Recall: Good  Fund of Knowledge: Good  Language: Good   Psychomotor Activity  Psychomotor Activity: Increased; Restlessness   Assets  Assets: Manufacturing systems engineer; Resilience   Sleep  Sleep: Poor   Physical Exam: Physical Exam Vitals reviewed.  Constitutional:      Appearance: Normal appearance.  HENT:     Head: Normocephalic and atraumatic.  Cardiovascular:     Rate and Rhythm: Normal rate.  Pulmonary:     Effort: Pulmonary effort is normal.  Skin:  Findings: Erythema present.     Comments: Erythematous, hard, swollen injection sites on the right forarm and right calf  Neurological:     General: No focal deficit present.     Mental Status: He is alert and oriented to person, place, and time.    Review of Systems  Constitutional:  Negative for chills and fever.  Respiratory:  Negative for shortness of breath.    Cardiovascular:  Negative for chest pain and palpitations.  Gastrointestinal:  Negative for nausea and vomiting.  Neurological:  Positive for headaches.   Blood pressure 125/85, pulse 87, temperature 97.8 F (36.6 C), resp. rate 18, SpO2 100%. There is no height or weight on file to calculate BMI.  Musculoskeletal: Strength & Muscle Tone: within normal limits Gait & Station: normal Patient leans: N/A   [facility] Discharge Disposition for Follow up and Recommendations: Based on my evaluation the patient appears to have an urgent medical condition for which I recommend the patient be transferred to the emergency department for further evaluation. He can return once medically cleared.    Joice Nares, MD 11/17/2023, 4:26 PM

## 2023-11-17 NOTE — ED Triage Notes (Signed)
 Pt came in for bruising and wounds for injections that may be infected. Pt went to [facility] but was told to come here first to get his wounds checked out.

## 2023-11-17 NOTE — ED Notes (Signed)
 PT stated he relapsed on methamphetamine. Contusions on right arm and right leg are injection reactions, swollen, hard, and tender to touch.  HIV positive and hasn't taken any HIV meds in 9 days

## 2023-11-17 NOTE — ED Notes (Signed)
 Patient to be transferred to [facility] via [facility]. [facility] made aware.

## 2023-11-17 NOTE — ED Provider Notes (Signed)
 [place] EMERGENCY DEPARTMENT AT [facility] Provider Note   CSN: [id number] Arrival date & time: 11/17/23  1705     History HIV, substance abuse Chief Complaint  Patient presents with   Wound Check    Jimmy Fisher is a 43 y.o. male.  43 year old male with a past medical history of crystal meth use, HIV presents to the ED with a chief complaint of detox.  Reports he has been on a binge of crystal meth for the past 5 days, reports he was crawling to the bushes, has been all over [place] .  Has multiple sources of failed injections, these have been erythematous and streaking.  He also reports he has not had any of his HIV medications in about 10 days.  He [facility], however states that he needed to have his wound checked prior to this.  He continues to feel overall itchy, reports discomfort throughout.  Arrived to the ED afebrile, no other complaint reported.  The history is provided by the patient.  Wound Check This is a recurrent problem. Pertinent negatives include no chest pain, no abdominal pain and no shortness of breath.       Home Medications Prior to Admission medications   Medication Sig Start Date End Date Taking? Authorizing Provider  atorvastatin  (LIPITOR) 10 MG tablet Take 1 tablet (10 mg total) by mouth daily. 10/04/23  Yes Sandie Cross, MD  bictegravir-emtricitabine -tenofovir  AF (BIKTARVY ) 50-200-25 MG TABS tablet Take 1 tablet by mouth daily. 09/07/23  Yes Jamesetta Mcbride, PA-C  FLUoxetine  (PROZAC ) 40 MG capsule Take 1 capsule (40 mg total) by mouth daily. 04/28/23 04/27/24 Yes Sandie Cross, MD  olmesartan  (BENICAR ) 20 MG tablet Take 1 tablet (20 mg total) by mouth daily. 04/28/23 01/03/24 Yes Sandie Cross, MD  omeprazole  (PRILOSEC) 20 MG capsule Take 1 capsule (20 mg total) by mouth 2 (two) times daily. 09/21/23  Yes Sandie Cross, MD  Calcium  Polycarbophil (FIBER) 625 MG TABS Take 1 tablet (625 mg  total) by mouth daily. Patient not taking: Reported on 11/18/2023 12/20/22   Mayers, Cari S, PA-C  lidocaine  (LIDODERM ) 5 % Place 1 patch onto the skin daily. Remove & Discard patch within 12 hours or as directed by MD Patient not taking: Reported on 11/18/2023 12/20/22   Mayers, Cari S, PA-C  sucralfate  (CARAFATE ) 1 g tablet Take 1 tablet (1 g total) by mouth 4 (four) times daily. 08/27/21 08/28/21  Elois Hair, MD      Allergies    Doxycycline     Review of Systems   Review of Systems  Constitutional:  Negative for fever.  Respiratory:  Negative for shortness of breath.   Cardiovascular:  Negative for chest pain.  Gastrointestinal:  Positive for nausea. Negative for abdominal pain and vomiting.  Genitourinary:  Negative for flank pain.  Skin:  Positive for wound (Multiple wounds throughout his body.).  All other systems reviewed and are negative.   Physical Exam Updated Vital Signs BP (!) 142/95 (BP Location: Left Arm)   Pulse 67   Temp 98.1 F (36.7 C) (Oral)   Resp 20   Ht 5' 6 (1.676 m)   Wt 81.6 kg   SpO2 98%   BMI 29.04 kg/m  Physical Exam Vitals and nursing note reviewed.  Constitutional:      Comments: Appears very disheveled, very poor hygiene.  HENT:     Head: Normocephalic and atraumatic.     Mouth/Throat:  Mouth: Mucous membranes are moist.  Eyes:     Comments: Conjunctiva is injected bilaterally.  Pulmonary:     Effort: Pulmonary effort is normal.  Abdominal:     General: Abdomen is flat.     Comments: Multiple wells, streaking in the skin throughout his entire abdomen.  Skin:    General: Skin is warm and dry.  Neurological:     Mental Status: He is alert and oriented to person, place, and time.  Psychiatric:        Speech: Speech is rapid and pressured.        Behavior: Behavior is agitated.        Thought Content: Thought content is paranoid.     ED Results / Procedures / Treatments   Labs (all labs ordered are listed, but only abnormal  results are displayed) Labs Reviewed  COMPREHENSIVE METABOLIC PANEL WITH GFR - Abnormal; Notable for the following components:      Result Value   Sodium 134 (*)    Potassium 2.9 (*)    Chloride 97 (*)    Glucose, Bld 143 (*)    BUN 24 (*)    Calcium  8.4 (*)    AST 124 (*)    ALT 74 (*)    All other components within normal limits  CBC - Abnormal; Notable for the following components:   WBC 13.1 (*)    All other components within normal limits  RAPID URINE DRUG SCREEN, HOSP PERFORMED - Abnormal; Notable for the following components:   Cocaine POSITIVE (*)    Amphetamines POSITIVE (*)    All other components within normal limits  CK - Abnormal; Notable for the following components:   Total CK 2,566 (*)    All other components within normal limits  CK - Abnormal; Notable for the following components:   Total CK 1,862 (*)    All other components within normal limits  HIV ANTIBODY (ROUTINE TESTING W REFLEX) - Abnormal; Notable for the following components:   HIV Screen 4th Generation wRfx Reactive (*)    All other components within normal limits  CBC - Abnormal; Notable for the following components:   WBC 11.5 (*)    All other components within normal limits  COMPREHENSIVE METABOLIC PANEL WITH GFR - Abnormal; Notable for the following components:   Glucose, Bld 106 (*)    Calcium  8.6 (*)    Total Protein 5.9 (*)    Albumin 3.0 (*)    AST 90 (*)    ALT 62 (*)    All other components within normal limits  HIV-1/2 AB - DIFFERENTIATION - Abnormal; Notable for the following components:   Note SEE COMMENT (*)    All other components within normal limits  COMPREHENSIVE METABOLIC PANEL WITH GFR - Abnormal; Notable for the following components:   Calcium  7.9 (*)    Total Protein 5.3 (*)    Albumin 2.5 (*)    All other components within normal limits  CBC - Abnormal; Notable for the following components:   RBC 4.18 (*)    Hemoglobin 12.7 (*)    All other components within normal  limits  COMPREHENSIVE METABOLIC PANEL WITH GFR - Abnormal; Notable for the following components:   Potassium 3.4 (*)    CO2 21 (*)    Glucose, Bld 125 (*)    Calcium  8.0 (*)    Total Protein 5.3 (*)    Albumin 2.4 (*)    All other components within normal limits  CBC - Abnormal; Notable for the following components:   WBC 11.5 (*)    RBC 4.07 (*)    Hemoglobin 12.5 (*)    HCT 38.6 (*)    All other components within normal limits  COMPREHENSIVE METABOLIC PANEL WITH GFR - Abnormal; Notable for the following components:   Glucose, Bld 131 (*)    Calcium  8.3 (*)    Total Protein 6.1 (*)    Albumin 2.8 (*)    All other components within normal limits  CBC - Abnormal; Notable for the following components:   WBC 11.4 (*)    RBC 3.84 (*)    Hemoglobin 11.7 (*)    HCT 36.4 (*)    All other components within normal limits  COMPREHENSIVE METABOLIC PANEL WITH GFR - Abnormal; Notable for the following components:   Sodium 134 (*)    Glucose, Bld 132 (*)    Calcium  7.9 (*)    Total Protein 5.6 (*)    Albumin 2.7 (*)    AST 59 (*)    ALT 53 (*)    All other components within normal limits  CULTURE, BLOOD (ROUTINE X 2)  CULTURE, BLOOD (ROUTINE X 2)  AEROBIC/ANAEROBIC CULTURE W GRAM STAIN (SURGICAL/DEEP WOUND)  ETHANOL  T-HELPER CELLS (CD4) COUNT (NOT AT [facility])  HEPATITIS PANEL, ACUTE  CK  RPR  HIV-1 RNA QUANT-NO REFLEX-BLD    EKG EKG Interpretation Date/Time:  Thursday November 17 2023 22:16:57 EDT Ventricular Rate:  78 PR Interval:  175 QRS Duration:  120 QT Interval:  484 QTC Calculation: 552 R Axis:   27  Text Interpretation: Sinus rhythm IVCD, consider atypical RBBB Confirmed by Scarlette Currier ([id number]) on 11/19/2023 12:52:24 AM  Radiology No results found.  Procedures Procedures    Medications Ordered in ED Medications  LORazepam  (ATIVAN ) tablet 1-4 mg (2 mg Oral Given 11/20/23 0809)    Or  LORazepam  (ATIVAN ) injection 1-4 mg ( Intravenous See Alternative 11/20/23  0809)  thiamine  (VITAMIN B1) tablet 100 mg (100 mg Oral Given 11/23/23 1012)  folic acid  (FOLVITE ) tablet 1 mg (1 mg Oral Given 11/23/23 1012)  multivitamin with minerals tablet 1 tablet (1 tablet Oral Given 11/23/23 1206)  bictegravir-emtricitabine -tenofovir  AF (BIKTARVY ) 50-200-25 MG per tablet 1 tablet (1 tablet Oral Given 11/23/23 1012)  enoxaparin  (LOVENOX ) injection 40 mg (40 mg Subcutaneous Given 11/23/23 1011)  lactated ringers  infusion (0 mLs Intravenous Stopped 11/19/23 0356)  sodium chloride  flush (NS) 0.9 % injection 3 mL (3 mLs Intravenous Not Given 11/23/23 1011)  sodium chloride  flush (NS) 0.9 % injection 3 mL (3 mLs Intravenous Not Given 11/23/23 1010)  sodium chloride  flush (NS) 0.9 % injection 3 mL (3 mLs Intravenous Given 11/22/23 1237)  0.9 %  sodium chloride  infusion (has no administration in time range)  acetaminophen  (TYLENOL ) tablet 650 mg (650 mg Oral Given 11/23/23 0027)    Or  acetaminophen  (TYLENOL ) suppository 650 mg ( Rectal See Alternative 11/23/23 0027)  ondansetron  (ZOFRAN ) tablet 4 mg (4 mg Oral Given 11/22/23 1752)    Or  ondansetron  (ZOFRAN ) injection 4 mg ( Intravenous See Alternative 11/22/23 1752)  diphenhydrAMINE  (BENADRYL ) injection 25 mg (25 mg Intravenous Given 11/21/23 2127)  FLUoxetine  (PROZAC ) capsule 20 mg (20 mg Oral Given 11/23/23 1012)  lactated ringers  infusion ( Intravenous Stopped 11/20/23 0217)  gabapentin  (NEURONTIN ) capsule 300 mg (300 mg Oral Given 11/23/23 1012)  oxyCODONE  (Oxy IR/ROXICODONE ) immediate release tablet 5 mg (5 mg Oral Given 11/23/23 0027)  linezolid (ZYVOX) tablet 600 mg (600  mg Oral Given 11/23/23 1011)  hydrOXYzine  (ATARAX ) tablet 25 mg (25 mg Oral Given 11/23/23 1011)  loratadine  (CLARITIN ) tablet 10 mg (10 mg Oral Given 11/23/23 1012)  predniSONE  (DELTASONE ) tablet 20 mg (20 mg Oral Given 11/23/23 1012)  Oral care mouth rinse ( Mouth Rinse MAR Unhold 11/21/23 1845)  lactated ringers  infusion ( Intravenous Infusion Verify 11/22/23 0224)   amoxicillin-clavulanate (AUGMENTIN) 875-125 MG per tablet 1 tablet (1 tablet Oral Given 11/23/23 1012)  irbesartan (AVAPRO) tablet 150 mg (150 mg Oral Given 11/23/23 1011)  alum & mag hydroxide-simeth (MAALOX/MYLANTA) 200-200-20 MG/5ML suspension 30 mL (30 mLs Oral Given 11/23/23 0246)  pantoprazole  (PROTONIX ) EC tablet 40 mg (40 mg Oral Given 11/23/23 1206)  potassium chloride  10 mEq in 100 mL IVPB (0 mEq Intravenous Stopped 11/17/23 2341)  magnesium  oxide (MAG-OX) tablet 800 mg (800 mg Oral Given 11/17/23 2210)  potassium chloride  SA (KLOR-CON  M) CR tablet 40 mEq (40 mEq Oral Given 11/17/23 2210)  sodium chloride  0.9 % bolus 1,000 mL (0 mLs Intravenous Stopped 11/18/23 0056)  vancomycin  (VANCOCIN ) IVPB 1000 mg/200 mL premix (0 mg Intravenous Stopped 11/18/23 0148)  ceFEPIme  (MAXIPIME ) 2 g in sodium chloride  0.9 % 100 mL IVPB (0 g Intravenous Stopped 11/18/23 0056)  potassium chloride  (KLOR-CON ) packet 40 mEq (40 mEq Oral Given 11/20/23 1019)  chlorhexidine  (PERIDEX ) 0.12 % solution 15 mL (15 mLs Mouth/Throat Given 11/21/23 1458)    ED Course/ Medical Decision Making/ A&P Clinical Course as of 11/23/23 1405  Thu Nov 17, 2023  2135 WBC(!): 13.1 [JS]  2136 Potassium(!): 2.9 [JS]  2343 CK Total(!): 2,566 [JS]  Fri Nov 18, 2023  0105 Consult to Dr. Ulice Gamer, hospitalist who is agreeable to admitting this patient to her service.  I appreciate her collaboration in care of this patient.  Per our discussion we will initiate CIWA protocol given patient's endorsed history of alcohol abuse at the behavioral urgent care earlier in the day. [RS]    Clinical Course User Index [JS] Abdelaziz Westenberger, PA-C [RS] Sponseller, Adelle Agent, PA-C                                 Medical Decision Making Amount and/or Complexity of Data Reviewed Labs: ordered. Decision-making details documented in ED Course.  Risk OTC drugs. Prescription drug management. Decision regarding hospitalization.    This patient presents to the ED  for concern of HIV, Substance abuse, this involves a number of treatment options, and is a complaint that carries with it a high risk of complications and morbidity.  The differential diagnosis includes cellulitis, sepsis.   Co morbidities: Discussed in HPI   Brief History:  See HPI.   EMR reviewed including pt PMHx, past surgical history and past visits to ER.   See HPI for more details   Lab Tests:  I ordered and independently interpreted labs.  The pertinent results include:    Labs notable for CBC with a slight leukocytosis of 13.1, hemoglobin stable.  CMP with slight decrease in sodium.  Potassium is low at 2.9, current levels unremarkable.  LFTs are within normal limits.  Imaging Studies:  No imaging studies ordered for this patient  Cardiac Monitoring:  The patient was maintained on a cardiac monitor.  I personally viewed and interpreted the cardiac monitored which showed an underlying rhythm of: NSR  EKG non-ischemic  Medicines ordered:  I ordered medication including potassium, fluids  for symptomatic treatment. Reevaluation of  the patient after these medicines showed that the patient improved I have reviewed the patients home medicines and have made adjustments as needed  Reevaluation:  After the interventions noted above I re-evaluated patient and found that they have :stayed the same  Social Determinants of Health:  The patient's social determinants of health were a factor in the care of this patient  Problem List / ED Course:  Patient presents to the ED after trying to go to [facility] for evaluation he wants to be clear from crystal meth.  He reports he went on a bender , for the last few 10 days in addition, he has been crawling through the woods.  He has multiple injection sites around his upper extremities and lower extremities that appear infected with some erythema and streaking in the skin.  He was told by [facility] that he needed to have his wound checks  prior to arriving to [facility]. In addition patient is HIV positive, reports he has not had any medications in the past 10 days.  Here his white blood cell count is slightly elevated at 13.1, CMP with remarkable hypokalemia with a potassium of 2.9 which was IV along with orally replaced.  BUN is elevated at 24, suspect likely no oral intake over the last couple days.  CK is 2500, given a liter to help with symptoms. Originally was attempting to have patient medically stable in order to receive care at [facility], however with the low potassium along with elevated CK I do feel that he is best to be hospitalized at this time for further medical clearance.  Dispostion:  After consideration of the diagnostic results and the patients response to treatment, I feel that the patent would benefit from admission for further management of hypokalemia, and elevated CK.   Portions of this note were generated with Scientist, clinical (histocompatibility and immunogenetics). Dictation errors may occur despite best attempts at proofreading.   Final Clinical Impression(s) / ED Diagnoses Final diagnoses:  Elevated CK  Hypokalemia    Rx / DC Orders ED Discharge Orders     None         Cosme Jacob, PA-C 11/23/23 1405    Kommor, Alyse July, MD 11/24/23 2215

## 2023-11-17 NOTE — Discharge Instructions (Signed)

## 2023-11-18 ENCOUNTER — Encounter (HOSPITAL_COMMUNITY): Payer: Self-pay | Admitting: Internal Medicine

## 2023-11-18 DIAGNOSIS — M797 Fibromyalgia: Secondary | ICD-10-CM | POA: Diagnosis present

## 2023-11-18 DIAGNOSIS — E785 Hyperlipidemia, unspecified: Secondary | ICD-10-CM | POA: Diagnosis not present

## 2023-11-18 DIAGNOSIS — L03114 Cellulitis of left upper limb: Secondary | ICD-10-CM | POA: Diagnosis present

## 2023-11-18 DIAGNOSIS — L02413 Cutaneous abscess of right upper limb: Secondary | ICD-10-CM | POA: Diagnosis present

## 2023-11-18 DIAGNOSIS — L03113 Cellulitis of right upper limb: Secondary | ICD-10-CM

## 2023-11-18 DIAGNOSIS — F1410 Cocaine abuse, uncomplicated: Secondary | ICD-10-CM | POA: Diagnosis present

## 2023-11-18 DIAGNOSIS — Z91148 Patient's other noncompliance with medication regimen for other reason: Secondary | ICD-10-CM | POA: Diagnosis not present

## 2023-11-18 DIAGNOSIS — L03119 Cellulitis of unspecified part of limb: Secondary | ICD-10-CM

## 2023-11-18 DIAGNOSIS — L03019 Cellulitis of unspecified finger: Secondary | ICD-10-CM

## 2023-11-18 DIAGNOSIS — Z881 Allergy status to other antibiotic agents status: Secondary | ICD-10-CM | POA: Diagnosis not present

## 2023-11-18 DIAGNOSIS — F1090 Alcohol use, unspecified, uncomplicated: Secondary | ICD-10-CM | POA: Insufficient documentation

## 2023-11-18 DIAGNOSIS — M7989 Other specified soft tissue disorders: Secondary | ICD-10-CM | POA: Diagnosis present

## 2023-11-18 DIAGNOSIS — Z21 Asymptomatic human immunodeficiency virus [HIV] infection status: Secondary | ICD-10-CM

## 2023-11-18 DIAGNOSIS — F411 Generalized anxiety disorder: Secondary | ICD-10-CM | POA: Diagnosis present

## 2023-11-18 DIAGNOSIS — F17290 Nicotine dependence, other tobacco product, uncomplicated: Secondary | ICD-10-CM | POA: Diagnosis present

## 2023-11-18 DIAGNOSIS — I38 Endocarditis, valve unspecified: Secondary | ICD-10-CM | POA: Diagnosis not present

## 2023-11-18 DIAGNOSIS — L309 Dermatitis, unspecified: Secondary | ICD-10-CM | POA: Diagnosis present

## 2023-11-18 DIAGNOSIS — Z888 Allergy status to other drugs, medicaments and biological substances status: Secondary | ICD-10-CM | POA: Diagnosis not present

## 2023-11-18 DIAGNOSIS — Z79899 Other long term (current) drug therapy: Secondary | ICD-10-CM | POA: Diagnosis not present

## 2023-11-18 DIAGNOSIS — Z86711 Personal history of pulmonary embolism: Secondary | ICD-10-CM | POA: Insufficient documentation

## 2023-11-18 DIAGNOSIS — I10 Essential (primary) hypertension: Secondary | ICD-10-CM | POA: Diagnosis present

## 2023-11-18 DIAGNOSIS — Y900 Blood alcohol level of less than 20 mg/100 ml: Secondary | ICD-10-CM | POA: Diagnosis present

## 2023-11-18 DIAGNOSIS — L03311 Cellulitis of abdominal wall: Secondary | ICD-10-CM | POA: Diagnosis present

## 2023-11-18 DIAGNOSIS — M6282 Rhabdomyolysis: Secondary | ICD-10-CM | POA: Diagnosis present

## 2023-11-18 DIAGNOSIS — E7800 Pure hypercholesterolemia, unspecified: Secondary | ICD-10-CM | POA: Diagnosis present

## 2023-11-18 DIAGNOSIS — B954 Other streptococcus as the cause of diseases classified elsewhere: Secondary | ICD-10-CM | POA: Diagnosis present

## 2023-11-18 DIAGNOSIS — Z5900 Homelessness unspecified: Secondary | ICD-10-CM | POA: Diagnosis not present

## 2023-11-18 DIAGNOSIS — F332 Major depressive disorder, recurrent severe without psychotic features: Secondary | ICD-10-CM | POA: Diagnosis present

## 2023-11-18 DIAGNOSIS — F1010 Alcohol abuse, uncomplicated: Secondary | ICD-10-CM | POA: Diagnosis present

## 2023-11-18 DIAGNOSIS — E876 Hypokalemia: Secondary | ICD-10-CM | POA: Insufficient documentation

## 2023-11-18 DIAGNOSIS — B20 Human immunodeficiency virus [HIV] disease: Secondary | ICD-10-CM | POA: Diagnosis present

## 2023-11-18 DIAGNOSIS — Z9151 Personal history of suicidal behavior: Secondary | ICD-10-CM | POA: Diagnosis not present

## 2023-11-18 DIAGNOSIS — E7849 Other hyperlipidemia: Secondary | ICD-10-CM

## 2023-11-18 DIAGNOSIS — F1990 Other psychoactive substance use, unspecified, uncomplicated: Secondary | ICD-10-CM | POA: Diagnosis not present

## 2023-11-18 LAB — CBC
HCT: 39.8 % (ref 39.0–52.0)
Hemoglobin: 13.2 g/dL (ref 13.0–17.0)
MCH: 30.7 pg (ref 26.0–34.0)
MCHC: 33.2 g/dL (ref 30.0–36.0)
MCV: 92.6 fL (ref 80.0–100.0)
Platelets: 349 10*3/uL (ref 150–400)
RBC: 4.3 MIL/uL (ref 4.22–5.81)
RDW: 12.8 % (ref 11.5–15.5)
WBC: 11.5 10*3/uL — ABNORMAL HIGH (ref 4.0–10.5)
nRBC: 0 % (ref 0.0–0.2)

## 2023-11-18 LAB — COMPREHENSIVE METABOLIC PANEL WITH GFR
ALT: 62 U/L — ABNORMAL HIGH (ref 0–44)
AST: 90 U/L — ABNORMAL HIGH (ref 15–41)
Albumin: 3 g/dL — ABNORMAL LOW (ref 3.5–5.0)
Alkaline Phosphatase: 63 U/L (ref 38–126)
Anion gap: 10 (ref 5–15)
BUN: 17 mg/dL (ref 6–20)
CO2: 23 mmol/L (ref 22–32)
Calcium: 8.6 mg/dL — ABNORMAL LOW (ref 8.9–10.3)
Chloride: 105 mmol/L (ref 98–111)
Creatinine, Ser: 0.88 mg/dL (ref 0.61–1.24)
GFR, Estimated: >60 mL/min (ref >60)
Glucose, Bld: 106 mg/dL — ABNORMAL HIGH (ref 70–99)
Potassium: 3.6 mmol/L (ref 3.5–5.1)
Sodium: 138 mmol/L (ref 135–145)
Total Bilirubin: 1.1 mg/dL (ref 0.0–1.2)
Total Protein: 5.9 g/dL — ABNORMAL LOW (ref 6.5–8.1)

## 2023-11-18 LAB — HEPATITIS PANEL, ACUTE
HCV Ab: NONREACTIVE
Hep A IgM: NONREACTIVE
Hep B C IgM: NONREACTIVE
Hepatitis B Surface Ag: NONREACTIVE

## 2023-11-18 LAB — RAPID URINE DRUG SCREEN, HOSP PERFORMED
Amphetamines: POSITIVE — AB
Barbiturates: NOT DETECTED
Benzodiazepines: NOT DETECTED
Cocaine: POSITIVE — AB
Opiates: NOT DETECTED
Tetrahydrocannabinol: NOT DETECTED

## 2023-11-18 LAB — T-HELPER CELLS (CD4) COUNT (NOT AT ARMC)
CD4 % Helper T Cell: 38 % (ref 33–65)
CD4 T Cell Abs: 913 /uL (ref 400–1790)

## 2023-11-18 LAB — CK: Total CK: 1862 U/L — ABNORMAL HIGH (ref 49–397)

## 2023-11-18 LAB — HIV ANTIBODY (ROUTINE TESTING W REFLEX): HIV Screen 4th Generation wRfx: REACTIVE — AB

## 2023-11-18 MED ORDER — ONDANSETRON HCL 4 MG PO TABS
4.0000 mg | ORAL_TABLET | Freq: Four times a day (QID) | ORAL | Status: DC | PRN
  Administered 2023-11-22: 4 mg via ORAL
  Filled 2023-11-18: qty 1

## 2023-11-18 MED ORDER — SODIUM CHLORIDE 0.9 % IV SOLN
2.0000 g | Freq: Three times a day (TID) | INTRAVENOUS | Status: DC
  Administered 2023-11-18 – 2023-11-22 (×13): 2 g via INTRAVENOUS
  Filled 2023-11-18 (×13): qty 12.5

## 2023-11-18 MED ORDER — LACTATED RINGERS IV SOLN: INTRAVENOUS | Status: AC

## 2023-11-18 MED ORDER — SODIUM CHLORIDE 0.9 % IV SOLN
2.0000 g | Freq: Once | INTRAVENOUS | Status: AC
  Administered 2023-11-18: 2 g via INTRAVENOUS
  Filled 2023-11-18: qty 12.5

## 2023-11-18 MED ORDER — FOLIC ACID 1 MG PO TABS
1.0000 mg | ORAL_TABLET | Freq: Every day | ORAL | Status: DC
  Administered 2023-11-18 – 2023-11-24 (×7): 1 mg via ORAL
  Filled 2023-11-18 (×7): qty 1

## 2023-11-18 MED ORDER — ATORVASTATIN CALCIUM 10 MG PO TABS: 10.0000 mg | ORAL_TABLET | Freq: Every day | ORAL | Status: DC

## 2023-11-18 MED ORDER — SODIUM CHLORIDE 0.9% FLUSH
3.0000 mL | Freq: Two times a day (BID) | INTRAVENOUS | Status: DC
  Administered 2023-11-18 – 2023-11-22 (×7): 3 mL via INTRAVENOUS

## 2023-11-18 MED ORDER — FLUOXETINE HCL 20 MG PO CAPS
20.0000 mg | ORAL_CAPSULE | Freq: Every day | ORAL | Status: DC
  Administered 2023-11-18 – 2023-11-24 (×7): 20 mg via ORAL
  Filled 2023-11-18 (×7): qty 1

## 2023-11-18 MED ORDER — VANCOMYCIN HCL 1250 MG/250ML IV SOLN
1250.0000 mg | Freq: Two times a day (BID) | INTRAVENOUS | Status: DC
  Administered 2023-11-18 – 2023-11-20 (×4): 1250 mg via INTRAVENOUS
  Filled 2023-11-18 (×4): qty 250

## 2023-11-18 MED ORDER — THIAMINE HCL 100 MG/ML IJ SOLN
100.0000 mg | Freq: Every day | INTRAMUSCULAR | Status: DC
Start: 2023-11-18 — End: 2023-11-23
  Administered 2023-11-18: 100 mg via INTRAVENOUS
  Filled 2023-11-18: qty 2

## 2023-11-18 MED ORDER — BICTEGRAVIR-EMTRICITAB-TENOFOV 50-200-25 MG PO TABS
1.0000 | ORAL_TABLET | Freq: Every day | ORAL | Status: DC
  Administered 2023-11-18 – 2023-11-24 (×7): 1 via ORAL
  Filled 2023-11-18 (×7): qty 1

## 2023-11-18 MED ORDER — VANCOMYCIN HCL IN DEXTROSE 1-5 GM/200ML-% IV SOLN
1000.0000 mg | Freq: Once | INTRAVENOUS | Status: AC
  Administered 2023-11-18: 1000 mg via INTRAVENOUS
  Filled 2023-11-18: qty 200

## 2023-11-18 MED ORDER — LORAZEPAM 2 MG/ML IJ SOLN: 1.0000 mg | INTRAMUSCULAR | Status: AC | PRN

## 2023-11-18 MED ORDER — SODIUM CHLORIDE 0.9% FLUSH
3.0000 mL | INTRAVENOUS | Status: DC | PRN
  Administered 2023-11-22: 3 mL via INTRAVENOUS

## 2023-11-18 MED ORDER — GABAPENTIN 100 MG PO CAPS
100.0000 mg | ORAL_CAPSULE | Freq: Three times a day (TID) | ORAL | Status: DC
  Administered 2023-11-18 – 2023-11-19 (×4): 100 mg via ORAL
  Filled 2023-11-18 (×4): qty 1

## 2023-11-18 MED ORDER — ACETAMINOPHEN 650 MG RE SUPP: 650.0000 mg | Freq: Four times a day (QID) | RECTAL | Status: DC | PRN

## 2023-11-18 MED ORDER — DIPHENHYDRAMINE HCL 50 MG/ML IJ SOLN
25.0000 mg | Freq: Four times a day (QID) | INTRAMUSCULAR | Status: DC | PRN
  Administered 2023-11-18 – 2023-11-21 (×5): 25 mg via INTRAVENOUS
  Filled 2023-11-18 (×6): qty 1

## 2023-11-18 MED ORDER — SODIUM CHLORIDE 0.9 % IV SOLN: 250.0000 mL | INTRAVENOUS | Status: AC | PRN

## 2023-11-18 MED ORDER — VANCOMYCIN HCL IN DEXTROSE 1-5 GM/200ML-% IV SOLN
1000.0000 mg | Freq: Two times a day (BID) | INTRAVENOUS | Status: DC
  Administered 2023-11-18: 1000 mg via INTRAVENOUS
  Filled 2023-11-18: qty 200

## 2023-11-18 MED ORDER — SODIUM CHLORIDE 0.9% FLUSH
3.0000 mL | Freq: Two times a day (BID) | INTRAVENOUS | Status: DC
  Administered 2023-11-18 – 2023-11-22 (×8): 3 mL via INTRAVENOUS

## 2023-11-18 MED ORDER — ADULT MULTIVITAMIN W/MINERALS CH
1.0000 | ORAL_TABLET | Freq: Every day | ORAL | Status: DC
  Administered 2023-11-18 – 2023-11-24 (×7): 1 via ORAL
  Filled 2023-11-18 (×7): qty 1

## 2023-11-18 MED ORDER — ACETAMINOPHEN 325 MG PO TABS
650.0000 mg | ORAL_TABLET | Freq: Four times a day (QID) | ORAL | Status: DC | PRN
  Administered 2023-11-20 – 2023-11-24 (×7): 650 mg via ORAL
  Filled 2023-11-18 (×7): qty 2

## 2023-11-18 MED ORDER — LORAZEPAM 1 MG PO TABS
1.0000 mg | ORAL_TABLET | ORAL | Status: AC | PRN
  Administered 2023-11-18: 1 mg via ORAL
  Administered 2023-11-18 – 2023-11-20 (×3): 2 mg via ORAL
  Filled 2023-11-18: qty 1
  Filled 2023-11-18 (×3): qty 2

## 2023-11-18 MED ORDER — ONDANSETRON HCL 4 MG/2ML IJ SOLN: 4.0000 mg | Freq: Four times a day (QID) | INTRAMUSCULAR | Status: DC | PRN

## 2023-11-18 MED ORDER — THIAMINE MONONITRATE 100 MG PO TABS
100.0000 mg | ORAL_TABLET | Freq: Every day | ORAL | Status: DC
  Administered 2023-11-19 – 2023-11-24 (×6): 100 mg via ORAL
  Filled 2023-11-18 (×6): qty 1

## 2023-11-18 MED ORDER — ENOXAPARIN SODIUM 40 MG/0.4ML IJ SOSY
40.0000 mg | PREFILLED_SYRINGE | INTRAMUSCULAR | Status: DC
  Administered 2023-11-18 – 2023-11-24 (×7): 40 mg via SUBCUTANEOUS
  Filled 2023-11-18 (×7): qty 0.4

## 2023-11-18 NOTE — Progress Notes (Signed)
 Pharmacy Antibiotic Note  Jimmy Fisher is a 43 y.o. male admitted on 11/17/2023 with abdominal wall cellulitis & possible infected wounds on upper & lower bilateral extremities at IV injection sites.  Pharmacy has been consulted for Cefepime + Vancomycin dosing.  Plan: Cefepime 2gm IV q8h Vancomycin 1gm IV q12h to target AUC 400-550.   Estimated AUC on this regimen = 497 Monitor renal function and cx data  Check levels as needed   Height: 5\' 6"  (167.6 cm) Weight: 81.6 kg (180 lb) IBW/kg (Calculated) : 63.8  Temp (24hrs), Avg:98 F (36.7 C), Min:97.8 F (36.6 C), Max:98.7 F (37.1 C)  Recent Labs  Lab 11/17/23 2002  WBC 13.1*  CREATININE 1.09    Estimated Creatinine Clearance: 85.8 mL/min (by C-G formula based on SCr of 1.09 mg/dL).    Allergies  Allergen Reactions   Doxycycline  Rash    ? Sun exposed rash from doxy vs adverse drug rxn vs completely not related    Antimicrobials this admission: 6/6 Cefepime >>  6/6 Vancomycin >>   Dose adjustments this admission:  Microbiology results: 6/5 BCx:   Thank you for allowing pharmacy to be a part of this patient's care.  Arie Kurtz PharmD 11/18/2023 4:11 AM

## 2023-11-18 NOTE — Plan of Care (Signed)
   Problem: Education: Goal: Knowledge of General Education information will improve Description Including pain rating scale, medication(s)/side effects and non-pharmacologic comfort measures Outcome: Progressing   Problem: Health Behavior/Discharge Planning: Goal: Ability to manage health-related needs will improve Outcome: Progressing

## 2023-11-18 NOTE — Plan of Care (Signed)
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
## 2023-11-18 NOTE — ED Provider Notes (Addendum)
  Physical Exam  BP 121/71 (BP Location: Right Arm)   Pulse 77   Temp 98.7 F (37.1 C) (Oral)   Resp 11   SpO2 100%   Physical Exam  Procedures  Procedures  ED Course / MDM   Clinical Course as of 11/18/23 0320  Thu Nov 17, 2023  2135 WBC(!): 13.1 [JS]  2136 Potassium(!): 2.9 [JS]  2343 CK Total(!): 2,566 [JS]  Fri Nov 18, 2023  0105 Consult to Dr. Sundil, hospitalist who is agreeable to admitting this patient to her service.  I appreciate her collaboration in care of this patient.  Per our discussion we will initiate CIWA protocol given patient's endorsed history of alcohol abuse at the behavioral urgent care earlier in the day. [RS]    Clinical Course User Index [JS] Soto, Johana, PA-C [RS] Darriona Dehaas, Adelle Agent, PA-C   Medical Decision Making Amount and/or Complexity of Data Reviewed Labs: ordered. Decision-making details documented in ED Course.  Risk OTC drugs. Prescription drug management. Decision regarding hospitalization.   Give this patient assumed from preceding ED provider Achille Ache, PA-C at time of shift change.  Please see her associated note for further details of patient's ED course.  In brief patient is a 43 year old male with history of HIV poorly compliant with medications, crystal methamphetamine use who presents to the emergency department requesting detox after binge of meth use for the last 5 days.  Also states that he has been crawling to the woods after getting hide most recently, states he has been traveling all over [place] .  Per preceding provider multiple sites have failed injections in the past with concern for erythema and streaking concerning for cellulitis. Found to be hypokalemic to 2.9 with elevated CK of 2500.  Transmaninitis. Receiving IV antibiotics and fluid bolus at time of shift change, pending hospitalist admission.    Patient sleeping extremity present to the bedside, under his blanket, not wanting to come out to talk with  this provider.  Consult to hospitalist as above. Patient admitted to medicine for completion of workup and stabilization.   This chart was dictated using voice recognition software, Dragon. Despite the best efforts of this provider to proofread and correct errors, errors may still occur which can change documentation meaning.       Kae Oram, PA-C 11/18/23 0320    Carlyon Nolasco, Adelle Agent, PA-C 11/18/23 0321    Edson Graces, MD 11/18/23 [phone]

## 2023-11-18 NOTE — Progress Notes (Signed)
 Pharmacy Antibiotic Note  Jimmy Fisher is a 43 y.o. male admitted on 11/17/2023 with abdominal wall cellulitis & possible infected wounds on upper & lower bilateral extremities at IV injection sites.  Pharmacy has been consulted for Cefepime + Vancomycin dosing.  PM: Updated SCr significantly lower  Plan: Continue Cefepime 2g IV q8 hr Increase vancomycin to 1250 mg IV q12 hr (eAUC 503 w/ SCr 0.88; Vd 0.72)   Height: 5\' 6"  (167.6 cm) Weight: 81.6 kg (180 lb) IBW/kg (Calculated) : 63.8  Temp (24hrs), Avg:98.2 F (36.8 C), Min:97.8 F (36.6 C), Max:98.9 F (37.2 C)  Recent Labs  Lab 11/17/23 2002 11/18/23 0417  WBC 13.1* 11.5*  CREATININE 1.09 0.88    Estimated Creatinine Clearance: 106.3 mL/min (by C-G formula based on SCr of 0.88 mg/dL).    Allergies  Allergen Reactions   Doxycycline  Rash    ? Sun exposed rash from doxy vs adverse drug rxn vs completely not related    Antimicrobials this admission: 6/6 Cefepime >>  6/6 Vancomycin >>   Dose adjustments this admission: 6/6: incr vanc 1000 mg >> 1250 mg q12 with improved CrCl  Microbiology results: 6/5 BCx:   Thank you for allowing pharmacy to be a part of this patient's care.  Alijah Akram A PharmD 11/18/2023 12:25 PM

## 2023-11-18 NOTE — Consult Note (Signed)
 [facility] Psychiatric Consult Initial  Patient Name: [name]  MRN: [id number]  DOB: [birth year]  Consult Order details:  Orders (From admission, onward)     Start     Ordered   11/18/23 0318  IP CONSULT TO PSYCHIATRY       Ordering Provider: Sundil, Subrina, MD  Provider:  (Not yet assigned)  Question Answer Comment  Location [facility]   Reason for Consult? Major depressive disorder and generalized anxiety disorder.      11/18/23 0317             Mode of Visit: In person    Psychiatry Consult Evaluation  Service Date: November 18, 2023 LOS:  LOS: 0 days  Chief Complaint "I'm here because I want detox."  Primary Psychiatric Diagnoses  Stimulant use d/o 2.  MDD   Assessment  Jimmy Fisher is a 43 y.o. male admitted: Medicallyfor 11/17/2023  5:48 PM for cellulitis. He carries the psychiatric diagnoses of stimulant use d/o and MDD and has a past medical history of cellulitis, fibromyalgia, HIV, HTN, hyperlipidemia.   His current presentation of using stimulants daily, inability to stop, needing higher doses to achieve the same effect, withdrawal symptoms when not use, and consequences to his use in the social and employment realms are most consistent with stimulant use d/o. Current outpatient psychotropic medications include Prozac  and historically he has had a positive response to these medications. He was non compliant with medications prior to admission as evidenced by self-admission. On initial examination, patient was drowsy, dozed off frequently. Please see plan below for detailed recommendations.   Diagnoses:  Active Hospital problems: Principal Problem:   Cellulitis of extremity Active Problems:   Major depressive disorder, recurrent severe without psychotic features (HCC)   Hyperlipidemia   Essential hypertension   Methamphetamine abuse (HCC)   Polysubstance abuse (HCC)   IVDU (intravenous drug user)   Rhabdomyolysis   Alcohol  use   History of HIV infection (HCC)   History of pulmonary embolism   Hypokalemia    Plan   ## Psychiatric Medication Recommendations:  Stimulant use d/o: Gabapentin 100 mg TID Rehab request when medically cleared  MDD: Restarted Prozac  at 20 mg daily  ## Medical Decision Making Capacity: Not specifically addressed in this encounter  ## Further Work-up:  -- most recent EKG on 11/17/23 had QtC of 552 -- Pertinent labwork reviewed earlier this admission includes: CBC with diff, chem panel, toxicology, EKG   ## Disposition:-- Rehab when medically cleared  ## Behavioral / Environmental: - No specific recommendations at this time.     ## Safety and Observation Level:  - Based on my clinical evaluation, I estimate the patient to be at low risk of self harm in the current setting. - At this time, we recommend  routine. This decision is based on my review of the chart including patient's history and current presentation, interview of the patient, mental status examination, and consideration of suicide risk including evaluating suicidal ideation, plan, intent, suicidal or self-harm behaviors, risk factors, and protective factors. This judgment is based on our ability to directly address suicide risk, implement suicide prevention strategies, and develop a safety plan while the patient is in the clinical setting. Please contact our team if there is a concern that risk level has changed.  CSSR Risk Category:C-SSRS RISK CATEGORY: No Risk  Suicide Risk Assessment: Patient has following modifiable risk factors for suicide: medication noncompliance, which we are addressing by restarting his medications.  Patient has following non-modifiable or demographic risk factors for suicide: male gender, history of suicide attempt, history of self harm behavior, and psychiatric hospitalization Patient has the following protective factors against suicide: Access to outpatient mental health care  Thank you  for this consult request. Recommendations have been communicated to the primary team.  We will continue to follow at this time.   Roslynn Coombes, NP       History of Present Illness  Relevant Aspects of [facility] Course:  Admitted on 11/17/2023 for cellulitis related to drug abuse.   Patient Report: Admission to [facility] on 6/5 by Dr. Camillo Celestine: Jimmy Fisher is a 43 y.o. male with a PPHx of MDD, anxiety, AUD, and stimulant use d/o meth type who presents to the [facility] unaccompanied requesting for detox and rehab services. He reports relapsing on alcohol and meth (IV use) 10 days ago and he has been binging ever since.He reports drinking 1/5 of liquor daily. He denies history of withdrawal AVH, seizures, and DT. He reports using meth 0.5 mg for the past 10 days. He reports his last use is 2 days ago. He reports going to [facility] and he told him to get detox prior to rehabilitation.    Patient has been diagnosed with MDD and anxiety and takes Prozac  which he reports he has not been taking during his 10 day substance use binge but prior to 10 days, he was compliant. His medications are managed by his PCP and he has been on Prozac  for years. He currently denies SI, HI, and AVH. He states the last time that he had SI was yesterday. He reports minimal sleep for 5 days due to the drug use. His motivating factors include his life.    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 He is divorces with two children ages 42 and 68. He reports having upcoming court dates but is unsure when.  Today in the ED, he was drowsy on the assessment with frequent nodding off making the assessment difficult and minimal information obtained.  He reported his depression is "better, a little bit" with no suicidal ideations, distant past suicide attempts, cutting behaviors in self-harm years ago.  High anxiety with excessive worry, panic attacks on occasion with his last one  yesterday with restlessness, shortness of breath, heart racing, and feeling of doom.  Sleep is "horrible", initiation and maintenance which stimulant abuse most likely affected.  Appetite is "good, I'm starving".  He is interested in rehab after medical clearance.  His last rehab was in [place] in April and [place] in January.    Psych ROS:  Depression: moderate Anxiety:  high Mania (lifetime and current): none Psychosis: (lifetime and current): none  Review of Systems  Musculoskeletal:        Shoulder pain and injection sites  Psychiatric/Behavioral:  Positive for depression and substance abuse. The patient is nervous/anxious.   All other systems reviewed and are negative.    Psychiatric and Social History  Psychiatric History:  Information collected from patient and chart.  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 He denies history of withdrawal AVH, seizures, and DT. He reports using meth 0.5 mg for the past 10 days. He reports his last use is 2 days ago.   Exam Findings  Physical Exam: Completed by the MD, reviewed. Vital Signs:  Temp:  [97.8 F (36.6 C)-98.9 F (37.2 C)] 98.9 F (37.2 C) (06/06 0800) Pulse Rate:  [69-87] 84 (06/06 1030) Resp:  [9-23] 21 (06/06 1030) BP: (118-154)/(71-103) 124/78 (06/06 1030) SpO2:  [94 %-100 %] 96 % (06/06 1030) Weight:  [81.6 kg] 81.6 kg (06/06 0330) Blood pressure 124/78, pulse 84, temperature 98.9 F (37.2 C), temperature  source Oral, resp. rate (!) 21, height 5\' 6"  (1.676 m), weight 81.6 kg, SpO2 96%. Body mass index is 29.05 kg/m.  Physical Exam Vitals and nursing note reviewed.  Constitutional:      Appearance: Normal appearance.  HENT:     Head: Normocephalic.     Nose: Nose normal.  Pulmonary:     Effort: Pulmonary effort is normal.  Musculoskeletal:        General: Normal range of motion.     Cervical back: Normal range of motion.  Neurological:     General: No focal deficit present.     Mental Status: He is alert and oriented to person, place, and time.     Mental Status Exam: General Appearance: Disheveled  Orientation:  Full (Time, Place, and Person)  Memory:  Immediate;   Fair Recent;   Fair Remote;   Fair  Concentration:  Concentration: Poor and Attention Span: Poor  Recall:  Fair  Attention  Poor  Eye Contact:  Minimal  Speech:  Slow  Language:  Fair  Volume:  Decreased  Mood: anxious, depressed  Affect:  Appropriate  Thought Process:  Coherent  Thought Content:  Logical  Suicidal Thoughts:  No  Homicidal Thoughts:  No  Judgement:  Impaired  Insight:  Fair  Psychomotor Activity:  Decreased  Akathisia:  No  Fund of Knowledge:  Fair      Assets:  Leisure Time Resilience  Cognition:  WNL  ADL's:  Intact  AIMS (if indicated):        Other History   These have been pulled in through the EMR, reviewed, and updated if appropriate.  Family History:  The patient's family history includes Cancer in his father; Fibromyalgia in his mother.  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   Neuromuscular disorder (HCC)    fibromyalgia    Pulmonary embolism (HCC)    2012   SLAP tear of shoulder    left   Urticarial rash 12/05/2022   Vaccine counseling 05/09/2023    Surgical History: Past Surgical History:  Procedure Laterality Date   COLONOSCOPY     I & D EXTREMITY Left 07/16/2020   Procedure: IRRIGATION AND DEBRIDEMENT FOREARM;  Surgeon: Arvil Birks, MD;  Location: [facility] OR;  Service: Orthopedics;  Laterality: Left;   SHOULDER ARTHROSCOPY WITH BICEPS TENDON REPAIR Left 10/22/2020   Procedure: LEFT SHOULDER ARTHROSCOPY, BICEPS TENODESIS;  Surgeon:  Wes Hamman, MD;  Location: [facility];  Service: Orthopedics;  Laterality: Left;   SUBACROMIAL DECOMPRESSION Left 10/22/2020   Procedure: SUBACROMIAL DECOMPRESSION;  Surgeon: Wes Hamman, MD;  Location: [facility];  Service: Orthopedics;  Laterality: Left;   UPPER GI ENDOSCOPY       Medications:   Current Facility-Administered Medications:    0.9 %  sodium chloride  infusion, 250 mL, Intravenous, PRN, Sundil, Subrina, MD   acetaminophen  (TYLENOL ) tablet 650 mg, 650 mg, Oral, Q6H PRN **OR** acetaminophen  (TYLENOL ) suppository 650 mg, 650 mg, Rectal, Q6H PRN, Sundil, Subrina, MD   bictegravir-emtricitabine -tenofovir  AF (BIKTARVY ) 50-200-25 MG per tablet 1 tablet, 1 tablet, Oral, Daily, Sundil, Subrina, MD, 1 tablet at 11/18/23 1032   ceFEPIme (MAXIPIME) 2 g in sodium chloride  0.9 % 100 mL IVPB, 2 g, Intravenous, Q8H, Lilliston, Andrea M, RPH, Stopped at 11/18/23 4098   diphenhydrAMINE  (BENADRYL ) injection 25 mg, 25 mg, Intravenous, Q6H PRN, Sundil, Subrina, MD, 25 mg at 11/18/23 0348   enoxaparin  (LOVENOX ) injection 40 mg, 40 mg, Subcutaneous, Q24H, Sundil, Subrina, MD, 40 mg at 11/18/23 0917   folic acid (FOLVITE) tablet 1 mg, 1 mg, Oral, Daily, Sundil, Subrina, MD, 1 mg at 11/18/23 1191   lactated ringers  infusion, , Intravenous, Continuous, Sundil, Subrina, MD, Last Rate: 125 mL/hr at 11/18/23 0445, New Bag at 11/18/23 0445   LORazepam  (ATIVAN )  tablet 1-4 mg, 1-4 mg, Oral, Q1H PRN, 1 mg at 11/18/23 0350 **OR** LORazepam  (ATIVAN ) injection 1-4 mg, 1-4 mg, Intravenous, Q1H PRN, Sundil, Subrina, MD   multivitamin with minerals tablet 1 tablet, 1 tablet, Oral, Daily, Sundil, Subrina, MD   ondansetron  (ZOFRAN ) tablet 4 mg, 4 mg, Oral, Q6H PRN **OR** ondansetron  (ZOFRAN ) injection 4 mg, 4 mg, Intravenous, Q6H PRN, Sundil, Subrina, MD   sodium chloride  flush (NS) 0.9 % injection 3 mL, 3 mL, Intravenous, Q12H, Sundil, Subrina, MD, 3 mL at 11/18/23 4782   sodium chloride  flush (NS) 0.9 % injection 3 mL, 3 mL, Intravenous, Q12H, Sundil, Subrina, MD, 3 mL at 11/18/23 9562   sodium chloride  flush (NS) 0.9 % injection 3 mL, 3 mL, Intravenous, PRN, Sundil, Subrina, MD   thiamine (VITAMIN B1) tablet 100 mg, 100 mg, Oral, Daily **OR** thiamine (VITAMIN B1) injection 100 mg, 100 mg, Intravenous, Daily, Sundil, Subrina, MD, 100 mg at 11/18/23 0910   vancomycin (VANCOCIN) IVPB 1000 mg/200 mL premix, 1,000 mg, Intravenous, Q12H, Lilliston, Andrea M, RPH, Last Rate: 200 mL/hr at 11/18/23 0916, 1,000 mg at 11/18/23 1308  Current Outpatient Medications:    atorvastatin  (LIPITOR) 10 MG tablet, Take 1 tablet (10 mg total) by mouth daily., Disp: 30 tablet, Rfl: 1   bictegravir-emtricitabine -tenofovir  AF (BIKTARVY ) 50-200-25 MG TABS tablet, Take 1 tablet by mouth daily., Disp: 90 tablet, Rfl: 1   FLUoxetine  (PROZAC ) 40 MG capsule, Take 1 capsule (40 mg total) by mouth daily., Disp: 90 capsule, Rfl: 3   olmesartan  (BENICAR ) 20 MG tablet, Take 1 tablet (20 mg total) by mouth daily., Disp: 90 tablet, Rfl: 3   omeprazole  (PRILOSEC) 20 MG capsule, Take 1 capsule (20 mg total) by mouth 2 (two) times daily., Disp: 60 capsule, Rfl: 1   Calcium  Polycarbophil (FIBER) 625 MG TABS, Take 1 tablet (625 mg total) by mouth daily. (Patient not taking: Reported on 11/18/2023), Disp: 30 tablet, Rfl: 1   lidocaine  (LIDODERM ) 5 %, Place 1 patch onto the skin daily. Remove & Discard patch  within 12 hours or as directed by MD (Patient not  taking: Reported on 11/18/2023), Disp: 30 patch, Rfl: 0  Allergies: Allergies  Allergen Reactions   Doxycycline  Rash    ? Sun exposed rash from doxy vs adverse drug rxn vs completely not related    Roslynn Coombes, NP
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
## 2023-11-18 NOTE — ED Notes (Signed)
 Patient refused vitals.

## 2023-11-18 NOTE — Progress Notes (Signed)
 During assessment, patients SI score was high. MD and charge was made aware of this. Sitter case in place, safety assessment completed. SI precautions in place. Patient stated he that he has had a plan to take his life within the past couple of days but states that "he feels fine" now.

## 2023-11-19 ENCOUNTER — Inpatient Hospital Stay (HOSPITAL_COMMUNITY)

## 2023-11-19 DIAGNOSIS — I38 Endocarditis, valve unspecified: Secondary | ICD-10-CM

## 2023-11-19 DIAGNOSIS — F1990 Other psychoactive substance use, unspecified, uncomplicated: Secondary | ICD-10-CM | POA: Diagnosis not present

## 2023-11-19 DIAGNOSIS — F332 Major depressive disorder, recurrent severe without psychotic features: Secondary | ICD-10-CM | POA: Diagnosis not present

## 2023-11-19 DIAGNOSIS — Z86711 Personal history of pulmonary embolism: Secondary | ICD-10-CM

## 2023-11-19 DIAGNOSIS — L03311 Cellulitis of abdominal wall: Secondary | ICD-10-CM | POA: Diagnosis not present

## 2023-11-19 DIAGNOSIS — L03113 Cellulitis of right upper limb: Secondary | ICD-10-CM | POA: Diagnosis not present

## 2023-11-19 LAB — COMPREHENSIVE METABOLIC PANEL WITH GFR
ALT: 42 U/L (ref 0–44)
AST: 31 U/L (ref 15–41)
Albumin: 2.5 g/dL — ABNORMAL LOW (ref 3.5–5.0)
Alkaline Phosphatase: 49 U/L (ref 38–126)
Anion gap: 7 (ref 5–15)
BUN: 11 mg/dL (ref 6–20)
CO2: 23 mmol/L (ref 22–32)
Calcium: 7.9 mg/dL — ABNORMAL LOW (ref 8.9–10.3)
Chloride: 108 mmol/L (ref 98–111)
Creatinine, Ser: 0.95 mg/dL (ref 0.61–1.24)
GFR, Estimated: >60 mL/min (ref >60)
Glucose, Bld: 98 mg/dL (ref 70–99)
Potassium: 3.6 mmol/L (ref 3.5–5.1)
Sodium: 138 mmol/L (ref 135–145)
Total Bilirubin: 0.5 mg/dL (ref 0.0–1.2)
Total Protein: 5.3 g/dL — ABNORMAL LOW (ref 6.5–8.1)

## 2023-11-19 LAB — ECHOCARDIOGRAM COMPLETE
Area-P 1/2: 5.02 cm2
Height: 66 in
S' Lateral: 3.1 cm
Weight: 2880 [oz_av]

## 2023-11-19 LAB — CK: Total CK: 395 U/L (ref 49–397)

## 2023-11-19 MED ORDER — GABAPENTIN 300 MG PO CAPS
300.0000 mg | ORAL_CAPSULE | Freq: Three times a day (TID) | ORAL | Status: DC
  Administered 2023-11-19 – 2023-11-24 (×15): 300 mg via ORAL
  Filled 2023-11-19 (×15): qty 1

## 2023-11-19 MED ORDER — LACTATED RINGERS IV SOLN: INTRAVENOUS | Status: AC
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
## 2023-11-19 NOTE — Consult Note (Addendum)
 [facility] Consult Follow-up  Patient Name: .Jimmy Fisher  MRN: [id number]  DOB: 1980-12-13  Consult Order details:  Orders (From admission, onward)     Start     Ordered   11/18/23 0318  IP CONSULT TO PSYCHIATRY       Ordering Provider: Sundil, Subrina, MD  Provider:  (Not yet assigned)  Question Answer Comment  Location [facility]   Reason for Consult? Major depressive disorder and generalized anxiety disorder.      11/18/23 0317             Mode of Visit: In person    Psychiatry Consult Evaluation  Service Date: November 19, 2023 LOS:  LOS: 1 day  Chief Complaint "I'm here because I want detox."  Primary Psychiatric Diagnoses  Stimulant use d/o 2.  MDD   Assessment  Jimmy Fisher is a 43 y.o. male admitted: Medicallyfor 11/17/2023  5:48 PM for cellulitis. He carries the psychiatric diagnoses of stimulant use d/o and MDD and has a past medical history of cellulitis, fibromyalgia, HIV, HTN, hyperlipidemia.   His presentation of using stimulants daily, inability to stop, needing higher doses to achieve the same effect, withdrawal symptoms when not use, and consequences to his use in the social and employment realms are most consistent with stimulant use d/o. Current outpatient psychotropic medications include Prozac  and historically he has had a positive response to these medications. He was non compliant with medications prior to admission as evidenced by self-admission. On initial examination, patient was drowsy, dozed off frequently.   Please see plan below for detailed recommendations.   Diagnoses:  Active Hospital problems: Principal Problem:   Cellulitis of extremity Active Problems:   Major depressive disorder, recurrent severe without psychotic features (HCC)   Hyperlipidemia   Essential hypertension   Methamphetamine abuse (HCC)   Polysubstance abuse (HCC)   IVDU (intravenous drug user)   Rhabdomyolysis   Alcohol  use   History of HIV infection (HCC)   History of pulmonary embolism   Hypokalemia    Plan   ## Psychiatric Medication Recommendations:  Stimulant use d/o: Gabapentin  100 mg TID increased to 300 mg TID Substance use rehab request when medically cleared  MDD: Restarted Prozac  at 20 mg daily  ## Medical Decision Making Capacity: Not specifically addressed in this encounter  ## Further Work-up:  -- most recent EKG on 11/17/23 had QtC of 552 -- Pertinent labwork reviewed earlier this admission includes: CBC with diff, chem panel, toxicology, EKG   ## Disposition:-- Rehab when medically cleared  ## Behavioral / Environmental: - No specific recommendations at this time.     ## Safety and Observation Level:  - Based on my clinical evaluation, I estimate the patient to be at low risk of self harm in the current setting. - At this time, we recommend  routine. This decision is based on my review of the chart including patient's history and current presentation, interview of the patient, mental status examination, and consideration of suicide risk including evaluating suicidal ideation, plan, intent, suicidal or self-harm behaviors, risk factors, and protective factors. This judgment is based on our ability to directly address suicide risk, implement suicide prevention strategies, and develop a safety plan while the patient is in the clinical setting. Please contact our team if there is a concern that risk level has changed.  CSSR Risk Category: C-SSRS RISK CATEGORY: Low Risk  Suicide Risk Assessment: Patient has following modifiable risk factors for suicide: medication  noncompliance, which we are addressing by restarting his medications. Patient has following non-modifiable or demographic risk factors for suicide: male gender, history of suicide attempt, history of self harm behavior, and psychiatric hospitalization Patient has the following protective factors against suicide: Access to  outpatient mental health care  Thank you for this consult request. Recommendations have been communicated to the primary team.  We will sign off at this time.   Roslynn Coombes, NP       History of Present Illness  Relevant Aspects of [facility] Course:  Admitted on 11/17/2023 for cellulitis related to drug abuse.   Patient Report: Admission to [facility] on 6/5 by Dr. Camillo Celestine: Jimmy Fisher is a 43 y.o. male with a PPHx of MDD, anxiety, AUD, and stimulant use d/o meth type who presents to the [facility] unaccompanied requesting for detox and rehab services. He reports relapsing on alcohol and meth (IV use) 10 days ago and he has been binging ever since.He reports drinking 1/5 of liquor daily. He denies history of withdrawal AVH, seizures, and DT. He reports using meth 0.5 mg for the past 10 days. He reports his last use is 2 days ago. He reports going to [facility] and he told him to get detox prior to rehabilitation.    Patient has been diagnosed with MDD and anxiety and takes Prozac  which he reports he has not been taking during his 10 day substance use binge but prior to 10 days, he was compliant. His medications are managed by his PCP and he has been on Prozac  for years. He currently denies SI, HI, and AVH. He states the last time that he had SI was yesterday. He reports minimal sleep for 5 days due to the drug use. His motivating factors include his life.    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 He is divorces with two children ages 54 and 65. He reports having upcoming court dates but is unsure when.  6/6: Today in the ED, he was drowsy on the assessment with frequent nodding off making the assessment difficult and minimal information obtained.  He reported his depression is "better, a little bit" with no suicidal ideations, distant past suicide attempts, cutting behaviors in self-harm years ago.  High anxiety with excessive worry, panic  attacks on occasion with his last one yesterday with restlessness, shortness of breath, heart racing, and feeling of doom.  Sleep is "horrible", initiation and maintenance which stimulant abuse most likely affected.  Appetite is "good, I'm starving".  He is interested in rehab after medical clearance.  His last rehab was in [place], [place] in April and [place] in January.  11/19/2023: Today, he was resting in his bed with IVs running with a sitter at the bedside.  He denies suicidal ideations and self-harm with "no" and then, "hell no, nothing like that."  Sleep was "alright", lower appetite r/t his current physical issues.  Anxiety continues at a moderate level today, medications adjusted.  He continues to want rehab after he is medically cleared.  TOC consult in place for this.    Psych ROS:  Depression: moderate Anxiety:  high Mania (lifetime and current): none Psychosis: (lifetime and current): none  Review of Systems  Musculoskeletal:        Shoulder pain and injection sites  Psychiatric/Behavioral:  Positive for depression and substance abuse. The patient is nervous/anxious.   All other systems reviewed and are negative.  Psychiatric and Social History  Psychiatric History:  Information collected from patient and chart.  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 He denies history of withdrawal AVH, seizures, and DT. He reports using meth 0.5 mg for the past 10 days. He reports  his last use is 2 days ago.   Exam Findings  Physical Exam: Completed by the MD, reviewed. Vital Signs:  Temp:  [97.8 F (36.6 C)-99.2 F (37.3 C)] 98.5 F (36.9 C) (06/07 0604) Pulse Rate:  [73-85] 79 (06/07 0604) Resp:  [18-22] 18 (06/07 0604) BP: (110-148)/(67-82) 124/82 (06/07 0604) SpO2:  [97 %-99 %] 98 % (06/07 0604) Blood pressure 124/82, pulse 79, temperature 98.5 F (36.9 C), temperature source Oral, resp. rate 18, height 5\' 6"  (1.676 m), weight 81.6 kg, SpO2 98%. Body mass index is 29.05 kg/m.  Physical Exam Vitals and nursing note reviewed.  Constitutional:      Appearance: Normal appearance.  HENT:     Head: Normocephalic.     Nose: Nose normal.  Pulmonary:     Effort: Pulmonary effort is normal.  Musculoskeletal:        General: Normal range of motion.     Cervical back: Normal range of motion.  Neurological:     General: No focal deficit present.     Mental Status: He is alert and oriented to person, place, and time.     Mental Status Exam: General Appearance: Disheveled  Orientation:  Full (Time, Place, and Person)  Memory:  Immediate;   Fair Recent;   Fair Remote;   Fair  Concentration:  Concentration: Poor and Attention Span: Poor  Recall:  Fair  Attention  Poor  Eye Contact:  Fair  Speech:  Slow  Language:  Fair  Volume:  Decreased  Mood: anxious, depressed  Affect:  Appropriate  Thought Process:  Coherent  Thought Content:  Logical  Suicidal Thoughts:  No  Homicidal Thoughts:  No  Judgement:  Fair  Insight:  Fair  Psychomotor Activity:  Decreased  Akathisia:  No  Fund of Knowledge:  Fair      Assets:  Leisure Time Resilience  Cognition:  WNL  ADL's:  Intact  AIMS (if indicated):        Other History   These have been pulled in through the EMR, reviewed, and updated if appropriate.  Family History:  The patient's family history includes Cancer in his father; Fibromyalgia in his mother.  Medical History: Past Medical History:   Diagnosis Date   Anxiety    Depression    DVT (deep venous thrombosis) (HCC)    occurred during hospitalization for overdose   Fibromyalgia    GERD (gastroesophageal reflux disease)    Hiatal hernia    HIV (human immunodeficiency virus infection) (HCC)    Hypercholesteremia    Hypertension    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   Neuromuscular disorder (HCC)    fibromyalgia   Pulmonary embolism (HCC)    2012   SLAP tear of shoulder    left   Urticarial rash 12/05/2022  Vaccine counseling 05/09/2023    Surgical History: Past Surgical History:  Procedure Laterality Date   COLONOSCOPY     I & D EXTREMITY Left 07/16/2020   Procedure: IRRIGATION AND DEBRIDEMENT FOREARM;  Surgeon: Arvil Birks, MD;  [facility];  Service: Orthopedics;  Laterality: Left;   SHOULDER ARTHROSCOPY WITH BICEPS TENDON REPAIR Left 10/22/2020   Procedure: LEFT SHOULDER ARTHROSCOPY, BICEPS TENODESIS;  Surgeon: Wes Hamman, MD;  Location: [facility];  Service: Orthopedics;  Laterality: Left;   SUBACROMIAL DECOMPRESSION Left 10/22/2020   Procedure: SUBACROMIAL DECOMPRESSION;  Surgeon: Wes Hamman, MD;  Location: [facility];  Service: Orthopedics;  Laterality: Left;   UPPER GI ENDOSCOPY       Medications:   Current Facility-Administered Medications:    acetaminophen  (TYLENOL ) tablet 650 mg, 650 mg, Oral, Q6H PRN **OR** acetaminophen  (TYLENOL ) suppository 650 mg, 650 mg, Rectal, Q6H PRN, Sundil, Subrina, MD   bictegravir-emtricitabine -tenofovir  AF (BIKTARVY ) 50-200-25 MG per tablet 1 tablet, 1 tablet, Oral, Daily, Sundil, Subrina, MD, 1 tablet at 11/19/23 1021   ceFEPIme  (MAXIPIME ) 2 g in sodium chloride  0.9 % 100 mL IVPB, 2 g, Intravenous, Q8H, [place], Leellen Puller, RPH, Last Rate: 200 mL/hr at 11/19/23 0741, 2 g at 11/19/23 0741   diphenhydrAMINE  (BENADRYL ) injection 25 mg, 25 mg, Intravenous, Q6H PRN, Sundil, Subrina, MD, 25 mg at 11/18/23 2215   enoxaparin  (LOVENOX )  injection 40 mg, 40 mg, Subcutaneous, Q24H, Sundil, Subrina, MD, 40 mg at 11/19/23 1021   FLUoxetine  (PROZAC ) capsule 20 mg, 20 mg, Oral, Daily, George Kinder, Jamison Y, NP, 20 mg at 11/19/23 1021   folic acid  (FOLVITE ) tablet 1 mg, 1 mg, Oral, Daily, Sundil, Subrina, MD, 1 mg at 11/19/23 1021   gabapentin  (NEURONTIN ) capsule 100 mg, 100 mg, Oral, TID, Lissa Riding, NP, 100 mg at 11/19/23 1021   lactated ringers  infusion, , Intravenous, Continuous, Elisabeth Guild, NP, Last Rate: 125 mL/hr at 11/19/23 0602, New Bag at 11/19/23 0602   LORazepam  (ATIVAN ) tablet 1-4 mg, 1-4 mg, Oral, Q1H PRN, 2 mg at 11/19/23 1025 **OR** LORazepam  (ATIVAN ) injection 1-4 mg, 1-4 mg, Intravenous, Q1H PRN, Sundil, Subrina, MD   multivitamin with minerals tablet 1 tablet, 1 tablet, Oral, Daily, Sundil, Subrina, MD, 1 tablet at 11/18/23 1257   ondansetron  (ZOFRAN ) tablet 4 mg, 4 mg, Oral, Q6H PRN **OR** ondansetron  (ZOFRAN ) injection 4 mg, 4 mg, Intravenous, Q6H PRN, Sundil, Subrina, MD   sodium chloride  flush (NS) 0.9 % injection 3 mL, 3 mL, Intravenous, Q12H, Sundil, Subrina, MD, 3 mL at 11/18/23 2219   sodium chloride  flush (NS) 0.9 % injection 3 mL, 3 mL, Intravenous, Q12H, Sundil, Subrina, MD, 3 mL at 11/18/23 2219   sodium chloride  flush (NS) 0.9 % injection 3 mL, 3 mL, Intravenous, PRN, Sundil, Subrina, MD   thiamine  (VITAMIN B1) tablet 100 mg, 100 mg, Oral, Daily, 100 mg at 11/19/23 1021 **OR** thiamine  (VITAMIN B1) injection 100 mg, 100 mg, Intravenous, Daily, Sundil, Subrina, MD, 100 mg at 11/18/23 0910   vancomycin  (VANCOREADY) IVPB 1250 mg/250 mL, 1,250 mg, Intravenous, Q12H, Wofford, Drew A, RPH, Last Rate: 166.7 mL/hr at 11/19/23 1020, 1,250 mg at 11/19/23 1020  Allergies: Allergies  Allergen Reactions   Doxycycline  Rash    ? Sun exposed rash from doxy vs adverse drug rxn vs completely not related    Roslynn Coombes, NP  I have reviewed the note by NP George Kinder, and discussed the plan of care.  I am in agreement  with the  assessment and plan.  Patient is interested in substance use rehabilitation once he is medically cleared.  He was started on Prozac  for depression and anxiety.  He denies SI, HI, AVH. Psychiatry will sign off.  I have addended the note. While future psychiatric events cannot be accurately predicted, the patient does not currently require acute inpatient psychiatric care and does not currently meet [facility]  involuntary commitment criteria. Please re-consult for any future acute psychiatric concerns.   Mervyn Ace, MD

## 2023-11-20 ENCOUNTER — Inpatient Hospital Stay (HOSPITAL_COMMUNITY)

## 2023-11-20 DIAGNOSIS — F1990 Other psychoactive substance use, unspecified, uncomplicated: Secondary | ICD-10-CM | POA: Diagnosis not present

## 2023-11-20 DIAGNOSIS — L03311 Cellulitis of abdominal wall: Secondary | ICD-10-CM | POA: Diagnosis not present

## 2023-11-20 DIAGNOSIS — L03113 Cellulitis of right upper limb: Secondary | ICD-10-CM | POA: Diagnosis not present

## 2023-11-20 DIAGNOSIS — B20 Human immunodeficiency virus [HIV] disease: Secondary | ICD-10-CM

## 2023-11-20 DIAGNOSIS — L03114 Cellulitis of left upper limb: Secondary | ICD-10-CM

## 2023-11-20 DIAGNOSIS — L02413 Cutaneous abscess of right upper limb: Secondary | ICD-10-CM

## 2023-11-20 DIAGNOSIS — M6282 Rhabdomyolysis: Secondary | ICD-10-CM | POA: Diagnosis not present

## 2023-11-20 DIAGNOSIS — F332 Major depressive disorder, recurrent severe without psychotic features: Secondary | ICD-10-CM | POA: Diagnosis not present

## 2023-11-20 LAB — CBC
HCT: 39.7 % (ref 39.0–52.0)
Hemoglobin: 12.7 g/dL — ABNORMAL LOW (ref 13.0–17.0)
MCH: 30.4 pg (ref 26.0–34.0)
MCHC: 32 g/dL (ref 30.0–36.0)
MCV: 95 fL (ref 80.0–100.0)
Platelets: 254 10*3/uL (ref 150–400)
RBC: 4.18 MIL/uL — ABNORMAL LOW (ref 4.22–5.81)
RDW: 12.9 % (ref 11.5–15.5)
WBC: 7.9 10*3/uL (ref 4.0–10.5)
nRBC: 0 % (ref 0.0–0.2)

## 2023-11-20 LAB — COMPREHENSIVE METABOLIC PANEL WITH GFR
ALT: 35 U/L (ref 0–44)
AST: 25 U/L (ref 15–41)
Albumin: 2.4 g/dL — ABNORMAL LOW (ref 3.5–5.0)
Alkaline Phosphatase: 51 U/L (ref 38–126)
Anion gap: 9 (ref 5–15)
BUN: 10 mg/dL (ref 6–20)
CO2: 21 mmol/L — ABNORMAL LOW (ref 22–32)
Calcium: 8 mg/dL — ABNORMAL LOW (ref 8.9–10.3)
Chloride: 107 mmol/L (ref 98–111)
Creatinine, Ser: 0.86 mg/dL (ref 0.61–1.24)
GFR, Estimated: >60 mL/min (ref >60)
Glucose, Bld: 125 mg/dL — ABNORMAL HIGH (ref 70–99)
Potassium: 3.4 mmol/L — ABNORMAL LOW (ref 3.5–5.1)
Sodium: 137 mmol/L (ref 135–145)
Total Bilirubin: 0.5 mg/dL (ref 0.0–1.2)
Total Protein: 5.3 g/dL — ABNORMAL LOW (ref 6.5–8.1)

## 2023-11-20 MED ORDER — POTASSIUM CHLORIDE 20 MEQ PO PACK
40.0000 meq | PACK | Freq: Once | ORAL | Status: AC
  Administered 2023-11-20: 40 meq via ORAL
  Filled 2023-11-20: qty 2

## 2023-11-20 MED ORDER — PREDNISONE 20 MG PO TABS
20.0000 mg | ORAL_TABLET | Freq: Every day | ORAL | Status: DC
  Administered 2023-11-20 – 2023-11-24 (×5): 20 mg via ORAL
  Filled 2023-11-20 (×5): qty 1

## 2023-11-20 MED ORDER — OXYCODONE HCL 5 MG PO TABS
5.0000 mg | ORAL_TABLET | Freq: Four times a day (QID) | ORAL | Status: DC | PRN
  Administered 2023-11-20 – 2023-11-24 (×12): 5 mg via ORAL
  Filled 2023-11-20 (×13): qty 1

## 2023-11-20 MED ORDER — HYDROXYZINE HCL 25 MG PO TABS
25.0000 mg | ORAL_TABLET | Freq: Three times a day (TID) | ORAL | Status: DC
  Administered 2023-11-20 – 2023-11-24 (×12): 25 mg via ORAL
  Filled 2023-11-20 (×12): qty 1

## 2023-11-20 MED ORDER — LORATADINE 10 MG PO TABS
10.0000 mg | ORAL_TABLET | Freq: Every day | ORAL | Status: DC
  Administered 2023-11-20 – 2023-11-24 (×5): 10 mg via ORAL
  Filled 2023-11-20 (×5): qty 1

## 2023-11-20 MED ORDER — LINEZOLID 600 MG PO TABS
600.0000 mg | ORAL_TABLET | Freq: Two times a day (BID) | ORAL | Status: DC
  Administered 2023-11-20 – 2023-11-24 (×8): 600 mg via ORAL
  Filled 2023-11-20 (×8): qty 1

## 2023-11-20 NOTE — Progress Notes (Signed)
 I was consulted on Mr. Lichtman regarding bilateral arm erythema and swelling. I have also reviewed CT images or both arms. Left arm CT is consistent with an IV infiltration and cellulitis. Right elbow images and CT sugggestive of an abscess. I have spoken with Dr. Glenora Laos, who is kindly willing to proceed with and I and D tomorrow afternoon. Patient has been made NPO and I have discussed plan with Dr. Alfonse Angle. Full consult to follow in the AM.
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
## 2023-11-20 NOTE — Plan of Care (Signed)

## 2023-11-20 NOTE — Progress Notes (Signed)
 Called and discussed with hand surgeon on-call Dr. Jackee Marus, regarding worsening swelling of left hand.  He recommended to get CT of left upper extremity, CT of left forearm, hand and humerus obtained.  He reviewed CT along with hand surgeon, no drainable abscess or urgent surgical need.  Continue IV antibiotics for now.  He recommended to get CT of the right elbow and right forearm to check for drainable abscess.  Will order CT of right elbow and right forearm without contrast.  Will follow the results.

## 2023-11-20 NOTE — Consult Note (Addendum)
 [facility]  Total days of antibiotics 4       Reason for Consult:left arm cellulitis, poorly controlled hiv disease    Referring Physician: lama  Principal Problem:   Cellulitis of extremity Active Problems:   Major depressive disorder, recurrent severe without psychotic features (HCC)   Hyperlipidemia   Essential hypertension   Methamphetamine abuse (HCC)   Polysubstance abuse (HCC)   IVDU (intravenous drug user)   Rhabdomyolysis   Alcohol use   History of HIV infection (HCC)   History of pulmonary embolism   Hypokalemia    HPI: Jimmy Fisher is a 43 y.o. male with poorly controlled hiv disease, HTN, HLD, polymicrobial drug use including cocaine, meth, and ETOH. He is admitted after erythema about all his injection sites including upper and lower extremities.labs not only showed leukocytosis but also elevated CK concern for rhabdomyolysis. He was started on vancomycin  and cefepime . He also disclosed being off of HIV medicaitons for roughly 10 days for the time period he was on a "bender", he reports becoming increasingly paranoid and spent 2 days out in the outdoors/woods without eating and drinking, partially clothed. On admit, he has diffuse rash to abdomen, localized right forearm abscess and left arm swelling and erythema. His left arm is more swollen also by PIV infiltrating.he states his rash is pruritic. He is also being followed by psychiatry during this admission to manage depression/ possible detox.  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   Neuromuscular disorder (HCC)    fibromyalgia   Pulmonary embolism (HCC)    2012    SLAP tear of shoulder    left   Urticarial rash 12/05/2022   Vaccine counseling 05/09/2023    Allergies:  Allergies  Allergen Reactions   Doxycycline  Rash    ? Sun exposed rash from doxy vs adverse drug rxn vs completely not related    Current antibiotics:   MEDICATIONS:  bictegravir-emtricitabine -tenofovir  AF  1 tablet Oral Daily   enoxaparin  (LOVENOX ) injection  40 mg Subcutaneous Q24H   FLUoxetine   20 mg Oral Daily   folic acid   1 mg Oral Daily   gabapentin   300 mg Oral TID   multivitamin with minerals  1 tablet Oral Daily   sodium chloride  flush  3 mL Intravenous Q12H   sodium chloride  flush  3 mL Intravenous Q12H   thiamine   100 mg Oral Daily   Or   thiamine   100 mg Intravenous Daily    Social History   Tobacco Use   Smoking status: Every Day    Types: E-cigarettes    Passive exposure: Past   Smokeless tobacco: Former    Types: Snuff   Tobacco comments:    Vipes- "almost ready to quit" 09/06/23  Vaping Use   Vaping status: Every Day   Substances: Nicotine   Substance Use Topics   Alcohol use: Not Currently    Comment: quit 06/2020   Drug use: Not Currently    Types: Cocaine    Comment: feb 2022    Family History  Problem Relation Age of Onset   Fibromyalgia Mother  Cancer Father     Review of Systems - 12 point ros is negative except for left arm pain and diffuse rash   OBJECTIVE: Temp:  [98 F (36.7 C)-98.7 F (37.1 C)] 98.1 F (36.7 C) (06/08 1308) Pulse Rate:  [75-90] 75 (06/08 1308) Resp:  [16-19] 19 (06/08 1308) BP: (122-163)/(64-84) 122/72 (06/08 1308) SpO2:  [97 %-98 %] 97 % (06/08 1308) Physical Exam  Constitutional: He is oriented to person, place, and time. He appears well-developed and well-nourished. No distress.  HENT:  Mouth/Throat: Oropharynx is clear and moist. No oropharyngeal exudate.  Cardiovascular: Normal rate, regular rhythm and normal heart sounds. Exam reveals no gallop and no friction rub.  No murmur heard.   Pulmonary/Chest: Effort normal and breath sounds normal. No respiratory distress. He has no wheezes.  Abdominal: Soft. Bowel sounds are normal. He exhibits no distension. There is no tenderness.  Lymphadenopathy:  He has no cervical adenopathy.  Neurological: He is alert and oriented to person, place, and time.  Skin: See photos Left arm swelling and erythema   Psychiatric: He has a normal mood and affect. His behavior is normal.    Media Information      Media Information     Media Information    LABS: Results for orders placed or performed during the hospital encounter of 11/17/23 (from the past 48 hours)  CK     Status: None   Collection Time: 11/19/23  7:28 AM  Result Value Ref Range   Total CK 395 49 - 397 U/L    Comment: Performed at [facility], [address], [place], [place] [address]  Comprehensive metabolic panel     Status: Abnormal   Collection Time: 11/19/23  7:28 AM  Result Value Ref Range   Sodium 138 135 - 145 mmol/L   Potassium 3.6 3.5 - 5.1 mmol/L   Chloride 108 98 - 111 mmol/L   CO2 23 22 - 32 mmol/L   Glucose, Bld 98 70 - 99 mg/dL    Comment: Glucose reference range applies only to samples taken after fasting for at least 8 hours.   BUN 11 6 - 20 mg/dL   Creatinine, Ser 6.04 0.61 - 1.24 mg/dL   Calcium  7.9 (L) 8.9 - 10.3 mg/dL   Total Protein 5.3 (L) 6.5 - 8.1 g/dL   Albumin 2.5 (L) 3.5 - 5.0 g/dL   AST 31 15 - 41 U/L   ALT 42 0 - 44 U/L   Alkaline Phosphatase 49 38 - 126 U/L   Total Bilirubin 0.5 0.0 - 1.2 mg/dL   GFR, Estimated >54 >09 mL/min    Comment: (NOTE) Calculated using the CKD-EPI Creatinine Equation (2021)    Anion gap 7 5 - 15    Comment: Performed at [facility], [address] [place], [place] [address]  CBC     Status: Abnormal   Collection Time: 11/20/23  7:11 AM  Result Value Ref Range   WBC 7.9 4.0 - 10.5 K/uL   RBC 4.18 (L) 4.22 - 5.81 MIL/uL   Hemoglobin 12.7 (L) 13.0 - 17.0  g/dL   HCT 47.8 29.5 - 62.1 %   MCV 95.0 80.0 - 100.0 fL   MCH 30.4 26.0 - 34.0 pg   MCHC 32.0 30.0 - 36.0 g/dL   RDW 30.8 65.7 - 84.6 %   Platelets 254 150 - 400 K/uL   nRBC 0.0 0.0 - 0.2 %    Comment: Performed at [employer] [facility], [address] [place], [place] [address]  Comprehensive metabolic panel     Status: Abnormal   Collection Time: 11/20/23  7:11 AM  Result Value Ref Range   Sodium 137 135 - 145 mmol/L   Potassium 3.4 (L) 3.5 - 5.1 mmol/L   Chloride 107 98 - 111 mmol/L   CO2 21 (L) 22 - 32 mmol/L   Glucose, Bld 125 (H) 70 - 99 mg/dL    Comment: Glucose reference range applies only to samples taken after fasting for at least 8 hours.   BUN 10 6 - 20 mg/dL   Creatinine, Ser 5.63 0.61 - 1.24 mg/dL   Calcium  8.0 (L) 8.9 - 10.3 mg/dL   Total Protein 5.3 (L) 6.5 - 8.1 g/dL   Albumin 2.4 (L) 3.5 - 5.0 g/dL   AST 25 15 - 41 U/L   ALT 35 0 - 44 U/L   Alkaline Phosphatase 51 38 - 126 U/L   Total Bilirubin 0.5 0.0 - 1.2 mg/dL   GFR, Estimated >87 >56 mL/min    Comment: (NOTE) Calculated using the CKD-EPI Creatinine Equation (2021)    Anion gap 9 5 - 15    Comment: Performed at [facility], [address] [place], [place] [address]    MICRO:reviewed  IMAGING: ECHOCARDIOGRAM COMPLETE Result Date: 11/19/2023    ECHOCARDIOGRAM REPORT   Patient Name:   Jimmy Fisher Date of Exam: 11/19/2023 Medical Rec #:  [id number]          Height:       66.0 in Accession #:    [id number]         Weight:       180.0 lb Date of Birth:  11-Jul-1980          BSA:          1.912 m Patient Age:    43 years           BP:           124/82 mmHg Patient Gender: M                  HR:           81 bpm. Exam Location:  Inpatient Procedure: 2D Echo, Cardiac Doppler and Color Doppler (Both Spectral and Color            Flow Doppler were utilized during procedure). Indications:    Endocarditis  History:        Patient has no prior history of Echocardiogram examinations.                  Risk Factors:Hypertension and Dyslipidemia.  Sonographer:    Janette Medley Referring Phys: [id number] SUBRINA SUNDIL IMPRESSIONS  1. Left ventricular ejection fraction, by estimation, is 55 to 60%. The left ventricle has normal function. The left ventricle has no regional wall motion abnormalities. Left ventricular diastolic parameters were normal.  2. Right ventricular systolic function is normal. The right ventricular size is normal.  3. The mitral valve is abnormal. Trivial mitral valve regurgitation. No evidence of mitral stenosis.  4. The aortic valve is tricuspid. Aortic valve regurgitation is not visualized. No aortic stenosis is present.  5. The inferior vena cava is normal in size with greater than 50% respiratory variability, suggesting right atrial pressure of 3 mmHg. FINDINGS  Left Ventricle: Left ventricular ejection fraction, by estimation, is 55 to 60%. The left ventricle has normal function. The left ventricle has  no regional wall motion abnormalities. Strain was performed and the global longitudinal strain is indeterminate. The left ventricular internal cavity size was normal in size. There is no left ventricular hypertrophy. Left ventricular diastolic parameters were normal. Right Ventricle: The right ventricular size is normal. No increase in right ventricular wall thickness. Right ventricular systolic function is normal. Left Atrium: Left atrial size was normal in size. Right Atrium: Right atrial size was normal in size. Pericardium: There is no evidence of pericardial effusion. Mitral Valve: The mitral valve is abnormal. There is mild thickening of the mitral valve leaflet(s). Trivial mitral valve regurgitation. No evidence of mitral valve stenosis. Tricuspid Valve: The tricuspid valve is normal in structure. Tricuspid valve regurgitation is trivial. No evidence of tricuspid stenosis. Aortic Valve: The aortic valve is tricuspid. Aortic valve regurgitation is not visualized. No aortic  stenosis is present. Pulmonic Valve: The pulmonic valve was normal in structure. Pulmonic valve regurgitation is trivial. No evidence of pulmonic stenosis. Aorta: The aortic root is normal in size and structure. Venous: The inferior vena cava is normal in size with greater than 50% respiratory variability, suggesting right atrial pressure of 3 mmHg. IAS/Shunts: No atrial level shunt detected by color flow Doppler. Additional Comments: 3D was performed not requiring image post processing on an independent workstation and was indeterminate.  LEFT VENTRICLE PLAX 2D LVIDd:         5.00 cm   Diastology LVIDs:         3.10 cm   LV e' medial:    10.60 cm/s LV PW:         0.80 cm   LV E/e' medial:  8.8 LV IVS:        0.90 cm   LV e' lateral:   12.10 cm/s LVOT diam:     1.90 cm   LV E/e' lateral: 7.7 LV SV:         56 LV SV Index:   29 LVOT Area:     2.84 cm  RIGHT VENTRICLE             IVC RV S prime:     16.50 cm/s  IVC diam: 1.60 cm TAPSE (M-mode): 2.5 cm LEFT ATRIUM             Index        RIGHT ATRIUM           Index LA Vol (A2C):   42.0 ml 21.96 ml/m  RA Area:     13.00 cm LA Vol (A4C):   44.6 ml 23.32 ml/m  RA Volume:   30.90 ml  16.16 ml/m LA Biplane Vol: 43.6 ml 22.80 ml/m  AORTIC VALVE LVOT Vmax:   129.00 cm/s LVOT Vmean:  78.800 cm/s LVOT VTI:    0.198 m  AORTA Ao Root diam: 3.30 cm Ao Asc diam:  3.00 cm MITRAL VALVE MV Area (PHT): 5.02 cm    SHUNTS MV Decel Time: 151 msec    Systemic VTI:  0.20 m MV E velocity: 93.40 cm/s  Systemic Diam: 1.90 cm MV A velocity: 66.00 cm/s MV E/A ratio:  1.42 Janelle Mediate MD Electronically signed by Janelle Mediate MD Signature Date/Time: 11/19/2023/10:00:47 AM    Final     HISTORICAL MICRO/IMAGING  Assessment/Plan:  43yo M with poorly controlled hiv disease, with active polydrug use admitted for cellulitis/soft skin tissue infection and drug detox but also found to have transaminitis, CKemia  Celllulitis =continue with polymicrobial coverage. Await for ct results to see  if any debridement is needed. We will change iv vancomycin  to oral linezolid due to limited PIV access. Can continue with cefepime . If he loses piv, can change cefepime  to levofloxacin  Dermatitis = concern he may have contact dermatitis from poison ivy due to his partial clothed stay in the outdoors. Will start atarax  25mg  TID and can increase to 50mg  TID. Will start certizine. Can start show prednisone  taper starting with 20mg  daily -due to diffuse nature,  Right forearm abscess= continue to monitor to see if it needs ix d  Hiv disease= restart biktarvy . Will check cd 4 count and VL. Recent showed CD 4 count in 500s. We can have him follow up with dr hatcher post hospitalization  Polydrug use, seeking detox = if [facility] to accept, we can devise oral abtx regimen to complete course  Transaminitis = improved suspect for heavy alcohol use  Depression = restarted on prozac  by pnsychiatry.   Gerold Kos Levern Reader MD MPH [facility] [phone]   Elevated CK = now improved

## 2023-11-21 ENCOUNTER — Encounter (HOSPITAL_COMMUNITY): Payer: Self-pay | Admitting: Internal Medicine

## 2023-11-21 ENCOUNTER — Other Ambulatory Visit: Payer: Self-pay

## 2023-11-21 ENCOUNTER — Inpatient Hospital Stay (HOSPITAL_COMMUNITY): Admitting: Anesthesiology

## 2023-11-21 ENCOUNTER — Encounter (HOSPITAL_COMMUNITY)
Admission: EM | Disposition: A | Discharge status: Rehabilitation Facility | Payer: Self-pay | Source: Home / Self Care | Attending: Family Medicine

## 2023-11-21 DIAGNOSIS — L03114 Cellulitis of left upper limb: Secondary | ICD-10-CM

## 2023-11-21 DIAGNOSIS — E785 Hyperlipidemia, unspecified: Secondary | ICD-10-CM

## 2023-11-21 DIAGNOSIS — L03113 Cellulitis of right upper limb: Secondary | ICD-10-CM

## 2023-11-21 DIAGNOSIS — L03311 Cellulitis of abdominal wall: Secondary | ICD-10-CM | POA: Diagnosis not present

## 2023-11-21 DIAGNOSIS — B20 Human immunodeficiency virus [HIV] disease: Secondary | ICD-10-CM | POA: Diagnosis not present

## 2023-11-21 DIAGNOSIS — L309 Dermatitis, unspecified: Secondary | ICD-10-CM

## 2023-11-21 DIAGNOSIS — F1990 Other psychoactive substance use, unspecified, uncomplicated: Secondary | ICD-10-CM | POA: Diagnosis not present

## 2023-11-21 DIAGNOSIS — I10 Essential (primary) hypertension: Secondary | ICD-10-CM

## 2023-11-21 DIAGNOSIS — L02413 Cutaneous abscess of right upper limb: Secondary | ICD-10-CM | POA: Diagnosis not present

## 2023-11-21 DIAGNOSIS — F332 Major depressive disorder, recurrent severe without psychotic features: Secondary | ICD-10-CM | POA: Diagnosis not present

## 2023-11-21 HISTORY — PX: IRRIGATION AND DEBRIDEMENT ELBOW: SHX6886

## 2023-11-21 LAB — CBC
HCT: 38.6 % — ABNORMAL LOW (ref 39.0–52.0)
Hemoglobin: 12.5 g/dL — ABNORMAL LOW (ref 13.0–17.0)
MCH: 30.7 pg (ref 26.0–34.0)
MCHC: 32.4 g/dL (ref 30.0–36.0)
MCV: 94.8 fL (ref 80.0–100.0)
Platelets: 308 10*3/uL (ref 150–400)
RBC: 4.07 MIL/uL — ABNORMAL LOW (ref 4.22–5.81)
RDW: 12.9 % (ref 11.5–15.5)
WBC: 11.5 10*3/uL — ABNORMAL HIGH (ref 4.0–10.5)
nRBC: 0 % (ref 0.0–0.2)

## 2023-11-21 LAB — COMPREHENSIVE METABOLIC PANEL WITH GFR
ALT: 39 U/L (ref 0–44)
AST: 28 U/L (ref 15–41)
Albumin: 2.8 g/dL — ABNORMAL LOW (ref 3.5–5.0)
Alkaline Phosphatase: 54 U/L (ref 38–126)
Anion gap: 9 (ref 5–15)
BUN: 11 mg/dL (ref 6–20)
CO2: 23 mmol/L (ref 22–32)
Calcium: 8.3 mg/dL — ABNORMAL LOW (ref 8.9–10.3)
Chloride: 104 mmol/L (ref 98–111)
Creatinine, Ser: 0.77 mg/dL (ref 0.61–1.24)
GFR, Estimated: >60 mL/min (ref >60)
Glucose, Bld: 131 mg/dL — ABNORMAL HIGH (ref 70–99)
Potassium: 4.2 mmol/L (ref 3.5–5.1)
Sodium: 136 mmol/L (ref 135–145)
Total Bilirubin: 0.6 mg/dL (ref 0.0–1.2)
Total Protein: 6.1 g/dL — ABNORMAL LOW (ref 6.5–8.1)

## 2023-11-21 LAB — RPR: RPR Ser Ql: NONREACTIVE

## 2023-11-21 LAB — HIV-1/2 AB - DIFFERENTIATION
HIV 1 Ab: REACTIVE
HIV 2 Ab: NONREACTIVE

## 2023-11-21 SURGERY — IRRIGATION AND DEBRIDEMENT ELBOW
Anesthesia: General | Laterality: Right

## 2023-11-21 MED ORDER — MEPERIDINE HCL 50 MG/ML IJ SOLN: 6.2500 mg | INTRAMUSCULAR | Status: DC | PRN

## 2023-11-21 MED ORDER — DEXAMETHASONE SODIUM PHOSPHATE 10 MG/ML IJ SOLN
INTRAMUSCULAR | Status: AC
Start: 2023-11-21 — End: ?
  Filled 2023-11-21: qty 1

## 2023-11-21 MED ORDER — MIDAZOLAM HCL 2 MG/2ML IJ SOLN
INTRAMUSCULAR | Status: AC
  Filled 2023-11-21: qty 2

## 2023-11-21 MED ORDER — ORAL CARE MOUTH RINSE: 15.0000 mL | OROMUCOSAL | Status: DC | PRN

## 2023-11-21 MED ORDER — ONDANSETRON HCL 4 MG/2ML IJ SOLN
INTRAMUSCULAR | Status: DC | PRN
Start: 2023-11-21 — End: 2023-11-21
  Administered 2023-11-21: 4 mg via INTRAVENOUS

## 2023-11-21 MED ORDER — DEXMEDETOMIDINE HCL IN NACL 80 MCG/20ML IV SOLN
INTRAVENOUS | Status: DC | PRN
  Administered 2023-11-21 (×5): 4 ug via INTRAVENOUS

## 2023-11-21 MED ORDER — ONDANSETRON HCL 4 MG/2ML IJ SOLN
INTRAMUSCULAR | Status: AC
  Filled 2023-11-21: qty 2

## 2023-11-21 MED ORDER — LACTATED RINGERS IV SOLN: INTRAVENOUS | Status: AC

## 2023-11-21 MED ORDER — PROPOFOL 10 MG/ML IV BOLUS
INTRAVENOUS | Status: DC | PRN
  Administered 2023-11-21: 200 mg via INTRAVENOUS

## 2023-11-21 MED ORDER — PHENYLEPHRINE 80 MCG/ML (10ML) SYRINGE FOR IV PUSH (FOR BLOOD PRESSURE SUPPORT)
PREFILLED_SYRINGE | INTRAVENOUS | Status: AC
  Filled 2023-11-21: qty 10

## 2023-11-21 MED ORDER — CHLORHEXIDINE GLUCONATE 0.12 % MT SOLN
15.0000 mL | Freq: Once | OROMUCOSAL | Status: AC
  Administered 2023-11-21: 15 mL via OROMUCOSAL

## 2023-11-21 MED ORDER — DEXMEDETOMIDINE HCL IN NACL 80 MCG/20ML IV SOLN
INTRAVENOUS | Status: AC
  Filled 2023-11-21: qty 20

## 2023-11-21 MED ORDER — HYDROMORPHONE HCL 1 MG/ML IJ SOLN
INTRAMUSCULAR | Status: AC
  Filled 2023-11-21: qty 2

## 2023-11-21 MED ORDER — AMISULPRIDE (ANTIEMETIC) 5 MG/2ML IV SOLN: 10.0000 mg | Freq: Once | INTRAVENOUS | Status: DC | PRN

## 2023-11-21 MED ORDER — 0.9 % SODIUM CHLORIDE (POUR BTL) OPTIME
TOPICAL | Status: DC | PRN
  Administered 2023-11-21: 1000 mL

## 2023-11-21 MED ORDER — MIDAZOLAM HCL 5 MG/5ML IJ SOLN
INTRAMUSCULAR | Status: DC | PRN
  Administered 2023-11-21: 2 mg via INTRAVENOUS

## 2023-11-21 MED ORDER — OXYCODONE HCL 5 MG PO TABS: 5.0000 mg | ORAL_TABLET | Freq: Once | ORAL | Status: DC | PRN

## 2023-11-21 MED ORDER — LIDOCAINE HCL (PF) 2 % IJ SOLN
INTRAMUSCULAR | Status: AC
  Filled 2023-11-21: qty 5

## 2023-11-21 MED ORDER — LIDOCAINE HCL (CARDIAC) PF 100 MG/5ML IV SOSY
PREFILLED_SYRINGE | INTRAVENOUS | Status: DC | PRN
  Administered 2023-11-21: 80 mg via INTRAVENOUS

## 2023-11-21 MED ORDER — HYDROMORPHONE HCL 1 MG/ML IJ SOLN
0.2500 mg | INTRAMUSCULAR | Status: DC | PRN
  Administered 2023-11-21 (×4): 0.5 mg via INTRAVENOUS

## 2023-11-21 MED ORDER — OXYCODONE HCL 5 MG/5ML PO SOLN: 5.0000 mg | Freq: Once | ORAL | Status: DC | PRN

## 2023-11-21 MED ORDER — PHENYLEPHRINE 80 MCG/ML (10ML) SYRINGE FOR IV PUSH (FOR BLOOD PRESSURE SUPPORT)
PREFILLED_SYRINGE | INTRAVENOUS | Status: DC | PRN
Start: 2023-11-21 — End: 2023-11-21
  Administered 2023-11-21: 160 ug via INTRAVENOUS

## 2023-11-21 MED ORDER — FENTANYL CITRATE (PF) 100 MCG/2ML IJ SOLN
INTRAMUSCULAR | Status: DC | PRN
  Administered 2023-11-21 (×2): 50 ug via INTRAVENOUS

## 2023-11-21 MED ORDER — PROPOFOL 10 MG/ML IV BOLUS
INTRAVENOUS | Status: AC
  Filled 2023-11-21: qty 20

## 2023-11-21 MED ORDER — FENTANYL CITRATE (PF) 100 MCG/2ML IJ SOLN
INTRAMUSCULAR | Status: AC
  Filled 2023-11-21: qty 2

## 2023-11-21 MED ORDER — SODIUM CHLORIDE 0.9 % IV SOLN: 12.5000 mg | INTRAVENOUS | Status: DC | PRN

## 2023-11-21 SURGICAL SUPPLY — 12 items
BAG COUNTER SPONGE SURGICOUNT (BAG) IMPLANT
BNDG GAUZE DERMACEA FLUFF 4 (GAUZE/BANDAGES/DRESSINGS) IMPLANT
COVER SURGICAL LIGHT HANDLE (MISCELLANEOUS) ×1 IMPLANT
ELECT PENCIL ROCKER SW 15FT (MISCELLANEOUS) IMPLANT
ELECT REM PT RETURN 15FT ADLT (MISCELLANEOUS) IMPLANT
GAUZE PACKING IODOFORM 1/4X15 (PACKING) IMPLANT
GAUZE PACKING IODOFORM 2INX5YD (GAUZE/BANDAGES/DRESSINGS) IMPLANT
GAUZE SPONGE 4X4 12PLY STRL (GAUZE/BANDAGES/DRESSINGS) IMPLANT
GLOVE BIO SURGEON STRL SZ7 (GLOVE) ×2 IMPLANT
PENCIL SMOKE EVACUATOR (MISCELLANEOUS) IMPLANT
PROTECTOR NERVE ULNAR (MISCELLANEOUS) ×1 IMPLANT
SWAB CULTURE ESWAB REG 1ML (MISCELLANEOUS) IMPLANT

## 2023-11-21 NOTE — Anesthesia Preprocedure Evaluation (Signed)
 Anesthesia Evaluation  Patient identified by MRN, date of birth, ID band Patient awake    Reviewed: Allergy & Precautions, NPO status , Patient's Chart, lab work & pertinent test results  Airway Mallampati: I  TM Distance: >3 FB Neck ROM: Full    Dental no notable dental hx. (+) Teeth Intact, Dental Advisory Given   Pulmonary Current Smoker and Patient abstained from smoking., PE   Pulmonary exam normal breath sounds clear to auscultation       Cardiovascular hypertension, Pt. on medications + DVT  Normal cardiovascular exam Rhythm:Regular Rate:Normal     Neuro/Psych  PSYCHIATRIC DISORDERS Anxiety Depression    negative neurological ROS     GI/Hepatic hiatal hernia,GERD  Controlled,,(+)     substance abuse  cocaine use  Endo/Other  negative endocrine ROS    Renal/GU negative Renal ROS  negative genitourinary   Musculoskeletal  (+) Arthritis ,  Fibromyalgia -, narcotic dependent  Abdominal   Peds  Hematology  (+) HIV  Anesthesia Other Findings   Reproductive/Obstetrics                             Anesthesia Physical Anesthesia Plan  ASA: III  Anesthesia Plan: General   Post-op Pain Management: Gabapentin  PO (pre-op)*   Induction: Intravenous  PONV Risk Score and Plan: 1 and Ondansetron  and Treatment may vary due to age or medical condition  Airway Management Planned: LMA  Additional Equipment:   Intra-op Plan:   Post-operative Plan: Extubation in OR  Informed Consent: I have reviewed the patients History and Physical, chart, labs and discussed the procedure including the risks, benefits and alternatives for the proposed anesthesia with the patient or authorized representative who has indicated his/her understanding and acceptance.     Dental advisory given  Plan Discussed with: CRNA  Anesthesia Plan Comments:         Anesthesia Quick Evaluation

## 2023-11-21 NOTE — H&P (Signed)
 Jimmy Fisher is an 43 y.o. male.  HPI: Patient is a 43 year old male who was admitted 3 days ago.  I was consulted yesterday, due to redness and swelling throughout his bilateral upper extremities.  He has been on the hospitalist service, and infectious diseases following along as well.  He is currently on antibiotics, at the recommendation of Dr. Levern Reader.  He has had a CAT scan of both his left and right upper extremities.  Specifically, there is a focal area of tenderness and fluctuation at the lateral aspect of the patient's right elbow.  The patient's left upper extremity is noted to have diffuse erythema.  Per the patient, the redness has been improving on both the right and left sides over the course of the last 24 hours.  His pain has improved since his admission.  He describes the pain in the region of his right elbow as moderate.  The pain is described as an aching sensation.       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   Neuromuscular disorder (HCC)      fibromyalgia   Pulmonary embolism (HCC)      2012   SLAP tear of shoulder      left   Urticarial rash 12/05/2022   Vaccine counseling 05/09/2023               Past Surgical History:  Procedure Laterality Date   COLONOSCOPY       I & D EXTREMITY Left 07/16/2020    Procedure: IRRIGATION AND DEBRIDEMENT FOREARM;  Surgeon: Arvil Birks, MD;  Location: [facility] OR;  Service: Orthopedics;  Laterality: Left;   SHOULDER ARTHROSCOPY WITH BICEPS TENDON REPAIR Left 10/22/2020    Procedure: LEFT SHOULDER ARTHROSCOPY, BICEPS TENODESIS;  Surgeon: Wes Hamman, MD;  Location: [facility];  Service:  Orthopedics;  Laterality: Left;   SUBACROMIAL DECOMPRESSION Left 10/22/2020    Procedure: SUBACROMIAL DECOMPRESSION;  Surgeon: Wes Hamman, MD;  Location: [facility];  Service: Orthopedics;  Laterality: Left;   UPPER GI ENDOSCOPY                   Family History  Problem Relation Age of Onset   Fibromyalgia Mother     Cancer Father            Social History:  reports that he has been smoking e-cigarettes. He has been exposed to tobacco smoke. He has quit using smokeless tobacco.  His smokeless tobacco use included snuff. He reports that he does not currently use alcohol. He reports that he does not currently use drugs after having used the following drugs: Cocaine.   Allergies:  Allergies       Allergies  Allergen Reactions   Doxycycline  Rash      ? Sun exposed rash from doxy vs adverse drug rxn vs completely not related        Medications: I have reviewed the patient's current medications.   Lab Results Last 48 Hours        Results for orders placed or performed  during the hospital encounter of 11/17/23 (from the past 48 hours)  CBC     Status: Abnormal    Collection Time: 11/20/23  7:11 AM  Result Value Ref Range    WBC 7.9 4.0 - 10.5 K/uL    RBC 4.18 (L) 4.22 - 5.81 MIL/uL    Hemoglobin 12.7 (L) 13.0 - 17.0 g/dL    HCT 78.4 69.6 - 29.5 %    MCV 95.0 80.0 - 100.0 fL    MCH 30.4 26.0 - 34.0 pg    MCHC 32.0 30.0 - 36.0 g/dL    RDW 28.4 13.2 - 44.0 %    Platelets 254 150 - 400 K/uL    nRBC 0.0 0.0 - 0.2 %      Comment: Performed at [facility], [address], [place], [place] [address]  Comprehensive metabolic panel     Status: Abnormal    Collection Time: 11/20/23  7:11 AM  Result Value Ref Range    Sodium 137 135 - 145 mmol/L    Potassium 3.4 (L) 3.5 - 5.1 mmol/L    Chloride 107 98 - 111 mmol/L    CO2 21 (L) 22 - 32 mmol/L    Glucose, Bld 125 (H) 70 - 99 mg/dL      Comment: Glucose reference range applies only to samples  taken after fasting for at least 8 hours.    BUN 10 6 - 20 mg/dL    Creatinine, Ser 5.36 0.61 - 1.24 mg/dL    Calcium  8.0 (L) 8.9 - 10.3 mg/dL    Total Protein 5.3 (L) 6.5 - 8.1 g/dL    Albumin 2.4 (L) 3.5 - 5.0 g/dL    AST 25 15 - 41 U/L    ALT 35 0 - 44 U/L    Alkaline Phosphatase 51 38 - 126 U/L    Total Bilirubin 0.5 0.0 - 1.2 mg/dL    GFR, Estimated >64 >40 mL/min      Comment: (NOTE) Calculated using the CKD-EPI Creatinine Equation (2021)      Anion gap 9 5 - 15      Comment: Performed at [facility], [address], [place], [place] [address]  CBC     Status: Abnormal    Collection Time: 11/21/23  5:37 AM  Result Value Ref Range    WBC 11.5 (H) 4.0 - 10.5 K/uL    RBC 4.07 (L) 4.22 - 5.81 MIL/uL    Hemoglobin 12.5 (L) 13.0 - 17.0 g/dL    HCT 59.5 (L) 63.8 - 52.0 %    MCV 94.8 80.0 - 100.0 fL    MCH 30.7 26.0 - 34.0 pg    MCHC 32.4 30.0 - 36.0 g/dL    RDW 75.6 43.3 - 29.5 %    Platelets 308 150 - 400 K/uL    nRBC 0.0 0.0 - 0.2 %      Comment: Performed at [facility], [address] [place], [place] [address]  Comprehensive metabolic panel     Status: Abnormal    Collection Time: 11/21/23  5:37 AM  Result Value Ref Range    Sodium 136 135 - 145 mmol/L    Potassium 4.2 3.5 - 5.1 mmol/L    Chloride 104 98 - 111 mmol/L    CO2 23 22 - 32 mmol/L    Glucose, Bld 131 (H) 70 - 99 mg/dL      Comment: Glucose reference range applies only to samples taken after  fasting for at least 8 hours.    BUN 11 6 - 20 mg/dL    Creatinine, Ser 8.11 0.61 - 1.24 mg/dL    Calcium  8.3 (L) 8.9 - 10.3 mg/dL    Total Protein 6.1 (L) 6.5 - 8.1 g/dL    Albumin 2.8 (L) 3.5 - 5.0 g/dL    AST 28 15 - 41 U/L    ALT 39 0 - 44 U/L    Alkaline Phosphatase 54 38 - 126 U/L    Total Bilirubin 0.6 0.0 - 1.2 mg/dL    GFR, Estimated >91 >47 mL/min      Comment: (NOTE) Calculated using the CKD-EPI Creatinine Equation (2021)      Anion gap 9 5 - 15      Comment:  Performed at [facility], [address], [place], [place] [address]         Imaging Results (Last 48 hours)  CT FOREARM RIGHT WO CONTRAST Result Date: 11/21/2023 CLINICAL DATA:  Soft tissue mass/infection/cellulitis EXAM: CT OF THE RIGHT FOREARM WITHOUT CONTRAST TECHNIQUE: Multidetector CT imaging was performed according to the standard protocol. Multiplanar CT image reconstructions were also generated. RADIATION DOSE REDUCTION: This exam was performed according to the departmental dose-optimization program which includes automated exposure control, adjustment of the mA and/or kV according to patient size and/or use of iterative reconstruction technique. COMPARISON:  None Available. FINDINGS: Bones/Joint/Cartilage Mild spurring at the elbow. No bony destructive findings. No fracture observed. Ligaments Suboptimally assessed by CT. Muscles and Tendons Unremarkable Soft tissues Subcutaneous edema medially and laterally at the elbow and tracking anteromedially in the forearm. No abnormal gas in the soft tissues. No drainable abscess identified. IMPRESSION: 1. Subcutaneous edema medially and laterally at the elbow and tracking anteromedially in the forearm. No abnormal gas in the soft tissues. No drainable abscess identified. 2. Mild spurring at the elbow. Electronically Signed   By: Freida Jes M.D.   On: 11/21/2023 09:02    CT ELBOW RIGHT WO CONTRAST Result Date: 11/21/2023 CLINICAL DATA:  Elbow joint effusion EXAM: CT OF THE UPPER RIGHT EXTREMITY WITHOUT CONTRAST TECHNIQUE: Multidetector CT imaging of the upper right extremity was performed according to the standard protocol. RADIATION DOSE REDUCTION: This exam was performed according to the departmental dose-optimization program which includes automated exposure control, adjustment of the mA and/or kV according to patient size and/or use of iterative reconstruction technique. COMPARISON:  Forearm CT 11/20/2023 at 2:26 p.m.  FINDINGS: Bones/Joint/Cartilage No bony destructive findings. Mild spurring of the coronoid process and olecranon of the ulna. No appreciable fracture. Minimal spurring of the radial head with a 2 mm in diameter osteochondral lesion of the medial radial head articular margin on image 63 series 11. No elbow joint effusion identified. Ligaments Suboptimally assessed by CT. Muscles and Tendons Grossly unremarkable Soft tissues Subcutaneous edema along the volar-radial side of the elbow as on image 82 series 13. Subcutaneous edema tracks posteriorly and medially along the elbow and extends around to the anteromedial proximal forearm. No drainable abscess. No gas in the soft tissues identified. There appears to be IV tubing in a small subcutaneous vein in the distal upper arm. IMPRESSION: 1. Subcutaneous edema along the volar-radial side of the elbow and tracking posteriorly and medially along the elbow and extends around to the anteromedial proximal forearm. No drainable abscess. No gas in the soft tissues identified. 2. No elbow joint effusion identified. 3. Mild spurring of the coronoid process and olecranon of the ulna. Minimal spurring of  the radial head with a 2 mm in diameter osteochondral lesion of the medial radial head articular margin. Electronically Signed   By: Freida Jes M.D.   On: 11/21/2023 08:59    CT HUMERUS LEFT WO CONTRAST Result Date: 11/20/2023 CLINICAL DATA:  Soft tissue mass of the upper arm and forearm, deep. Hand pain and swelling. Left arm cellulitis with leukocytosis and elevated CK levels. History of HIV and poly substance abuse. History of left shoulder subacromial decompression, labral debridement and biceps tenodesis. EXAM: CT OF THE UPPER LEFT EXTREMITY WITHOUT CONTRAST TECHNIQUE: Multidetector CT imaging of the upper left extremity was performed according to the standard protocol. Separate acquisitions of the left upper arm, left forearm and left hand were obtained. RADIATION  DOSE REDUCTION: This exam was performed according to the departmental dose-optimization program which includes automated exposure control, adjustment of the mA and/or kV according to patient size and/or use of iterative reconstruction technique. COMPARISON:  Left shoulder MRI 02/27/2023. FINDINGS: Bones/Joint/Cartilage No evidence of acute fracture, dislocation or osteomyelitis within the left upper arm, left forearm or left hand. There are postsurgical changes in the humeral head consistent with bicipital tenodesis. There is a possible small loose body within the superior subscapularis recess, but no significant shoulder joint effusion. No significant effusion or arthropathy at the elbow. Possible small nonspecific radiocarpal joint effusion. No significant arthropathic changes identified in left wrist or hand. Ligaments Suboptimally assessed by CT. Muscles and Tendons As above, postsurgical changes at the left shoulder consistent with previous bicipital tenodesis. No intramuscular fluid collection, foreign body or intramuscular soft tissue emphysema identified within the left upper arm or forearm. No significant focal muscular atrophy. As evaluated by CT, the biceps and triceps tendons are intact at the elbow. Soft tissues There is an intravenous line medially within the antecubital fossa. There is prominent soft tissue emphysema within the subcutaneous fat adjacent to the tip of the IV line, with posterior extension along the fascial surface of the medial flexor musculature in the proximal forearm. This is more than expected from the line and could reflect gas-forming soft tissue infection. No other soft tissue emphysema or unexpected foreign bodies identified. There is generalized subcutaneous edema throughout the upper arm, greatest posteriorly and medially. This edema extends into the forearm, also greatest posteriorly. There is also moderate subcutaneous edema within the dorsal aspect of the wrist and hand  pain. There is a possible area of exophytic skin blistering in the anterior aspect of the proximal forearm, near the IV tubing. IMPRESSION: 1. Generalized subcutaneous edema throughout the left upper arm, forearm and hand, consistent with cellulitis. Possible area of exophytic skin blistering in the anterior aspect of the proximal forearm, near the IV tubing. 2. Prominent subcutaneous emphysema medially at the elbow adjacent to the tip of the IV line, with posterior extension along the fascial surface of the medial flexor musculature in the proximal forearm. This is more than expected from the IV line and could reflect gas-forming soft tissue infection. 3. No evidence of deep soft tissue infection, abscess or unexpected foreign body. 4. No evidence of osteomyelitis or septic arthritis. 5. Postsurgical changes at the left shoulder consistent with previous bicipital tenodesis. 6. These results will be called to the ordering clinician or representative by the Radiologist Assistant, and communication documented in the PACS or [employer]. Electronically Signed   By: Elmon Hagedorn M.D.   On: 11/20/2023 16:14    CT FOREARM LEFT WO CONTRAST Result Date: 11/20/2023 CLINICAL DATA:  Soft  tissue mass of the upper arm and forearm, deep. Hand pain and swelling. Left arm cellulitis with leukocytosis and elevated CK levels. History of HIV and poly substance abuse. History of left shoulder subacromial decompression, labral debridement and biceps tenodesis. EXAM: CT OF THE UPPER LEFT EXTREMITY WITHOUT CONTRAST TECHNIQUE: Multidetector CT imaging of the upper left extremity was performed according to the standard protocol. Separate acquisitions of the left upper arm, left forearm and left hand were obtained. RADIATION DOSE REDUCTION: This exam was performed according to the departmental dose-optimization program which includes automated exposure control, adjustment of the mA and/or kV according to patient size and/or use of  iterative reconstruction technique. COMPARISON:  Left shoulder MRI 02/27/2023. FINDINGS: Bones/Joint/Cartilage No evidence of acute fracture, dislocation or osteomyelitis within the left upper arm, left forearm or left hand. There are postsurgical changes in the humeral head consistent with bicipital tenodesis. There is a possible small loose body within the superior subscapularis recess, but no significant shoulder joint effusion. No significant effusion or arthropathy at the elbow. Possible small nonspecific radiocarpal joint effusion. No significant arthropathic changes identified in left wrist or hand. Ligaments Suboptimally assessed by CT. Muscles and Tendons As above, postsurgical changes at the left shoulder consistent with previous bicipital tenodesis. No intramuscular fluid collection, foreign body or intramuscular soft tissue emphysema identified within the left upper arm or forearm. No significant focal muscular atrophy. As evaluated by CT, the biceps and triceps tendons are intact at the elbow. Soft tissues There is an intravenous line medially within the antecubital fossa. There is prominent soft tissue emphysema within the subcutaneous fat adjacent to the tip of the IV line, with posterior extension along the fascial surface of the medial flexor musculature in the proximal forearm. This is more than expected from the line and could reflect gas-forming soft tissue infection. No other soft tissue emphysema or unexpected foreign bodies identified. There is generalized subcutaneous edema throughout the upper arm, greatest posteriorly and medially. This edema extends into the forearm, also greatest posteriorly. There is also moderate subcutaneous edema within the dorsal aspect of the wrist and hand pain. There is a possible area of exophytic skin blistering in the anterior aspect of the proximal forearm, near the IV tubing. IMPRESSION: 1. Generalized subcutaneous edema throughout the left upper arm, forearm  and hand, consistent with cellulitis. Possible area of exophytic skin blistering in the anterior aspect of the proximal forearm, near the IV tubing. 2. Prominent subcutaneous emphysema medially at the elbow adjacent to the tip of the IV line, with posterior extension along the fascial surface of the medial flexor musculature in the proximal forearm. This is more than expected from the IV line and could reflect gas-forming soft tissue infection. 3. No evidence of deep soft tissue infection, abscess or unexpected foreign body. 4. No evidence of osteomyelitis or septic arthritis. 5. Postsurgical changes at the left shoulder consistent with previous bicipital tenodesis. 6. These results will be called to the ordering clinician or representative by the Radiologist Assistant, and communication documented in the PACS or [employer]. Electronically Signed   By: Elmon Hagedorn M.D.   On: 11/20/2023 16:14    CT HAND LEFT WO CONTRAST Result Date: 11/20/2023 CLINICAL DATA:  Soft tissue mass of the upper arm and forearm, deep. Hand pain and swelling. Left arm cellulitis with leukocytosis and elevated CK levels. History of HIV and poly substance abuse. History of left shoulder subacromial decompression, labral debridement and biceps tenodesis. EXAM: CT OF THE UPPER LEFT EXTREMITY  WITHOUT CONTRAST TECHNIQUE: Multidetector CT imaging of the upper left extremity was performed according to the standard protocol. Separate acquisitions of the left upper arm, left forearm and left hand were obtained. RADIATION DOSE REDUCTION: This exam was performed according to the departmental dose-optimization program which includes automated exposure control, adjustment of the mA and/or kV according to patient size and/or use of iterative reconstruction technique. COMPARISON:  Left shoulder MRI 02/27/2023. FINDINGS: Bones/Joint/Cartilage No evidence of acute fracture, dislocation or osteomyelitis within the left upper arm, left forearm or  left hand. There are postsurgical changes in the humeral head consistent with bicipital tenodesis. There is a possible small loose body within the superior subscapularis recess, but no significant shoulder joint effusion. No significant effusion or arthropathy at the elbow. Possible small nonspecific radiocarpal joint effusion. No significant arthropathic changes identified in left wrist or hand. Ligaments Suboptimally assessed by CT. Muscles and Tendons As above, postsurgical changes at the left shoulder consistent with previous bicipital tenodesis. No intramuscular fluid collection, foreign body or intramuscular soft tissue emphysema identified within the left upper arm or forearm. No significant focal muscular atrophy. As evaluated by CT, the biceps and triceps tendons are intact at the elbow. Soft tissues There is an intravenous line medially within the antecubital fossa. There is prominent soft tissue emphysema within the subcutaneous fat adjacent to the tip of the IV line, with posterior extension along the fascial surface of the medial flexor musculature in the proximal forearm. This is more than expected from the line and could reflect gas-forming soft tissue infection. No other soft tissue emphysema or unexpected foreign bodies identified. There is generalized subcutaneous edema throughout the upper arm, greatest posteriorly and medially. This edema extends into the forearm, also greatest posteriorly. There is also moderate subcutaneous edema within the dorsal aspect of the wrist and hand pain. There is a possible area of exophytic skin blistering in the anterior aspect of the proximal forearm, near the IV tubing. IMPRESSION: 1. Generalized subcutaneous edema throughout the left upper arm, forearm and hand, consistent with cellulitis. Possible area of exophytic skin blistering in the anterior aspect of the proximal forearm, near the IV tubing. 2. Prominent subcutaneous emphysema medially at the elbow  adjacent to the tip of the IV line, with posterior extension along the fascial surface of the medial flexor musculature in the proximal forearm. This is more than expected from the IV line and could reflect gas-forming soft tissue infection. 3. No evidence of deep soft tissue infection, abscess or unexpected foreign body. 4. No evidence of osteomyelitis or septic arthritis. 5. Postsurgical changes at the left shoulder consistent with previous bicipital tenodesis. 6. These results will be called to the ordering clinician or representative by the Radiologist Assistant, and communication documented in the PACS or [employer]. Electronically Signed   By: Elmon Hagedorn M.D.   On: 11/20/2023 16:14       Review of Systems  Constitutional: Negative.   HENT: Negative.    Eyes: Negative.   Respiratory: Negative.    Gastrointestinal: Negative.   Genitourinary: Negative.   Neurological: Negative.     Blood pressure 139/88, pulse 72, temperature 97.8 F (36.6 C), temperature source Oral, resp. rate 11, height 5\' 6"  (1.676 m), weight 81.6 kg, SpO2 98%. Physical Exam Constitutional:      Appearance: Normal appearance.  HENT:     Head: Normocephalic.     Nose: Nose normal.     Mouth/Throat:     Mouth: Mucous membranes are dry.  Eyes:     Extraocular Movements: Extraocular movements intact.     Pupils: Pupils are equal, round, and reactive to light.  Abdominal:     General: Abdomen is flat.     Palpations: Abdomen is soft.  Musculoskeletal:     Cervical back: Normal range of motion and neck supple.     Comments: At the lateral aspect of the patient's right elbow, there is an isolated area of fluctuance, consistent with an abscess.  It is soft to touch.  This region is tender.   Left upper extremity examination is consistent with cellulitis.  Skin:    General: Skin is dry.     Capillary Refill: Capillary refill takes less than 2 seconds.  Neurological:     General: No focal deficit present.      Mental Status: He is alert.        Assessment/Plan: The patient's left upper extremity is consistent with cellulitis.  With regards to his right upper extremity, there is a very focal area of fluctuance.  Clinically, this appears consistent with an abscess.  It is soft and easily compressible.  I did discuss with the patient that without surgical intervention, this is likely to persist.  I did discuss with the patient the benefits of an irrigation and debridement procedure.  He does understand the risks of neurovascular injury, bleeding, and recurrence of what is likely to be an infection.  He does wish to proceed.  He is currently NPO.  He will be maintained n.p.o. until surgery at approximately 430 or 5 PM today.  Additional management of his cellulitis and possible poison ivy will be per Dr. Levern Reader and infectious disease.     Breauna Mazzeo L Vernona Peake

## 2023-11-21 NOTE — Consult Note (Signed)
 Reason for Consult: Bilateral arm redness and swelling Referring Physician: Dr. Amye Baller is an 43 y.o. male.  HPI: Patient is a 43 year old male who was admitted 3 days ago.  I was consulted yesterday, due to redness and swelling throughout his bilateral upper extremities.  He has been on the hospitalist service, and infectious diseases following along as well.  He is currently on antibiotics, at the recommendation of Dr. Levern Reader.  He has had a CAT scan of both his left and right upper extremities.  Specifically, there is a focal area of tenderness and fluctuation at the lateral aspect of the patient's right elbow.  The patient's left upper extremity is noted to have diffuse erythema.  Per the patient, the redness has been improving on both the right and left sides over the course of the last 24 hours.  His pain has improved since his admission.  He describes the pain in the region of his right elbow as moderate.  The pain is described as an aching sensation.  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   Neuromuscular disorder (HCC)    fibromyalgia   Pulmonary embolism (HCC)    2012   SLAP tear of shoulder    left   Urticarial rash 12/05/2022   Vaccine counseling 05/09/2023    Past Surgical History:  Procedure Laterality Date   COLONOSCOPY     I & D EXTREMITY Left 07/16/2020   Procedure: IRRIGATION AND DEBRIDEMENT FOREARM;  Surgeon: Arvil Birks, MD;  Location: [facility] OR;  Service: Orthopedics;  Laterality: Left;   SHOULDER ARTHROSCOPY WITH BICEPS TENDON REPAIR Left 10/22/2020   Procedure: LEFT SHOULDER ARTHROSCOPY, BICEPS TENODESIS;  Surgeon: Wes Hamman, MD;   Location:  SUR[facility]ERY [facility];  Service: Orthopedics;  Laterality: Left;   SUBACROMIAL DECOMPRESSION Left 10/22/2020   Procedure: SUBACROMIAL DECOMPRESSION;  Surgeon: Wes Hamman, MD;  Location:  SURGERY CENTER;  Service: Orthopedics;  Laterality: Left;   UPPER GI ENDOSCOPY      Family History  Problem Relation Age of Onset   Fibromyalgia Mother    Cancer Father     Social History:  reports that he has been smoking e-cigarettes. He has been exposed to tobacco smoke. He has quit using smokeless tobacco.  His smokeless tobacco use included snuff. He reports that he does not currently use alcohol. He reports that he does not currently use drugs after having used the following drugs: Cocaine.  Allergies:  Allergies  Allergen Reactions   Doxycycline  Rash    ? Sun exposed rash from doxy vs adverse drug rxn vs completely not related    Medications: I have reviewed the patient's current medications.  Results for orders placed or performed during the hospital encounter of 11/17/23 (from the past 48 hours)  CBC     Status: Abnormal   Collection Time: 11/20/23  7:11 AM  Result Value Ref Range   WBC 7.9 4.0 - 10.5 K/uL   RBC 4.18 (L) 4.22 - 5.81 MIL/uL   Hemoglobin 12.7 (L) 13.0 - 17.0 g/dL   HCT 78.2 95.6 - 21.3 %   MCV 95.0 80.0 -  100.0 fL   MCH 30.4 26.0 - 34.0 pg   MCHC 32.0 30.0 - 36.0 g/dL   RDW 16.1 09.6 - 04.5 %   Platelets 254 150 - 400 K/uL   nRBC 0.0 0.0 - 0.2 %    Comment: Performed at [facility], [address], [place], [place] [address]  Comprehensive metabolic panel     Status: Abnormal   Collection Time: 11/20/23  7:11 AM  Result Value Ref Range   Sodium 137 135 - 145 mmol/L   Potassium 3.4 (L) 3.5 - 5.1 mmol/L   Chloride 107 98 - 111 mmol/L   CO2 21 (L) 22 - 32 mmol/L   Glucose, Bld 125 (H) 70 - 99 mg/dL    Comment: Glucose reference range applies only to samples taken after fasting for at least 8 hours.   BUN 10 6 - 20 mg/dL    Creatinine, Ser 1.91 0.61 - 1.24 mg/dL   Calcium  8.0 (L) 8.9 - 10.3 mg/dL   Total Protein 5.3 (L) 6.5 - 8.1 g/dL   Albumin 2.4 (L) 3.5 - 5.0 g/dL   AST 25 15 - 41 U/L   ALT 35 0 - 44 U/L   Alkaline Phosphatase 51 38 - 126 U/L   Total Bilirubin 0.5 0.0 - 1.2 mg/dL   GFR, Estimated >47 >82 mL/min    Comment: (NOTE) Calculated using the CKD-EPI Creatinine Equation (2021)    Anion gap 9 5 - 15    Comment: Performed at [facility], [address] [place], [place] [address]  CBC     Status: Abnormal   Collection Time: 11/21/23  5:37 AM  Result Value Ref Range   WBC 11.5 (H) 4.0 - 10.5 K/uL   RBC 4.07 (L) 4.22 - 5.81 MIL/uL   Hemoglobin 12.5 (L) 13.0 - 17.0 g/dL   HCT 30.8 (L) 65.7 - 84.6 %   MCV 94.8 80.0 - 100.0 fL   MCH 30.7 26.0 - 34.0 pg   MCHC 32.4 30.0 - 36.0 g/dL   RDW 96.2 95.2 - 84.1 %   Platelets 308 150 - 400 K/uL   nRBC 0.0 0.0 - 0.2 %    Comment: Performed at [facility], [address], [place], [place] [address]  Comprehensive metabolic panel     Status: Abnormal   Collection Time: 11/21/23  5:37 AM  Result Value Ref Range   Sodium 136 135 - 145 mmol/L   Potassium 4.2 3.5 - 5.1 mmol/L   Chloride 104 98 - 111 mmol/L   CO2 23 22 - 32 mmol/L   Glucose, Bld 131 (H) 70 - 99 mg/dL    Comment: Glucose reference range applies only to samples taken after fasting for at least 8 hours.   BUN 11 6 - 20 mg/dL   Creatinine, Ser 1.02 0.61 - 1.24 mg/dL   Calcium  8.3 (L) 8.9 - 10.3 mg/dL   Total Protein 6.1 (L) 6.5 - 8.1 g/dL   Albumin 2.8 (L) 3.5 - 5.0 g/dL   AST 28 15 - 41 U/L   ALT 39 0 - 44 U/L   Alkaline Phosphatase 54 38 - 126 U/L   Total Bilirubin 0.6 0.0 - 1.2 mg/dL   GFR, Estimated >72 >53 mL/min    Comment: (NOTE) Calculated using the CKD-EPI Creatinine Equation (2021)    Anion gap 9 5 - 15    Comment: Performed at [facility], [address] [place], [place] [address]  CT FOREARM RIGHT WO  CONTRAST Result Date: 11/21/2023 CLINICAL DATA:  Soft tissue mass/infection/cellulitis EXAM: CT OF THE RIGHT FOREARM WITHOUT CONTRAST TECHNIQUE: Multidetector CT imaging was performed according to the standard protocol. Multiplanar CT image reconstructions were also generated. RADIATION DOSE REDUCTION: This exam was performed according to the departmental dose-optimization program which includes automated exposure control, adjustment of the mA and/or kV according to patient size and/or use of iterative reconstruction technique. COMPARISON:  None Available. FINDINGS: Bones/Joint/Cartilage Mild spurring at the elbow. No bony destructive findings. No fracture observed. Ligaments Suboptimally assessed by CT. Muscles and Tendons Unremarkable Soft tissues Subcutaneous edema medially and laterally at the elbow and tracking anteromedially in the forearm. No abnormal gas in the soft tissues. No drainable abscess identified. IMPRESSION: 1. Subcutaneous edema medially and laterally at the elbow and tracking anteromedially in the forearm. No abnormal gas in the soft tissues. No drainable abscess identified. 2. Mild spurring at the elbow. Electronically Signed   By: Freida Jes M.D.   On: 11/21/2023 09:02   CT ELBOW RIGHT WO CONTRAST Result Date: 11/21/2023 CLINICAL DATA:  Elbow joint effusion EXAM: CT OF THE UPPER RIGHT EXTREMITY WITHOUT CONTRAST TECHNIQUE: Multidetector CT imaging of the upper right extremity was performed according to the standard protocol. RADIATION DOSE REDUCTION: This exam was performed according to the departmental dose-optimization program which includes automated exposure control, adjustment of the mA and/or kV according to patient size and/or use of iterative reconstruction technique. COMPARISON:  Forearm CT 11/20/2023 at 2:26 p.m. FINDINGS: Bones/Joint/Cartilage No bony destructive findings. Mild spurring of the coronoid process and olecranon of the ulna. No appreciable fracture. Minimal  spurring of the radial head with a 2 mm in diameter osteochondral lesion of the medial radial head articular margin on image 63 series 11. No elbow joint effusion identified. Ligaments Suboptimally assessed by CT. Muscles and Tendons Grossly unremarkable Soft tissues Subcutaneous edema along the volar-radial side of the elbow as on image 82 series 13. Subcutaneous edema tracks posteriorly and medially along the elbow and extends around to the anteromedial proximal forearm. No drainable abscess. No gas in the soft tissues identified. There appears to be IV tubing in a small subcutaneous vein in the distal upper arm. IMPRESSION: 1. Subcutaneous edema along the volar-radial side of the elbow and tracking posteriorly and medially along the elbow and extends around to the anteromedial proximal forearm. No drainable abscess. No gas in the soft tissues identified. 2. No elbow joint effusion identified. 3. Mild spurring of the coronoid process and olecranon of the ulna. Minimal spurring of the radial head with a 2 mm in diameter osteochondral lesion of the medial radial head articular margin. Electronically Signed   By: Freida Jes M.D.   On: 11/21/2023 08:59   CT HUMERUS LEFT WO CONTRAST Result Date: 11/20/2023 CLINICAL DATA:  Soft tissue mass of the upper arm and forearm, deep. Hand pain and swelling. Left arm cellulitis with leukocytosis and elevated CK levels. History of HIV and poly substance abuse. History of left shoulder subacromial decompression, labral debridement and biceps tenodesis. EXAM: CT OF THE UPPER LEFT EXTREMITY WITHOUT CONTRAST TECHNIQUE: Multidetector CT imaging of the upper left extremity was performed according to the standard protocol. Separate acquisitions of the left upper arm, left forearm and left hand were obtained. RADIATION DOSE REDUCTION: This exam was performed according to the departmental dose-optimization program which includes automated exposure control, adjustment of the mA  and/or kV according to patient size and/or use of iterative reconstruction technique. COMPARISON:  Left shoulder MRI 02/27/2023. FINDINGS: Bones/Joint/Cartilage No evidence of acute fracture, dislocation or osteomyelitis within the left upper arm, left forearm or left hand. There are postsurgical changes in the humeral head consistent with bicipital tenodesis. There is a possible small loose body within the superior subscapularis recess, but no significant shoulder joint effusion. No significant effusion or arthropathy at the elbow. Possible small nonspecific radiocarpal joint effusion. No significant arthropathic changes identified in left wrist or hand. Ligaments Suboptimally assessed by CT. Muscles and Tendons As above, postsurgical changes at the left shoulder consistent with previous bicipital tenodesis. No intramuscular fluid collection, foreign body or intramuscular soft tissue emphysema identified within the left upper arm or forearm. No significant focal muscular atrophy. As evaluated by CT, the biceps and triceps tendons are intact at the elbow. Soft tissues There is an intravenous line medially within the antecubital fossa. There is prominent soft tissue emphysema within the subcutaneous fat adjacent to the tip of the IV line, with posterior extension along the fascial surface of the medial flexor musculature in the proximal forearm. This is more than expected from the line and could reflect gas-forming soft tissue infection. No other soft tissue emphysema or unexpected foreign bodies identified. There is generalized subcutaneous edema throughout the upper arm, greatest posteriorly and medially. This edema extends into the forearm, also greatest posteriorly. There is also moderate subcutaneous edema within the dorsal aspect of the wrist and hand pain. There is a possible area of exophytic skin blistering in the anterior aspect of the proximal forearm, near the IV tubing. IMPRESSION: 1. Generalized  subcutaneous edema throughout the left upper arm, forearm and hand, consistent with cellulitis. Possible area of exophytic skin blistering in the anterior aspect of the proximal forearm, near the IV tubing. 2. Prominent subcutaneous emphysema medially at the elbow adjacent to the tip of the IV line, with posterior extension along the fascial surface of the medial flexor musculature in the proximal forearm. This is more than expected from the IV line and could reflect gas-forming soft tissue infection. 3. No evidence of deep soft tissue infection, abscess or unexpected foreign body. 4. No evidence of osteomyelitis or septic arthritis. 5. Postsurgical changes at the left shoulder consistent with previous bicipital tenodesis. 6. These results will be called to the ordering clinician or representative by the Radiologist Assistant, and communication documented in the PACS or [employer]. Electronically Signed   By: Elmon Hagedorn M.D.   On: 11/20/2023 16:14   CT FOREARM LEFT WO CONTRAST Result Date: 11/20/2023 CLINICAL DATA:  Soft tissue mass of the upper arm and forearm, deep. Hand pain and swelling. Left arm cellulitis with leukocytosis and elevated CK levels. History of HIV and poly substance abuse. History of left shoulder subacromial decompression, labral debridement and biceps tenodesis. EXAM: CT OF THE UPPER LEFT EXTREMITY WITHOUT CONTRAST TECHNIQUE: Multidetector CT imaging of the upper left extremity was performed according to the standard protocol. Separate acquisitions of the left upper arm, left forearm and left hand were obtained. RADIATION DOSE REDUCTION: This exam was performed according to the departmental dose-optimization program which includes automated exposure control, adjustment of the mA and/or kV according to patient size and/or use of iterative reconstruction technique. COMPARISON:  Left shoulder MRI 02/27/2023. FINDINGS: Bones/Joint/Cartilage No evidence of acute fracture, dislocation or  osteomyelitis within the left upper arm, left forearm or left hand. There are postsurgical changes in the humeral head consistent with bicipital tenodesis. There is a possible small loose body within the superior subscapularis recess,  but no significant shoulder joint effusion. No significant effusion or arthropathy at the elbow. Possible small nonspecific radiocarpal joint effusion. No significant arthropathic changes identified in left wrist or hand. Ligaments Suboptimally assessed by CT. Muscles and Tendons As above, postsurgical changes at the left shoulder consistent with previous bicipital tenodesis. No intramuscular fluid collection, foreign body or intramuscular soft tissue emphysema identified within the left upper arm or forearm. No significant focal muscular atrophy. As evaluated by CT, the biceps and triceps tendons are intact at the elbow. Soft tissues There is an intravenous line medially within the antecubital fossa. There is prominent soft tissue emphysema within the subcutaneous fat adjacent to the tip of the IV line, with posterior extension along the fascial surface of the medial flexor musculature in the proximal forearm. This is more than expected from the line and could reflect gas-forming soft tissue infection. No other soft tissue emphysema or unexpected foreign bodies identified. There is generalized subcutaneous edema throughout the upper arm, greatest posteriorly and medially. This edema extends into the forearm, also greatest posteriorly. There is also moderate subcutaneous edema within the dorsal aspect of the wrist and hand pain. There is a possible area of exophytic skin blistering in the anterior aspect of the proximal forearm, near the IV tubing. IMPRESSION: 1. Generalized subcutaneous edema throughout the left upper arm, forearm and hand, consistent with cellulitis. Possible area of exophytic skin blistering in the anterior aspect of the proximal forearm, near the IV tubing. 2.  Prominent subcutaneous emphysema medially at the elbow adjacent to the tip of the IV line, with posterior extension along the fascial surface of the medial flexor musculature in the proximal forearm. This is more than expected from the IV line and could reflect gas-forming soft tissue infection. 3. No evidence of deep soft tissue infection, abscess or unexpected foreign body. 4. No evidence of osteomyelitis or septic arthritis. 5. Postsurgical changes at the left shoulder consistent with previous bicipital tenodesis. 6. These results will be called to the ordering clinician or representative by the Radiologist Assistant, and communication documented in the PACS or [employer]. Electronically Signed   By: Elmon Hagedorn M.D.   On: 11/20/2023 16:14   CT HAND LEFT WO CONTRAST Result Date: 11/20/2023 CLINICAL DATA:  Soft tissue mass of the upper arm and forearm, deep. Hand pain and swelling. Left arm cellulitis with leukocytosis and elevated CK levels. History of HIV and poly substance abuse. History of left shoulder subacromial decompression, labral debridement and biceps tenodesis. EXAM: CT OF THE UPPER LEFT EXTREMITY WITHOUT CONTRAST TECHNIQUE: Multidetector CT imaging of the upper left extremity was performed according to the standard protocol. Separate acquisitions of the left upper arm, left forearm and left hand were obtained. RADIATION DOSE REDUCTION: This exam was performed according to the departmental dose-optimization program which includes automated exposure control, adjustment of the mA and/or kV according to patient size and/or use of iterative reconstruction technique. COMPARISON:  Left shoulder MRI 02/27/2023. FINDINGS: Bones/Joint/Cartilage No evidence of acute fracture, dislocation or osteomyelitis within the left upper arm, left forearm or left hand. There are postsurgical changes in the humeral head consistent with bicipital tenodesis. There is a possible small loose body within the superior  subscapularis recess, but no significant shoulder joint effusion. No significant effusion or arthropathy at the elbow. Possible small nonspecific radiocarpal joint effusion. No significant arthropathic changes identified in left wrist or hand. Ligaments Suboptimally assessed by CT. Muscles and Tendons As above, postsurgical changes at the left shoulder consistent with  previous bicipital tenodesis. No intramuscular fluid collection, foreign body or intramuscular soft tissue emphysema identified within the left upper arm or forearm. No significant focal muscular atrophy. As evaluated by CT, the biceps and triceps tendons are intact at the elbow. Soft tissues There is an intravenous line medially within the antecubital fossa. There is prominent soft tissue emphysema within the subcutaneous fat adjacent to the tip of the IV line, with posterior extension along the fascial surface of the medial flexor musculature in the proximal forearm. This is more than expected from the line and could reflect gas-forming soft tissue infection. No other soft tissue emphysema or unexpected foreign bodies identified. There is generalized subcutaneous edema throughout the upper arm, greatest posteriorly and medially. This edema extends into the forearm, also greatest posteriorly. There is also moderate subcutaneous edema within the dorsal aspect of the wrist and hand pain. There is a possible area of exophytic skin blistering in the anterior aspect of the proximal forearm, near the IV tubing. IMPRESSION: 1. Generalized subcutaneous edema throughout the left upper arm, forearm and hand, consistent with cellulitis. Possible area of exophytic skin blistering in the anterior aspect of the proximal forearm, near the IV tubing. 2. Prominent subcutaneous emphysema medially at the elbow adjacent to the tip of the IV line, with posterior extension along the fascial surface of the medial flexor musculature in the proximal forearm. This is more than  expected from the IV line and could reflect gas-forming soft tissue infection. 3. No evidence of deep soft tissue infection, abscess or unexpected foreign body. 4. No evidence of osteomyelitis or septic arthritis. 5. Postsurgical changes at the left shoulder consistent with previous bicipital tenodesis. 6. These results will be called to the ordering clinician or representative by the Radiologist Assistant, and communication documented in the PACS or [employer]. Electronically Signed   By: Elmon Hagedorn M.D.   On: 11/20/2023 16:14    Review of Systems  Constitutional: Negative.   HENT: Negative.    Eyes: Negative.   Respiratory: Negative.    Gastrointestinal: Negative.   Genitourinary: Negative.   Neurological: Negative.    Blood pressure 139/88, pulse 72, temperature 97.8 F (36.6 C), temperature source Oral, resp. rate 11, height 5\' 6"  (1.676 m), weight 81.6 kg, SpO2 98%. Physical Exam Constitutional:      Appearance: Normal appearance.  HENT:     Head: Normocephalic.     Nose: Nose normal.     Mouth/Throat:     Mouth: Mucous membranes are dry.  Eyes:     Extraocular Movements: Extraocular movements intact.     Pupils: Pupils are equal, round, and reactive to light.  Abdominal:     General: Abdomen is flat.     Palpations: Abdomen is soft.  Musculoskeletal:     Cervical back: Normal range of motion and neck supple.     Comments: At the lateral aspect of the patient's right elbow, there is an isolated area of fluctuance, consistent with an abscess.  It is soft to touch.  This region is tender.  Left upper extremity examination is consistent with cellulitis.  Skin:    General: Skin is dry.     Capillary Refill: Capillary refill takes less than 2 seconds.  Neurological:     General: No focal deficit present.     Mental Status: He is alert.     Assessment/Plan: The patient's left upper extremity is consistent with cellulitis.  With regards to his right upper  extremity, there is a  very focal area of fluctuance.  Clinically, this appears consistent with an abscess.  It is soft and easily compressible.  I did discuss with the patient that without surgical intervention, this is likely to persist.  I did discuss with the patient the benefits of an irrigation and debridement procedure.  He does understand the risks of neurovascular injury, bleeding, and recurrence of what is likely to be an infection.  He does wish to proceed.  He is currently NPO.  He will be maintained n.p.o. until surgery at approximately 430 or 5 PM today.  Additional management of his cellulitis and possible poison ivy will be per Dr. Levern Reader and infectious disease.   Zakir Henner L Rehaan Viloria 11/21/2023, 10:54 AM

## 2023-11-21 NOTE — Plan of Care (Signed)

## 2023-11-21 NOTE — Anesthesia Postprocedure Evaluation (Signed)
 Anesthesia Post Note  Patient: Jimmy Fisher  Procedure(s) Performed: IRRIGATION AND DEBRIDEMENT ELBOW (Right)     Patient location during evaluation: PACU Anesthesia Type: General Level of consciousness: awake and alert Pain management: pain level controlled Vital Signs Assessment: post-procedure vital signs reviewed and stable Respiratory status: spontaneous breathing, nonlabored ventilation and respiratory function stable Cardiovascular status: blood pressure returned to baseline and stable Postop Assessment: no apparent nausea or vomiting Anesthetic complications: no   No notable events documented.  Last Vitals:  Vitals:   11/21/23 1800 11/21/23 1815  BP: (!) 146/72 (!) 154/95  Pulse: 64 77  Resp: 12 19  Temp:    SpO2: 100% 99%    Last Pain:  Vitals:   11/21/23 1800  TempSrc:   PainSc: 8                  Earvin Goldberg
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
## 2023-11-21 NOTE — Progress Notes (Signed)
 [facility]  Date of Admission:  11/17/2023   Total days of inpatient antibiotics 4  Principal Problem:   Cellulitis of extremity Active Problems:   Major depressive disorder, recurrent severe without psychotic features (HCC)   Hyperlipidemia   Essential hypertension   Methamphetamine abuse (HCC)   Polysubstance abuse (HCC)   IVDU (intravenous drug user)   Rhabdomyolysis   Alcohol use   History of HIV infection (HCC)   History of pulmonary embolism   Hypokalemia          Assessment: 43-year-old male with history of polysubstance abuse,  HIV, hypertension hyperlipidemia admitted due to erythema all over.  On arrival he had leukocytosis and concern for rhabdomyolysis per elevated CK.  Patient [place] the woods [place] the past 2 days on a bender found to have: #Cellulitis in the setting of dermatitis #Right forearm abscess - Patient on antibiotics - Steroids were started as well, rash is improving.  On Atarax  #HIV - Biktarvy  restarted.  Viral load nondetectable on 09/07/2023 - Viral load and CD4 pending #Polysubstance abuse - Acute hep panel negative #Transaminitis - Resolved Recommendations: -continue antibiotics with linezolid and cefepime  - Continue steroids - I&D planned for today, follow cultures - RPR, HIV RNA - Standard precautions   Evaluation of this patient requires complex antimicrobial therapy evaluation and counseling + isolation needs for disease transmission risk assessment and mitigation    Microbiology:   Antibiotics: Biktarvy  6/8-present Cefepime  6/4-present Linezolid 6/8-present Vancomycin  6/5 - 6/7 Cultures: Blood 6/5 pending Urine  Other   SUBJECTIVE: Resting in bed. Rash imprving Interval: afebrile overnight. Wbc 11.5k  Review of Systems: Review of Systems  All other systems reviewed and are negative.    Scheduled Meds:  bictegravir-emtricitabine -tenofovir  AF  1 tablet Oral Daily    enoxaparin  (LOVENOX ) injection  40 mg Subcutaneous Q24H   FLUoxetine   20 mg Oral Daily   folic acid   1 mg Oral Daily   gabapentin   300 mg Oral TID   hydrOXYzine   25 mg Oral TID   linezolid  600 mg Oral Q12H   loratadine   10 mg Oral Daily   multivitamin with minerals  1 tablet Oral Daily   predniSONE   20 mg Oral Daily   sodium chloride  flush  3 mL Intravenous Q12H   sodium chloride  flush  3 mL Intravenous Q12H   thiamine   100 mg Oral Daily   Or   thiamine   100 mg Intravenous Daily   Continuous Infusions:  ceFEPime  (MAXIPIME ) IV 2 g (11/21/23 0957)   PRN Meds:.acetaminophen  **OR** acetaminophen , diphenhydrAMINE , ondansetron  **OR** ondansetron  (ZOFRAN ) IV, oxyCODONE , sodium chloride  flush Allergies  Allergen Reactions   Doxycycline  Rash    ? Sun exposed rash from doxy vs adverse drug rxn vs completely not related    OBJECTIVE: Vitals:   11/21/23 0700 11/21/23 0800 11/21/23 0900 11/21/23 1000  BP:      Pulse:      Resp: 15 14 14 11   Temp:      TempSrc:      SpO2:      Weight:      Height:       Body mass index is 29.05 kg/m.  Physical Exam Constitutional:      General: He is not in acute distress.    Appearance: He is normal weight. He is not toxic-appearing.  HENT:     Head: Normocephalic and atraumatic.     Right Ear:  External ear normal.     Left Ear: External ear normal.     Nose: No congestion or rhinorrhea.     Mouth/Throat:     Mouth: Mucous membranes are moist.     Pharynx: Oropharynx is clear.  Eyes:     Extraocular Movements: Extraocular movements intact.     Conjunctiva/sclera: Conjunctivae normal.     Pupils: Pupils are equal, round, and reactive to light.  Cardiovascular:     Rate and Rhythm: Normal rate and regular rhythm.     Heart sounds: No murmur heard.    No friction rub. No gallop.  Pulmonary:     Effort: Pulmonary effort is normal.     Breath sounds: Normal breath sounds.  Abdominal:     General: Abdomen is flat. Bowel sounds are  normal.     Palpations: Abdomen is soft.  Musculoskeletal:        General: No swelling.     Cervical back: Normal range of motion and neck supple.  Skin:    General: Skin is warm and dry.     Comments: Rash through out body  Neurological:     General: No focal deficit present.     Mental Status: He is oriented to person, place, and time.  Psychiatric:        Mood and Affect: Mood normal.       Lab Results Lab Results  Component Value Date   WBC 11.5 (H) 11/21/2023   HGB 12.5 (L) 11/21/2023   HCT 38.6 (L) 11/21/2023   MCV 94.8 11/21/2023   PLT 308 11/21/2023    Lab Results  Component Value Date   CREATININE 0.77 11/21/2023   BUN 11 11/21/2023   NA 136 11/21/2023   K 4.2 11/21/2023   CL 104 11/21/2023   CO2 23 11/21/2023    Lab Results  Component Value Date   ALT 39 11/21/2023   AST 28 11/21/2023   ALKPHOS 54 11/21/2023   BILITOT 0.6 11/21/2023        Orlie Bjornstad, MD [facility] 11/21/2023, 10:19 AM

## 2023-11-21 NOTE — Op Note (Signed)
 PATIENT NAME: Jimmy Fisher   MEDICAL RECORD NO.:   [id number]   DATE OF BIRTH: August 20, 1980   DATE OF PROCEDURE: 11/21/2023                               OPERATIVE REPORT   PREOPERATIVE DIAGNOSES: 1.  Complex right elbow abscess 2.  Bilateral upper extremity cellulitis  POSTOPERATIVE DIAGNOSES: 1.  Complex right elbow abscess 2.  Bilateral upper extremity cellulitis  PROCEDURE: Complicated incision, drainage, and packing of subcutaneous right elbow abscess  SURGEON:  Virl Grimes, MD.  ASSISTANT: None  ANESTHESIA:  General endotracheal anesthesia.  COMPLICATIONS:  None.  DISPOSITION:  Stable.  ESTIMATED BLOOD LOSS:  Minimal.  INDICATIONS FOR SURGERY:  Briefly, patient is a 43 year old male who I was consulted on yesterday due to admission 3 days ago for bilateral upper extremity erythema and swelling.  His imaging and CT images were consistent with cellulitis bilaterally, as well as an abscess involving the subcutaneous tissue on the right side, in the region of the lateral elbow.  I did discuss with the patient the need for an incision and drainage and packing of the abscess.  He did understand the risks of surgery, including the risk of recurrent infection, as well as neurovascular injury.  He did wish to proceed.  OPERATIVE DETAILS:  On 11/21/2023, the patient was brought to surgery and general endotracheal anesthesia was administered.  The patient was placed supine on hospital bed, with the right arm extended onto an arm table. Antibiotics were given and the right upper extremity was prepped and draped in the usual sterile fashion.  A time-out procedure was performed.  At this point, an 11 blade knife was used to perform a stab incision over the center of the abscess.  Purulent drainage was readily expressed from the wound.  Cultures were obtained and sent to the lab.  I did use a hemostat to break up multiple subcutaneous loculations.  I did use micro irrigation through  the wound to additionally irrigate the purulent material.  A significant amount of purulence was expressed through the wound.  I did continue to use a hemostat and irrigation in order to ensure full expression of the purulent material.  Once the purulence was fully expressed, iodoform gauze was liberally packed into the wound.  At this point, 4 x 4's were placed over the wound, followed by Kerlix, which was secured with tape. All instrument counts were correct at the termination of the procedure.  Patient was awoken from general endotracheal anesthesia and transferred to recovery in stable condition.   Virl Grimes, MD.

## 2023-11-21 NOTE — Progress Notes (Signed)
 Discussed with patient about his visit and suicide risk.   Patient has attempted to hurt himself in the recent past.  He states he was actively getting help from Behavioral health because relapsed.  He was clean since January 2025.  He states he wants to get better but had infection of right arm so he had to be transferred to [facility] ED before getting in rehab program at [facility].    He states he needs his cholesterol and GERD medications.  He is still itching and in pain.  Will page provider.

## 2023-11-21 NOTE — Anesthesia Procedure Notes (Signed)
 Procedure Name: LMA Insertion Date/Time: 11/21/2023 4:59 PM  Performed by: Elaina Graver, CRNAPre-anesthesia Checklist: Patient identified, Emergency Drugs available, Suction available and Patient being monitored Patient Re-evaluated:Patient Re-evaluated prior to induction Oxygen Delivery Method: Circle System Utilized Preoxygenation: Pre-oxygenation with 100% oxygen Induction Type: IV induction Ventilation: Mask ventilation without difficulty LMA: LMA inserted LMA Size: 4.0 Number of attempts: 1 Airway Equipment and Method: Bite block Placement Confirmation: positive ETCO2 Tube secured with: Tape Dental Injury: Teeth and Oropharynx as per pre-operative assessment

## 2023-11-21 NOTE — Transfer of Care (Signed)
 Immediate Anesthesia Transfer of Care Note  Patient: Jimmy Fisher  Procedure(s) Performed: IRRIGATION AND DEBRIDEMENT ELBOW (Right)  Patient Location: PACU  Anesthesia Type:General  Level of Consciousness: drowsy  Airway & Oxygen Therapy: Patient Spontanous Breathing and Patient connected to face mask oxygen  Post-op Assessment: Report given to RN and Post -op Vital signs reviewed and stable  Post vital signs: Reviewed and stable  Last Vitals:  Vitals Value Taken Time  BP 153/96 11/21/23 1730  Temp    Pulse 73 11/21/23 1735  Resp 15 11/21/23 1735  SpO2 100 % 11/21/23 1735  Vitals shown include unfiled device data.  Last Pain:  Vitals:   11/21/23 1452  TempSrc: Oral  PainSc: 8       Patients Stated Pain Goal: 5 (11/21/23 1452)  Complications: No notable events documented.

## 2023-11-22 ENCOUNTER — Encounter (HOSPITAL_COMMUNITY): Payer: Self-pay | Admitting: Orthopedic Surgery

## 2023-11-22 DIAGNOSIS — L03311 Cellulitis of abdominal wall: Secondary | ICD-10-CM | POA: Diagnosis not present

## 2023-11-22 DIAGNOSIS — F332 Major depressive disorder, recurrent severe without psychotic features: Secondary | ICD-10-CM | POA: Diagnosis not present

## 2023-11-22 DIAGNOSIS — L03113 Cellulitis of right upper limb: Secondary | ICD-10-CM | POA: Diagnosis not present

## 2023-11-22 DIAGNOSIS — L309 Dermatitis, unspecified: Secondary | ICD-10-CM | POA: Diagnosis not present

## 2023-11-22 DIAGNOSIS — F1990 Other psychoactive substance use, unspecified, uncomplicated: Secondary | ICD-10-CM | POA: Diagnosis not present

## 2023-11-22 DIAGNOSIS — L02413 Cutaneous abscess of right upper limb: Secondary | ICD-10-CM | POA: Diagnosis not present

## 2023-11-22 DIAGNOSIS — B20 Human immunodeficiency virus [HIV] disease: Secondary | ICD-10-CM | POA: Diagnosis not present

## 2023-11-22 DIAGNOSIS — B9689 Other specified bacterial agents as the cause of diseases classified elsewhere: Secondary | ICD-10-CM

## 2023-11-22 LAB — CBC
HCT: 36.4 % — ABNORMAL LOW (ref 39.0–52.0)
Hemoglobin: 11.7 g/dL — ABNORMAL LOW (ref 13.0–17.0)
MCH: 30.5 pg (ref 26.0–34.0)
MCHC: 32.1 g/dL (ref 30.0–36.0)
MCV: 94.8 fL (ref 80.0–100.0)
Platelets: 302 10*3/uL (ref 150–400)
RBC: 3.84 MIL/uL — ABNORMAL LOW (ref 4.22–5.81)
RDW: 13.2 % (ref 11.5–15.5)
WBC: 11.4 10*3/uL — ABNORMAL HIGH (ref 4.0–10.5)
nRBC: 0 % (ref 0.0–0.2)

## 2023-11-22 LAB — COMPREHENSIVE METABOLIC PANEL WITH GFR
ALT: 53 U/L — ABNORMAL HIGH (ref 0–44)
AST: 59 U/L — ABNORMAL HIGH (ref 15–41)
Albumin: 2.7 g/dL — ABNORMAL LOW (ref 3.5–5.0)
Alkaline Phosphatase: 54 U/L (ref 38–126)
Anion gap: 6 (ref 5–15)
BUN: 13 mg/dL (ref 6–20)
CO2: 24 mmol/L (ref 22–32)
Calcium: 7.9 mg/dL — ABNORMAL LOW (ref 8.9–10.3)
Chloride: 104 mmol/L (ref 98–111)
Creatinine, Ser: 0.84 mg/dL (ref 0.61–1.24)
GFR, Estimated: >60 mL/min (ref >60)
Glucose, Bld: 132 mg/dL — ABNORMAL HIGH (ref 70–99)
Potassium: 3.5 mmol/L (ref 3.5–5.1)
Sodium: 134 mmol/L — ABNORMAL LOW (ref 135–145)
Total Bilirubin: 0.4 mg/dL (ref 0.0–1.2)
Total Protein: 5.6 g/dL — ABNORMAL LOW (ref 6.5–8.1)

## 2023-11-22 LAB — HIV-1 RNA QUANT-NO REFLEX-BLD
HIV 1 RNA Quant: 170 {copies}/mL
LOG10 HIV-1 RNA: 2.23 {Log_copies}/mL

## 2023-11-22 MED ORDER — ALUM & MAG HYDROXIDE-SIMETH 200-200-20 MG/5ML PO SUSP
30.0000 mL | ORAL | Status: DC | PRN
  Administered 2023-11-22 – 2023-11-23 (×2): 30 mL via ORAL
  Filled 2023-11-22 (×2): qty 30

## 2023-11-22 MED ORDER — AMOXICILLIN-POT CLAVULANATE 875-125 MG PO TABS
1.0000 | ORAL_TABLET | Freq: Two times a day (BID) | ORAL | Status: DC
  Administered 2023-11-22 – 2023-11-24 (×5): 1 via ORAL
  Filled 2023-11-22 (×5): qty 1

## 2023-11-22 MED ORDER — IRBESARTAN 150 MG PO TABS
150.0000 mg | ORAL_TABLET | Freq: Every day | ORAL | Status: DC
  Administered 2023-11-22 – 2023-11-24 (×3): 150 mg via ORAL
  Filled 2023-11-22 (×3): qty 1

## 2023-11-22 NOTE — TOC Initial Note (Signed)
 Transition of Care [facility]) - Initial/Assessment Note    Patient Details  Name: Jimmy Fisher MRN: [id number] Date of Birth: 1980/09/17  Transition [facility]) CM/SW Contact:    Jonni Nettle, LCSW Phone Number: 11/22/2023, 10:52 AM  Clinical Narrative:                 TOC consulted for substance abuse resources. CSW met with pt at bedside to discuss discharge planning and substance abuse. Pt reports he initially went to [facility] for detox, but staff sent him to [facility] for medical clearance. Pt admitted for cellulitis of the bilateral upper and lower extremities at the IV drug injecting site. Pt reports recently relapsing on methamphetamine and alcohol. Pt reports he wants to go to residential treatment at [facility] in [facility]. CSW sent referral to [facility] via fax [phone]. TOC will continue to follow.   Expected Discharge Plan: IP Rehab Facility Barriers to Discharge: Continued Medical Work up   Patient Goals and CMS Choice Patient states their goals for this hospitalization and ongoing recovery are:: To go to rehab at [facility]    Expected Discharge Plan and Services In-house Referral: Clinical Social Work   Post Acute Care Choice: NA Living arrangements for the past 2 months: Homeless                 DME Arranged: N/A DME Agency: NA         HH Agency: NA        Prior Living Arrangements/Services Living arrangements for the past 2 months: Homeless Lives with:: Self Patient language and need for interpreter reviewed:: Yes Do you feel safe going back to the place where you live?: No   Pt reports wanting to go to rehab for meth and alcohol use  Need for Family Participation in Patient Care: No (Comment) Care giver support system in place?: No (comment)   Criminal Activity/Legal Involvement Pertinent to Current Situation/Hospitalization: No - Comment as needed  Activities of Daily Living   ADL Screening (condition at time of admission) Independently performs ADLs?:  Yes (appropriate for developmental age) Is the patient deaf or have difficulty hearing?: No Does the patient have difficulty seeing, even when wearing glasses/contacts?: No Does the patient have difficulty concentrating, remembering, or making decisions?: No  Permission Sought/Granted Permission sought to share information with : Facility Medical sales representative Permission granted to share information with : Yes, Verbal Permission Granted     Permission granted to share info w AGENCY: [facility]     Permission granted to share info w Contact Information: [phone]  Emotional Assessment Appearance:: Appears stated age Attitude/Demeanor/Rapport: Guarded, Engaged Affect (typically observed): Stable, Irritable Orientation: : Oriented to Self, Oriented to Place, Oriented to  Time, Oriented to Situation Alcohol / Substance Use: Illicit Drugs, Alcohol Use Psych Involvement: Yes (comment) (Current)  Admission diagnosis:  Hypokalemia [E87.6] Elevated CK [R74.8] Abdominal wall cellulitis [L03.311] Patient Active Problem List   Diagnosis Date Noted   IVDU (intravenous drug user) 11/18/2023   Rhabdomyolysis 11/18/2023   Alcohol use 11/18/2023   History of HIV infection (HCC) 11/18/2023   History of pulmonary embolism 11/18/2023   Cellulitis of extremity 11/18/2023   Hypokalemia 11/18/2023   Neck fullness 09/07/2023   Vaccine counseling 05/09/2023   Polysubstance abuse (HCC) 04/25/2023   MDD (major depressive disorder), recurrent severe, 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/2023   Needs assistance with community resources 04/25/2023   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   Urticarial rash 12/05/2022   Phlebitis 04/28/2022  Onychomycosis 04/14/2022   Erectile dysfunction 05/26/2021   Frequency of micturition 05/26/2021   ADHD 02/20/2021   Polyarthralgia 02/19/2021   Impingement syndrome of left shoulder    Numbness and tingling of leg 08/19/2020   Hemorrhoids 08/19/2020    Essential hypertension 04/03/2020   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   Generalized anxiety disorder 01/30/2020   Flank pain 01/10/2020   Healthcare maintenance 01/10/2020   Fibromyalgia 01/10/2020   Hyperlipidemia 01/10/2020   GERD (gastroesophageal reflux disease) 12/18/2019   HIV disease (HCC)    Opioid dependence with opioid-induced mood disorder (HCC)    Major depressive disorder, 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/2019   PCP:  Sandie Cross, MD Pharmacy:   [facility] - [facility] [address] [place] [address] Phone: [phone] Fax: [phone]  [facility] [place], [place] - [place] [address] [place] [address] Phone: [phone] Fax: [phone]  [facility]) [phone] - [place], [place] - [address] AT [facility] [address] [place] [address] Phone: [phone] Fax: [phone]  [facility] - [place], [place] - [address] [place] [address] Phone: [phone] Fax: [phone]  [facility] - [place], [place] - [address] [facility] [address] [place] [address] Phone: [phone] Fax: [phone]     Social Drivers of Health (SDOH) Social History: SDOH Screenings   Food Insecurity: Food Insecurity Present (11/18/2023)  Housing: High Risk (11/18/2023)  Transportation Needs: No Transportation Needs (11/18/2023)  Utilities: Not At Risk (11/18/2023)  Alcohol Screen: Low Risk  (10/14/2019)  Depression (PHQ2-9): Low Risk  (09/07/2023)  Recent Concern: Depression (PHQ2-9) - Medium Risk (09/07/2023)  Social Connections: Unknown (11/18/2023)  Tobacco Use: High Risk (11/21/2023)   SDOH Interventions: None indicated     Readmission Risk Interventions    11/22/2023   10:48 AM  Readmission Risk Prevention Plan  Transportation Screening Complete  PCP or  Specialist Appt within 3-5 Days Complete  HRI or Home Care Consult Not Complete  HRI or Home Care Consult comments N/A  Social Work Consult for Recovery Care Planning/Counseling Complete  Palliative Care Screening Not Applicable  Medication Review (RN Care Manager) Complete    Le Primes, MSW, LCSW 11/22/2023 10:59 AM

## 2023-11-22 NOTE — Plan of Care (Signed)

## 2023-11-22 NOTE — Progress Notes (Signed)
 Vascular consult placed. Pt complains of IV burning when flushed. Pt refused IV repacaement tonight. Pt agreed for IV placement before scheduled 8am antibiotic.
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
## 2023-11-23 DIAGNOSIS — L03113 Cellulitis of right upper limb: Secondary | ICD-10-CM | POA: Diagnosis not present

## 2023-11-23 LAB — CULTURE, BLOOD (ROUTINE X 2)
Culture: NO GROWTH
Culture: NO GROWTH

## 2023-11-23 MED ORDER — PANTOPRAZOLE SODIUM 40 MG PO TBEC
40.0000 mg | DELAYED_RELEASE_TABLET | Freq: Every day | ORAL | Status: DC
  Administered 2023-11-23 – 2023-11-24 (×2): 40 mg via ORAL
  Filled 2023-11-23 (×2): qty 1

## 2023-11-23 NOTE — TOC Progression Note (Signed)
 Transition of Care [facility]) - Progression Note    Patient Details  Name: Jimmy Fisher MRN: [id number] Date of Birth: [birth year]  Transition of Care [facility]) CM/SW Contact  Jonni Nettle, LCSW Phone Number: 11/23/2023, 9:59 AM  Clinical Narrative:    CSW attempted to speak with admissions coordinator, Moira Andrews [phone], at [facility] in [facility], via phone call. No answer, voicemail left. TOC will continue to follow.   Expected Discharge Plan: IP Rehab Facility Barriers to Discharge: Continued Medical Work up  Expected Discharge Plan and Services In-house Referral: Clinical Social Work   Post Acute Care Choice: NA Living arrangements for the past 2 months: Homeless                 DME Arranged: N/A DME Agency: NA         HH Agency: NA         Social Determinants of Health (SDOH) Interventions SDOH Screenings   Food Insecurity: Food Insecurity Present (11/18/2023)  Housing: High Risk (11/18/2023)  Transportation Needs: No Transportation Needs (11/18/2023)  Utilities: Not At Risk (11/18/2023)  Alcohol Screen: Low Risk  (10/14/2019)  Depression (PHQ2-9): Low Risk  (09/07/2023)  Recent Concern: Depression (PHQ2-9) - Medium Risk (09/07/2023)  Social Connections: Unknown (11/18/2023)  Tobacco Use: High Risk (11/21/2023)    Readmission Risk Interventions    11/22/2023   10:48 AM  Readmission Risk Prevention Plan  Transportation Screening Complete  PCP or Specialist Appt within 3-5 Days Complete  HRI or Home Care Consult Not Complete  HRI or Home Care Consult comments N/A  Social Work Consult for Recovery Care Planning/Counseling Complete  Palliative Care Screening Not Applicable  Medication Review (RN Care Manager) Complete    Le Primes, MSW, LCSW 11/23/2023 10:00 AM

## 2023-11-23 NOTE — TOC Progression Note (Signed)
 Transition of Care [facility]) - Progression Note    Patient Details  Name: Jimmy Fisher MRN: [id number] Date of Birth: Nov 30, 1980  Transition of Care [facility]) CM/SW Contact  Jonni Nettle, LCSW Phone Number: 11/23/2023, 3:43 PM  Clinical Narrative:    CSW received phone call from Octavio Ben [phone], admissions coordinator at [facility]. Per Ludwig Safer, there are no available beds at [facility] at this time. TOC will continue to follow.   Expected Discharge Plan: IP Rehab Facility Barriers to Discharge: Continued Medical Work up  Expected Discharge Plan and Services In-house Referral: Clinical Social Work   Post Acute Care Choice: NA Living arrangements for the past 2 months: Homeless                 DME Arranged: N/A DME Agency: NA         HH Agency: NA         Social Determinants of Health (SDOH) Interventions SDOH Screenings   Food Insecurity: Food Insecurity Present (11/18/2023)  Housing: High Risk (11/18/2023)  Transportation Needs: No Transportation Needs (11/18/2023)  Utilities: Not At Risk (11/18/2023)  Alcohol Screen: Low Risk  (10/14/2019)  Depression (PHQ2-9): Low Risk  (09/07/2023)  Recent Concern: Depression (PHQ2-9) - Medium Risk (09/07/2023)  Social Connections: Unknown (11/18/2023)  Tobacco Use: High Risk (11/21/2023)    Readmission Risk Interventions    11/22/2023   10:48 AM  Readmission Risk Prevention Plan  Transportation Screening Complete  PCP or Specialist Appt within 3-5 Days Complete  HRI or Home Care Consult Not Complete  HRI or Home Care Consult comments N/A  Social Work Consult for Recovery Care Planning/Counseling Complete  Palliative Care Screening Not Applicable  Medication Review (RN Care Manager) Complete    Le Primes, MSW, LCSW 11/23/2023 3:44 PM
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
## 2023-11-23 NOTE — Plan of Care (Signed)

## 2023-11-23 NOTE — Progress Notes (Signed)
 Per patient, he was able to find a rehab upon discharge call [facility] [phone]). This facility stated that they have a bed available for him, but TB test is needed. I informed patient that I would pass this along to the oncoming nurse for day shift.

## 2023-11-24 ENCOUNTER — Other Ambulatory Visit: Payer: Self-pay

## 2023-11-24 ENCOUNTER — Other Ambulatory Visit (HOSPITAL_COMMUNITY): Payer: Self-pay

## 2023-11-24 ENCOUNTER — Telehealth (HOSPITAL_COMMUNITY): Payer: Self-pay | Admitting: Pharmacy Technician

## 2023-11-24 MED ORDER — OLMESARTAN MEDOXOMIL 20 MG PO TABS
20.0000 mg | ORAL_TABLET | Freq: Every day | ORAL | 3 refills | Status: DC
  Filled 2023-11-24: qty 90, 90d supply, fill #0

## 2023-11-24 MED ORDER — FOLIC ACID 1 MG PO TABS
1.0000 mg | ORAL_TABLET | Freq: Every day | ORAL | 0 refills | Status: DC
  Filled 2023-11-24: qty 90, 90d supply, fill #0

## 2023-11-24 MED ORDER — OMEPRAZOLE 20 MG PO CPDR
20.0000 mg | DELAYED_RELEASE_CAPSULE | Freq: Two times a day (BID) | ORAL | 1 refills | Status: DC
Start: 2023-11-24 — End: 2023-12-20
  Filled 2023-11-24: qty 60, 30d supply, fill #0

## 2023-11-24 MED ORDER — ADULT MULTIVITAMIN W/MINERALS CH
1.0000 | ORAL_TABLET | Freq: Every day | ORAL | 0 refills | Status: DC
  Filled 2023-11-24: qty 100, 100d supply, fill #0

## 2023-11-24 MED ORDER — AMOXICILLIN-POT CLAVULANATE 875-125 MG PO TABS
1.0000 | ORAL_TABLET | Freq: Two times a day (BID) | ORAL | 0 refills | Status: AC
  Filled 2023-11-24: qty 20, 10d supply, fill #0

## 2023-11-24 MED ORDER — VITAMIN B-1 100 MG PO TABS
100.0000 mg | ORAL_TABLET | Freq: Every day | ORAL | 0 refills | Status: DC
  Filled 2023-11-24: qty 100, 100d supply, fill #0

## 2023-11-24 MED ORDER — GABAPENTIN 300 MG PO CAPS
300.0000 mg | ORAL_CAPSULE | Freq: Three times a day (TID) | ORAL | 0 refills | Status: DC
  Filled 2023-11-24: qty 30, 10d supply, fill #0

## 2023-11-24 MED ORDER — LINEZOLID 600 MG PO TABS
600.0000 mg | ORAL_TABLET | Freq: Two times a day (BID) | ORAL | 0 refills | Status: AC
  Filled 2023-11-24 (×2): qty 20, 10d supply, fill #0

## 2023-11-24 MED ORDER — LORATADINE 10 MG PO TABS
10.0000 mg | ORAL_TABLET | Freq: Every day | ORAL | 0 refills | Status: DC
  Filled 2023-11-24: qty 5, 5d supply, fill #0

## 2023-11-24 MED ORDER — FLUOXETINE HCL 40 MG PO CAPS
40.0000 mg | ORAL_CAPSULE | Freq: Every day | ORAL | 3 refills | Status: DC
  Filled 2023-11-24: qty 90, 90d supply, fill #0

## 2023-11-24 MED ORDER — HYDROXYZINE HCL 25 MG PO TABS
25.0000 mg | ORAL_TABLET | Freq: Three times a day (TID) | ORAL | 0 refills | Status: AC | PRN
  Filled 2023-11-24: qty 15, 5d supply, fill #0

## 2023-11-24 MED ORDER — BIKTARVY 50-200-25 MG PO TABS
1.0000 | ORAL_TABLET | Freq: Every day | ORAL | 1 refills | Status: DC
  Filled 2023-11-24 – 2023-12-08 (×2): qty 90, 90d supply, fill #0

## 2023-11-24 MED ORDER — ATORVASTATIN CALCIUM 10 MG PO TABS
10.0000 mg | ORAL_TABLET | Freq: Every day | ORAL | 1 refills | Status: DC
  Filled 2023-11-24: qty 30, 30d supply, fill #0

## 2023-11-24 NOTE — Telephone Encounter (Signed)
 Pharmacy Patient Advocate Encounter   Received notification from Inpatient Request that prior authorization for Linezolid 600MG  tablets is required/requested.   Insurance verification completed.   The patient is insured through [employer] .   Per test claim: PA required; PA submitted to above mentioned insurance via CoverMyMeds Key/confirmation #/EOC [id number] Status is pending

## 2023-11-24 NOTE — Progress Notes (Signed)
 Patient educated on medication and other discharge instructions, since he is waiting on prior auth for Zyvox,he will call before going to pharmacy and pick up later today, left unit in no acute distress with mother

## 2023-11-24 NOTE — Plan of Care (Signed)

## 2023-11-24 NOTE — TOC Transition Note (Signed)
 Transition of Care [facility]) - Discharge Note   Patient Details  Name: Jimmy Fisher MRN: [id number] Date of Birth: December 30, 1980  Transition of Care [facility]) CM/SW Contact:  Jonni Nettle, LCSW Phone Number: 11/24/2023, 10:17 AM   Clinical Narrative:    Pt to transfer to [facility] for residential substance abuse rehab. Pt's family to transport pt at discharge. [facility] to provide meds-to-bed to take upon discharge. No further TOC needs at this time.    Final next level of care: IP Rehab Facility Barriers to Discharge: Barriers Resolved   Patient Goals and CMS Choice Patient states their goals for this hospitalization and ongoing recovery are:: To go to rehab for SUD treatment        Discharge Placement [facility]  Discharge Plan and Services Additional resources added to the After Visit Summary for [facility] In-house Referral: Clinical Social Work   Post Acute Care Choice: NA          DME Arranged: N/A DME Agency: NA         HH Agency: NA        Social Drivers of Health (SDOH) Interventions SDOH Screenings   Food Insecurity: Food Insecurity Present (11/18/2023)  Housing: High Risk (11/18/2023)  Transportation Needs: No Transportation Needs (11/18/2023)  Utilities: Not At Risk (11/18/2023)  Alcohol Screen: Low Risk  (10/14/2019)  Depression (PHQ2-9): Low Risk  (09/07/2023)  Recent Concern: Depression (PHQ2-9) - Medium Risk (09/07/2023)  Social Connections: Unknown (11/18/2023)  Tobacco Use: High Risk (11/21/2023)     Readmission Risk Interventions    11/22/2023   10:48 AM  Readmission Risk Prevention Plan  Transportation Screening Complete  PCP or Specialist Appt within 3-5 Days Complete  HRI or Home Care Consult Not Complete  HRI or Home Care Consult comments N/A  Social Work Consult for Recovery Care Planning/Counseling Complete  Palliative Care Screening Not Applicable  Medication Review (RN Care Manager)  Complete     [place], MSW, LCSW 11/24/2023 10:20 AM

## 2023-11-24 NOTE — Plan of Care (Signed)
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
## 2023-11-25 ENCOUNTER — Telehealth: Payer: Self-pay

## 2023-11-25 ENCOUNTER — Other Ambulatory Visit (HOSPITAL_COMMUNITY): Payer: Self-pay

## 2023-11-25 NOTE — Transitions of Care (Post Inpatient/ED Visit) (Unsigned)
   11/25/2023  Name: Jimmy Fisher MRN: [id number] DOB: 09/04/1980  Today's TOC FU Call Status: Today's TOC FU Call Status:: Unsuccessful Call (1st Attempt) Unsuccessful Call (1st Attempt) Date: 11/25/23  Attempted to reach the patient regarding the most recent Inpatient/ED visit.  Follow Up Plan: Additional outreach attempts will be made to reach the patient to complete the Transitions of Care (Post Inpatient/ED visit) call.   Signature Darrall Ellison, LPN [facility] Nurse Health Advisor Direct Dial [phone]

## 2023-11-27 LAB — AEROBIC/ANAEROBIC CULTURE W GRAM STAIN (SURGICAL/DEEP WOUND)

## 2023-11-28 NOTE — Telephone Encounter (Signed)
 Pharmacy Patient Advocate Encounter  Received notification from [facility] that Prior Authorization for Linezolid  600MG  tablets  has been APPROVED from 11/25/2023 to 12/05/2023   PA #/Case ID/Reference #: [id number]

## 2023-11-28 NOTE — Transitions of Care (Post Inpatient/ED Visit) (Unsigned)
   11/28/2023  Name: GUNNISON CHAHAL MRN: [id number] DOB: 12-Sep-1980  Today's TOC FU Call Status: Today's TOC FU Call Status:: Unsuccessful Call (2nd Attempt) Unsuccessful Call (1st Attempt) Date: 11/25/23 Unsuccessful Call (2nd Attempt) Date: 11/28/23  Attempted to reach the patient regarding the most recent Inpatient/ED visit.  Follow Up Plan: Additional outreach attempts will be made to reach the patient to complete the Transitions of Care (Post Inpatient/ED visit) call.   Signature Darrall Ellison, LPN [facility] Nurse Health Advisor Direct Dial [phone]

## 2023-11-29 NOTE — Transitions of Care (Post Inpatient/ED Visit) (Signed)
   11/29/2023  Name: Jimmy Fisher MRN: [id number] DOB: 22-Jan-1981  Today's TOC FU Call Status: Today's TOC FU Call Status:: Unsuccessful Call (3rd Attempt) Unsuccessful Call (1st Attempt) Date: 11/25/23 Unsuccessful Call (2nd Attempt) Date: 11/28/23 Unsuccessful Call (3rd Attempt) Date: 11/29/23  Attempted to reach the patient regarding the most recent Inpatient/ED visit.  Follow Up Plan: No further outreach attempts will be made at this time. We have been unable to contact the patient.  Signature Darrall Ellison, LPN [facility] Nurse Health Advisor Direct Dial [phone]

## 2023-11-30 LAB — QUANTIFERON-TB GOLD PLUS

## 2023-12-02 ENCOUNTER — Other Ambulatory Visit (HOSPITAL_COMMUNITY): Payer: Self-pay

## 2023-12-06 ENCOUNTER — Other Ambulatory Visit: Payer: Self-pay

## 2023-12-06 ENCOUNTER — Ambulatory Visit: Payer: Self-pay | Admitting: Dietician

## 2023-12-06 VITALS — BP 130/83 | HR 84 | Temp 98.0°F | Ht 66.0 in | Wt 193.6 lb

## 2023-12-06 DIAGNOSIS — L03113 Cellulitis of right upper limb: Secondary | ICD-10-CM

## 2023-12-06 DIAGNOSIS — R7401 Elevation of levels of liver transaminase levels: Secondary | ICD-10-CM | POA: Diagnosis not present

## 2023-12-06 DIAGNOSIS — K219 Gastro-esophageal reflux disease without esophagitis: Secondary | ICD-10-CM

## 2023-12-06 DIAGNOSIS — B20 Human immunodeficiency virus [HIV] disease: Secondary | ICD-10-CM

## 2023-12-06 DIAGNOSIS — D649 Anemia, unspecified: Secondary | ICD-10-CM

## 2023-12-06 MED ORDER — SUCRALFATE 1 GM/10ML PO SUSP
1.0000 g | Freq: Three times a day (TID) | ORAL | 6 refills | Status: DC
  Filled 2023-12-06: qty 420, 11d supply, fill #0
  Filled 2023-12-12 – 2023-12-16 (×3): qty 420, 11d supply, fill #1

## 2023-12-06 NOTE — Telephone Encounter (Signed)
 Pt has been arrived  Name: Jimmy Fisher, [place] MRN: [id number]  Date: 12/06/2023 Status: Arrived  Time: 4:15 PM Length: 30  Visit Type: HOSPITAL FOLLOW UP [8005] Copay: $0.00  Provider: Jolaine Pac, DO       Copied from CRM [id number]. Topic: General - Running Late >> Dec 06, 2023  2:15 PM Shamecia H wrote: Patient/patient representative is calling because they are running late for an appointment, he went to the old building

## 2023-12-06 NOTE — Progress Notes (Unsigned)
 CC: Hospital Follow Up after admission from 11/17/2023 to 11/24/2023 for multiple sites of skin and soft tissue infection  HPI:  Jimmy Fisher is a 43 y.o. male with pertinent PMH of HIV, polysubstance abuse, IV drug use, alcohol use disorder, depression, anxiety, IBS, and fibromyalgia who presents as above. Please see assessment and plan below for further details.  Follow up Hospitalization  Patient was admitted to [facility] hospital on 11/17/2023 and discharged on 11/24/2023. He was treated for multiple areas of cellulitis and a right upper extremity abscess. Treatment for this included IV antibiotics and incision and drainage of right upper extremity abscess. Telephone follow up was done on 11/25/2023 He reports excellent compliance with treatment. He reports this condition is resolved.  -----------------------------------------------------------------------------------------   Medications: Current Outpatient Medications  Medication Instructions   atorvastatin  (LIPITOR) 10 mg, Oral, Daily   bictegravir-emtricitabine -tenofovir  AF (BIKTARVY ) 50-200-25 MG TABS tablet 1 tablet, Oral, Daily   FLUoxetine  (PROZAC ) 40 mg, Oral, Daily   folic acid  (FOLVITE ) 1 mg, Oral, Daily   gabapentin  (NEURONTIN ) 300 mg, Oral, 3 times daily   loratadine  (CLARITIN ) 10 mg, Oral, Daily   Multiple Vitamin (MULTIVITAMIN WITH MINERALS) TABS tablet 1 tablet, Oral, Daily   olmesartan  (BENICAR ) 20 mg, Oral, Daily   omeprazole  (PRILOSEC) 20 mg, Oral, 2 times daily   sucralfate  (CARAFATE ) 1 g, Oral, 3 times daily with meals & bedtime   thiamine  (VITAMIN B1) 100 mg, Oral, Daily     Review of Systems:   Pertinent items noted in HPI and/or A&P.  Physical Exam:  Vitals:   12/06/23 1509  BP: 130/83  Pulse: 84  Temp: 98 F (36.7 C)  TempSrc: Oral  SpO2: 96%  Weight: 193 lb 9.6 oz (87.8 kg)  Height: 5' 6 (1.676 m)    Constitutional: Slightly uncomfortable appearing middle-age male. In no acute  distress. HEENT: Normocephalic, atraumatic, Sclera non-icteric, PERRL, EOM intact Cardio:Regular rate and rhythm. 2+ bilateral radial pulses. Pulm:Clear to auscultation bilaterally. Normal work of breathing on room air. Abdomen: Soft, tender in the epigastric area, non-distended, positive bowel sounds. FDX:Wzhjupcz for extremity edema. Skin:Small area of induration without fluctuance and some surrounding erythema on the right lateral forearm near the elbow joint Neuro:Alert and oriented x3. No focal deficit noted. Psych:Pleasant mood and affect.   Assessment & Plan:   GERD (gastroesophageal reflux disease) Patient has a significant history of GERD and possible H. pylori infection in the past.  Last time he saw GI was in 2023 with tentative plans to do an EGD however he has not followed up since then.  He complains of severe recurrence of his GERD without vomiting or hematemesis.  He did have some bright red blood per rectum a couple weeks ago but this cleared up but now his stools are brown and he denies any melena.  He has had Carafate  which in addition to his omeprazole  has worked well for his symptoms but ran out of Carafate  last week. - Patient to contact GI for reevaluation - Continue omeprazole  20 mg twice daily and Carafate  1 g 3 times daily with meals and at bedtime - Return precautions discussed  Cellulitis of extremity Patient presents for a hospital follow-up after being admitted from 11/17/2023 - 11/24/2023 with multiple sites of cellulitis and an abscess on his right arm that needed incision and drainage.  He was discharged with a 10-day course of antibiotics which includes Augmentin  and linezolid .  He is finishing these either today or tomorrow.  He denies  any new or worsening signs of infection, recurrent use of IV needles, or signs of any systemic infection.  His right arm I&D site appears to be healing well with some overlying erythema but no warmth and no fluctuance.  He has a plan  for rehabilitation and needs a valid QuantiFERON gold.  This was drawn before he left the hospital but resulted as inadequate so this is redrawn today. - Continue and complete 10-day course of linezolid  and Augmentin  - Repeat CBC today shows resolution of leukocytosis concurrent with well treated infection - Return in 1 month for reevaluation with PCP  Transaminitis Prior to leaving the hospital patient developed mild transaminitis.  No significant right upper quadrant pain but he is having significant epigastric pain that clouds the picture.  We will repeat a CMP today.  CMP shows resolution of the transaminitis.    Patient discussed with Dr. [place] [facility]  Fairy Pool, DO Internal [facility] Center Internal Medicine Resident PGY-2 Clinic Phone: [phone] Please contact the on call pager at [phone] for any urgent or emergent needs.

## 2023-12-06 NOTE — Patient Instructions (Signed)
 Thank you, [name], for allowing us  to provide your care today. Today we discussed . . .  > GERD       - I have sent in a refill for your Carafate  and I think it is important that you contact the gastroenterology office at the number below.  If you have any issues getting an appointment please do not hesitate to give us  a call and we can send a new referral over.  [facility] [address] [place] [address] [place],  [place]  [address] Main: [phone]  > Blood test       - Today we are checking a blood count and metabolic panel as well as the QuantiFERON gold TB test.  I will call you if there are any abnormal results. > Cellulitis       - I am glad that you are cellulitis and abscess has gotten better.  Please complete the antibiotics and if there are any signs of returning infection please call the clinic right away or go to the hospital to be seen.   I have ordered the following labs for you:   Lab Orders         CMP14 + Anion Gap         CBC with Diff         QuantiFERON-TB Gold Plus       Follow up: 1 month w/ PCP    Remember:  Should you have any questions or concerns please call the internal medicine clinic at [phone].     Fairy Pool, DO [facility]

## 2023-12-07 ENCOUNTER — Other Ambulatory Visit (HOSPITAL_COMMUNITY): Payer: Self-pay

## 2023-12-07 ENCOUNTER — Other Ambulatory Visit: Payer: Self-pay

## 2023-12-07 DIAGNOSIS — R7401 Elevation of levels of liver transaminase levels: Secondary | ICD-10-CM | POA: Insufficient documentation

## 2023-12-07 NOTE — Assessment & Plan Note (Addendum)
 Prior to leaving the hospital patient developed mild transaminitis.  No significant right upper quadrant pain but he is having significant epigastric pain that clouds the picture.  We will repeat a CMP today.  CMP shows resolution of the transaminitis.

## 2023-12-07 NOTE — Assessment & Plan Note (Addendum)
 Patient presents for a hospital follow-up after being admitted from 11/17/2023 - 11/24/2023 with multiple sites of cellulitis and an abscess on his right arm that needed incision and drainage.  He was discharged with a 10-day course of antibiotics which includes Augmentin  and linezolid .  He is finishing these either today or tomorrow.  He denies any new or worsening signs of infection, recurrent use of IV needles, or signs of any systemic infection.  His right arm I&D site appears to be healing well with some overlying erythema but no warmth and no fluctuance.  He has a plan for rehabilitation and needs a valid QuantiFERON gold.  This was drawn before he left the hospital but resulted as inadequate so this is redrawn today. - Continue and complete 10-day course of linezolid  and Augmentin  - Repeat CBC today shows resolution of leukocytosis concurrent with well treated infection - Return in 1 month for reevaluation with PCP

## 2023-12-07 NOTE — Assessment & Plan Note (Signed)
 Patient has a significant history of GERD and possible H. pylori infection in the past.  Last time he saw GI was in 2023 with tentative plans to do an EGD however he has not followed up since then.  He complains of severe recurrence of his GERD without vomiting or hematemesis.  He did have some bright red blood per rectum a couple weeks ago but this cleared up but now his stools are brown and he denies any melena.  He has had Carafate  which in addition to his omeprazole  has worked well for his symptoms but ran out of Carafate  last week. - Patient to contact GI for reevaluation - Continue omeprazole  20 mg twice daily and Carafate  1 g 3 times daily with meals and at bedtime - Return precautions discussed

## 2023-12-08 ENCOUNTER — Other Ambulatory Visit: Payer: Self-pay

## 2023-12-08 ENCOUNTER — Other Ambulatory Visit (HOSPITAL_COMMUNITY): Payer: Self-pay

## 2023-12-08 LAB — QUANTIFERON-TB GOLD PLUS

## 2023-12-10 LAB — CMP14 + ANION GAP
ALT: 55 IU/L — ABNORMAL HIGH (ref 0–44)
AST: 29 IU/L (ref 0–40)
Albumin: 4.5 g/dL (ref 4.1–5.1)
Alkaline Phosphatase: 99 IU/L (ref 44–121)
Anion Gap: 15 mmol/L (ref 10.0–18.0)
BUN/Creatinine Ratio: 12 (ref 9–20)
BUN: 10 mg/dL (ref 6–24)
Bilirubin Total: 0.4 mg/dL (ref 0.0–1.2)
CO2: 19 mmol/L — ABNORMAL LOW (ref 20–29)
Calcium: 9.5 mg/dL (ref 8.7–10.2)
Chloride: 105 mmol/L (ref 96–106)
Creatinine, Ser: 0.83 mg/dL (ref 0.76–1.27)
Globulin, Total: 2.5 g/dL (ref 1.5–4.5)
Glucose: 101 mg/dL — ABNORMAL HIGH (ref 70–99)
Potassium: 4.2 mmol/L (ref 3.5–5.2)
Sodium: 139 mmol/L (ref 134–144)
Total Protein: 7 g/dL (ref 6.0–8.5)
eGFR: 110 mL/min/{1.73_m2} (ref >59)

## 2023-12-10 LAB — CBC WITH DIFFERENTIAL/PLATELET
Basophils Absolute: 0.1 10*3/uL (ref 0.0–0.2)
Basos: 1 %
EOS (ABSOLUTE): 0.4 10*3/uL (ref 0.0–0.4)
Eos: 5 %
Hematocrit: 43.6 % (ref 37.5–51.0)
Hemoglobin: 14.2 g/dL (ref 13.0–17.7)
Immature Grans (Abs): 0 10*3/uL (ref 0.0–0.1)
Immature Granulocytes: 0 %
Lymphocytes Absolute: 2.9 10*3/uL (ref 0.7–3.1)
Lymphs: 38 %
MCH: 30.7 pg (ref 26.6–33.0)
MCHC: 32.6 g/dL (ref 31.5–35.7)
MCV: 94 fL (ref 79–97)
Monocytes Absolute: 0.6 10*3/uL (ref 0.1–0.9)
Monocytes: 8 %
Neutrophils Absolute: 3.7 10*3/uL (ref 1.4–7.0)
Neutrophils: 48 %
Platelets: 290 10*3/uL (ref 150–450)
RBC: 4.63 x10E6/uL (ref 4.14–5.80)
RDW: 14.1 % (ref 11.6–15.4)
WBC: 7.7 10*3/uL (ref 3.4–10.8)

## 2023-12-10 LAB — QUANTIFERON-TB GOLD PLUS
QuantiFERON Mitogen Value: >10 [IU]/mL
QuantiFERON TB2 Ag Value: 0.19 [IU]/mL

## 2023-12-12 ENCOUNTER — Emergency Department (HOSPITAL_COMMUNITY)
Admission: EM | Admit: 2023-12-12 | Discharge: 2023-12-12 | Disposition: A | Discharge status: Home / Self Care | Attending: Emergency Medicine | Admitting: Emergency Medicine

## 2023-12-12 ENCOUNTER — Other Ambulatory Visit: Payer: Self-pay

## 2023-12-12 ENCOUNTER — Other Ambulatory Visit (HOSPITAL_COMMUNITY): Payer: Self-pay

## 2023-12-12 ENCOUNTER — Emergency Department (HOSPITAL_COMMUNITY)

## 2023-12-12 ENCOUNTER — Telehealth: Payer: Self-pay | Admitting: Gastroenterology

## 2023-12-12 DIAGNOSIS — I10 Essential (primary) hypertension: Secondary | ICD-10-CM | POA: Diagnosis not present

## 2023-12-12 DIAGNOSIS — Z21 Asymptomatic human immunodeficiency virus [HIV] infection status: Secondary | ICD-10-CM | POA: Diagnosis not present

## 2023-12-12 DIAGNOSIS — R1010 Upper abdominal pain, unspecified: Secondary | ICD-10-CM | POA: Diagnosis present

## 2023-12-12 DIAGNOSIS — R1013 Epigastric pain: Secondary | ICD-10-CM | POA: Diagnosis not present

## 2023-12-12 DIAGNOSIS — Z79899 Other long term (current) drug therapy: Secondary | ICD-10-CM | POA: Diagnosis not present

## 2023-12-12 LAB — URINALYSIS, ROUTINE W REFLEX MICROSCOPIC
Bilirubin Urine: NEGATIVE
Glucose, UA: NEGATIVE mg/dL
Hgb urine dipstick: NEGATIVE
Ketones, ur: NEGATIVE mg/dL
Leukocytes,Ua: NEGATIVE
Nitrite: NEGATIVE
Protein, ur: NEGATIVE mg/dL
Specific Gravity, Urine: 1.021 (ref 1.005–1.030)
pH: 5 (ref 5.0–8.0)

## 2023-12-12 LAB — COMPREHENSIVE METABOLIC PANEL WITH GFR
ALT: 43 U/L (ref 0–44)
AST: 31 U/L (ref 15–41)
Albumin: 3.8 g/dL (ref 3.5–5.0)
Alkaline Phosphatase: 67 U/L (ref 38–126)
Anion gap: 9 (ref 5–15)
BUN: 12 mg/dL (ref 6–20)
CO2: 22 mmol/L (ref 22–32)
Calcium: 8.7 mg/dL — ABNORMAL LOW (ref 8.9–10.3)
Chloride: 107 mmol/L (ref 98–111)
Creatinine, Ser: 0.68 mg/dL (ref 0.61–1.24)
GFR, Estimated: >60 mL/min (ref >60)
Glucose, Bld: 106 mg/dL — ABNORMAL HIGH (ref 70–99)
Potassium: 3.9 mmol/L (ref 3.5–5.1)
Sodium: 138 mmol/L (ref 135–145)
Total Bilirubin: 0.7 mg/dL (ref 0.0–1.2)
Total Protein: 6.5 g/dL (ref 6.5–8.1)

## 2023-12-12 LAB — LIPASE, BLOOD: Lipase: 41 U/L (ref 11–51)

## 2023-12-12 LAB — CBC
HCT: 39.5 % (ref 39.0–52.0)
Hemoglobin: 13.1 g/dL (ref 13.0–17.0)
MCH: 30.5 pg (ref 26.0–34.0)
MCHC: 33.2 g/dL (ref 30.0–36.0)
MCV: 91.9 fL (ref 80.0–100.0)
Platelets: 231 10*3/uL (ref 150–400)
RBC: 4.3 MIL/uL (ref 4.22–5.81)
RDW: 13.4 % (ref 11.5–15.5)
WBC: 6.5 10*3/uL (ref 4.0–10.5)
nRBC: 0 % (ref 0.0–0.2)

## 2023-12-12 MED ORDER — MORPHINE SULFATE (PF) 4 MG/ML IV SOLN
4.0000 mg | Freq: Once | INTRAVENOUS | Status: AC
  Administered 2023-12-12: 4 mg via INTRAVENOUS
  Filled 2023-12-12: qty 1

## 2023-12-12 MED ORDER — MAALOX MAX 400-400-40 MG/5ML PO SUSP
10.0000 mL | Freq: Four times a day (QID) | ORAL | 0 refills | Status: DC | PRN
  Filled 2023-12-12: qty 355, 9d supply, fill #0

## 2023-12-12 MED ORDER — LIDOCAINE VISCOUS HCL 2 % MT SOLN
15.0000 mL | Freq: Once | OROMUCOSAL | Status: AC
  Administered 2023-12-12: 15 mL via ORAL
  Filled 2023-12-12: qty 15

## 2023-12-12 MED ORDER — IOHEXOL 300 MG/ML  SOLN
100.0000 mL | Freq: Once | INTRAMUSCULAR | Status: AC | PRN
  Administered 2023-12-12: 100 mL via INTRAVENOUS

## 2023-12-12 MED ORDER — ALUM & MAG HYDROXIDE-SIMETH 200-200-20 MG/5ML PO SUSP
30.0000 mL | Freq: Once | ORAL | Status: AC
  Administered 2023-12-12: 30 mL via ORAL
  Filled 2023-12-12: qty 30

## 2023-12-12 NOTE — ED Provider Notes (Signed)
 [place] EMERGENCY DEPARTMENT AT [facility] Provider Note   CSN: [id number] Arrival date & time: 12/12/23  9085     Patient presents with: Abdominal Pain   Jimmy Fisher is a 43 y.o. male with history of HIV, polysubstance abuse, GERD, and hypertension who presents to the ED today for abdominal pain.  Patient endorses intermittent sharp upper abdominal pains for the past several months.  States that he has had ulcers in the past and thinks that this feels similar.  He has been taking omeprazole  and Carafate  without improvement.  He is scheduled to see GI in 2 months, but he feels like he cannot wait that long because of his pain.  Denies associated fevers, nausea, vomiting.  Does endorse daily diarrhea for the past several months as well. Denies associated chest pain or shortness of breath. No additional complaints or concerns at this time.    Prior to Admission medications   Medication Sig Start Date End Date Taking? Authorizing Provider  alum & mag hydroxide-simeth (MAALOX MAX) 400-400-40 MG/5ML suspension Take 10 mLs by mouth every 6 (six) hours as needed for indigestion. 12/12/23 01/11/24 Yes Waddell Sluder, PA-C  atorvastatin  (LIPITOR) 10 MG tablet Take 1 tablet (10 mg total) by mouth daily. 11/24/23   Pokhrel, Laxman, MD  bictegravir-emtricitabine -tenofovir  AF (BIKTARVY ) 50-200-25 MG TABS tablet Take 1 tablet by mouth daily. 11/24/23   Pokhrel, Laxman, MD  FLUoxetine  (PROZAC ) 40 MG capsule Take 1 capsule (40 mg total) by mouth daily. 11/24/23 11/23/24  Pokhrel, Laxman, MD  folic acid  (FOLVITE ) 1 MG tablet Take 1 tablet (1 mg total) by mouth daily. 11/25/23 03/04/24  Pokhrel, Laxman, MD  gabapentin  (NEURONTIN ) 300 MG capsule Take 1 capsule (300 mg total) by mouth 3 (three) times daily for 10 days. 11/24/23 12/05/23  Pokhrel, Laxman, MD  loratadine  (CLARITIN ) 10 MG tablet Take 1 tablet (10 mg total) by mouth daily for 5 days. 11/25/23 11/30/23  Pokhrel, Laxman, MD  Multiple Vitamin  (MULTIVITAMIN WITH MINERALS) TABS tablet Take 1 tablet by mouth daily. 11/24/23 03/04/24  Pokhrel, Vernal, MD  olmesartan  (BENICAR ) 20 MG tablet Take 1 tablet (20 mg total) by mouth daily. 11/24/23 07/31/24  Pokhrel, Vernal, MD  omeprazole  (PRILOSEC) 20 MG capsule Take 1 capsule (20 mg total) by mouth 2 (two) times daily. 11/24/23   Pokhrel, Vernal, MD  sucralfate  (CARAFATE ) 1 GM/10ML suspension Take 10 mLs (1 g total) by mouth 4 (four) times daily -  with meals and at bedtime. 12/06/23   Jolaine Pac, DO  thiamine  (VITAMIN B-1) 100 MG tablet Take 1 tablet (100 mg total) by mouth daily. 11/25/23 03/04/24  Pokhrel, Vernal, MD    Allergies: Doxycycline     Review of Systems  Gastrointestinal:  Positive for abdominal pain.  All other systems reviewed and are negative.   Updated Vital Signs BP (!) 166/90 (BP Location: Left Arm)   Pulse 90   Temp 97.7 F (36.5 C) (Oral)   Resp 18   SpO2 98%   Physical Exam Vitals and nursing note reviewed.  Constitutional:      General: He is not in acute distress.    Appearance: Normal appearance.  HENT:     Head: Normocephalic and atraumatic.     Mouth/Throat:     Mouth: Mucous membranes are moist.   Eyes:     Conjunctiva/sclera: Conjunctivae normal.     Pupils: Pupils are equal, round, and reactive to light.    Cardiovascular:     Rate and Rhythm:  Normal rate and regular rhythm.     Pulses: Normal pulses.     Heart sounds: Normal heart sounds.  Pulmonary:     Effort: Pulmonary effort is normal.     Breath sounds: Normal breath sounds.  Abdominal:     Palpations: Abdomen is soft.     Tenderness: There is abdominal tenderness. There is no right CVA tenderness or left CVA tenderness.     Comments: Epigastric TTP   Musculoskeletal:        General: Normal range of motion.     Cervical back: Normal range of motion.   Skin:    General: Skin is warm and dry.     Findings: No rash.   Neurological:     General: No focal deficit present.      Mental Status: He is alert.   Psychiatric:        Mood and Affect: Mood normal.        Behavior: Behavior normal.    (all labs ordered are listed, but only abnormal results are displayed) Labs Reviewed  COMPREHENSIVE METABOLIC PANEL WITH GFR - Abnormal; Notable for the following components:      Result Value   Glucose, Bld 106 (*)    Calcium  8.7 (*)    All other components within normal limits  LIPASE, BLOOD  CBC  URINALYSIS, ROUTINE W REFLEX MICROSCOPIC    EKG: None  Radiology: CT ABDOMEN PELVIS W CONTRAST Result Date: 12/12/2023 CLINICAL DATA:  Abdominal pain. EXAM: CT ABDOMEN AND PELVIS WITH CONTRAST TECHNIQUE: Multidetector CT imaging of the abdomen and pelvis was performed using the standard protocol following bolus administration of intravenous contrast. RADIATION DOSE REDUCTION: This exam was performed according to the departmental dose-optimization program which includes automated exposure control, adjustment of the mA and/or kV according to patient size and/or use of iterative reconstruction technique. CONTRAST:  100mL OMNIPAQUE IOHEXOL 300 MG/ML  SOLN COMPARISON:  CT abdomen pelvis dated 07/17/2018. FINDINGS: Lower chest: No acute abnormality. No intra-abdominal free air or free fluid. Hepatobiliary: Ill-defined area of heterogeneous enhancement in the right lobe of the liver and in segment IV B are not evaluated on this CT. There is no associated mass effect. These likely represent areas of delayed perfusion or scarring versus edema. Correlation with LFTs and further evaluation with ultrasound recommended. The gallbladder is unremarkable. Pancreas: Unremarkable. No pancreatic ductal dilatation or surrounding inflammatory changes. Spleen: Normal in size without focal abnormality. Adrenals/Urinary Tract: The adrenal glands unremarkable. The kidneys, visualized ureters, and urinary bladder appear unremarkable. Stomach/Bowel: There is no bowel obstruction or active inflammation. The  appendix is normal. Vascular/Lymphatic: The abdominal aorta and IVC unremarkable. No portal venous gas. There is no adenopathy. Reproductive: The prostate and seminal vesicles are grossly unremarkable. No pelvic mass Other: None Musculoskeletal: No acute or significant osseous findings. IMPRESSION: 1. No bowel obstruction. Normal appendix. 2. Heterogeneous enhancement in the liver likely delayed perfusion or scarring versus edema. Correlation with LFTs and further evaluation with ultrasound recommended. Electronically Signed   By: Vanetta Chou M.D.   On: 12/12/2023 13:26     Procedures   Medications Ordered in the ED  morphine  (PF) 4 MG/ML injection 4 mg (has no administration in time range)  alum & mag hydroxide-simeth (MAALOX/MYLANTA) 200-200-20 MG/5ML suspension 30 mL (30 mLs Oral Given 12/12/23 1028)    And  lidocaine  (XYLOCAINE ) 2 % viscous mouth solution 15 mL (15 mLs Oral Given 12/12/23 1028)  morphine  (PF) 4 MG/ML injection 4 mg (4 mg Intravenous  Given 12/12/23 1228)  iohexol (OMNIPAQUE) 300 MG/ML solution 100 mL (100 mLs Intravenous Contrast Given 12/12/23 1236)                                    Medical Decision Making Amount and/or Complexity of Data Reviewed Labs: ordered. Radiology: ordered.  Risk OTC drugs. Prescription drug management.   This patient presents to the ED for concern of abdominal pain, this involves an extensive number of treatment options, and is a complaint that carries with it a high risk of complications and morbidity.   Differential diagnosis includes: gastritis, gastroenteritis, PUD, pancreatitis, cholecystitis, cholangitis, etc.   Comorbidities  See HPI above   Additional History  Additional history obtained from prior records   Lab Tests  I ordered and personally interpreted labs.  The pertinent results include:   CMP, lipase, CBC unremarkable UA shows no signs of infection   Imaging Studies  I ordered imaging studies including  CT abdomen/pelvis  I independently visualized and interpreted imaging which showed:  No bowel obstruction.  Normal appendix. Heterogeneous spleen and liver, likely delayed.  Liver.  Correlation with these and further evaluation with ultrasound recommended. I agree with the radiologist interpretation   Problem List / ED Course / Critical Interventions / Medication Management  Patient reports sharp, stabbing epigastric pains for the past several months.  States that it feels like obtained when he had gastric ulcers.  He is taking omeprazole  and Carafate  without improvement of symptoms. He is scheduled to see GI in 2 months but states that he cannot live like this until then.  No associated fevers, nausea, vomiting.  I ordered medications including: Maalox for abdominal pain  Reevaluation of the patient after these medicines showed that the patient improved some. Morphine  then ordered - patient did not drive here today. Patient staffed with my attending, Dr. Lenor. Since patient's LFT's are unremarkable, will hold off on ordering liver U/S and he will follow up outpatient. He is agreeable with this plan. I have reviewed the patients home medicines and have made adjustments as needed   Social Determinants of Health  Access to healthcare   Test / Admission - Considered  Discussed findings with patient.  All questions answered He is stable and safe for discharge home. Return precautions given.    Final diagnoses:  Epigastric pain    ED Discharge Orders          Ordered    alum & mag hydroxide-simeth (MAALOX MAX) 400-400-40 MG/5ML suspension  Every 6 hours PRN        12/12/23 1402               Waddell Sluder, PA-C 12/12/23 1410    Lenor Hollering, MD 12/12/23 [phone]

## 2023-12-12 NOTE — Discharge Instructions (Addendum)
 Your labs and imaging are reassuring.  Sent prescription of Maalox to your pharmacy can take every 6 hours as needed in addition to Carafate  and omeprazole .  Maintain a bland diet to help with your pain.   I am unable to send an electronic referral to [facility] GI but I've provided the information to the office to try to make a sooner appointment.  Get help right away if: You cannot stop vomiting. Your pain is only in one part of the abdomen. Pain on the right side could be caused by appendicitis. You have bloody or black poop (stool), or poop that looks like tar. You have trouble breathing. You have chest pain.

## 2023-12-12 NOTE — ED Triage Notes (Signed)
 Pt reports increasing pain/GI symptoms from suspected ulcers. Can't get in to see GI for 2 months. Taking carafate , omeprazole  without relief

## 2023-12-12 NOTE — Telephone Encounter (Signed)
 Patient called and stated that he would like to be seen sooner due to him being in severe pain in the middle of his abdomin. Patient stated that he feels like he is being stabbed. Patient would like a call back to his mother number [phone].  Please advise.

## 2023-12-12 NOTE — Telephone Encounter (Signed)
Left message for patient mother to call back.

## 2023-12-13 NOTE — Telephone Encounter (Signed)
 Contacted patient's mother who states that patient is not with her. She is asked to have the patient call back if he still needs anything from us .

## 2023-12-13 NOTE — Progress Notes (Signed)
 Internal Medicine Clinic Attending  Case discussed with the resident at the time of the visit.  We reviewed the resident's history and exam and pertinent patient test results.  I agree with the assessment, diagnosis, and plan of care documented in the resident's note.

## 2023-12-14 ENCOUNTER — Ambulatory Visit: Payer: Self-pay | Admitting: Student

## 2023-12-14 NOTE — Progress Notes (Signed)
 CMP with improved AST and stable mildly elevated ALT. CBC WNL and Quantiferon Gold negative.

## 2023-12-15 ENCOUNTER — Other Ambulatory Visit: Payer: Self-pay

## 2023-12-15 ENCOUNTER — Other Ambulatory Visit (HOSPITAL_COMMUNITY): Payer: Self-pay

## 2023-12-15 ENCOUNTER — Ambulatory Visit (HOSPITAL_COMMUNITY)
Admission: EM | Admit: 2023-12-15 | Discharge: 2023-12-16 | Disposition: A | Discharge status: Other Acute Inpatient Hospital | Attending: Psychiatry | Admitting: Psychiatry

## 2023-12-15 ENCOUNTER — Encounter (HOSPITAL_COMMUNITY): Payer: Self-pay | Admitting: Emergency Medicine

## 2023-12-15 DIAGNOSIS — Z21 Asymptomatic human immunodeficiency virus [HIV] infection status: Secondary | ICD-10-CM | POA: Diagnosis not present

## 2023-12-15 DIAGNOSIS — F411 Generalized anxiety disorder: Secondary | ICD-10-CM | POA: Diagnosis not present

## 2023-12-15 DIAGNOSIS — F19950 Other psychoactive substance use, unspecified with psychoactive substance-induced psychotic disorder with delusions: Secondary | ICD-10-CM

## 2023-12-15 DIAGNOSIS — Z5900 Homelessness unspecified: Secondary | ICD-10-CM | POA: Insufficient documentation

## 2023-12-15 DIAGNOSIS — E7800 Pure hypercholesterolemia, unspecified: Secondary | ICD-10-CM | POA: Diagnosis not present

## 2023-12-15 DIAGNOSIS — K219 Gastro-esophageal reflux disease without esophagitis: Secondary | ICD-10-CM | POA: Diagnosis not present

## 2023-12-15 DIAGNOSIS — F19150 Other psychoactive substance abuse with psychoactive substance-induced psychotic disorder with delusions: Secondary | ICD-10-CM | POA: Diagnosis not present

## 2023-12-15 DIAGNOSIS — F332 Major depressive disorder, recurrent severe without psychotic features: Secondary | ICD-10-CM | POA: Diagnosis not present

## 2023-12-15 DIAGNOSIS — Z79899 Other long term (current) drug therapy: Secondary | ICD-10-CM | POA: Diagnosis not present

## 2023-12-15 DIAGNOSIS — F1910 Other psychoactive substance abuse, uncomplicated: Secondary | ICD-10-CM | POA: Diagnosis not present

## 2023-12-15 DIAGNOSIS — R45851 Suicidal ideations: Secondary | ICD-10-CM | POA: Diagnosis present

## 2023-12-15 DIAGNOSIS — I4510 Unspecified right bundle-branch block: Secondary | ICD-10-CM | POA: Insufficient documentation

## 2023-12-15 DIAGNOSIS — I10 Essential (primary) hypertension: Secondary | ICD-10-CM | POA: Insufficient documentation

## 2023-12-15 DIAGNOSIS — K589 Irritable bowel syndrome without diarrhea: Secondary | ICD-10-CM | POA: Insufficient documentation

## 2023-12-15 DIAGNOSIS — M797 Fibromyalgia: Secondary | ICD-10-CM | POA: Diagnosis not present

## 2023-12-15 LAB — COMPREHENSIVE METABOLIC PANEL WITH GFR
ALT: 100 U/L — ABNORMAL HIGH (ref 0–44)
AST: 213 U/L — ABNORMAL HIGH (ref 15–41)
Albumin: 4 g/dL (ref 3.5–5.0)
Alkaline Phosphatase: 64 U/L (ref 38–126)
Anion gap: 19 — ABNORMAL HIGH (ref 5–15)
BUN: 48 mg/dL — ABNORMAL HIGH (ref 6–20)
CO2: 19 mmol/L — ABNORMAL LOW (ref 22–32)
Calcium: 8.8 mg/dL — ABNORMAL LOW (ref 8.9–10.3)
Chloride: 97 mmol/L — ABNORMAL LOW (ref 98–111)
Creatinine, Ser: 1.95 mg/dL — ABNORMAL HIGH (ref 0.61–1.24)
GFR, Estimated: 42 mL/min — ABNORMAL LOW (ref >60)
Glucose, Bld: 90 mg/dL (ref 70–99)
Potassium: 2.9 mmol/L — ABNORMAL LOW (ref 3.5–5.1)
Sodium: 135 mmol/L (ref 135–145)
Total Bilirubin: 1.8 mg/dL — ABNORMAL HIGH (ref 0.0–1.2)
Total Protein: 6.7 g/dL (ref 6.5–8.1)

## 2023-12-15 LAB — LIPID PANEL
Cholesterol: 155 mg/dL (ref 0–200)
HDL: 38 mg/dL — ABNORMAL LOW (ref >40)
LDL Cholesterol: 86 mg/dL (ref 0–99)
Total CHOL/HDL Ratio: 4.1 ratio
Triglycerides: 153 mg/dL — ABNORMAL HIGH (ref <150)
VLDL: 31 mg/dL (ref 0–40)

## 2023-12-15 LAB — CBC WITH DIFFERENTIAL/PLATELET
Abs Immature Granulocytes: 0.06 10*3/uL (ref 0.00–0.07)
Basophils Absolute: 0.1 10*3/uL (ref 0.0–0.1)
Basophils Relative: 1 %
Eosinophils Absolute: 0.6 10*3/uL — ABNORMAL HIGH (ref 0.0–0.5)
Eosinophils Relative: 5 %
HCT: 39.7 % (ref 39.0–52.0)
Hemoglobin: 13.7 g/dL (ref 13.0–17.0)
Immature Granulocytes: 1 %
Lymphocytes Relative: 28 %
Lymphs Abs: 3.5 10*3/uL (ref 0.7–4.0)
MCH: 30.7 pg (ref 26.0–34.0)
MCHC: 34.5 g/dL (ref 30.0–36.0)
MCV: 89 fL (ref 80.0–100.0)
Monocytes Absolute: 1.4 10*3/uL — ABNORMAL HIGH (ref 0.1–1.0)
Monocytes Relative: 11 %
Neutro Abs: 7.2 10*3/uL (ref 1.7–7.7)
Neutrophils Relative %: 54 %
Platelets: 273 10*3/uL (ref 150–400)
RBC: 4.46 MIL/uL (ref 4.22–5.81)
RDW: 13.4 % (ref 11.5–15.5)
WBC: 12.8 10*3/uL — ABNORMAL HIGH (ref 4.0–10.5)
nRBC: 0 % (ref 0.0–0.2)

## 2023-12-15 LAB — POCT URINE DRUG SCREEN - MANUAL ENTRY (I-SCREEN)
POC Amphetamine UR: POSITIVE — AB
POC Buprenorphine (BUP): NOT DETECTED
POC Cocaine UR: NOT DETECTED
POC Marijuana UR: NOT DETECTED
POC Methadone UR: NOT DETECTED
POC Methamphetamine UR: NOT DETECTED
POC Morphine: NOT DETECTED
POC Oxazepam (BZO): POSITIVE — AB
POC Oxycodone UR: NOT DETECTED
POC Secobarbital (BAR): POSITIVE — AB

## 2023-12-15 LAB — HEMOGLOBIN A1C
Hgb A1c MFr Bld: 5.3 % (ref 4.8–5.6)
Mean Plasma Glucose: 105.41 mg/dL

## 2023-12-15 LAB — TSH: TSH: 3.372 u[IU]/mL (ref 0.350–4.500)

## 2023-12-15 LAB — MAGNESIUM: Magnesium: 2.3 mg/dL (ref 1.7–2.4)

## 2023-12-15 LAB — ETHANOL: Alcohol, Ethyl (B): 81 mg/dL — ABNORMAL HIGH (ref <15)

## 2023-12-15 MED ORDER — ADULT MULTIVITAMIN W/MINERALS CH: 1.0000 | ORAL_TABLET | Freq: Every day | ORAL | Status: DC

## 2023-12-15 MED ORDER — LORAZEPAM 2 MG/ML IJ SOLN: 2.0000 mg | Freq: Three times a day (TID) | INTRAMUSCULAR | Status: DC | PRN

## 2023-12-15 MED ORDER — ALUM & MAG HYDROXIDE-SIMETH 200-200-20 MG/5ML PO SUSP: 30.0000 mL | ORAL | Status: DC | PRN

## 2023-12-15 MED ORDER — PANTOPRAZOLE SODIUM 40 MG PO TBEC: 40.0000 mg | DELAYED_RELEASE_TABLET | Freq: Two times a day (BID) | ORAL | Status: DC

## 2023-12-15 MED ORDER — HALOPERIDOL LACTATE 5 MG/ML IJ SOLN: 5.0000 mg | Freq: Three times a day (TID) | INTRAMUSCULAR | Status: DC | PRN

## 2023-12-15 MED ORDER — NICOTINE 14 MG/24HR TD PT24: 14.0000 mg | MEDICATED_PATCH | Freq: Every day | TRANSDERMAL | Status: DC

## 2023-12-15 MED ORDER — LOPERAMIDE HCL 2 MG PO CAPS: 2.0000 mg | ORAL_CAPSULE | ORAL | Status: DC | PRN

## 2023-12-15 MED ORDER — ONDANSETRON 4 MG PO TBDP: 4.0000 mg | ORAL_TABLET | Freq: Four times a day (QID) | ORAL | Status: DC | PRN

## 2023-12-15 MED ORDER — HALOPERIDOL 5 MG PO TABS: 5.0000 mg | ORAL_TABLET | Freq: Three times a day (TID) | ORAL | Status: DC | PRN

## 2023-12-15 MED ORDER — DIPHENHYDRAMINE HCL 50 MG/ML IJ SOLN: 50.0000 mg | Freq: Three times a day (TID) | INTRAMUSCULAR | Status: DC | PRN

## 2023-12-15 MED ORDER — THIAMINE MONONITRATE 100 MG PO TABS: 100.0000 mg | ORAL_TABLET | Freq: Every day | ORAL | Status: DC

## 2023-12-15 MED ORDER — FLUOXETINE HCL 20 MG PO CAPS: 40.0000 mg | ORAL_CAPSULE | Freq: Every day | ORAL | Status: DC

## 2023-12-15 MED ORDER — MAGNESIUM HYDROXIDE 400 MG/5ML PO SUSP: 30.0000 mL | Freq: Every day | ORAL | Status: DC | PRN

## 2023-12-15 MED ORDER — ACETAMINOPHEN 325 MG PO TABS
650.0000 mg | ORAL_TABLET | Freq: Four times a day (QID) | ORAL | Status: DC | PRN
  Administered 2023-12-15: 650 mg via ORAL
  Filled 2023-12-15: qty 2

## 2023-12-15 MED ORDER — LORAZEPAM 1 MG PO TABS: 1.0000 mg | ORAL_TABLET | Freq: Four times a day (QID) | ORAL | Status: DC | PRN

## 2023-12-15 MED ORDER — HYDROXYZINE HCL 25 MG PO TABS
25.0000 mg | ORAL_TABLET | Freq: Three times a day (TID) | ORAL | Status: DC | PRN
  Administered 2023-12-15: 25 mg via ORAL
  Filled 2023-12-15: qty 1

## 2023-12-15 MED ORDER — SUCRALFATE 1 GM/10ML PO SUSP: 1.0000 g | Freq: Three times a day (TID) | ORAL | Status: DC

## 2023-12-15 MED ORDER — POTASSIUM CHLORIDE CRYS ER 20 MEQ PO TBCR
40.0000 meq | EXTENDED_RELEASE_TABLET | Freq: Once | ORAL | Status: AC
  Administered 2023-12-15: 40 meq via ORAL

## 2023-12-15 MED ORDER — POTASSIUM CHLORIDE CRYS ER 20 MEQ PO TBCR
40.0000 meq | EXTENDED_RELEASE_TABLET | Freq: Once | ORAL | Status: DC
  Filled 2023-12-15: qty 2

## 2023-12-15 MED ORDER — LOSARTAN POTASSIUM 50 MG PO TABS: 25.0000 mg | ORAL_TABLET | Freq: Every day | ORAL | Status: DC

## 2023-12-15 MED ORDER — BICTEGRAVIR-EMTRICITAB-TENOFOV 50-200-25 MG PO TABS: 1.0000 | ORAL_TABLET | Freq: Every day | ORAL | Status: DC

## 2023-12-15 MED ORDER — HALOPERIDOL LACTATE 5 MG/ML IJ SOLN: 10.0000 mg | Freq: Three times a day (TID) | INTRAMUSCULAR | Status: DC | PRN

## 2023-12-15 MED ORDER — ATORVASTATIN CALCIUM 10 MG PO TABS: 10.0000 mg | ORAL_TABLET | Freq: Every day | ORAL | Status: DC

## 2023-12-15 MED ORDER — LOSARTAN POTASSIUM 25 MG PO TABS
25.0000 mg | ORAL_TABLET | Freq: Every day | ORAL | 3 refills | Status: DC
  Filled 2023-12-15: qty 90, 90d supply, fill #0

## 2023-12-15 MED ORDER — DIPHENHYDRAMINE HCL 50 MG PO CAPS: 50.0000 mg | ORAL_CAPSULE | Freq: Three times a day (TID) | ORAL | Status: DC | PRN

## 2023-12-15 MED ORDER — FOLIC ACID 1 MG PO TABS: 1.0000 mg | ORAL_TABLET | Freq: Every day | ORAL | Status: DC

## 2023-12-15 NOTE — Progress Notes (Signed)
 Pt has been accepted to [facility] on 12/15/2023 . Bed assignment:402-2  Pt meets inpatient criteria per Alan Mcardle, NP   Attending Physician will be Dr. Prentis   Report can be called to: Adult unit: [phone]  Pt can arrive @ 2000   Care Team Notified: [facility] Bretta Qua, RN, Loetta Pinal, RN, Alan Mcardle, NP

## 2023-12-15 NOTE — Progress Notes (Signed)
 12/15/23 1505  [facility] Triage Screening (Walk-ins at [facility] only)  How Did You Hear About Us ? Family/Friend  What Is the Reason for Your Visit/Call Today? Pt is a 43 yo male who presented voluntarily accomapnied by his mother who was not present for the assessment by pt's choice. Pt stated that he has been on a 4 day meth/alcohol/barbituate binge since he says he was asked to leave [facility] 4 days ago. Pt stated that he has been binging for 4 days and his mother got in touch with him today and brought him to [facility] for detox. Pt stated I'm high right now. Pt seemed under the influence of a stimulant as he was moving excessively and talking excessively in pressured speech. Pt stated that he is suicidal and has attempted suicide many times with the last attempt 4-6 weeks ago when he spent the night niging in a tree with a rope trying to hang himself. Pt seemes religiously fixated as he stated that the Lord saved me becasue the rope kept getting twisted so I couldn't hang myself. Pt stated that he has been admitted to multiple psychiatric hospital multiple times. Pt stated that the last psychiatric stay was Christmasd 2023. Hx of MDD and GAD per pt. Pt stated that he is hopeless and beyond help. Pt also stated that he is tired of hurting people. Pt endorsed SI with a pan to overdose. Pt denied HI, NSSH and AVH. Pt was paranoid taking about a secretive co-op of people who were watching him and wanted to catch him in sexual acts. Pt stated that since he has HIV and is on a register that he is not supposed to hjave unprotected sex with anyone and he stated he is and has been. Pt stated that he has a court date on Monday for a felone charge of firearm possession by a felon. Pt stated he is hoping it is continued becasue he will be in rehab here.  How Long Has This Been Causing You Problems? > than 6 months  Have You Recently Had Any Thoughts About Hurting Yourself? Yes  How long ago did you have  thoughts about hurting yourself? today  Are You Planning to Commit Suicide/Harm Yourself At This time? Yes  Have you Recently Had Thoughts About Hurting Someone Sherral? No  Are You Planning To Harm Someone At This Time? No  Physical Abuse Denies  Verbal Abuse Denies  Sexual Abuse Denies  Exploitation of patient/patient's resources Denies  Self-Neglect Yes, present (Comment)  Are you currently experiencing any auditory, visual or other hallucinations? No (He did speak about seeing people in vests in a secret co-oh for sexual predators that was following and watching him;)  Have You Used Any Alcohol or Drugs in the Past 24 Hours? Yes  What Did You Use and How Much? today vodka, unknown amount  Do you have any current medical co-morbidities that require immediate attention? Yes (HIV, not on medications)  Please describe current medical co-morbidities that require immediate attention: HIV, not on any medications per pt  Clinician description of patient physical appearance/behavior: Pt was hyper-alert, coopertaive and seemed oriented. Pt described himself as being drunk/high now. Pt was casually dressed and disheveled and acting inapproprately by taking off his shoes, showing me his blistered feet and continually talking about his sexual, bi-sexual deviance. Pt was speaking in pressured speech and moving his body and face cintinually throughout the assessment.  What Do You Feel Would Help You the Most Today? Alcohol or  Drug Use Treatment;Treatment for Depression or other mood problem  If access to [facility] was not available, would you have sought care in the Emergency Department? Yes  Determination of Need Urgent (48 hours)  Options For Referral Facility-Based Crisis;Inpatient Hospitalization  Determination of Need filed? Yes
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
## 2023-12-15 NOTE — ED Provider Notes (Signed)
 Behavioral Health Urgent Care Medical Screening Exam  Patient Name: Jimmy Fisher MRN: [id number] Date of Evaluation: 12/15/23 Chief Complaint:  suicidal thoughts to overdose  Diagnosis:  Final diagnoses:  Severe episode of recurrent major depressive disorder, without psychotic features (HCC)  Substance-induced psychotic disorder with delusions (HCC)  Suicidal ideation  Polysubstance abuse (HCC)    History of Present illness: Jimmy Fisher 43 y.o., male patient presented to [facility] as a voluntary walk in accompanied by mother with complaints of suicidal ideations with a plan to overdose and being on a drug binge for the past 4 days. Jimmy Fisher, is seen face to face by this provider, consulted with Dr. Zouev; and chart reviewed on 12/15/23.  Per chart review patient has a past psychiatric history of MDD, GAD, polysubstance use including methamphetamines, opiates and alcohol.  He is currently being treated by his PCP with Prozac  40 mg daily for depression.  Last inpatient psychiatric hospitalization was in 2021 at [facility].  Medical history significant for HIV, GERD, fibromyalgia, IBS, hypertension and high cholesterol.  Patient had an I&D on his right elbow/antecubital area due to injection site infection from drug use about 1 month ago.  Patient reports antibiotic treatment was completed and denies having any pain.  The site does appear clean and dry.  On evaluation Jimmy Fisher reports that he was in a program at [facility] prior to receiving I&D of his arm where he was given pain medication in the hospital.  Patient reports that when he returned to the [facility], he was kicked out due to testing positive on his drug screen for this medication.  Patient states that he attempted to call his mother and his ex-wife for a ride or temporary place to stay, but they refused which caused him to feel very worthless, hopeless, sad and helpless.  He states that he is currently  homeless and about 4 days ago he began one of my binges .  Patient reports using benzos, alcohol, and crystal meth for the past 4 days.  He is unable to describe how much he is taking but states that he uses multiple times throughout the day.  Patient states that this has been a pattern for him for few years where he is sober for a while, then binges for few days.  He reports using all the substances today.  Patient denies history of any withdrawal symptoms stating that he does not use alcohol or drugs for long enough to begin withdrawing from them.  Patient reports that he also began having worsening depression and suicidal thoughts about 4 days ago, thought about overdosing on drugs today and also attempted to hang himself about 5 weeks ago with a rope but states the Lord saved me because the rope kept getting twisted up and I could not hang myself with it .  Patient reports history of inpatient psychiatric hospitalizations a couple of years ago.  Patient reports that he does have an upcoming court date on Monday for possession of a firearm.  Patient makes paranoid and delusional comments including there are people in a secretive co-op watching me and wanting to catch me in sexual acts because I am registered as unsafe to the community because I have HIV.  They review me as discussing and hate me .  Patient states that since on this drug been she has not slept in the past 3 to 4 days but that he does have a good appetite.  Discussed  with patient the recommendation for inpatient psychiatric hospitalization and patient agrees.  Patient also agrees to restart medications.  Patient denies having any acute pain or physical complaints.  During evaluation Jimmy Fisher is sitting down in assessment room, in no acute distress.  He is alert & oriented x 4, anxious and distracted but cooperative for this assessment.  His mood is anxious and depressed with congruent affect.  He has somewhat pressured speech  speech, restlessness, hyperactive body and facial movements.  Patient does present with psychotic symptoms including paranoia, delusions and persecutory thinking about people watching him, wanting to catch him, tapping into his phone and being disgusted by him. Patient is able to converse coherently however, does display circumstantial thought processing.  Patient continues to endorse having suicidal ideations with a plan to overdose on drugs today.  Patient also endorses feeling paranoid.  He currently denies homicidal ideation and hallucinations.  Patient answered question appropriately.    Flowsheet Row ED from 12/15/2023 in [facility] ED from 12/12/2023 in [facility] Emergency Department at [facility] ED to Hosp-Admission (Discharged) from 11/17/2023 in [place] [facility] 6 EAST ONCOLOGY  C-SSRS RISK CATEGORY High Risk No Risk Error: Q3, 4, or 5 should not be populated when Q2 is No    Psychiatric Specialty Exam  Presentation  General Appearance:Disheveled  Eye Contact:Fleeting  Speech:Pressured  Speech Volume:Normal  Handedness:Right   Mood and Affect  Mood: Depressed; Hopeless; Dysphoric; Worthless  Affect: Congruent; Depressed   Thought Process  Thought Processes: Coherent  Descriptions of Associations:Circumstantial  Orientation:Full (Time, Place and Person)  Thought Content:Paranoid Ideation; Scattered; Delusions  Diagnosis of Schizophrenia or Schizoaffective disorder in past: No  Duration of Psychotic Symptoms: Less than six months  Hallucinations:None  Ideas of Reference:Paranoia; Percusatory  Suicidal Thoughts:Yes, Passive With Plan; Without Intent  Homicidal Thoughts:No   Sensorium  Memory: Recent Fair; Immediate Good  Judgment: Fair  Insight: Lacking   Executive Functions  Concentration: Poor  Attention Span: Poor  Recall: [employer] of Knowledge: Fair  Language: Fair   Psychomotor Activity   Psychomotor Activity: Mannerisms; Restlessness; Increased   Assets  Assets: Communication Skills; Desire for Improvement; Financial Resources/Insurance; Resilience; Leisure Time   Sleep  Sleep: Poor  Number of hours:  1   Physical Exam: Physical Exam Vitals and nursing note reviewed.  Constitutional:      Appearance: Normal appearance.  HENT:     Head: Normocephalic.     Nose: Nose normal.  Eyes:     Extraocular Movements: Extraocular movements intact.  Cardiovascular:     Rate and Rhythm: Normal rate.  Pulmonary:     Effort: Pulmonary effort is normal.  Musculoskeletal:        General: Normal range of motion.     Cervical back: Normal range of motion.  Neurological:     General: No focal deficit present.     Mental Status: He is alert and oriented to person, place, and time.    Review of Systems  Constitutional: Negative.   HENT: Negative.    Eyes: Negative.   Respiratory: Negative.    Cardiovascular: Negative.   Gastrointestinal: Negative.   Genitourinary: Negative.   Musculoskeletal: Negative.   Neurological: Negative.   Endo/Heme/Allergies: Negative.   Psychiatric/Behavioral:  Positive for depression.    Blood pressure 97/70, pulse 95, temperature 98.7 F (37.1 C), temperature source Oral, resp. rate 20, SpO2 99%. There is no height or weight on file to calculate BMI.  Musculoskeletal:  Strength & Muscle Tone: within normal limits Gait & Station: normal Patient leans: N/A   [facility] MSE Discharge Disposition for Follow up and Recommendations: Based on my evaluation I certify that psychiatric inpatient services furnished can reasonably be expected to improve the patient's condition which I recommend transfer to an appropriate accepting facility.  Patient is recommended for inpatient psychiatric hospitalization for mood stabilization, detox and safety.  Patient agrees to inpatient treatment and remains voluntary.  Patient continues to endorse having  suicidal ideations with thoughts to overdose on drugs today.  Patient presented under the influence of substances and UDS was positive for benzodiazepines, barbiturates and amphetamines.  Without any documented history of psychosis, it is concluded that his paranoia and delusional thinking are related to his substance use.  Treatment plan: - Admit patient to continuous assessment unit until appropriate inpatient bed is found - Initiate CIWA's for alcohol withdrawal and benzodiazepine withdrawal (as needed Ativan ) - Initiate agitation protocol per policy - Continue home medications including: Meds ordered this encounter  Medications   acetaminophen  (TYLENOL ) tablet 650 mg   alum & mag hydroxide-simeth (MAALOX/MYLANTA) 200-200-20 MG/5ML suspension 30 mL   magnesium  hydroxide (MILK OF MAGNESIA) suspension 30 mL   AND Linked Order Group    haloperidol (HALDOL) tablet 5 mg    diphenhydrAMINE  (BENADRYL ) capsule 50 mg   AND Linked Order Group    haloperidol lactate (HALDOL) injection 5 mg    diphenhydrAMINE  (BENADRYL ) injection 50 mg    LORazepam  (ATIVAN ) injection 2 mg   AND Linked Order Group    haloperidol lactate (HALDOL) injection 10 mg    diphenhydrAMINE  (BENADRYL ) injection 50 mg    LORazepam  (ATIVAN ) injection 2 mg   hydrOXYzine  (ATARAX ) tablet 25 mg   nicotine  (NICODERM CQ  - dosed in mg/24 hours) patch 14 mg   atorvastatin  (LIPITOR) tablet 10 mg   bictegravir-emtricitabine -tenofovir  AF (BIKTARVY ) 50-200-25 MG per tablet 1 tablet   FLUoxetine  (PROZAC ) capsule 40 mg   folic acid  (FOLVITE ) tablet 1 mg   losartan  (COZAAR ) tablet 25 mg   multivitamin with minerals tablet 1 tablet   pantoprazole  (PROTONIX ) EC tablet 40 mg   sucralfate  (CARAFATE ) 1 GM/10ML suspension 1 g   thiamine  (VITAMIN B1) tablet 100 mg   LORazepam  (ATIVAN ) tablet 1 mg   loperamide  (IMODIUM ) capsule 2-4 mg   ondansetron  (ZOFRAN -ODT) disintegrating tablet 4 mg   - Labs, UDS and EKG ordered (EKG did display some QT  prolongation likely related to recent stimulant and benzo use.  Patient denies any palpitations or chest pain.  Vital signs stable.) Lab Orders         CBC with Differential/Platelet         Comprehensive metabolic panel         Hemoglobin A1c         Magnesium          Ethanol         Lipid panel         TSH         POCT Urine Drug Screen - (I-Screen)     EKG  Disposition: Patient is accepted to [facility] for inpatient hospitalization.  Alan JAYSON Mcardle, NP 12/15/2023, 6:03 PM

## 2023-12-15 NOTE — ED Notes (Signed)
 Pt admitted to observation unit to be transferred to [facility] pending labs due to meth abuse. Pt with involuntary movements to head and arms noted. Pt reports using meth today prior to entering facility. Pt request detox and then be sent to [facility] for residential tx. 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 I found out 4 years ago. Cooperative with staff. Denies SI/HI/AVH currently. Will monitor for safety.

## 2023-12-15 NOTE — ED Notes (Signed)
 Pt is lying in bed restless d/t to substance withdrawal at this hour. No apparent distress. RR even and unlabored. Monitored for safety.

## 2023-12-15 NOTE — Progress Notes (Signed)
   12/15/23 1544  [place] Suicide Severity Rating Scale  1. In the past month -  Have you wished you were dead or wished you could go to sleep and not wake up? Yes  2. In the past month - Have you actually had any thoughts of killing yourself? Yes  3. In the past month - Have you been thinking about how you might kill yourself? Yes  4. In the past month - Have you had these thoughts and had some intention of acting on them? Yes  5. In the past month - Have you started to work out or worked out the details of how to kill yourself? Do you intend to carry out this plan? Yes  6. Have you ever done anything, started to do anything, or prepared to do anything to end your life? Yes  7. Was this within the past three months? Yes  C-SSRS RISK CATEGORY High Risk  Patient location: [facility] Urgent Care/Facility Based Crisis Center  BH Urgent Care/Facility Based Crisis Center Suicide Precautions Interventions  [facility] Suicide Precautions Interventions High Risk Interventions

## 2023-12-15 NOTE — Discharge Instructions (Signed)
 Pt transferred to [facility] for inpatient treatment.

## 2023-12-16 ENCOUNTER — Inpatient Hospital Stay (HOSPITAL_COMMUNITY)
Admission: AD | Admit: 2023-12-16 | Discharge: 2023-12-21 | DRG: 885 | Disposition: A | Discharge status: Home / Self Care | Source: Intra-hospital

## 2023-12-16 ENCOUNTER — Other Ambulatory Visit: Payer: Self-pay

## 2023-12-16 ENCOUNTER — Encounter (HOSPITAL_COMMUNITY): Payer: Self-pay

## 2023-12-16 ENCOUNTER — Encounter (HOSPITAL_COMMUNITY): Payer: Self-pay | Admitting: Psychiatry

## 2023-12-16 DIAGNOSIS — F15259 Other stimulant dependence with stimulant-induced psychotic disorder, unspecified: Secondary | ICD-10-CM | POA: Diagnosis present

## 2023-12-16 DIAGNOSIS — F339 Major depressive disorder, recurrent, unspecified: Secondary | ICD-10-CM

## 2023-12-16 DIAGNOSIS — R45851 Suicidal ideations: Secondary | ICD-10-CM | POA: Diagnosis present

## 2023-12-16 DIAGNOSIS — F1320 Sedative, hypnotic or anxiolytic dependence, uncomplicated: Secondary | ICD-10-CM | POA: Diagnosis present

## 2023-12-16 DIAGNOSIS — F1994 Other psychoactive substance use, unspecified with psychoactive substance-induced mood disorder: Secondary | ICD-10-CM | POA: Diagnosis not present

## 2023-12-16 DIAGNOSIS — F332 Major depressive disorder, recurrent severe without psychotic features: Secondary | ICD-10-CM | POA: Diagnosis present

## 2023-12-16 DIAGNOSIS — R45850 Homicidal ideations: Secondary | ICD-10-CM | POA: Diagnosis not present

## 2023-12-16 DIAGNOSIS — E782 Mixed hyperlipidemia: Secondary | ICD-10-CM

## 2023-12-16 DIAGNOSIS — Z9152 Personal history of nonsuicidal self-harm: Secondary | ICD-10-CM

## 2023-12-16 DIAGNOSIS — E7800 Pure hypercholesterolemia, unspecified: Secondary | ICD-10-CM | POA: Diagnosis present

## 2023-12-16 DIAGNOSIS — E669 Obesity, unspecified: Secondary | ICD-10-CM | POA: Diagnosis present

## 2023-12-16 DIAGNOSIS — K219 Gastro-esophageal reflux disease without esophagitis: Secondary | ICD-10-CM | POA: Diagnosis present

## 2023-12-16 DIAGNOSIS — F1090 Alcohol use, unspecified, uncomplicated: Secondary | ICD-10-CM | POA: Diagnosis present

## 2023-12-16 DIAGNOSIS — I10 Essential (primary) hypertension: Secondary | ICD-10-CM | POA: Diagnosis present

## 2023-12-16 DIAGNOSIS — B20 Human immunodeficiency virus [HIV] disease: Secondary | ICD-10-CM | POA: Diagnosis present

## 2023-12-16 DIAGNOSIS — Z5941 Food insecurity: Secondary | ICD-10-CM

## 2023-12-16 DIAGNOSIS — F17290 Nicotine dependence, other tobacco product, uncomplicated: Secondary | ICD-10-CM | POA: Diagnosis present

## 2023-12-16 DIAGNOSIS — Z79899 Other long term (current) drug therapy: Secondary | ICD-10-CM | POA: Diagnosis not present

## 2023-12-16 DIAGNOSIS — F411 Generalized anxiety disorder: Secondary | ICD-10-CM | POA: Diagnosis present

## 2023-12-16 DIAGNOSIS — F1124 Opioid dependence with opioid-induced mood disorder: Secondary | ICD-10-CM | POA: Diagnosis present

## 2023-12-16 DIAGNOSIS — F19959 Other psychoactive substance use, unspecified with psychoactive substance-induced psychotic disorder, unspecified: Secondary | ICD-10-CM | POA: Insufficient documentation

## 2023-12-16 DIAGNOSIS — F1390 Sedative, hypnotic, or anxiolytic use, unspecified, uncomplicated: Secondary | ICD-10-CM | POA: Insufficient documentation

## 2023-12-16 DIAGNOSIS — Z9151 Personal history of suicidal behavior: Secondary | ICD-10-CM

## 2023-12-16 DIAGNOSIS — F1020 Alcohol dependence, uncomplicated: Secondary | ICD-10-CM | POA: Diagnosis present

## 2023-12-16 DIAGNOSIS — Z6830 Body mass index (BMI) 30.0-30.9, adult: Secondary | ICD-10-CM

## 2023-12-16 DIAGNOSIS — F1590 Other stimulant use, unspecified, uncomplicated: Secondary | ICD-10-CM | POA: Insufficient documentation

## 2023-12-16 DIAGNOSIS — Z5900 Homelessness unspecified: Secondary | ICD-10-CM

## 2023-12-16 LAB — POTASSIUM: Potassium: 3.4 mmol/L — ABNORMAL LOW (ref 3.5–5.1)

## 2023-12-16 MED ORDER — BICTEGRAVIR-EMTRICITAB-TENOFOV 50-200-25 MG PO TABS
1.0000 | ORAL_TABLET | Freq: Every day | ORAL | Status: DC
  Administered 2023-12-16 – 2023-12-21 (×6): 1 via ORAL
  Filled 2023-12-16 (×6): qty 1

## 2023-12-16 MED ORDER — ATORVASTATIN CALCIUM 10 MG PO TABS
10.0000 mg | ORAL_TABLET | Freq: Every day | ORAL | Status: DC
Start: 2023-12-16 — End: 2023-12-21
  Administered 2023-12-16 – 2023-12-20 (×5): 10 mg via ORAL
  Filled 2023-12-16 (×6): qty 1

## 2023-12-16 MED ORDER — POTASSIUM CHLORIDE CRYS ER 20 MEQ PO TBCR
40.0000 meq | EXTENDED_RELEASE_TABLET | Freq: Once | ORAL | Status: AC
  Administered 2023-12-16: 40 meq via ORAL
  Filled 2023-12-16: qty 2

## 2023-12-16 MED ORDER — SUCRALFATE 1 GM/10ML PO SUSP
1.0000 g | Freq: Three times a day (TID) | ORAL | Status: DC
  Administered 2023-12-16 – 2023-12-21 (×15): 1 g via ORAL
  Filled 2023-12-16 (×15): qty 10

## 2023-12-16 MED ORDER — NICOTINE 21 MG/24HR TD PT24
21.0000 mg | MEDICATED_PATCH | Freq: Every day | TRANSDERMAL | Status: DC
  Administered 2023-12-16 – 2023-12-17 (×2): 21 mg via TRANSDERMAL
  Filled 2023-12-16: qty 1

## 2023-12-16 MED ORDER — DIPHENHYDRAMINE HCL 50 MG/ML IJ SOLN: 50.0000 mg | Freq: Three times a day (TID) | INTRAMUSCULAR | Status: DC | PRN

## 2023-12-16 MED ORDER — HALOPERIDOL 5 MG PO TABS: 5.0000 mg | ORAL_TABLET | Freq: Three times a day (TID) | ORAL | Status: DC | PRN

## 2023-12-16 MED ORDER — FOLIC ACID 1 MG PO TABS
1.0000 mg | ORAL_TABLET | Freq: Every day | ORAL | Status: DC
  Administered 2023-12-16 – 2023-12-20 (×5): 1 mg via ORAL
  Filled 2023-12-16 (×6): qty 1

## 2023-12-16 MED ORDER — ONDANSETRON 4 MG PO TBDP
4.0000 mg | ORAL_TABLET | Freq: Four times a day (QID) | ORAL | Status: AC | PRN
  Administered 2023-12-18: 4 mg via ORAL
  Filled 2023-12-16: qty 1

## 2023-12-16 MED ORDER — PANTOPRAZOLE SODIUM 40 MG PO TBEC
40.0000 mg | DELAYED_RELEASE_TABLET | Freq: Two times a day (BID) | ORAL | Status: DC
  Administered 2023-12-16 – 2023-12-21 (×10): 40 mg via ORAL
  Filled 2023-12-16 (×10): qty 1

## 2023-12-16 MED ORDER — ADULT MULTIVITAMIN W/MINERALS CH
1.0000 | ORAL_TABLET | Freq: Every day | ORAL | Status: DC
  Administered 2023-12-16 – 2023-12-20 (×5): 1 via ORAL
  Filled 2023-12-16 (×6): qty 1

## 2023-12-16 MED ORDER — LORAZEPAM 2 MG/ML IJ SOLN: 2.0000 mg | Freq: Three times a day (TID) | INTRAMUSCULAR | Status: DC | PRN

## 2023-12-16 MED ORDER — LOPERAMIDE HCL 2 MG PO CAPS: 2.0000 mg | ORAL_CAPSULE | ORAL | Status: AC | PRN

## 2023-12-16 MED ORDER — LOSARTAN POTASSIUM 25 MG PO TABS
25.0000 mg | ORAL_TABLET | Freq: Every day | ORAL | Status: DC
  Administered 2023-12-16 – 2023-12-20 (×5): 25 mg via ORAL
  Filled 2023-12-16 (×6): qty 1

## 2023-12-16 MED ORDER — HYDROXYZINE HCL 25 MG PO TABS
25.0000 mg | ORAL_TABLET | Freq: Four times a day (QID) | ORAL | Status: AC | PRN
  Administered 2023-12-17 – 2023-12-18 (×2): 25 mg via ORAL
  Filled 2023-12-16 (×2): qty 1

## 2023-12-16 MED ORDER — ALUM & MAG HYDROXIDE-SIMETH 200-200-20 MG/5ML PO SUSP: 30.0000 mL | ORAL | Status: DC | PRN

## 2023-12-16 MED ORDER — ACETAMINOPHEN 325 MG PO TABS
650.0000 mg | ORAL_TABLET | Freq: Four times a day (QID) | ORAL | Status: DC | PRN
  Administered 2023-12-16 – 2023-12-20 (×6): 650 mg via ORAL
  Filled 2023-12-16 (×5): qty 2

## 2023-12-16 MED ORDER — HALOPERIDOL LACTATE 5 MG/ML IJ SOLN: 10.0000 mg | Freq: Three times a day (TID) | INTRAMUSCULAR | Status: DC | PRN

## 2023-12-16 MED ORDER — HALOPERIDOL LACTATE 5 MG/ML IJ SOLN: 5.0000 mg | Freq: Three times a day (TID) | INTRAMUSCULAR | Status: DC | PRN

## 2023-12-16 MED ORDER — LORAZEPAM 1 MG PO TABS: 1.0000 mg | ORAL_TABLET | Freq: Four times a day (QID) | ORAL | Status: AC | PRN

## 2023-12-16 MED ORDER — VITAMIN B-1 100 MG PO TABS
100.0000 mg | ORAL_TABLET | Freq: Every day | ORAL | Status: DC
  Administered 2023-12-16 – 2023-12-20 (×5): 100 mg via ORAL
  Filled 2023-12-16 (×6): qty 1

## 2023-12-16 MED ORDER — FLUOXETINE HCL 20 MG PO CAPS
40.0000 mg | ORAL_CAPSULE | Freq: Every day | ORAL | Status: DC
  Administered 2023-12-16 – 2023-12-20 (×5): 40 mg via ORAL
  Filled 2023-12-16 (×6): qty 2

## 2023-12-16 MED ORDER — DIPHENHYDRAMINE HCL 25 MG PO CAPS: 50.0000 mg | ORAL_CAPSULE | Freq: Three times a day (TID) | ORAL | Status: DC | PRN

## 2023-12-16 MED ORDER — ACETAMINOPHEN 325 MG PO TABS
ORAL_TABLET | ORAL | Status: AC
  Filled 2023-12-16: qty 1

## 2023-12-16 MED ORDER — WHITE PETROLATUM EX OINT
TOPICAL_OINTMENT | CUTANEOUS | Status: AC
  Filled 2023-12-16: qty 5

## 2023-12-16 NOTE — [facility] Suicide Risk Assessment (Signed)
 [facility] INPATIENT:  Family/Significant Other Suicide Prevention Education  Suicide Prevention Education:  Patient Refusal for Family/Significant Other Suicide Prevention Education: The patient Jimmy Fisher has refused to provide written consent for family/significant other to be provided Family/Significant Other Suicide Prevention Education during admission and/or prior to discharge.  Physician notified.  Sabel Hornbeck R 12/16/2023, 1:34 PM

## 2023-12-16 NOTE — [facility] Suicide Risk Assessment (Signed)
 [facility] Admission Suicide Risk Assessment   Nursing information obtained from:  Patient Demographic factors:  Male, Caucasian, Low socioeconomic status, Unemployed Current Mental Status:  Self-harm thoughts Loss Factors:  Financial problems / change in socioeconomic status Historical Factors:  NA Risk Reduction Factors:  NA  Total Time spent with patient: 1 hour Principal Problem: MDD (major depressive disorder), recurrent episode, severe (HCC) Diagnosis:  Principal Problem:   MDD (major depressive disorder), recurrent episode, severe (HCC) Active Problems:   Opioid dependence with opioid-induced mood disorder (HCC)   HIV disease (HCC)   Alcohol use disorder   Sedative, hypnotic or anxiolytic use disorder, moderate, in controlled environment   Stimulant use disorder   Substance-induced psychotic disorder (HCC)   Subjective Data:  Jimmy Fisher is a 43 y.o. male with a PPHx of MDD, anxiety, AUD, and stimulant use d/o meth type admitted to [facility] following a 4 day binge of methamphetamine after being discharged from [facility] and becoming homeless.    Patient endorses having approximately 4-day binge of methamphetamine after [facility] due to being tested positive for opiates despite it being appropriately positive after going to the ED for abdominal pain and receiving morphine .  Patient became dysphoric and he relapsed on methamphetamine although cites using all the substances including benzodiazepines, alcohol, and crystal meth patient was unable to quantify how much she has been using in the past few days but reports significant use throughout the day.  Patient reports recent suicide attempt approximately 5 weeks ago where he attempted to stab himself but the Lord saved me because the rope kept getting twisted up and I could not hang myself with it .  He recently has been having suicidal ideation which he suggests resulted in his substance binge.  He does  have an upcoming court date on Monday for possession of a firearm.  While at the behavioral health urgent care, he was making psychotic remarks including there are people in a secretive co-op watching me and wanting to catch me in sexual acts because I am registered as unsafe to the community because I have HIV.  They review me as discussing and hate me  likely in the setting of methamphetamine abuse.  He reports presently not experiencing any SI/HI/AVH.  Patient is able to contract for safety while on the unit.    Endorses symptoms of depressed mood, anhedonia, poor sleep, poor energy, poor concentration, and recent suicidal ideation.  Continued Clinical Symptoms:  Alcohol Use Disorder Identification Test Final Score (AUDIT): 34 The Alcohol Use Disorders Identification Test, Guidelines for Use in Primary Care, Second Edition.  World Science writer [facility]). Score between 0-7:  no or low risk or alcohol related problems. Score between 8-15:  moderate risk of alcohol related problems. Score between 16-19:  high risk of alcohol related problems. Score 20 or above:  warrants further diagnostic evaluation for alcohol dependence and treatment.   CLINICAL FACTORS:   Severe Anxiety and/or Agitation Alcohol/Substance Abuse/Dependencies More than one psychiatric diagnosis Previous Psychiatric Diagnoses and Treatments Medical Diagnoses and Treatments/Surgeries   Musculoskeletal: Strength & Muscle Tone: within normal limits Gait & Station: normal Patient leans: N/A  Psychiatric Specialty Exam:  Presentation  General Appearance:  Disheveled   Eye Contact: Fleeting   Speech: Pressured   Speech Volume: Normal   Handedness: Right   Mood and Affect  Mood: Depressed; Hopeless; Dysphoric; Worthless   Affect: Congruent; Depressed    Thought Process  Thought Processes: Coherent  Descriptions of Associations:Circumstantial   Orientation:Full (Time, Place and  Person)   Thought Content:Paranoid Ideation; Scattered; Delusions   History of Schizophrenia/Schizoaffective disorder:No   Duration of Psychotic Symptoms:Less than six months   Hallucinations:Hallucinations: None   Ideas of Reference:Paranoia; Percusatory   Suicidal Thoughts:Suicidal Thoughts: Yes, Passive SI Passive Intent and/or Plan: With Plan; Without Intent   Homicidal Thoughts:Homicidal Thoughts: No    Sensorium  Memory: Recent Fair; Immediate Good   Judgment: Fair   Insight: Lacking    Executive Functions  Concentration: Poor   Attention Span: Poor   Recall: [employer] of Knowledge: Fair   Language: Fair    Psychomotor Activity  Psychomotor Activity: Psychomotor Activity: Mannerisms; Restlessness; Increased    Assets  Assets: Communication Skills; Desire for Improvement; Financial Resources/Insurance; Resilience; Leisure Time    Sleep  Sleep: Sleep: Poor Number of Hours of Sleep: 1     Physical Exam: Physical Exam ROS Blood pressure 106/75, pulse 90, temperature 97.8 F (36.6 C), temperature source Oral, resp. rate 20, height 5' 6 (1.676 m), weight 84.5 kg, SpO2 97%. Body mass index is 30.05 kg/m.   COGNITIVE FEATURES THAT CONTRIBUTE TO RISK:  None    SUICIDE RISK:   Minimal: No identifiable suicidal ideation.  Patients presenting with no risk factors but with morbid ruminations; may be classified as minimal risk based on the severity of the depressive symptoms  PLAN OF CARE: see H&P  I certify that inpatient services furnished can reasonably be expected to improve the patient's condition.   Prentice Espy, MD 12/16/2023, 3:53 PM

## 2023-12-16 NOTE — Progress Notes (Signed)
   12/16/23 1500  Psych Admission Type (Psych Patients Only)  Admission Status Voluntary  Psychosocial Assessment  Patient Complaints Anxiety;Depression;Substance abuse  Eye Contact Fair  Facial Expression Flat  Affect Flat  Speech Logical/coherent  Interaction Assertive  Motor Activity Slow  Appearance/Hygiene In scrubs  Behavior Characteristics Appropriate to situation  Mood Depressed;Anxious  Thought Process  Coherency WDL  Content WDL  Delusions None reported or observed  Perception WDL  Hallucination None reported or observed  Judgment Poor  Confusion None  Danger to Self  Current suicidal ideation? Denies  Agreement Not to Harm Self Yes  Description of Agreement Verbal  Danger to Others  Danger to Others None reported or observed

## 2023-12-16 NOTE — Tx Team (Signed)
 Initial Treatment Plan 12/16/2023 5:32 AM [name] Sous FMW:[id number]    PATIENT STRESSORS: Financial difficulties   Substance abuse     PATIENT STRENGTHS: Motivation for treatment/growth  Physical Health    PATIENT IDENTIFIED PROBLEMS: Relapsing on substances                     DISCHARGE CRITERIA:  Improved stabilization in mood, thinking, and/or behavior Motivation to continue treatment in a less acute level of care Verbal commitment to aftercare and medication compliance Withdrawal symptoms are absent or subacute and managed without 24-hour nursing intervention  PRELIMINARY DISCHARGE PLAN: Outpatient therapy Placement in alternative living arrangements  PATIENT/FAMILY INVOLVEMENT: This treatment plan has been presented to and reviewed with the patient, Jimmy Fisher. The patient  has been given the opportunity to ask questions and make suggestions.  Siddiq Kaluzny, RN 12/16/2023, 5:32 AM

## 2023-12-16 NOTE — Group Note (Signed)
 Date:  12/16/2023 Time:  1:18 PM  Group Topic/Focus:  Identifying Needs:   The focus of this group is to help patients identify their personal needs that have been historically problematic and identify healthy behaviors to address their needs.    Participation Level:  Did Not Attend   Jimmy Fisher 12/16/2023, 1:18 PM

## 2023-12-16 NOTE — H&P (Signed)
 Psychiatric Admission Assessment Adult  Patient Identification: Jimmy Fisher MRN:  [id number] Date of Evaluation:  12/16/2023  Chief Complaint:  MDD (major depressive disorder), recurrent episode, severe (HCC) [F33.2],  MDD (major depressive disorder), recurrent episode, severe (HCC)  Principal Problem:   MDD (major depressive disorder), recurrent episode, severe (HCC) Active Problems:   Opioid dependence with opioid-induced mood disorder (HCC)   HIV disease (HCC)   Alcohol use disorder   Sedative, hypnotic or anxiolytic use disorder, moderate, in controlled environment   Stimulant use disorder   Substance-induced psychotic disorder (HCC)   History of Present Illness:  Jimmy Fisher is a 43 y.o. male with a PPHx of MDD, anxiety, AUD, and stimulant use d/o meth type admitted to [facility] following a 4 day binge of methamphetamine after being discharged from [facility] and becoming homeless.   Patient endorses having approximately 4-day binge of methamphetamine after being kicked out of [facility] due to being tested positive for opiates despite it being appropriately positive after going to the ED for abdominal pain and receiving morphine .  Patient became dysphoric and he relapsed on methamphetamine although cites using all the substances including benzodiazepines, alcohol, and crystal meth patient was unable to quantify how much she has been using in the past few days but reports significant use throughout the day.  Patient reports recent suicide attempt approximately 5 weeks ago where he attempted to stab himself but the Lord saved me because the rope kept getting twisted up and I could not hang myself with it .  He recently has been having suicidal ideation which he suggests resulted in his substance binge.  He does have an upcoming court date on Monday for possession of a firearm.  While at the behavioral health urgent care, he was making psychotic remarks including there are  people in a secretive co-op watching me and wanting to catch me in sexual acts because I am registered as unsafe to the community because I have HIV.  They review me as discussing and hate me  likely in the setting of methamphetamine abuse.  He reports presently not experiencing any SI/HI/AVH.  Patient is able to contract for safety while on the unit.   Endorses symptoms of depressed mood, anhedonia, poor sleep, poor energy, poor concentration, and recent suicidal ideation.  Psychiatric History:  Information collected from patient and chart.   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 He denies history of withdrawal AVH, seizures, and DT. He reports using meth daily for past 4 days.    Is the patient at risk to self? Yes Has the patient been a risk to self in the past 6 months? Yes Has the patient been a risk to self within the distant past? Yes Is the patient a risk to others? No Has the patient been a risk to others in the past 6 months? No Has the patient been a risk to others within the distant past? No  Alcohol Screening: 1. How often do you have a drink containing  alcohol?: 4 or more times a week 2. How many drinks containing alcohol do you have on a typical day when you are drinking?: 10 or more 3. How often do you have six or more drinks on one occasion?: Daily or almost daily AUDIT-C Score: 12 4. How often during the last year have you found that you were not able to stop drinking  once you had started?: Daily or almost daily 5. How often during the last year have you failed to do what was normally expected from you because of drinking?: Daily or almost daily 6. How often during the last year have you needed a first drink in the morning to get yourself going after a heavy drinking session?: Weekly 7. How often during the last year have you had a feeling of guilt of remorse after drinking?: Daily or almost daily 8. How often during the last year have you been unable to remember what happened the night before because you had been drinking?: Less than monthly 9. Have you or someone else been injured as a result of your drinking?: Yes, but not in the last year 10. Has a relative or friend or a doctor or another health worker been concerned about your drinking or suggested you cut down?: Yes, during the last year Alcohol Use Disorder Identification Test Final Score (AUDIT): 34 Alcohol Brief Interventions/Follow-up: Alcohol education/Brief advice Tobacco Screening:    Substance Abuse History in the last 12 months: Yes  Allergies:  Allergies  Allergen Reactions   Doxycycline  Rash    ? 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   Neuromuscular disorder (HCC)    fibromyalgia   Pulmonary embolism (HCC)    2012   SLAP tear of shoulder    left   Urticarial rash 12/05/2022   Vaccine counseling 05/09/2023    Family History:  Family History  Problem Relation Age of Onset   Fibromyalgia Mother    Cancer Father      Lab Results:  Results for  orders placed or performed during the hospital encounter of 12/15/23 (from the past 48 hours)  CBC with Differential/Platelet     Status: Abnormal   Collection Time: 12/15/23  5:16 PM  Result Value Ref Range   WBC 12.8 (H) 4.0 - 10.5 K/uL   RBC 4.46 4.22 - 5.81 MIL/uL   Hemoglobin 13.7 13.0 - 17.0 g/dL   HCT 60.2 60.9 - 47.9 %   MCV 89.0 80.0 - 100.0 fL   MCH 30.7 26.0 - 34.0 pg   MCHC 34.5 30.0 - 36.0 g/dL   RDW 86.5 88.4 - 84.4 %   Platelets 273 150 - 400 K/uL   nRBC 0.0 0.0 - 0.2 %   Neutrophils Relative % 54 %   Neutro Abs 7.2 1.7 - 7.7 K/uL   Lymphocytes Relative 28 %   Lymphs Abs 3.5 0.7 - 4.0 K/uL   Monocytes Relative 11 %   Monocytes Absolute 1.4 (H) 0.1 - 1.0 K/uL   Eosinophils Relative 5 %  Eosinophils Absolute 0.6 (H) 0.0 - 0.5 K/uL   Basophils Relative 1 %   Basophils Absolute 0.1 0.0 - 0.1 K/uL   Immature Granulocytes 1 %   Abs Immature Granulocytes 0.06 0.00 - 0.07 K/uL    Comment: Performed at [facility], [address] [place], [place] [address]  Comprehensive metabolic panel     Status: Abnormal   Collection Time: 12/15/23  5:16 PM  Result Value Ref Range   Sodium 135 135 - 145 mmol/L   Potassium 2.9 (L) 3.5 - 5.1 mmol/L   Chloride 97 (L) 98 - 111 mmol/L   CO2 19 (L) 22 - 32 mmol/L   Glucose, Bld 90 70 - 99 mg/dL    Comment: Glucose reference range applies only to samples taken after fasting for at least 8 hours.   BUN 48 (H) 6 - 20 mg/dL   Creatinine, Ser 8.04 (H) 0.61 - 1.24 mg/dL   Calcium  8.8 (L) 8.9 - 10.3 mg/dL   Total Protein 6.7 6.5 - 8.1 g/dL   Albumin 4.0 3.5 - 5.0 g/dL   AST 786 (H) 15 - 41 U/L   ALT 100 (H) 0 - 44 U/L   Alkaline Phosphatase 64 38 - 126 U/L   Total Bilirubin 1.8 (H) 0.0 - 1.2 mg/dL   GFR, Estimated 42 (L) >60 mL/min    Comment: (NOTE) Calculated using the CKD-EPI Creatinine Equation (2021)    Anion gap 19 (H) 5 - 15    Comment: Performed at [facility], [address], [place], [place] [address]   Magnesium      Status: None   Collection Time: 12/15/23  5:16 PM  Result Value Ref Range   Magnesium  2.3 1.7 - 2.4 mg/dL    Comment: Performed at [facility], [address] [place], [place] [address]  Hemoglobin A1c     Status: None   Collection Time: 12/15/23  5:22 PM  Result Value Ref Range   Hgb A1c MFr Bld 5.3 4.8 - 5.6 %    Comment: (NOTE) Diagnosis of Diabetes The following HbA1c ranges recommended by the American Diabetes Association (ADA) may be used as an aid in the diagnosis of diabetes mellitus.  Hemoglobin             Suggested A1C NGSP%              Diagnosis  <5.7                   Non Diabetic  5.7-6.4                Pre-Diabetic  >6.4                   Diabetic  <7.0                   Glycemic control for                       adults with diabetes.     Mean Plasma Glucose 105.41 mg/dL    Comment: Performed at [facility], [address] [place], [place] [address]  Ethanol     Status: Abnormal   Collection Time: 12/15/23  5:22 PM  Result Value Ref Range   Alcohol, Ethyl (B) 81 (H) <15 mg/dL    Comment: (NOTE) For medical purposes only. Performed at [facility], [address] [place], [place] [address]  Lipid panel     Status: Abnormal   Collection Time: 12/15/23  5:22 PM  Result Value Ref Range   Cholesterol 155 0 - 200 mg/dL   Triglycerides 846 (H) <150 mg/dL   HDL 38 (L) >59 mg/dL   Total CHOL/HDL Ratio 4.1 RATIO   VLDL 31 0 - 40 mg/dL   LDL Cholesterol 86 0 - 99 mg/dL    Comment:        Total Cholesterol/HDL:CHD Risk Coronary Heart Disease Risk Table                     Men   Women  1/2 Average Risk   3.4   3.3  Average Risk       5.0   4.4  2 X Average Risk   9.6   7.1  3 X Average Risk  23.4   11.0        Use the calculated Patient Ratio above and the CHD Risk Table to determine the patient's CHD Risk.        ATP III CLASSIFICATION (LDL):  <100     mg/dL   Optimal  899-870  mg/dL   Near or Above                     Optimal  130-159  mg/dL   Borderline  839-810  mg/dL   High  >809     mg/dL   Very High Performed at [facility], [address] [place], [place] [address]   TSH     Status: None   Collection Time: 12/15/23  5:22 PM  Result Value Ref Range   TSH 3.372 0.350 - 4.500 uIU/mL    Comment: Performed by a 3rd Generation assay with a functional sensitivity of <=0.01 uIU/mL. Performed at [facility], [address], [place], [place] [address]   POCT Urine Drug Screen - (I-Screen)     Status: Abnormal   Collection Time: 12/15/23  5:23 PM  Result Value Ref Range   POC Amphetamine UR Positive (A) NONE DETECTED (Cut Off Level 1000 ng/mL)   POC Secobarbital (BAR) Positive (A) NONE DETECTED (Cut Off Level 300 ng/mL)   POC Buprenorphine (BUP) None Detected NONE DETECTED (Cut Off Level 10 ng/mL)   POC Oxazepam (BZO) Positive (A) NONE DETECTED (Cut Off Level 300 ng/mL)   POC Cocaine UR None Detected NONE DETECTED (Cut Off Level 300 ng/mL)   POC Methamphetamine UR None Detected NONE DETECTED (Cut Off Level 1000 ng/mL)   POC Morphine  None Detected NONE DETECTED (Cut Off Level 300 ng/mL)   POC Methadone UR None Detected NONE DETECTED (Cut Off Level 300 ng/mL)   POC Oxycodone  UR None Detected NONE DETECTED (Cut Off Level 100 ng/mL)   POC Marijuana UR None Detected NONE DETECTED (Cut Off Level 50 ng/mL)  Potassium     Status: Abnormal   Collection Time: 12/16/23  2:12 AM  Result Value Ref Range   Potassium 3.4 (L) 3.5 - 5.1 mmol/L    Comment: Performed at [facility], [address], [place], [place] [address]    Blood Alcohol level:  Lab Results  Component Value Date   ETH 81 (H) 12/15/2023   ETH <15 11/17/2023    Metabolic Disorder Labs:  Lab Results  Component Value Date   HGBA1C 5.3 12/15/2023   MPG 105.41 12/15/2023   No results found for: PROLACTIN Lab Results  Component Value Date  CHOL 155 12/15/2023   TRIG 153 (H) 12/15/2023   HDL 38 (L)  12/15/2023   CHOLHDL 4.1 12/15/2023   VLDL 31 12/15/2023   LDLCALC 86 12/15/2023   LDLCALC 88 09/07/2023    Current Medications: Current Facility-Administered Medications  Medication Dose Route Frequency Provider Last Rate Last Admin   acetaminophen  (TYLENOL ) tablet 650 mg  650 mg Oral Q6H PRN Brent, Amanda C, NP   650 mg at 12/16/23 0446   alum & mag hydroxide-simeth (MAALOX/MYLANTA) 200-200-20 MG/5ML suspension 30 mL  30 mL Oral Q4H PRN Brent, Amanda C, NP       atorvastatin  (LIPITOR) tablet 10 mg  10 mg Oral Daily Brent, Amanda C, NP   10 mg at 12/16/23 0912   bictegravir-emtricitabine -tenofovir  AF (BIKTARVY ) 50-200-25 MG per tablet 1 tablet  1 tablet Oral Daily Thresa Palma C, NP   1 tablet at 12/16/23 1146   haloperidol  (HALDOL ) tablet 5 mg  5 mg Oral TID PRN Brent, Amanda C, NP       And   diphenhydrAMINE  (BENADRYL ) capsule 50 mg  50 mg Oral TID PRN Brent, Amanda C, NP       haloperidol  lactate (HALDOL ) injection 5 mg  5 mg Intramuscular TID PRN Brent, Amanda C, NP       And   diphenhydrAMINE  (BENADRYL ) injection 50 mg  50 mg Intramuscular TID PRN Brent, Amanda C, NP       And   LORazepam  (ATIVAN ) injection 2 mg  2 mg Intramuscular TID PRN Brent, Amanda C, NP       haloperidol  lactate (HALDOL ) injection 10 mg  10 mg Intramuscular TID PRN Brent, Amanda C, NP       And   diphenhydrAMINE  (BENADRYL ) injection 50 mg  50 mg Intramuscular TID PRN Brent, Amanda C, NP       And   LORazepam  (ATIVAN ) injection 2 mg  2 mg Intramuscular TID PRN Brent, Amanda C, NP       FLUoxetine  (PROZAC ) capsule 40 mg  40 mg Oral Daily Brent, Amanda C, NP   40 mg at 12/16/23 0912   folic acid  (FOLVITE ) tablet 1 mg  1 mg Oral Daily Brent, Amanda C, NP   1 mg at 12/16/23 0913   hydrOXYzine  (ATARAX ) tablet 25 mg  25 mg Oral Q6H PRN Brent, Amanda C, NP       loperamide  (IMODIUM ) capsule 2-4 mg  2-4 mg Oral PRN Brent, Amanda C, NP       LORazepam  (ATIVAN ) tablet 1 mg  1 mg Oral Q6H PRN Brent, Amanda C, NP        losartan  (COZAAR ) tablet 25 mg  25 mg Oral Daily Brent, Amanda C, NP   25 mg at 12/16/23 0912   multivitamin with minerals tablet 1 tablet  1 tablet Oral Daily Brent, Amanda C, NP   1 tablet at 12/16/23 0913   nicotine  (NICODERM CQ  - dosed in mg/24 hours) patch 21 mg  21 mg Transdermal Q0600 Brent, Amanda C, NP   21 mg at 12/16/23 0914   ondansetron  (ZOFRAN -ODT) disintegrating tablet 4 mg  4 mg Oral Q6H PRN Brent, Amanda C, NP       pantoprazole  (PROTONIX ) EC tablet 40 mg  40 mg Oral BID AC Brent, Amanda C, NP   40 mg at 12/16/23 0913   sucralfate  (CARAFATE ) 1 GM/10ML suspension 1 g  1 g Oral TID WC Thresa Palma C, NP   1 g at 12/16/23 1146  thiamine  (Vitamin B-1) tablet 100 mg  100 mg Oral Daily Brent, Amanda C, NP   100 mg at 12/16/23 9086    PTA Medications: Medications Prior to Admission  Medication Sig Dispense Refill Last Dose/Taking   losartan  (COZAAR ) 25 MG tablet Take 25 mg by mouth daily.   Taking   atorvastatin  (LIPITOR) 10 MG tablet Take 1 tablet (10 mg total) by mouth daily. 30 tablet 1    bictegravir-emtricitabine -tenofovir  AF (BIKTARVY ) 50-200-25 MG TABS tablet Take 1 tablet by mouth daily. 90 tablet 1    FLUoxetine  (PROZAC ) 40 MG capsule Take 1 capsule (40 mg total) by mouth daily. 90 capsule 3    folic acid  (FOLVITE ) 1 MG tablet Take 1 tablet (1 mg total) by mouth daily. 100 tablet 0    gabapentin  (NEURONTIN ) 300 MG capsule Take 1 capsule (300 mg total) by mouth 3 (three) times daily for 10 days. 30 capsule 0    loratadine  (CLARITIN ) 10 MG tablet Take 1 tablet (10 mg total) by mouth daily for 5 days. 5 tablet 0    olmesartan  (BENICAR ) 20 MG tablet Take 1 tablet (20 mg total) by mouth daily. 90 tablet 3    omeprazole  (PRILOSEC) 20 MG capsule Take 1 capsule (20 mg total) by mouth 2 (two) times daily. 60 capsule 1    sucralfate  (CARAFATE ) 1 GM/10ML suspension Take 10 mLs (1 g total) by mouth 4 (four) times daily -  with meals and at bedtime. 420 mL 6    thiamine  (VITAMIN B-1)  100 MG tablet Take 1 tablet (100 mg total) by mouth daily. 100 tablet 0     Physical Findings: AIMS: No  CIWA:  CIWA-Ar Total: 4 COWS:     Psychiatric Specialty Exam: General Appearance:  Disheveled   Eye Contact:  Fleeting   Speech:  Pressured   Volume:  Normal   Mood:  Depressed; Hopeless; Dysphoric; Worthless   Affect:  Congruent; Depressed   Thought Content:  Paranoid Ideation; Scattered; Delusions   Suicidal Thoughts:  Suicidal Thoughts: Yes, Passive SI Passive Intent and/or Plan: With Plan; Without Intent   Homicidal Thoughts:  Homicidal Thoughts: No   Thought Process:  Coherent   Orientation:  Full (Time, Place and Person)     Memory:  Recent Fair; Immediate Good   Judgment:  Fair   Insight:  Lacking   Concentration:  Poor   Recall:  Fair   Fund of Knowledge:  Fair   Language:  Fair   Psychomotor Activity:  Psychomotor Activity: Mannerisms; Restlessness; Increased   Assets:  Manufacturing systems engineer; Desire for Improvement; Financial Resources/Insurance; Resilience; Leisure Time   Sleep:  Sleep: Poor Number of Hours of Sleep: 1    Review of Systems Review of Systems  Constitutional:  Positive for malaise/fatigue.  Respiratory:  Negative for shortness of breath.   Cardiovascular:  Negative for chest pain.  Gastrointestinal:  Negative for abdominal pain, constipation, diarrhea, heartburn, nausea and vomiting.  Neurological:  Negative for headaches.  Psychiatric/Behavioral:  Positive for depression and substance abuse. Negative for hallucinations, memory loss and suicidal ideas. The patient is nervous/anxious. The patient does not have insomnia.     Vital signs: Blood pressure 106/75, pulse 90, temperature 97.8 F (36.6 C), temperature source Oral, resp. rate 20, height 5' 6 (1.676 m), weight 84.5 kg, SpO2 97%. Body mass index is 30.05 kg/m. Physical Exam Constitutional:      Appearance: Normal appearance.  HENT:     Head:  Normocephalic and atraumatic.  Eyes:  Extraocular Movements: Extraocular movements intact.     Conjunctiva/sclera: Conjunctivae normal.     Pupils: Pupils are equal, round, and reactive to light.  Cardiovascular:     Rate and Rhythm: Normal rate.     Pulses: Normal pulses.  Pulmonary:     Effort: Pulmonary effort is normal.  Musculoskeletal:        General: Normal range of motion.     Cervical back: Normal range of motion.  Skin:    General: Skin is warm and dry.  Neurological:     General: No focal deficit present.     Mental Status: He is alert and oriented to person, place, and time. Mental status is at baseline.     Assets  Assets:Communication Skills; Desire for Improvement; Financial Resources/Insurance; Resilience; Leisure Time   Treatment Plan Summary: Daily contact with patient to assess and evaluate symptoms and progress in treatment and medication management  ASSESSMENT: Patient seeking detox and residential rehab.  Started CIWA for alcohol use as this is the primary substance that would cause severe withdrawal symptoms.  Methamphetamine withdrawal primarily this fatigue.  Will restart all patient's home medications including Biktarvy , Lipitor, Carafate , folic acid , losartan , thiamine , PPI.  Defer to social work for transfer to a residential rehab upon discharge.   PLAN: Safety and Monitoring:  -- Voluntary admission to inpatient psychiatric unit for safety, stabilization and treatment  -- Daily contact with patient to assess and evaluate symptoms and progress in treatment  -- Patient's case to be discussed in multi-disciplinary team meeting  -- Observation Level : q15 minute checks  -- Vital signs: q12 hours  -- Precautions: suicide, elopement, and assault  2. Psychiatric Problems Methamphetamine use disorder Alcohol use disorder Opioid use disorder Sedative use disorder Major depressive disorder Stimulant induced psychosis -Advise cessation from all  substance use - Restart home fluoxetine  40 mg daily - MVI/thiamine /folic acid  - Holding gabapentin  as this can be sedating but we will continue to monitor - Hydroxyzine  25 mg 3 times daily as needed for anxiety -Trazodone  50 mg nightly as needed for insomnia    The risks/benefits/side-effects/alternatives to the above medication were discussed in detail with the patient and time was given for questions. The patient consents to medication trial. FDA black box warnings, if present, were discussed.  The patient is agreeable with the medication plan, as above. We will monitor the patient's response to pharmacologic treatment, and adjust medications as necessary.  3. Medical Problems #HIV - Restart biktarvy   #Dyslipidemia - Restart home Lipitor 10 mg daily  #Hypertension - Losartan  25 mg daily  #Abdominal pain #GERD - Continue Carafate  1 mg 3 times daily with meals - Restart home PPI as Protonix  40 mg daily -- As needed agitation protocol in-place  4. Routine and other pertinent labs:   Metabolism / endocrine: BMI: Body mass index is 30.05 kg/m. Prolactin: No results found for: PROLACTIN Lipid Panel: Lab Results  Component Value Date   CHOL 155 12/15/2023   TRIG 153 (H) 12/15/2023   HDL 38 (L) 12/15/2023   CHOLHDL 4.1 12/15/2023   VLDL 31 12/15/2023   LDLCALC 86 12/15/2023   LDLCALC 88 09/07/2023   HbgA1c: Hgb A1c MFr Bld (%)  Date Value  12/15/2023 5.3   TSH: TSH  Date Value  12/15/2023 3.372 uIU/mL  09/07/2023 1.37 mIU/L    5. Group Therapy:  -- Encouraged patient to participate in unit milieu and in scheduled group therapies   -- Short Term Goals: Ability to identify changes  in lifestyle to reduce recurrence of condition, verbalize feelings, identify and develop effective coping behaviors, maintain clinical measurements within normal limits, and identify triggers associated with substance abuse/mental health issues will improve. Improvement in ability to  demonstrate self-control and comply with prescribed medications.  -- Long Term Goals: Improvement in symptoms so as ready for discharge -- Patient is encouraged to participate in group therapy while admitted to the psychiatric unit. -- We will address other chronic and acute stressors, which contributed to the patient's MDD (major depressive disorder), recurrent episode, severe (HCC) in order to reduce the risk of self-harm at discharge.  6. Discharge Planning:   -- Social work and case management to assist with discharge planning and identification of hospital follow-up needs prior to discharge  -- Estimated LOS: 5-7 days  -- Discharge Concerns: Need to establish a safety plan; Medication compliance and effectiveness  -- Discharge Goals: Return home with outpatient referrals for mental health follow-up including medication management/psychotherapy  I certify that inpatient services furnished can reasonably be expected to improve the patient's condition.   Signed: Prentice Espy, MD 12/16/2023, 12:31 PM

## 2023-12-16 NOTE — ED Notes (Signed)
Pt asleep at this hour. No apparent distress. RR even and unlabored. Monitored for safety.  

## 2023-12-16 NOTE — ED Notes (Signed)
 Called [facility] to pick up blood drawn for potassium level.

## 2023-12-16 NOTE — Group Note (Signed)
 Date:  12/16/2023 Time:  9:31 AM  Group Topic/Focus:  Goals Group:   The focus of this group is to help patients establish daily goals to achieve during treatment and discuss how the patient can incorporate goal setting into their daily lives to aide in recovery. Orientation:   The focus of this group is to educate the patient on the purpose and policies of crisis stabilization and provide a format to answer questions about their admission.  The group details unit policies and expectations of patients while admitted.    Participation Level:  Did Not Attend   Jimmy Fisher 12/16/2023, 9:31 AM

## 2023-12-16 NOTE — Plan of Care (Signed)
  Problem: Education: Goal: Emotional status will improve Outcome: Not Progressing   Problem: Activity: Goal: Interest or engagement in activities will improve Outcome: Not Progressing   Problem: Health Behavior/Discharge Planning: Goal: Compliance with treatment plan for underlying cause of condition will improve Outcome: Not Progressing

## 2023-12-16 NOTE — Progress Notes (Signed)
 Admission Note:   Jimmy Fisher 43 y.o., male patient presented to [facility] voluntarily with complaints of suicidal ideations with a plan to overdose and being on a drug binge for the past 4 days. He has a past history of MDD, GAD, polysubstance use including methamphetamines, opiates and alcohol. He states he smokes everyday, drinks about 1/5 a day, uses drugs such as meth, cocaine, and THC. He states he would like to detox while here.   Pt placed on high fall risk precautions. He was educated on fall prevention. Pt placed in yellow socks, high fall risk placed on door, and yellow armband placed. Denies HI or AVH. Currently verbalized passive SI with no plan and verbally contracts for safety. Denies any self-harming behaviors. Pt is homeless.   Skin was assessed and patient found to have abrasions on lower extremities (see skin assessment). PT searched and no contraband found, POC and unit policies explained and understanding verbalized. Consents obtained. Food and fluids offered, and accepted. Pt had no additional questions or concerns.

## 2023-12-16 NOTE — BH IP Treatment Plan (Signed)
 Interdisciplinary Treatment and Diagnostic Plan Update  12/16/2023 Time of Session: 10:50 AM Jimmy Fisher MRN: [id number]  Principal Diagnosis: MDD (major depressive disorder), recurrent episode, severe (HCC)  Secondary Diagnoses: Principal Problem:   MDD (major depressive disorder), recurrent episode, severe (HCC) Active Problems:   Opioid dependence with opioid-induced mood disorder (HCC)   HIV disease (HCC)   Alcohol use disorder   Sedative, hypnotic or anxiolytic use disorder, moderate, in controlled environment   Stimulant use disorder   Substance-induced psychotic disorder (HCC)   Current Medications:  Current Facility-Administered Medications  Medication Dose Route Frequency Provider Last Rate Last Admin   acetaminophen  (TYLENOL ) tablet 650 mg  650 mg Oral Q6H PRN Brent, Amanda C, NP   650 mg at 12/16/23 0446   alum & mag hydroxide-simeth (MAALOX/MYLANTA) 200-200-20 MG/5ML suspension 30 mL  30 mL Oral Q4H PRN Brent, Amanda C, NP       atorvastatin  (LIPITOR) tablet 10 mg  10 mg Oral Daily Brent, Amanda C, NP   10 mg at 12/16/23 0912   bictegravir-emtricitabine -tenofovir  AF (BIKTARVY ) 50-200-25 MG per tablet 1 tablet  1 tablet Oral Daily Brent, Amanda C, NP   1 tablet at 12/16/23 1146   haloperidol  (HALDOL ) tablet 5 mg  5 mg Oral TID PRN Brent, Amanda C, NP       And   diphenhydrAMINE  (BENADRYL ) capsule 50 mg  50 mg Oral TID PRN Brent, Amanda C, NP       haloperidol  lactate (HALDOL ) injection 5 mg  5 mg Intramuscular TID PRN Brent, Amanda C, NP       And   diphenhydrAMINE  (BENADRYL ) injection 50 mg  50 mg Intramuscular TID PRN Thresa Alan BROCKS, NP       And   LORazepam  (ATIVAN ) injection 2 mg  2 mg Intramuscular TID PRN Brent, Amanda C, NP       haloperidol  lactate (HALDOL ) injection 10 mg  10 mg Intramuscular TID PRN Thresa Alan BROCKS, NP       And   diphenhydrAMINE  (BENADRYL ) injection 50 mg  50 mg Intramuscular TID PRN Brent, Amanda C, NP       And   LORazepam  (ATIVAN )  injection 2 mg  2 mg Intramuscular TID PRN Brent, Amanda C, NP       FLUoxetine  (PROZAC ) capsule 40 mg  40 mg Oral Daily Brent, Amanda C, NP   40 mg at 12/16/23 0912   folic acid  (FOLVITE ) tablet 1 mg  1 mg Oral Daily Brent, Amanda C, NP   1 mg at 12/16/23 0913   hydrOXYzine  (ATARAX ) tablet 25 mg  25 mg Oral Q6H PRN Brent, Amanda C, NP       loperamide  (IMODIUM ) capsule 2-4 mg  2-4 mg Oral PRN Brent, Amanda C, NP       LORazepam  (ATIVAN ) tablet 1 mg  1 mg Oral Q6H PRN Brent, Amanda C, NP       losartan  (COZAAR ) tablet 25 mg  25 mg Oral Daily Brent, Amanda C, NP   25 mg at 12/16/23 0912   multivitamin with minerals tablet 1 tablet  1 tablet Oral Daily Brent, Amanda C, NP   1 tablet at 12/16/23 0913   nicotine  (NICODERM CQ  - dosed in mg/24 hours) patch 21 mg  21 mg Transdermal Q0600 Brent, Amanda C, NP   21 mg at 12/16/23 0914   ondansetron  (ZOFRAN -ODT) disintegrating tablet 4 mg  4 mg Oral Q6H PRN Brent, Amanda C, NP  pantoprazole  (PROTONIX ) EC tablet 40 mg  40 mg Oral BID AC Brent, Amanda C, NP   40 mg at 12/16/23 0913   sucralfate  (CARAFATE ) 1 GM/10ML suspension 1 g  1 g Oral TID WC Brent, Amanda C, NP   1 g at 12/16/23 1146   thiamine  (Vitamin B-1) tablet 100 mg  100 mg Oral Daily Brent, Amanda C, NP   100 mg at 12/16/23 0913   PTA Medications: Medications Prior to Admission  Medication Sig Dispense Refill Last Dose/Taking   losartan  (COZAAR ) 25 MG tablet Take 25 mg by mouth daily.   Taking   atorvastatin  (LIPITOR) 10 MG tablet Take 1 tablet (10 mg total) by mouth daily. 30 tablet 1    bictegravir-emtricitabine -tenofovir  AF (BIKTARVY ) 50-200-25 MG TABS tablet Take 1 tablet by mouth daily. 90 tablet 1    FLUoxetine  (PROZAC ) 40 MG capsule Take 1 capsule (40 mg total) by mouth daily. 90 capsule 3    folic acid  (FOLVITE ) 1 MG tablet Take 1 tablet (1 mg total) by mouth daily. 100 tablet 0    gabapentin  (NEURONTIN ) 300 MG capsule Take 1 capsule (300 mg total) by mouth 3 (three) times daily for  10 days. 30 capsule 0    loratadine  (CLARITIN ) 10 MG tablet Take 1 tablet (10 mg total) by mouth daily for 5 days. 5 tablet 0    olmesartan  (BENICAR ) 20 MG tablet Take 1 tablet (20 mg total) by mouth daily. 90 tablet 3    omeprazole  (PRILOSEC) 20 MG capsule Take 1 capsule (20 mg total) by mouth 2 (two) times daily. 60 capsule 1    sucralfate  (CARAFATE ) 1 GM/10ML suspension Take 10 mLs (1 g total) by mouth 4 (four) times daily -  with meals and at bedtime. 420 mL 6    thiamine  (VITAMIN B-1) 100 MG tablet Take 1 tablet (100 mg total) by mouth daily. 100 tablet 0     Patient Stressors: Financial difficulties   Substance abuse    Patient Strengths: Motivation for treatment/growth  Physical Health   Treatment Modalities: Medication Management, Group therapy, Case management,  1 to 1 session with clinician, Psychoeducation, Recreational therapy.   Physician Treatment Plan for Primary Diagnosis: MDD (major depressive disorder), recurrent episode, severe (HCC) Long Term Goal(s):     Short Term Goals:    Medication Management: Evaluate patient's response, side effects, and tolerance of medication regimen.  Therapeutic Interventions: 1 to 1 sessions, Unit Group sessions and Medication administration.  Evaluation of Outcomes: Not Progressing  Physician Treatment Plan for Secondary Diagnosis: Principal Problem:   MDD (major depressive disorder), recurrent episode, severe (HCC) Active Problems:   Opioid dependence with opioid-induced mood disorder (HCC)   HIV disease (HCC)   Alcohol use disorder   Sedative, hypnotic or anxiolytic use disorder, moderate, in controlled environment   Stimulant use disorder   Substance-induced psychotic disorder (HCC)  Long Term Goal(s):     Short Term Goals:       Medication Management: Evaluate patient's response, side effects, and tolerance of medication regimen.  Therapeutic Interventions: 1 to 1 sessions, Unit Group sessions and Medication  administration.  Evaluation of Outcomes: Not Progressing   RN Treatment Plan for Primary Diagnosis: MDD (major depressive disorder), recurrent episode, severe (HCC) Long Term Goal(s): Knowledge of disease and therapeutic regimen to maintain health will improve  Short Term Goals: Ability to remain free from injury will improve, Ability to verbalize frustration and anger appropriately will improve, Ability to demonstrate self-control, Ability to participate  in decision making will improve, Ability to verbalize feelings will improve, Ability to disclose and discuss suicidal ideas, Ability to identify and develop effective coping behaviors will improve, and Compliance with prescribed medications will improve  Medication Management: RN will administer medications as ordered by provider, will assess and evaluate patient's response and provide education to patient for prescribed medication. RN will report any adverse and/or side effects to prescribing provider.  Therapeutic Interventions: 1 on 1 counseling sessions, Psychoeducation, Medication administration, Evaluate responses to treatment, Monitor vital signs and CBGs as ordered, Perform/monitor CIWA, COWS, AIMS and Fall Risk screenings as ordered, Perform wound care treatments as ordered.  Evaluation of Outcomes: Not Progressing   LCSW Treatment Plan for Primary Diagnosis: MDD (major depressive disorder), recurrent episode, severe (HCC) Long Term Goal(s): Safe transition to appropriate next level of care at discharge, Engage patient in therapeutic group addressing interpersonal concerns.  Short Term Goals: Engage patient in aftercare planning with referrals and resources, Increase social support, Increase ability to appropriately verbalize feelings, Increase emotional regulation, Facilitate acceptance of mental health diagnosis and concerns, Facilitate patient progression through stages of change regarding substance use diagnoses and concerns,  Identify triggers associated with mental health/substance abuse issues, and Increase skills for wellness and recovery  Therapeutic Interventions: Assess for all discharge needs, 1 to 1 time with Social worker, Explore available resources and support systems, Assess for adequacy in community support network, Educate family and significant other(s) on suicide prevention, Complete Psychosocial Assessment, Interpersonal group therapy.  Evaluation of Outcomes: Not Progressing   Progress in Treatment: Attending groups: No. Participating in groups: No. Taking medication as prescribed: Yes. Toleration medication: Yes. Family/Significant other contact made: patient declined to provide consents Patient understands diagnosis: Yes. Discussing patient identified problems/goals with staff: Yes. Medical problems stabilized or resolved: Yes. Denies suicidal/homicidal ideation: Yes. Issues/concerns per patient self-inventory: No.  New problem(s) identified:  No  New Short Term/Long Term Goal(s):  detox, medication management for mood stabilization; elimination of SI thoughts; development of comprehensive mental wellness/sobriety plan    Patient Goals:  I want to detox from alcohol and meth.  I want to go to inpatient rehab.   Discharge Plan or Barriers:  Patient recently admitted. CSW will continue to follow and assess for appropriate referrals and possible discharge planning.   Reason for Continuation of Hospitalization: Anxiety Depression Medication stabilization Suicidal ideation  Estimated Length of Stay:  5 - 7 days  Last 3 [place] Suicide Severity Risk Score: Flowsheet Row [facility] (Current[facility] from 12[facility]15/2023 in [facility] ED from 12/12/2023 in [facility] Emergency Department [facility]  C-SSRS RISK CATEGORY High Risk High Risk No Risk    Last [facility] 2/9 Scores:    09/07/2023     2:41 PM 09/07/2023    1:51 PM 12/06/2022    8:55 AM  Depression screen PHQ 2/9  Decreased Interest 0 0 0  Down, Depressed, Hopeless 0 0 1  PHQ - 2 Score 0 0 1  Altered sleeping  2   Tired, decreased energy  3   Change in appetite  0   Feeling bad or failure about yourself   1   Trouble concentrating  2   Moving slowly or fidgety/restless  0   Suicidal thoughts  0   PHQ-9 Score  8   Difficult doing work/chores  Somewhat difficult     Scribe for Treatment Team: Jordie Schreur O Cecilie Heidel, LCSWA 12/16/2023 6:43  PM

## 2023-12-16 NOTE — BHH Counselor (Signed)
 Adult Comprehensive Assessment  Patient ID: AZION CENTRELLA, male   DOB: 09-17-1980, 43 y.o.   MRN: [id number]  Information Source: Information source: Patient (PSA completed with pt)  Current Stressors:  Patient states their primary concerns and needs for treatment are::  I am destroying my life and I need to get help Patient states their goals for this hospitilization and ongoing recovery are::  ... to get into treatment Educational / Learning stressors: none reported Employment / Job issues: none reported Family Relationships:  yes, my son want talk to me, I have spoken to my father in seven years and my wife divorced me Surveyor, quantity / Lack of resources (include bankruptcy):  yes, I have money Housing / Lack of housing:  yes, I am homeless Physical health (include injuries & life threatening diseases):  yes, I have HIV, my bidy is sore and I have blisters everywhere Social relationships: none reported Substance abuse:  I have addicted to drugs for many years and I need help, I use meth and alcohol Bereavement / Loss:  none reported  Living/Environment/Situation:  Living Arrangements: Other (Comment) (unhoused) Living conditions (as described by patient or guardian):  I am homeless, homeless for 2 yrs Who else lives in the home?: none reported How long has patient lived in current situation?: 2 yrs What is atmosphere in current home: Abusive, Chaotic, Temporary  Family History:  Marital status: Divorced Divorced, when?: Pt stated his wife divorvced him after 24 years of marriage What types of issues is patient dealing with in the relationship?: drug use, infidelity Additional relationship information: n/a Are you sexually active?: No What is your sexual orientation?:  I am straight Has your sexual activity been affected by drugs, alcohol, medication, or emotional stress?: none reported Does patient have children?: Yes How is patient's relationship with their  children?: ... my relationship with my older son is strained and the relationship with my younger son is pretty good thery are 34 and 65 yrs old  Childhood History:  By whom was/is the patient raised?: Mother/father and step-parent Additional childhood history information: Reports he was a veryb rebellious child Description of patient's relationship with caregiver when they were a child: Reports he had a strained relationship with his mother and stepfather as a child due to his rebellious and defiant behaviors Patient's description of current relationship with people who raised him/her:  I do not speak with my father but my mother and I have a great relationship How were you disciplined when you got in trouble as a child/adolescent?: Whoopings/Spankings; Punishments; Restrictions; Sent to [employer] school Does patient have siblings?: Yes Number of Siblings: 2 Description of patient's current relationship with siblings:  my relationship with them is strained Did patient suffer any verbal/emotional/physical/sexual abuse as a child?: No Did patient suffer from severe childhood neglect?: No Has patient ever been sexually abused/assaulted/raped as an adolescent or adult?: No Was the patient ever a victim of a crime or a disaster?: No Witnessed domestic violence?: No Has patient been affected by domestic violence as an adult?: No Description of domestic violence: pt denies DV  Education:  Highest grade of school patient has completed: GED Currently a Consulting civil engineer?: No Learning disability?: No  Employment/Work Situation:   Employment Situation: Unemployed Patient's Job has Been Impacted by Current Illness: No What is the Longest Time Patient has Held a Job?: 4 years Where was the Patient Employed at that Time?: Therapist, music Has Patient ever Been in the [employer]?: No  Financial Resources:  Financial resources: No income  Alcohol/Substance Abuse:   What has been your use of  drugs/alcohol within the last 12 months?:  I just used a couple of days ago If attempted suicide, did drugs/alcohol play a role in this?: No Alcohol/Substance Abuse Treatment Hx: Past Tx, Inpatient, Past Tx, Outpatient, Past detox If yes, describe treatment:  I have been in at least 20 plus rehab facilities and was just kicked out of [facility] a 4 days ago Has alcohol/substance abuse ever caused legal problems?: Yes  Social Support System:   Conservation officer, nature Support System: None Describe Community Support System: none reported Type of faith/religion: Christian How does patient's faith help to cope with current illness?:  I pray  Leisure/Recreation:   Do You Have Hobbies?: Yes Leisure and Hobbies: Hunting and fishing  Strengths/Needs:   What is the patient's perception of their strengths?:  I am a kind person, loving and generous Patient states these barriers may affect/interfere with their treatment:  I am not sure, I have nothing Patient states these barriers may affect their return to the community:  I am not sure I just need help Other important information patient would like considered in planning for their treatment: na  Discharge Plan:   Currently receiving community mental health services: No Patient states concerns and preferences for aftercare planning are:  ... I need to go to treatment Patient states they will know when they are safe and ready for discharge when:  when I have a place to go for treatment Does patient have access to transportation?: No Does patient have financial barriers related to discharge medications?: No Patient description of barriers related to discharge medications:  I have insurance no barriers' Plan for no access to transportation at discharge:  I hope the hospital will help Plan for living situation after discharge:  [place] Will patient be returning to same living situation after discharge?:  No  Summary/Recommendations:   Summary and Recommendations (to be completed by the evaluator): Calbert is a 43 yo male voluntarily admitted to [facility] after representing to [facility] accompanied by his mother requesting detox and inpatient substance use disorder treatment. Pt stated that he has been on a 4-day meth/alcohol/barbiturate binge since he says he was asked to leave [facility] 4 days ago. Pt stated that he has been binging for 4 days and his mother got in touch with him today and brought him to [facility] for detox. Pt reported stressors as family relationships, finances/lack of resources, homelessness, physical health and substance use disorder. Pt denies SI/HI/AVH. Pt requesting referral to [facility] for inpatient treatment following discharge. Pt reported having a court date on 12/19/2023, a felony charge of firearm possession by a felon. Patient will benefit from crisis stabilization, medication evaluation, group therapy and psychoeducation, in addition to case management for discharge planning. At discharge it is recommended that Patient adhere to the established discharge plan and continue in treatment.  Miyani Cronic R. 12/16/2023

## 2023-12-16 NOTE — Group Note (Signed)
 Date:  12/16/2023 Time:  8:52 PM  Group Topic/Focus:  Wrap-Up Group:   The focus of this group is to help patients review their daily goal of treatment and discuss progress on daily workbooks.    Participation Level:  Did Not Attend  Participation Quality:  Patient did not attend wrap up group  Affect:  Patient did not attend wrap up group  Cognitive:  Patient did not attend wrap up group  Insight: None  Engagement in Group:  None  Modes of Intervention:  Patient did not attend wrap up group  Additional Comments:  Patient did not attend wrap up group  Dena JINNY Mace 12/16/2023, 8:52 PM

## 2023-12-17 LAB — COMPREHENSIVE METABOLIC PANEL WITH GFR
ALT: 197 U/L — ABNORMAL HIGH (ref 0–44)
AST: 96 U/L — ABNORMAL HIGH (ref 15–41)
Albumin: 4.8 g/dL (ref 3.5–5.0)
Alkaline Phosphatase: 117 U/L (ref 38–126)
Anion gap: 12 (ref 5–15)
BUN: 12 mg/dL (ref 6–20)
CO2: 26 mmol/L (ref 22–32)
Calcium: 10 mg/dL (ref 8.9–10.3)
Chloride: 102 mmol/L (ref 98–111)
Creatinine, Ser: 1.03 mg/dL (ref 0.61–1.24)
GFR, Estimated: >60 mL/min (ref >60)
Glucose, Bld: 101 mg/dL — ABNORMAL HIGH (ref 70–99)
Potassium: 3.8 mmol/L (ref 3.5–5.1)
Sodium: 140 mmol/L (ref 135–145)
Total Bilirubin: 1.2 mg/dL (ref 0.0–1.2)
Total Protein: 7.6 g/dL (ref 6.5–8.1)

## 2023-12-17 MED ORDER — NEOMYCIN-POLYMYXIN-PRAMOXINE 1 % EX CREA
TOPICAL_CREAM | Freq: Two times a day (BID) | CUTANEOUS | Status: DC
  Filled 2023-12-17: qty 28

## 2023-12-17 MED ORDER — OLANZAPINE 2.5 MG PO TABS: 2.5000 mg | ORAL_TABLET | Freq: Every day | ORAL | Status: DC

## 2023-12-17 MED ORDER — TRIPLE ANTIBIOTIC 3.5-400-5000 EX OINT
TOPICAL_OINTMENT | Freq: Two times a day (BID) | CUTANEOUS | Status: DC
  Administered 2023-12-18 – 2023-12-20 (×2): 1 via CUTANEOUS
  Filled 2023-12-17 (×8): qty 1

## 2023-12-17 NOTE — Progress Notes (Signed)
   12/17/23 2035  Psych Admission Type (Psych Patients Only)  Admission Status Voluntary  Psychosocial Assessment  Patient Complaints None (pt reports he is resting up)  Eye Contact Fair  Facial Expression Flat  Affect Appropriate to circumstance  Speech Logical/coherent  Interaction Isolative  Motor Activity Slow  Appearance/Hygiene In scrubs  Behavior Characteristics Appropriate to situation  Mood Depressed  Thought Process  Coherency WDL  Content WDL  Delusions None reported or observed  Perception WDL  Hallucination None reported or observed  Judgment Impaired  Confusion None  Danger to Self  Current suicidal ideation? Denies  Danger to Others  Danger to Others None reported or observed

## 2023-12-17 NOTE — Plan of Care (Signed)
   Problem: Education: Goal: Emotional status will improve Outcome: Not Progressing Goal: Mental status will improve Outcome: Not Progressing Goal: Verbalization of understanding the information provided will improve Outcome: Not Progressing

## 2023-12-17 NOTE — Progress Notes (Signed)
 [facility] MD Progress Note  12/17/2023 2:33 PM Jimmy Fisher  MRN:  [id number] Subjective:   Jimmy Fisher Fisher a 43 yr old male who presented on 7/3 to [facility] requesting Detox after a multi-day binge, he was admitted to [facility] on 7/4.  PPHx Fisher significant for MDD, GAD, Polysubstance Abuse (EtOH, Meth), Suicide Attempts (11/2023), a remote history of Self Injurious Behavior, and Multiple Psychiatric Hospitalizations.   Case was discussed in the multidisciplinary team. MAR was reviewed and patient was compliant with medications.  He received PRN Tylenol  yesterday.   Psychiatric Team made the following recommendations yesterday: - Restart home fluoxetine  40 mg daily - MVI/thiamine /folic acid    On interview today patient reports he slept good last night.  He reports his appetite Fisher doing good.  He reports no SI, HI, or AVH.  He reports no Paranoia or Ideas of Reference.  He reports no issues with his medications.  He reports he continues to be very tired.  He reports withdrawal symptoms of headache, sweats, and shakes.  He requests to restart Zyprexa  as he had been on it in the past for mood stability but had stopped it due to where he was going.  He reports he has a thigh rash from walking around in wet clothes.  He reports no other concerns at present.   Principal Problem: MDD (major depressive disorder), recurrent episode, severe (HCC) Diagnosis: Principal Problem:   MDD (major depressive disorder), recurrent episode, severe (HCC) Active Problems:   Opioid dependence with opioid-induced mood disorder (HCC)   HIV disease (HCC)   Alcohol use disorder   Sedative, hypnotic or anxiolytic use disorder, moderate, in controlled environment   Stimulant use disorder   Substance-induced psychotic disorder (HCC)  Total Time spent with patient:  I personally spent 35 minutes on the unit in direct patient care. The direct patient care time included face-to-face time with the patient, reviewing the  patient's chart, communicating with other professionals, and coordinating care. Greater than 50% of this time was spent in counseling or coordinating care with the patient regarding goals of hospitalization, psycho-education, and discharge planning needs.   Past Psychiatric History:  MDD, GAD, Polysubstance Abuse (EtOH, Meth), Suicide Attempts (11/2023), a remote history of Self Injurious Behavior, and Multiple Psychiatric Hospitalizations.   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   Neuromuscular disorder (HCC)    fibromyalgia   Pulmonary embolism (HCC)    2012   SLAP tear of shoulder    left   Urticarial rash 12/05/2022   Vaccine counseling 05/09/2023    Past Surgical History:  Procedure Laterality Date   COLONOSCOPY     I & D EXTREMITY Left 07/16/2020   Procedure: IRRIGATION AND DEBRIDEMENT FOREARM;  Surgeon: Shari Easter, MD;  Location: [facility] OR;  Service: Orthopedics;  Laterality: Left;   IRRIGATION AND DEBRIDEMENT ELBOW Right 11/21/2023   Procedure: IRRIGATION AND DEBRIDEMENT ELBOW;  Surgeon: Beuford Anes, MD;  Location: [facility] ORS;  Service: Orthopedics;  Laterality: Right;   SHOULDER ARTHROSCOPY WITH BICEPS TENDON REPAIR Left 10/22/2020   Procedure: LEFT SHOULDER ARTHROSCOPY, BICEPS TENODESIS;  Surgeon: Jerri Kay HERO,  MD;  Location: [facility];  Service: Orthopedics;  Laterality: Left;   SUBACROMIAL DECOMPRESSION Left 10/22/2020   Procedure: SUBACROMIAL DECOMPRESSION;  Surgeon: Jerri Kay HERO, MD;  Location: [facility];  Service: Orthopedics;  Laterality: Left;   UPPER GI ENDOSCOPY     Family History:  Family  History  Problem Relation Age of Onset   Fibromyalgia Mother    Cancer Father    Family Psychiatric  History:  None Reported  Social History:  Social History   Substance and Sexual Activity  Alcohol Use Not Currently   Alcohol/week: 30.0 standard drinks of alcohol   Types: 30 Standard drinks or equivalent per week   Comment: 1/5 a day reported by patient     Social History   Substance and Sexual Activity  Drug Use Yes   Types: Cocaine, Methamphetamines, Marijuana    Social History   Socioeconomic History   Marital status: Married    Spouse name: Not on file   Number of children: 2   Years of education: Not on file   Highest education level: Not on file  Occupational History   Not on file  Tobacco Use   Smoking status: Every Day    Types: E-cigarettes    Passive exposure: Past   Smokeless tobacco: Former    Types: Snuff  Vaping Use   Vaping status: Every Day   Substances: Nicotine   Substance and Sexual Activity   Alcohol use: Not Currently    Alcohol/week: 30.0 standard drinks of alcohol    Types: 30 Standard drinks or equivalent per week    Comment: 1/5 a day reported by patient   Drug use: Yes    Types: Cocaine, Methamphetamines, Marijuana   Sexual activity: Yes    Comment: declined condoms 09/07/23  Other Topics Concern   Not on file  Social History Narrative   Right Handed    Has two sons    Lives in a one story home    Social Drivers of Health   Financial Resource Strain: Not on file  Food Insecurity: Food Insecurity Present (12/16/2023)   Hunger Vital Sign    Worried About Running Out of Food in the Last Year: Sometimes true    Ran Out of Food in the Last Year: Sometimes true  Transportation Needs: No Transportation Needs (12/16/2023)   PRAPARE - Administrator, Civil Service (Medical): No    Lack of Transportation (Non-Medical): No  Physical Activity: Not on file  Stress: Not on file  Social Connections: Unknown (11/18/2023)   Social  Connection and Isolation Panel    Frequency of Communication with Friends and Family: Not on file    Frequency of Social Gatherings with Friends and Family: Not on file    Attends Religious Services: Not on file    Active Member of Clubs or Organizations: Not on file    Attends Banker Meetings: Not on file    Marital Status: Divorced   Additional Social History:                         Sleep: Good Estimated Sleeping Duration (Last 24 Hours): 3.75-6.75 hours  Appetite:  Good  Current Medications: Current Facility-Administered Medications  Medication Dose Route Frequency Provider Last Rate Last Admin   acetaminophen  (TYLENOL ) tablet 650 mg  650 mg Oral Q6H PRN Brent, Amanda C, NP   650 mg at 12/17/23 1145   alum & mag  hydroxide-simeth (MAALOX/MYLANTA) 200-200-20 MG/5ML suspension 30 mL  30 mL Oral Q4H PRN Brent, Amanda C, NP       atorvastatin  (LIPITOR) tablet 10 mg  10 mg Oral Daily Brent, Amanda C, NP   10 mg at 12/17/23 0855   bictegravir-emtricitabine -tenofovir  AF (BIKTARVY ) 50-200-25 MG per tablet 1 tablet  1 tablet Oral Daily Brent, Amanda C, NP   1 tablet at 12/17/23 9380   haloperidol  (HALDOL ) tablet 5 mg  5 mg Oral TID PRN Brent, Amanda C, NP       And   diphenhydrAMINE  (BENADRYL ) capsule 50 mg  50 mg Oral TID PRN Brent, Amanda C, NP       haloperidol  lactate (HALDOL ) injection 5 mg  5 mg Intramuscular TID PRN Brent, Amanda C, NP       And   diphenhydrAMINE  (BENADRYL ) injection 50 mg  50 mg Intramuscular TID PRN Brent, Amanda C, NP       And   LORazepam  (ATIVAN ) injection 2 mg  2 mg Intramuscular TID PRN Brent, Amanda C, NP       haloperidol  lactate (HALDOL ) injection 10 mg  10 mg Intramuscular TID PRN Brent, Amanda C, NP       And   diphenhydrAMINE  (BENADRYL ) injection 50 mg  50 mg Intramuscular TID PRN Brent, Amanda C, NP       And   LORazepam  (ATIVAN ) injection 2 mg  2 mg Intramuscular TID PRN Brent, Amanda C, NP       FLUoxetine  (PROZAC ) capsule  40 mg  40 mg Oral Daily Brent, Amanda C, NP   40 mg at 12/17/23 9144   folic acid  (FOLVITE ) tablet 1 mg  1 mg Oral Daily Brent, Amanda C, NP   1 mg at 12/17/23 9144   hydrOXYzine  (ATARAX ) tablet 25 mg  25 mg Oral Q6H PRN Brent, Amanda C, NP       loperamide  (IMODIUM ) capsule 2-4 mg  2-4 mg Oral PRN Brent, Amanda C, NP       LORazepam  (ATIVAN ) tablet 1 mg  1 mg Oral Q6H PRN Brent, Amanda C, NP       losartan  (COZAAR ) tablet 25 mg  25 mg Oral Daily Brent, Amanda C, NP   25 mg at 12/17/23 0855   multivitamin with minerals tablet 1 tablet  1 tablet Oral Daily Brent, Amanda C, NP   1 tablet at 12/17/23 0855   neomycin -bacitracin -polymyxin [phone] OINT   Apply externally BID Lige Lakeman S, DO       nicotine  (NICODERM CQ  - dosed in mg/24 hours) patch 21 mg  21 mg Transdermal Q0600 Brent, Amanda C, NP   21 mg at 12/17/23 0856   ondansetron  (ZOFRAN -ODT) disintegrating tablet 4 mg  4 mg Oral Q6H PRN Brent, Amanda C, NP       pantoprazole  (PROTONIX ) EC tablet 40 mg  40 mg Oral BID AC Brent, Amanda C, NP   40 mg at 12/17/23 0856   sucralfate  (CARAFATE ) 1 GM/10ML suspension 1 g  1 g Oral TID WC Brent, Amanda C, NP   1 g at 12/17/23 1145   thiamine  (Vitamin B-1) tablet 100 mg  100 mg Oral Daily Brent, Amanda C, NP   100 mg at 12/17/23 9144    Lab Results:  Results for orders placed or performed during the hospital encounter of 12/16/23 (from the past 48 hours)  Comprehensive metabolic panel     Status: Abnormal   Collection Time: 12/17/23  7:15 AM  Result  Value Ref Range   Sodium 140 135 - 145 mmol/L   Potassium 3.8 3.5 - 5.1 mmol/L   Chloride 102 98 - 111 mmol/L   CO2 26 22 - 32 mmol/L   Glucose, Bld 101 (H) 70 - 99 mg/dL    Comment: Glucose reference range applies only to samples taken after fasting for at least 8 hours.   BUN 12 6 - 20 mg/dL   Creatinine, Ser 8.96 0.61 - 1.24 mg/dL   Calcium  10.0 8.9 - 10.3 mg/dL   Total Protein 7.6 6.5 - 8.1 g/dL   Albumin 4.8 3.5 - 5.0 g/dL   AST  96 (H) 15 - 41 U/L   ALT 197 (H) 0 - 44 U/L   Alkaline Phosphatase 117 38 - 126 U/L   Total Bilirubin 1.2 0.0 - 1.2 mg/dL   GFR, Estimated >39 >39 mL/min    Comment: (NOTE) Calculated using the CKD-EPI Creatinine Equation (2021)    Anion gap 12 5 - 15    Comment: Performed at [facility], [address] [place], [place] [address]    Blood Alcohol level:  Lab Results  Component Value Date   ETH 81 (H) 12/15/2023   ETH <15 11/17/2023    Metabolic Disorder Labs: Lab Results  Component Value Date   HGBA1C 5.3 12/15/2023   MPG 105.41 12/15/2023   No results found for: PROLACTIN Lab Results  Component Value Date   CHOL 155 12/15/2023   TRIG 153 (H) 12/15/2023   HDL 38 (L) 12/15/2023   CHOLHDL 4.1 12/15/2023   VLDL 31 12/15/2023   LDLCALC 86 12/15/2023   LDLCALC 88 09/07/2023    Physical Findings: AIMS:  ,  ,  ,  ,  ,  ,   CIWA:  CIWA-Ar Total: 1 COWS:     Musculoskeletal: Strength & Muscle Tone: within normal limits Gait & Station: laying in bed Patient leans: N/A  Psychiatric Specialty Exam:  Presentation  General Appearance:  Casual  Eye Contact: Fair  Speech: Clear and Coherent; Normal Rate  Speech Volume: Normal  Handedness: Right   Mood and Affect  Mood: Dysphoric  Affect: Congruent   Thought Process  Thought Processes: Coherent; Goal Directed  Descriptions of Associations:Intact  Orientation:Full (Time, Place and Person)  Thought Content:Logical; WDL  History of Schizophrenia/Schizoaffective disorder:No  Duration of Psychotic Symptoms:N/A  Hallucinations:Hallucinations: None  Ideas of Reference:None  Suicidal Thoughts:Suicidal Thoughts: No  Homicidal Thoughts:Homicidal Thoughts: No   Sensorium  Memory: Immediate Fair; Recent Fair  Judgment: Fair  Insight: Fair   Art therapist  Concentration: Fair  Attention Span: Fair  Recall: [employer] of  Knowledge: Fair  Language: Fair   Psychomotor Activity  Psychomotor Activity:Psychomotor Activity: Normal   Assets  Assets: Manufacturing systems engineer; Desire for Improvement; Resilience   Sleep  Sleep:Sleep: Good    Physical Exam: Physical Exam Vitals and nursing note reviewed.  Constitutional:      General: He Fisher not in acute distress.    Appearance: Normal appearance. He Fisher obese. He Fisher not ill-appearing or toxic-appearing.  HENT:     Head: Normocephalic and atraumatic.  Pulmonary:     Effort: Pulmonary effort Fisher normal.  Neurological:     General: No focal deficit present.     Mental Status: He Fisher alert.    Review of Systems  Respiratory:  Negative for cough and shortness of breath.   Cardiovascular:  Negative for chest pain.  Gastrointestinal:  Negative for abdominal pain, constipation,  diarrhea, nausea and vomiting.  Neurological:  Negative for dizziness, weakness and headaches.  Psychiatric/Behavioral:  Positive for depression. Negative for hallucinations and suicidal ideas. The patient Fisher not nervous/anxious.    Blood pressure (!) 121/90, pulse 72, temperature 97.8 F (36.6 C), temperature source Oral, resp. rate 17, height 5' 6 (1.676 m), weight 84.5 kg, SpO2 97%. Body mass index Fisher 30.05 kg/m.   Treatment Plan Summary: Daily contact with patient to assess and evaluate symptoms and progress in treatment and Medication management  Jimmy Fisher Fisher a 43 yr old male who presented on 7/3 to [facility] requesting Detox after a multi-day binge, he was admitted to [facility] on 7/4.  PPHx Fisher significant for MDD, GAD, Polysubstance Abuse (EtOH, Meth), Suicide Attempts (11/2023), a remote history of Self Injurious Behavior, and Multiple Psychiatric Hospitalizations.    Jimmy Fisher continuing to detox form his Meth abuse.  He did request to restart Zyprexa  but due to his EKG with prolonged Qtc we will not restart it at this time.  We will reorder and EKG and if his Qtc  normalizes we can consider starting it.  We will start Neosporin for his thigh rash.  We will not make any other changes to his medications at this time.  We will continue to monitor.   MDD, Recurrent, Severe, w/out Psychosis  GAD: -Continue Prozac  40 mg daily for depression and anxiety -Continue Agitation Protocol: Haldol /Ativan /Benadryl    Withdrawal: -Continue CIWA, last score= 3  @ 0855  7/5 -Continue Ativan  1 mg q6 PRN CIWA>10 -Continue Bentyl  20 mg q6 PRN spasms -Continue Imodium  2-4 mg PRN diarrhea -Continue Zofran -ODT 4 mg q6 PRN nausea -Continue Thiamine  100 mg daily for nutritional supplementation -Continue Multivitamin daily for nutritional supplementation -Continue Folic Acid  1 mg daily for nutritional supplementation   HIV: -Continue Biktarvy  50-200-25 mg daily   Nicotine  Dependence: -Continue Nicotine  Patch 21 mg daily   -Start Neosporin BID for thigh rash -Continue Lipitor 10 mg daily -Continue Losartan  25 mg daily -Continue Protonix  EC 40 mg daily -Continue Carafate  1 g TID -Continue PRN's: Tylenol , Maalox, Atarax , Milk of Magnesia   --  The risks/benefits/side-effects/alternatives to medications were discussed in detail with the patient and time was given for questions. The patient consents to medication trials.                -- Metabolic profile and EKG monitoring obtained while on an atypical antipsychotic (Lipid Panel: WNL except Trig: 153,  HDL: 38, A1c: 5.3 EKG: NSR w/ QTc: 524)              -- Encouraged patient to participate in unit milieu and in scheduled group therapies              -- Short Term Goals: Ability to identify changes in lifestyle to reduce recurrence of condition will improve, Ability to verbalize feelings will improve, Ability to disclose and discuss suicidal ideas, Ability to demonstrate self-control will improve, Ability to identify and develop effective coping behaviors will improve, Ability to maintain clinical measurements within  normal limits will improve, Compliance with prescribed medications will improve, and Ability to identify triggers associated with substance abuse/mental health issues will improve             -- Long Term Goals: Improvement in symptoms so as ready for discharge   Safety and Monitoring:             -- Voluntary admission to inpatient psychiatric unit for safety, stabilization and treatment             --  Daily contact with patient to assess and evaluate symptoms and progress in treatment             -- Patient's case to be discussed in multi-disciplinary team meeting             -- Observation Level : q15 minute checks             -- Vital signs:  q12 hours             -- Precautions: suicide, elopement, and assault  Discharge Planning:              -- Social work and case management to assist with discharge planning and identification of hospital follow-up needs prior to discharge             -- Estimated LOS: 4-6 more days             -- Discharge Concerns: Need to establish a safety plan; Medication compliance and effectiveness             -- Discharge Goals: Return home with outpatient referrals for mental health follow-up including medication management/psychotherapy   Marsa GORMAN Rosser, DO 12/17/2023, 2:33 PM

## 2023-12-17 NOTE — Progress Notes (Signed)
   12/17/23 0855  Psych Admission Type (Psych Patients Only)  Admission Status Voluntary  Psychosocial Assessment  Patient Complaints Anxiety;Irritability  Eye Contact Brief  Facial Expression Flat  Affect Flat  Speech Logical/coherent  Interaction Minimal;Isolative  Motor Activity Slow  Appearance/Hygiene In scrubs  Behavior Characteristics Irritable  Mood Depressed;Anxious  Thought Process  Coherency WDL  Content WDL  Delusions None reported or observed  Perception WDL  Hallucination None reported or observed  Judgment Impaired  Confusion None  Danger to Self  Current suicidal ideation? Denies  Agreement Not to Harm Self Yes  Description of Agreement Verbal  Danger to Others  Danger to Others None reported or observed

## 2023-12-17 NOTE — Group Note (Signed)
 Date:  12/17/2023 Time:  8:59 AM  Group Topic/Focus:  Goals Group:   The focus of this group is to help patients establish daily goals to achieve during treatment and discuss how the patient can incorporate goal setting into their daily lives to aide in recovery.    Participation Level:  Did Not Attend  Participation Quality:    Affect:    Cognitive:    Insight:   Engagement in Group:    Modes of Intervention:    Additional Comments:  Pt were encourage to attend group. Pt refused to attend.  Jimmy Fisher 12/17/2023, 8:59 AM

## 2023-12-18 MED ORDER — OLANZAPINE 2.5 MG PO TABS
2.5000 mg | ORAL_TABLET | Freq: Every day | ORAL | Status: DC
  Administered 2023-12-18: 2.5 mg via ORAL
  Filled 2023-12-18: qty 1

## 2023-12-18 MED ORDER — NICOTINE POLACRILEX 2 MG MT GUM
2.0000 mg | CHEWING_GUM | OROMUCOSAL | Status: DC | PRN
  Administered 2023-12-18 – 2023-12-21 (×7): 2 mg via ORAL
  Filled 2023-12-18 (×7): qty 1

## 2023-12-18 NOTE — Progress Notes (Addendum)
 [facility] MD Progress Note  12/18/2023 10:53 AM Jimmy Fisher  MRN:  [id number] Subjective:   Jimmy Fisher is a 43 yr old male who presented on 7/3 to [facility] requesting Detox after a multi-day binge, he was admitted to [facility] on 7/4.  PPHx is significant for MDD, GAD, Polysubstance Abuse (EtOH, Meth), Suicide Attempts (11/2023), a remote history of Self Injurious Behavior, and Multiple Psychiatric Hospitalizations.   Case was discussed in the multidisciplinary team. MAR was reviewed and patient was compliant with medications.  He received PRN Tylenol  x2 and Hydroxyzine  yesterday.   Psychiatric Team made the following recommendations yesterday: -Continue Prozac  40 mg daily for depression and anxiety    On interview today patient reports he slept poor last night due to withdrawal symptoms.  He reports his appetite is doing fair.  He reports no SI, HI, or AVH.  He reports no Paranoia or Ideas of Reference.  He reports no issues with his medications.  He reports having withdrawal symptoms- nausea, diarrhea, sweats, and headache.  He reports that this disrupted his sleep and has caused an increase in his anxiety.  Encouraged him to utilize the as needed medications and he reported he would.  Discussed with him that we did not restart the Zyprexa  yesterday due to his prolonged QTc on his EKG.  Discussed with him that the repeat EKG done this morning did show that his QTc had returned to a normal range so we would be able to start the Zyprexa  tonight and he was agreeable with this.  He does report that the Neosporin has helped with the rash on his inner thigh.  He reports no other concerns present.    Principal Problem: MDD (major depressive disorder), recurrent episode, severe (HCC) Diagnosis: Principal Problem:   MDD (major depressive disorder), recurrent episode, severe (HCC) Active Problems:   Opioid dependence with opioid-induced mood disorder (HCC)   HIV disease (HCC)   Alcohol use disorder    Sedative, hypnotic or anxiolytic use disorder, moderate, in controlled environment   Stimulant use disorder   Substance-induced psychotic disorder (HCC)  Total Time spent with patient:  I personally spent 35 minutes on the unit in direct patient care. The direct patient care time included face-to-face time with the patient, reviewing the patient's chart, communicating with other professionals, and coordinating care. Greater than 50% of this time was spent in counseling or coordinating care with the patient regarding goals of hospitalization, psycho-education, and discharge planning needs.   Past Psychiatric History:  MDD, GAD, Polysubstance Abuse (EtOH, Meth), Suicide Attempts (11/2023), a remote history of Self Injurious Behavior, and Multiple Psychiatric Hospitalizations.   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   Neuromuscular disorder (HCC)    fibromyalgia   Pulmonary embolism (HCC)    2012   SLAP tear of shoulder    left   Urticarial rash 12/05/2022   Vaccine counseling 05/09/2023    Past Surgical History:  Procedure Laterality Date   COLONOSCOPY     I & D EXTREMITY Left 07/16/2020   Procedure: IRRIGATION AND DEBRIDEMENT FOREARM;  Surgeon: Shari Easter, MD;  Location:  [facility] OR;  Service: Orthopedics;  Laterality: Left;   IRRIGATION AND DEBRIDEMENT ELBOW Right 11/21/2023   Procedure: IRRIGATION AND DEBRIDEMENT ELBOW;  Surgeon: Beuford Anes, MD;  Location: [facility];  Service: Orthopedics;  Laterality: Right;   SHOULDER ARTHROSCOPY WITH BICEPS TENDON REPAIR Left 10/22/2020   Procedure: LEFT SHOULDER ARTHROSCOPY, BICEPS TENODESIS;  Surgeon: Jerri Kay HERO, MD;  Location: [facility];  Service: Orthopedics;  Laterality: Left;   SUBACROMIAL DECOMPRESSION Left 10/22/2020   Procedure: SUBACROMIAL DECOMPRESSION;  Surgeon: Jerri Kay HERO, MD;  Location: [facility];  Service: Orthopedics;  Laterality: Left;   UPPER GI ENDOSCOPY     Family History:  Family History  Problem Relation Age of Onset   Fibromyalgia Mother    Cancer Father    Family Psychiatric  History:  None Reported  Social History:  Social History   Substance and Sexual Activity  Alcohol Use Not Currently   Alcohol/week: 30.0 standard drinks of alcohol   Types: 30 Standard drinks or equivalent per week   Comment: 1/5 a day reported by patient     Social History   Substance and Sexual Activity  Drug Use Yes   Types: Cocaine, Methamphetamines, Marijuana    Social History   Socioeconomic History   Marital status: Married    Spouse name: Not on file   Number of children: 2   Years of education: Not on file   Highest education level: Not on file  Occupational History   Not on file  Tobacco Use   Smoking status: Every Day    Types: E-cigarettes    Passive exposure: Past   Smokeless tobacco: Former    Types: Snuff  Vaping Use   Vaping status: Every Day   Substances: Nicotine   Substance and Sexual Activity   Alcohol use: Not Currently    Alcohol/week: 30.0 standard drinks of alcohol    Types: 30 Standard drinks or equivalent per week    Comment: 1/5 a day reported by patient   Drug use: Yes    Types: Cocaine, Methamphetamines, Marijuana   Sexual activity: Yes    Comment: declined condoms 09/07/23  Other Topics Concern   Not on file  Social History Narrative   Right Handed    Has two sons    Lives in a one story home    Social Drivers of Health   Financial Resource Strain: Not on file  Food Insecurity: Food Insecurity Present (12/16/2023)   Hunger Vital Sign    Worried About Running Out of Food in the Last Year:  Sometimes true    Ran Out of Food in the Last Year: Sometimes true  Transportation Needs: No Transportation Needs (12/16/2023)   PRAPARE - Administrator, Civil Service (Medical): No    Lack of Transportation (Non-Medical): No  Physical Activity: Not on file  Stress: Not on file  Social Connections: Unknown (11/18/2023)   Social Connection and Isolation Panel    Frequency of Communication with Friends and Family: Not on file    Frequency of Social Gatherings with Friends and Family: Not on file    Attends Religious Services: Not on file    Active Member of Clubs or Organizations: Not on file    Attends Banker Meetings: Not on file    Marital Status: Divorced   Additional Social History:  Sleep: Poor Estimated Sleeping Duration (Last 24 Hours): 4.75-5.00 hours due to withdrawal  Appetite:  Fair  Current Medications: Current Facility-Administered Medications  Medication Dose Route Frequency Provider Last Rate Last Admin   acetaminophen  (TYLENOL ) tablet 650 mg  650 mg Oral Q6H PRN Brent, Amanda C, NP   650 mg at 12/18/23 0312   alum & mag hydroxide-simeth (MAALOX/MYLANTA) 200-200-20 MG/5ML suspension 30 mL  30 mL Oral Q4H PRN Brent, Amanda C, NP       atorvastatin  (LIPITOR) tablet 10 mg  10 mg Oral Daily Brent, Amanda C, NP   10 mg at 12/18/23 0801   bictegravir-emtricitabine -tenofovir  AF (BIKTARVY ) 50-200-25 MG per tablet 1 tablet  1 tablet Oral Daily Thresa Alan BROCKS, NP   1 tablet at 12/18/23 0630   haloperidol  (HALDOL ) tablet 5 mg  5 mg Oral TID PRN Brent, Amanda C, NP       And   diphenhydrAMINE  (BENADRYL ) capsule 50 mg  50 mg Oral TID PRN Brent, Amanda C, NP       haloperidol  lactate (HALDOL ) injection 5 mg  5 mg Intramuscular TID PRN Thresa Alan BROCKS, NP       And   diphenhydrAMINE  (BENADRYL ) injection 50 mg  50 mg Intramuscular TID PRN Brent, Amanda C, NP       And   LORazepam  (ATIVAN ) injection 2 mg  2 mg Intramuscular  TID PRN Brent, Amanda C, NP       haloperidol  lactate (HALDOL ) injection 10 mg  10 mg Intramuscular TID PRN Brent, Amanda C, NP       And   diphenhydrAMINE  (BENADRYL ) injection 50 mg  50 mg Intramuscular TID PRN Brent, Amanda C, NP       And   LORazepam  (ATIVAN ) injection 2 mg  2 mg Intramuscular TID PRN Brent, Amanda C, NP       FLUoxetine  (PROZAC ) capsule 40 mg  40 mg Oral Daily Brent, Amanda C, NP   40 mg at 12/18/23 0801   folic acid  (FOLVITE ) tablet 1 mg  1 mg Oral Daily Brent, Amanda C, NP   1 mg at 12/18/23 0801   hydrOXYzine  (ATARAX ) tablet 25 mg  25 mg Oral Q6H PRN Brent, Amanda C, NP   25 mg at 12/17/23 1654   loperamide  (IMODIUM ) capsule 2-4 mg  2-4 mg Oral PRN Brent, Amanda C, NP       LORazepam  (ATIVAN ) tablet 1 mg  1 mg Oral Q6H PRN Brent, Amanda C, NP       losartan  (COZAAR ) tablet 25 mg  25 mg Oral Daily Brent, Amanda C, NP   25 mg at 12/18/23 0801   multivitamin with minerals tablet 1 tablet  1 tablet Oral Daily Brent, Amanda C, NP   1 tablet at 12/18/23 0801   neomycin -bacitracin -polymyxin [phone] OINT   Apply externally BID Jeremias Broyhill S, DO   Given at 12/18/23 0801   nicotine  polacrilex (NICORETTE ) gum 2 mg  2 mg Oral PRN Bobbitt, Shalon E, NP       OLANZapine  (ZYPREXA ) tablet 2.5 mg  2.5 mg Oral QHS Abigayl Hor S, DO       ondansetron  (ZOFRAN -ODT) disintegrating tablet 4 mg  4 mg Oral Q6H PRN Brent, Amanda C, NP       pantoprazole  (PROTONIX ) EC tablet 40 mg  40 mg Oral BID AC Brent, Amanda C, NP   40 mg at 12/18/23 0630   sucralfate  (CARAFATE ) 1 GM/10ML suspension 1 g  1 g Oral TID  WC Thresa Palma C, NP   1 g at 12/18/23 0630   thiamine  (Vitamin B-1) tablet 100 mg  100 mg Oral Daily Brent, Amanda C, NP   100 mg at 12/18/23 0801    Lab Results:  Results for orders placed or performed during the hospital encounter of 12/16/23 (from the past 48 hours)  Comprehensive metabolic panel     Status: Abnormal   Collection Time: 12/17/23  7:15 AM  Result  Value Ref Range   Sodium 140 135 - 145 mmol/L   Potassium 3.8 3.5 - 5.1 mmol/L   Chloride 102 98 - 111 mmol/L   CO2 26 22 - 32 mmol/L   Glucose, Bld 101 (H) 70 - 99 mg/dL    Comment: Glucose reference range applies only to samples taken after fasting for at least 8 hours.   BUN 12 6 - 20 mg/dL   Creatinine, Ser 8.96 0.61 - 1.24 mg/dL   Calcium  10.0 8.9 - 10.3 mg/dL   Total Protein 7.6 6.5 - 8.1 g/dL   Albumin 4.8 3.5 - 5.0 g/dL   AST 96 (H) 15 - 41 U/L   ALT 197 (H) 0 - 44 U/L   Alkaline Phosphatase 117 38 - 126 U/L   Total Bilirubin 1.2 0.0 - 1.2 mg/dL   GFR, Estimated >39 >39 mL/min    Comment: (NOTE) Calculated using the CKD-EPI Creatinine Equation (2021)    Anion gap 12 5 - 15    Comment: Performed at [facility], [address], [place], [place] [address]    Blood Alcohol level:  Lab Results  Component Value Date   ETH 81 (H) 12/15/2023   ETH <15 11/17/2023    Metabolic Disorder Labs: Lab Results  Component Value Date   HGBA1C 5.3 12/15/2023   MPG 105.41 12/15/2023   No results found for: PROLACTIN Lab Results  Component Value Date   CHOL 155 12/15/2023   TRIG 153 (H) 12/15/2023   HDL 38 (L) 12/15/2023   CHOLHDL 4.1 12/15/2023   VLDL 31 12/15/2023   LDLCALC 86 12/15/2023   LDLCALC 88 09/07/2023    Physical Findings: AIMS:  ,  ,  ,  ,  ,  ,   CIWA:  CIWA-Ar Total: 0 COWS:     Musculoskeletal: Strength & Muscle Tone: within normal limits Gait & Station: laying in bed Patient leans: N/A  Psychiatric Specialty Exam:  Presentation  General Appearance:  Appropriate for Environment; Casual  Eye Contact: Fleeting  Speech: Clear and Coherent; Normal Rate  Speech Volume: Normal  Handedness: Right   Mood and Affect  Mood: Dysphoric  Affect: Congruent   Thought Process  Thought Processes: Coherent; Goal Directed  Descriptions of Associations:Intact  Orientation:Full (Time, Place and Person)  Thought  Content:Logical; WDL  History of Schizophrenia/Schizoaffective disorder:No  Duration of Psychotic Symptoms:N/A  Hallucinations:Hallucinations: None  Ideas of Reference:None  Suicidal Thoughts:Suicidal Thoughts: No  Homicidal Thoughts:Homicidal Thoughts: No   Sensorium  Memory: Immediate Fair; Recent Fair  Judgment: Fair  Insight: Fair   Art therapist  Concentration: Fair  Attention Span: Fair  Recall: [employer] of Knowledge: Fair  Language: Fair   Psychomotor Activity  Psychomotor Activity:Psychomotor Activity: Normal   Assets  Assets: Manufacturing systems engineer; Resilience; Desire for Improvement   Sleep  Sleep:Sleep: Poor    Physical Exam: Physical Exam Vitals and nursing note reviewed.  Constitutional:      General: He is not in acute distress.    Appearance: Normal appearance.  He is obese. He is not ill-appearing or toxic-appearing.  HENT:     Head: Normocephalic and atraumatic.  Pulmonary:     Effort: Pulmonary effort is normal.  Musculoskeletal:        General: Normal range of motion.  Neurological:     General: No focal deficit present.     Mental Status: He is alert.    Review of Systems  Constitutional:  Positive for diaphoresis.  Respiratory:  Negative for cough and shortness of breath.   Cardiovascular:  Negative for chest pain.  Gastrointestinal:  Positive for diarrhea and nausea. Negative for abdominal pain, constipation and vomiting.  Neurological:  Positive for dizziness and headaches. Negative for weakness.  Psychiatric/Behavioral:  Positive for depression. Negative for hallucinations and suicidal ideas. The patient is nervous/anxious.    Blood pressure (!) 127/97, pulse 73, temperature 98.5 F (36.9 C), temperature source Oral, resp. rate 18, height 5' 6 (1.676 m), weight 84.5 kg, SpO2 98%. Body mass index is 30.05 kg/m.   Treatment Plan Summary: Daily contact with patient to assess and evaluate symptoms and  progress in treatment and Medication management  Jimmy Fisher is a 43 yr old male who presented on 7/3 to [facility] requesting Detox after a multi-day binge, he was admitted to [facility] on 7/4.  PPHx is significant for MDD, GAD, Polysubstance Abuse (EtOH, Meth), Suicide Attempts (11/2023), a remote history of Self Injurious Behavior, and Multiple Psychiatric Hospitalizations.    Jimmy Fisher has had some worsening of his withdrawal symptoms.  Encouraged him to utilize PRN medications.  He had asked for his Zyprexa  to be restarted yesterday but his last EKG showed a prolonged Qtc.  His repeat EKG shows NSR w/ Qtc: 444 so we will start Zyprexa .  We will not make any other changes to his medications at this time.  We will continue to monitor.    MDD, Recurrent, Severe, w/out Psychosis  GAD: -Continue Prozac  40 mg daily for depression and anxiety -Start Zyprexa  2.5 mg QHS for augmentation and mood stability -Continue Agitation Protocol: Haldol /Ativan /Benadryl    Withdrawal: -Continue CIWA, last score= 0  @ 2035  7/5 -Continue Ativan  1 mg q6 PRN CIWA>10 -Continue Bentyl  20 mg q6 PRN spasms -Continue Imodium  2-4 mg PRN diarrhea -Continue Zofran -ODT 4 mg q6 PRN nausea -Continue Thiamine  100 mg daily for nutritional supplementation -Continue Multivitamin daily for nutritional supplementation -Continue Folic Acid  1 mg daily for nutritional supplementation   HIV: -Continue Biktarvy  50-200-25 mg daily   Nicotine  Dependence: -Continue Nicotine  Patch 21 mg daily   -Start Neosporin BID for thigh rash -Continue Lipitor 10 mg daily -Continue Losartan  25 mg daily -Continue Protonix  EC 40 mg daily -Continue Carafate  1 g TID -Continue PRN's: Tylenol , Maalox, Atarax , Milk of Magnesia   --  The risks/benefits/side-effects/alternatives to medications were discussed in detail with the patient and time was given for questions. The patient consents to medication trials.                -- Metabolic profile  and EKG monitoring obtained while on an atypical antipsychotic (Lipid Panel: WNL except Trig: 153,  HDL: 38, A1c: 5.3 EKG: NSR w/ QTc: 524, EKG(7/6): NSR w/ Qtc: 444)              -- Encouraged patient to participate in unit milieu and in scheduled group therapies              -- Short Term Goals: Ability to identify changes in lifestyle to reduce recurrence of  condition will improve, Ability to verbalize feelings will improve, Ability to disclose and discuss suicidal ideas, Ability to demonstrate self-control will improve, Ability to identify and develop effective coping behaviors will improve, Ability to maintain clinical measurements within normal limits will improve, Compliance with prescribed medications will improve, and Ability to identify triggers associated with substance abuse/mental health issues will improve             -- Long Term Goals: Improvement in symptoms so as ready for discharge   Safety and Monitoring:             -- Voluntary admission to inpatient psychiatric unit for safety, stabilization and treatment             -- Daily contact with patient to assess and evaluate symptoms and progress in treatment             -- Patient's case to be discussed in multi-disciplinary team meeting             -- Observation Level : q15 minute checks             -- Vital signs:  q12 hours             -- Precautions: suicide, elopement, and assault  Discharge Planning:              -- Social work and case management to assist with discharge planning and identification of hospital follow-up needs prior to discharge             -- Estimated LOS: 3-5 more days             -- Discharge Concerns: Need to establish a safety plan; Medication compliance and effectiveness             -- Discharge Goals: Return home with outpatient referrals for mental health follow-up including medication management/psychotherapy   Marsa GORMAN Rosser, DO 12/18/2023, 10:53 AM

## 2023-12-18 NOTE — Group Note (Signed)
 Date:  12/18/2023 Time:  9:23 AM  Group Topic/Focus:  Goals Group:   The focus of this group is to help patients establish daily goals to achieve during treatment and discuss how the patient can incorporate goal setting into their daily lives to aide in recovery.    Participation Level:  Did Not Attend  Participation Quality:    Affect:    Cognitive:    Insight: None  Engagement in Group:    Modes of Intervention:    Additional Comments:    Asberry CROME Sedale Jenifer 12/18/2023, 9:23 AM

## 2023-12-18 NOTE — [facility] Group Notes (Signed)
 Psychoeducational Group Note  Date:  12/18/2023 Time:  2000  Group Topic/Focus:  Wrap up group  Participation Level: Did Not Attend  Participation Quality:  Not Applicable  Affect:  Not Applicable  Cognitive:  Not Applicable  Insight:  Not Applicable  Engagement in Group: Not Applicable  Additional Comments:  Did not attend.   Lenora Shaver S 12/18/2023, 9:30 PM

## 2023-12-18 NOTE — Plan of Care (Signed)
   Problem: Education: Goal: Emotional status will improve Outcome: Progressing Goal: Mental status will improve Outcome: Progressing Goal: Verbalization of understanding the information provided will improve Outcome: Progressing

## 2023-12-18 NOTE — Group Note (Signed)
 LCSW Group Therapy Note  Group Date: 12/18/2023 Start Time: 1030 End Time: 1130   Type of Therapy and Topic:  Group Therapy - Stress/ Healthy vs Unhealthy Coping Skills  Participation Level:  Did Not Attend   Description of Group The focus of this group was to determine what unhealthy coping techniques typically are used by group members and what healthy coping techniques would be helpful in coping with various problems. Patients were guided in becoming aware of the differences between healthy and unhealthy coping techniques. Patients were asked to identify 2-3 healthy coping skills they would like to learn to use more effectively.  Therapeutic Goals Patients learned that coping is what human beings do all day long to deal with various situations in their lives Patients defined and discussed healthy vs unhealthy coping techniques Patients identified their preferred coping techniques and identified whether these were healthy or unhealthy Patients determined 2-3 healthy coping skills they would like to become more familiar with and use more often. Patients provided support and ideas to each other   Summary of Patient Progress:  did not attend   Therapeutic Modalities Cognitive Behavioral Therapy Motivational Interviewing  Golda Louder, LCSWA 12/18/2023  4:06 PM

## 2023-12-18 NOTE — Plan of Care (Signed)
  Problem: Activity: Goal: Sleeping patterns will improve Outcome: Progressing   

## 2023-12-18 NOTE — Progress Notes (Signed)
   12/18/23 0801  Psych Admission Type (Psych Patients Only)  Admission Status Voluntary  Psychosocial Assessment  Patient Complaints Irritability;Anxiety  Eye Contact Fair  Facial Expression Flat  Affect Irritable  Speech Logical/coherent  Interaction Minimal  Motor Activity Slow  Appearance/Hygiene Unremarkable  Behavior Characteristics Irritable  Mood Depressed;Anxious  Thought Process  Coherency WDL  Content WDL  Delusions None reported or observed  Perception WDL  Hallucination None reported or observed  Judgment Impaired  Confusion None  Danger to Self  Current suicidal ideation? Denies  Agreement Not to Harm Self Yes  Description of Agreement Verbal  Danger to Others  Danger to Others None reported or observed

## 2023-12-18 NOTE — Progress Notes (Signed)
 Patient refused hydroxyzine . States, I just want to be left alone, please.  12/18/23 1340  CIWA-Ar  BP (!) 146/96  Pulse Rate 74  Nausea and Vomiting 1  Tactile Disturbances 0  Tremor 0  Auditory Disturbances 1  Paroxysmal Sweats 0  Visual Disturbances 1  Anxiety 2  Headache, Fullness in Head 2  Agitation 0  Orientation and Clouding of Sensorium 0  CIWA-Ar Total 7

## 2023-12-18 NOTE — Progress Notes (Signed)
   12/18/23 2042  Psych Admission Type (Psych Patients Only)  Admission Status Voluntary  Psychosocial Assessment  Patient Complaints None  Eye Contact Fair  Facial Expression Flat  Affect Appropriate to circumstance  Speech Logical/coherent  Interaction Isolative  Motor Activity Slow  Appearance/Hygiene In scrubs  Behavior Characteristics Cooperative;Appropriate to situation  Mood Sad  Thought Process  Coherency WDL  Content WDL  Delusions None reported or observed  Perception WDL  Hallucination None reported or observed  Judgment Impaired  Confusion None  Danger to Self  Current suicidal ideation? Denies  Danger to Others  Danger to Others None reported or observed

## 2023-12-19 DIAGNOSIS — F1124 Opioid dependence with opioid-induced mood disorder: Secondary | ICD-10-CM

## 2023-12-19 DIAGNOSIS — F1320 Sedative, hypnotic or anxiolytic dependence, uncomplicated: Secondary | ICD-10-CM

## 2023-12-19 DIAGNOSIS — B20 Human immunodeficiency virus [HIV] disease: Secondary | ICD-10-CM

## 2023-12-19 DIAGNOSIS — F1090 Alcohol use, unspecified, uncomplicated: Secondary | ICD-10-CM

## 2023-12-19 DIAGNOSIS — F1994 Other psychoactive substance use, unspecified with psychoactive substance-induced mood disorder: Secondary | ICD-10-CM

## 2023-12-19 MED ORDER — ONDANSETRON 4 MG PO TBDP: 4.0000 mg | ORAL_TABLET | Freq: Four times a day (QID) | ORAL | Status: DC | PRN

## 2023-12-19 MED ORDER — LORAZEPAM 1 MG PO TABS: 1.0000 mg | ORAL_TABLET | Freq: Four times a day (QID) | ORAL | Status: DC | PRN

## 2023-12-19 MED ORDER — HYDROXYZINE HCL 25 MG PO TABS
25.0000 mg | ORAL_TABLET | Freq: Three times a day (TID) | ORAL | Status: DC | PRN
  Administered 2023-12-19 – 2023-12-20 (×2): 25 mg via ORAL
  Filled 2023-12-19 (×2): qty 1

## 2023-12-19 MED ORDER — OLANZAPINE 5 MG PO TABS
5.0000 mg | ORAL_TABLET | Freq: Every day | ORAL | Status: DC
  Administered 2023-12-19 – 2023-12-20 (×2): 5 mg via ORAL
  Filled 2023-12-19 (×2): qty 1
  Filled 2023-12-19: qty 14

## 2023-12-19 MED ORDER — LOPERAMIDE HCL 2 MG PO CAPS: 2.0000 mg | ORAL_CAPSULE | ORAL | Status: DC | PRN

## 2023-12-19 NOTE — Progress Notes (Signed)
 Tour of Duty:  Jimmy JINNY Angle, RN, 12/19/23, Tour of Duty: 0700-1900  SI/HI/AVH: Denies  Self-Reported   Mood: Negative  Anxiety: Endorses Depression: Endorses Irritability: Endorses  Broset  Violence Prevention Guidelines *See Row Information*: (not recorded)   LBM  Last BM Date : 12/18/23   Pain: present, PRN provided (see MAR)  Patient Refusals (including Rx): Yes, including not rousing ina  timely manner for morning medications  Shift Summary: Patient observed to be withdrawn and irritable on unit. Patient able to make needs known. Patient observed to engage inappropriately with staff and peers, would not rouse at staff request and withdrawn from peers. Patient taking medications as prescribed, though not on time. This shift, PRN medication requested for HA 6/10. No observed or reported side effects to medication. No observed or reported agitation, aggression, or other acute emotional distress. No observed or reported physical abnormalities or concerns. CIWA score of 5.  Last Vitals  Vitals Weight: 84.5 kg Temp: 98.5 F (36.9 C) Temp Source: Oral Pulse Rate: 69 Resp: 18 BP: 123/71 Patient Position: (not recorded)  Admission Type  Psych Admission Type (Psych Patients Only) Admission Status: Voluntary Date 72 hour document signed : (not recorded) Time 72 hour document signed : (not recorded) Provider Notified (First and Last Name) (see details for LINK to note): (not recorded)   Psychosocial Assessment  Psychosocial Assessment Patient Complaints: Anxiety, Irritability, Depression Eye Contact: Brief Facial Expression: Sullen Affect: Irritable Speech: Logical/coherent Interaction: Isolative Motor Activity: Other (Comment) (WDL) Appearance/Hygiene: Unremarkable Attention Span: (not recorded) Behavior Characteristics: Unwilling to participate, Guarded, Irritable Mood: Irritable   Aggressive Behavior  Targets: (not recorded)   Thought Process  Thought  Process Coherency: Within Defined Limits Content: Within Defined Limits Delusions: None reported or observed Perception: Within Defined Limits Hallucination: None reported or observed Judgment: Poor Confusion: None  Danger to Self/Others  Danger to Self Current suicidal ideation?: Denies Description of Suicide Plan: (not recorded) Self-Injurious Behavior: (not recorded) Agreement Not to Harm Self: Yes Description of Agreement: verbal Danger to Others: None reported or observed

## 2023-12-19 NOTE — Group Note (Signed)
 LCSW Group Therapy Note   Group Date: 12/19/2023 Start Time: 1300 End Time: 1400   Participation:  did not attend   Type of Therapy:  Group Therapy  Topic:  Finding Balance: Using Wise Mind for Thoughtful Decisions  Objective:  the objective of this class is to help participants understand the concept of Wise Mind and learn how to apply it to real-life situations to make balanced, thoughtful decisions. Participants will gain tools to manage emotions, consider logic, and find a middle ground that leads to healthier responses and outcomes.  Goals: Understand the concept of Wise Mind.  Participants will learn the difference between Emotional Mind, Reasonable Mind, and Delsie Mind, and how Delsie Mind helps in balancing emotions and logic to make thoughtful decisions. Recognize when you're in Emotional Mind or Reasonable Mind.  Participants will identify the signs of Emotional Mind and Reasonable Mind in their own reactions to situations and understand how to move into Wise Mind for more balanced responses. Practice applying Delsie Mind to real-life situations.  Through scenarios and group activities, participants will practice using Wise Mind in everyday situations, learning how to acknowledge their emotions, think logically, and create solutions that are thoughtful and balanced.  Summary:  In this class, we explored the concept of Wise Mind--the balance between Emotional Mind and Reasonable Mind. We discussed how Emotional Mind can sometimes lead to impulsive, reactive decisions driven by intense feelings, and how Reasonable Mind might ignore feelings altogether, focusing only on facts and logic. Delsie Mind is the middle ground that combines both, allowing you to consider your emotions and use logic to make balanced, thoughtful decisions.  We learned how to recognize when we're in Emotional Mind or Reasonable Mind, and practiced using Wise Mind in real-life situations. By using Delsie Smiles, we can improve  how we handle challenging situations, make better decisions, and strengthen our relationships with others.   Jimmy Fisher, LCSWA 12/19/2023  6:43 PM

## 2023-12-19 NOTE — Plan of Care (Signed)
   Problem: Education: Goal: Emotional status will improve Outcome: Not Progressing Goal: Mental status will improve Outcome: Not Progressing

## 2023-12-19 NOTE — Group Note (Signed)
 Date:  12/19/2023 Time:  2:17 PM  Group Topic/Focus:  Goals Group:   The focus of this group is to help patients establish daily goals to achieve during treatment and discuss how the patient can incorporate goal setting into their daily lives to aide in recovery. Orientation:   The focus of this group is to educate the patient on the purpose and policies of crisis stabilization and provide a format to answer questions about their admission.  The group details unit policies and expectations of patients while admitted.    Participation Level:  Did Not Attend   Jimmy Fisher Molly 12/19/2023, 2:17 PM

## 2023-12-19 NOTE — Progress Notes (Signed)
 Collateral contact - [facility], [address] [place], [place] [address], [phone]   Rosaline said that she will review the referral tomorrow, Tuesday, 7/8.   Mable Lashley, LCSWA 12/19/2023

## 2023-12-19 NOTE — Plan of Care (Signed)
   Problem: Education: Goal: Knowledge of [facility] Education information/materials will improve Outcome: Progressing Goal: Emotional status will improve Outcome: Progressing Goal: Mental status will improve Outcome: Progressing

## 2023-12-19 NOTE — Plan of Care (Signed)

## 2023-12-19 NOTE — Group Note (Unsigned)
 Date:  12/19/2023 Time:  2:45 PM  Group Topic/Focus:  Goals Group:   The focus of this group is to help patients establish daily goals to achieve during treatment and discuss how the patient can incorporate goal setting into their daily lives to aide in recovery. Orientation:   The focus of this group is to educate the patient on the purpose and policies of crisis stabilization and provide a format to answer questions about their admission.  The group details unit policies and expectations of patients while admitted.     Participation Level:  {BHH PARTICIPATION OZCZO:77735}  Participation Quality:  {BHH PARTICIPATION QUALITY:22265}  Affect:  {BHH AFFECT:22266}  Cognitive:  {BHH COGNITIVE:22267}  Insight: {BHH Insight2:20797}  Engagement in Group:  {BHH ENGAGEMENT IN HMNLE:77731}  Modes of Intervention:  {BHH MODES OF INTERVENTION:22269}  Additional Comments:  ***  Garwood Wentzell L Ludmila Ebarb 12/19/2023, 2:45 PM

## 2023-12-19 NOTE — Progress Notes (Addendum)
 Collateral contact   CSW emailed a letter excusing patient from attending the court hearing on 7/7 (today) [email or web address] via secure email.  Letter was also uploaded on the court website.    (patient signed the ROI)   Brandy Kabat, LCSWA 12/19/2023

## 2023-12-19 NOTE — Group Note (Signed)
 Date:  12/19/2023 Time:  8:34 PM  Group Topic/Focus:  Wrap-Up Group:   The focus of this group is to help patients review their daily goal of treatment and discuss progress on daily workbooks.    Participation Level:  Did Not Attend   Jimmy Fisher 12/19/2023, 8:34 PM

## 2023-12-19 NOTE — Group Note (Signed)
 Recreation Therapy Group Note   Group Topic:Coping Skills  Group Date: 12/19/2023 Start Time: 1010 End Time: 1045 Facilitators: Celena Lanius-McCall, LRT,CTRS Location: 500 Hall Dayroom   Group Topic: Coping Skills  Goal Area(s) Addresses:  Patient will identify positive coping skills. Patient will identify benefits of using coping skills post d/c.  Behavioral Response:   Intervention: Worksheet, Brain storming  Activity: Mind Map. Patient was provided a blank template of a diagram with 32 blank boxes in a tiered system, branching from the center (similar to a bubble chart). LRT directed patients to label the middle of the diagram Coping Skills and consider 8 different situations that would require the use of coping skills. LRT and patients filled in the first 8 boxes together (anxiety, irritability, depression, aggravation, substance use, mean people, ignorant people and hiding feelings/speaking truth). Patients were to then come up with 3 positive coping skills to address each identified area in the remaining boxes stemming from a particular instance. LRT would then write the coping skills of patients on the board once patients were finished filling out their sheets.   Education: Pharmacologist, Building control surveyor.   Education Outcome:  Acknowledges Education   Affect/Mood: N/A   Participation Level: Did not attend    Clinical Observations/Individualized Feedback:    Plan: Continue to engage patient in RT group sessions 2-3x/week.   Jimmy Fisher, LRT,CTRS 12/19/2023 1:20 PM

## 2023-12-19 NOTE — Progress Notes (Signed)
 [facility] MD Progress Note  12/19/2023 12:58 PM Jimmy Fisher  MRN:  [id number]  Principal Problem: MDD (major depressive disorder), recurrent episode, severe (HCC) Diagnosis: Principal Problem:   MDD (major depressive disorder), recurrent episode, severe (HCC) Active Problems:   Opioid dependence with opioid-induced mood disorder (HCC)   HIV disease (HCC)   Alcohol use disorder   Sedative, hypnotic or anxiolytic use disorder, moderate, in controlled environment   Stimulant use disorder   Substance-induced psychotic disorder (HCC)   Reason for Admission:  Jimmy Fisher is a 43 yr old male who presented on 7/3 to [facility] requesting Detox after a multi-day binge, he was admitted to [facility] on 7/4.  PPHx is significant for MDD, GAD, Polysubstance Abuse (EtOH, Meth), Suicide Attempts (11/2023), a remote history of Self Injurious Behavior, and Multiple Psychiatric Hospitalizations.  (admitted on 12/16/2023, total  LOS: 3 days )   ASSESSMENT: Patient was transferred to 500 hall due to reported homicidal ideation towards roommate.  This appeared to be secondary to roommate not wearing clothes appropriately and having thermostat turned up to 80 degrees. Increasing zyprexa  to aid with irritability. He is experiencing mild withdrawal symptoms but is sufficiently stable to go to [facility] when there is a bed available.    PLAN: MDD, Recurrent, Severe, w/out Psychosis  GAD: -Continue Prozac  40 mg daily for depression and anxiety -Increase Zyprexa  to 5 mg QHS for augmentation and mood stability -Continue Agitation Protocol: Haldol /Ativan /Benadryl      Withdrawal: -Continue CIWA, last score= 5  @ 7/7 -Continue Ativan  1 mg q6 PRN CIWA>10 -Continue Bentyl  20 mg q6 PRN spasms -Continue Imodium  2-4 mg PRN diarrhea -Continue Zofran -ODT 4 mg q6 PRN nausea -Continue Thiamine  100 mg daily for nutritional supplementation -Continue Multivitamin daily for nutritional supplementation -Continue Folic Acid  1 mg  daily for nutritional supplementation    HIV: -Continue Biktarvy  50-200-25 mg daily     Nicotine  Dependence: -Continue Nicotine  Patch 21 mg daily     -Start Neosporin BID for thigh rash -Continue Lipitor 10 mg daily -Continue Losartan  25 mg daily -Continue Protonix  EC 40 mg daily -Continue Carafate  1 g TID -Continue PRN's: Tylenol , Maalox, Atarax , Milk of Magnesia   Disposition Planning: -- Estimated LOS: 5 days --Estimated Discharge Date: 7/10 --Barriers to Discharge: residential rehab placement, mood stability -- Discharge Goals: Return home with outpatient referrals for mental health follow-up including medication management/psychotherapy  Subjective: The patient was seen and evaluated on the unit. On assessment today the patient reports he did not know he was being transferred to 512 due to making homicidal remarks towards roommate.  He adamantly denies ever making those threats but felt that his roommate was very inconsiderate given roommate would regularly have his bottom displayed and make a mess of the room.  He would also have the temperature in the room set to 80 degrees which was very uncomfortable for patient.  Patient currently feels well and denies any side effects from initiation of Zyprexa .  He reports mild withdrawal symptoms of headache, irritability, and myalgia but no other acute complaints.  He continues to be amenable to going to [facility] residential rehab   Objective: Chart Review from last 24 hours:  The patient's chart was reviewed and nursing notes were reviewed. The patient's case was discussed in multidisciplinary team meeting.  - Overnight events to report per chart review / staff report: RN reports patient made threatening remarks towards roommate and was moved - Patient took all prescribed medications yes - Patient received the following  PRNs: hydroxyzine  and tylenol   Current Medications: Current Facility-Administered Medications  Medication Dose  Route Frequency Provider Last Rate Last Admin   acetaminophen  (TYLENOL ) tablet 650 mg  650 mg Oral Q6H PRN Brent, Amanda C, NP   650 mg at 12/19/23 1154   alum & mag hydroxide-simeth (MAALOX/MYLANTA) 200-200-20 MG/5ML suspension 30 mL  30 mL Oral Q4H PRN Brent, Amanda C, NP       atorvastatin  (LIPITOR) tablet 10 mg  10 mg Oral Daily Brent, Amanda C, NP   10 mg at 12/19/23 1147   bictegravir-emtricitabine -tenofovir  AF (BIKTARVY ) 50-200-25 MG per tablet 1 tablet  1 tablet Oral Daily Brent, Amanda C, NP   1 tablet at 12/19/23 9361   haloperidol  (HALDOL ) tablet 5 mg  5 mg Oral TID PRN Brent, Amanda C, NP       And   diphenhydrAMINE  (BENADRYL ) capsule 50 mg  50 mg Oral TID PRN Brent, Amanda C, NP       haloperidol  lactate (HALDOL ) injection 5 mg  5 mg Intramuscular TID PRN Brent, Amanda C, NP       And   diphenhydrAMINE  (BENADRYL ) injection 50 mg  50 mg Intramuscular TID PRN Brent, Amanda C, NP       And   LORazepam  (ATIVAN ) injection 2 mg  2 mg Intramuscular TID PRN Brent, Amanda C, NP       haloperidol  lactate (HALDOL ) injection 10 mg  10 mg Intramuscular TID PRN Thresa Alan BROCKS, NP       And   diphenhydrAMINE  (BENADRYL ) injection 50 mg  50 mg Intramuscular TID PRN Brent, Amanda C, NP       And   LORazepam  (ATIVAN ) injection 2 mg  2 mg Intramuscular TID PRN Brent, Amanda C, NP       FLUoxetine  (PROZAC ) capsule 40 mg  40 mg Oral Daily Brent, Amanda C, NP   40 mg at 12/19/23 1147   folic acid  (FOLVITE ) tablet 1 mg  1 mg Oral Daily Brent, Amanda C, NP   1 mg at 12/19/23 1147   hydrOXYzine  (ATARAX ) tablet 25 mg  25 mg Oral TID PRN Raliegh Marsa RAMAN, DO       loperamide  (IMODIUM ) capsule 2-4 mg  2-4 mg Oral PRN Pashayan, Alexander S, DO       LORazepam  (ATIVAN ) tablet 1 mg  1 mg Oral Q6H PRN Pashayan, Alexander S, DO       losartan  (COZAAR ) tablet 25 mg  25 mg Oral Daily Brent, Amanda C, NP   25 mg at 12/19/23 1147   multivitamin with minerals tablet 1 tablet  1 tablet Oral Daily Brent, Amanda C,  NP   1 tablet at 12/19/23 1147   neomycin -bacitracin -polymyxin [phone] OINT   Apply externally BID Pashayan, Alexander S, DO   Given at 12/19/23 1148   nicotine  polacrilex (NICORETTE ) gum 2 mg  2 mg Oral PRN Bobbitt, Shalon E, NP   2 mg at 12/18/23 2036   OLANZapine  (ZYPREXA ) tablet 2.5 mg  2.5 mg Oral QHS Pashayan, Alexander S, DO   2.5 mg at 12/18/23 2124   ondansetron  (ZOFRAN -ODT) disintegrating tablet 4 mg  4 mg Oral Q6H PRN Pashayan, Alexander S, DO       pantoprazole  (PROTONIX ) EC tablet 40 mg  40 mg Oral BID AC Brent, Amanda C, NP   40 mg at 12/19/23 9361   sucralfate  (CARAFATE ) 1 GM/10ML suspension 1 g  1 g Oral TID WC Brent, Amanda C, NP   1 g  at 12/19/23 1153   thiamine  (Vitamin B-1) tablet 100 mg  100 mg Oral Daily Brent, Amanda C, NP   100 mg at 12/19/23 1148    Lab Results: No results found for this or any previous visit (from the past 48 hours).  Blood Alcohol level:  Lab Results  Component Value Date   ETH 81 (H) 12/15/2023   ETH <15 11/17/2023    Metabolic Labs: Lab Results  Component Value Date   HGBA1C 5.3 12/15/2023   MPG 105.41 12/15/2023   No results found for: PROLACTIN Lab Results  Component Value Date   CHOL 155 12/15/2023   TRIG 153 (H) 12/15/2023   HDL 38 (L) 12/15/2023   CHOLHDL 4.1 12/15/2023   VLDL 31 12/15/2023   LDLCALC 86 12/15/2023   LDLCALC 88 09/07/2023    Physical Findings: AIMS: No  CIWA:  CIWA-Ar Total: 5 COWS:     Psychiatric Specialty Exam: General Appearance: Appropriate for Environment; Casual   Eye Contact: Fleeting   Speech: Clear and Coherent; Normal Rate   Volume: Normal   Mood: Dysphoric   Affect: Congruent   Thought Content: Logical; WDL   Suicidal Thoughts: Suicidal Thoughts: No   Homicidal Thoughts: Homicidal Thoughts: No   Thought Process: Coherent; Goal Directed   Orientation: Full (Time, Place and Person)     Memory: Immediate Fair; Recent Fair   Judgment: Fair   Insight: Fair    Concentration: Fair   Recall: Fair   Fund of Knowledge: Fair   Language: Fair   Psychomotor Activity: Psychomotor Activity: Normal   Assets: Communication Skills; Resilience; Desire for Improvement   Sleep: Sleep: Poor    Review of Systems ROS  Vital Signs: Blood pressure 123/71, pulse 69, temperature 98.5 F (36.9 C), temperature source Oral, resp. rate 18, height 5' 6 (1.676 m), weight 84.5 kg, SpO2 97%. Body mass index is 30.05 kg/m. Physical Exam  I certify that inpatient services furnished can reasonably be expected to improve the patient's condition.   Signed: Prentice Espy, MD 12/19/2023, 12:58 PM

## 2023-12-20 MED ORDER — LOSARTAN POTASSIUM 25 MG PO TABS: 25.0000 mg | ORAL_TABLET | Freq: Every day | ORAL | 0 refills | Status: DC

## 2023-12-20 MED ORDER — FLUOXETINE HCL 40 MG PO CAPS: 40.0000 mg | ORAL_CAPSULE | Freq: Every day | ORAL | 0 refills | Status: DC

## 2023-12-20 MED ORDER — OLANZAPINE 5 MG PO TABS: 5.0000 mg | ORAL_TABLET | Freq: Every day | ORAL | 0 refills | Status: DC

## 2023-12-20 MED ORDER — SUCRALFATE 1 G PO TABS
1.0000 g | ORAL_TABLET | Freq: Three times a day (TID) | ORAL | Status: DC
  Filled 2023-12-20: qty 13

## 2023-12-20 MED ORDER — ATORVASTATIN CALCIUM 10 MG PO TABS: 10.0000 mg | ORAL_TABLET | Freq: Every day | ORAL | 0 refills | Status: DC

## 2023-12-20 MED ORDER — BIKTARVY 50-200-25 MG PO TABS: 1.0000 | ORAL_TABLET | Freq: Every day | ORAL | 0 refills | Status: DC

## 2023-12-20 MED ORDER — SUCRALFATE 1 G PO TABS: 1.0000 g | ORAL_TABLET | Freq: Three times a day (TID) | ORAL | Status: DC

## 2023-12-20 MED ORDER — OMEPRAZOLE 20 MG PO CPDR: 20.0000 mg | DELAYED_RELEASE_CAPSULE | Freq: Two times a day (BID) | ORAL | 0 refills | Status: AC

## 2023-12-20 MED ORDER — SUCRALFATE 1 GM/10ML PO SUSP: 1.0000 g | Freq: Three times a day (TID) | ORAL | 0 refills | Status: DC

## 2023-12-20 NOTE — Progress Notes (Signed)
 Collateral contact - [facility] - [facility], [address] [place], [place] [address], [phone]   Rosaline from [place] called and informed that patient was accepted into the program.  She requested a 30-day supply of medications.  She said patient can come on Thursday, 7/10 by 9 AM.     Jamilya Sarrazin, LCSWA 12/20/2023

## 2023-12-20 NOTE — Plan of Care (Signed)
   Problem: Education: Goal: Emotional status will improve Outcome: Progressing Goal: Mental status will improve Outcome: Progressing Goal: Verbalization of understanding the information provided will improve Outcome: Progressing

## 2023-12-20 NOTE — Progress Notes (Signed)
 Jimmy Fisher did not attend wrap up group.

## 2023-12-20 NOTE — Progress Notes (Signed)
 Brief Pharmacy note: Discharge meds  Patient has greater than a 14 day supply of home medications in Backpack for: Biktarvy , Folic Acid  1mg , Thiamine  100mg , Atorvastatin  10mg , Fluoxetine  40mg , and MVI, at discharge would like to use his Olmesartan  20mg  instead of Losartan , his Omeprazole  20mg  instead of Protonix . Have provided 14 days of Olanzapine  5mg , and 13 more tabs of Sucralfate  1gm.   Thank you   Landy Veva CROME PharmD 12/20/2023, 11:36 AM

## 2023-12-20 NOTE — Progress Notes (Signed)
   12/20/23 0955  Psych Admission Type (Psych Patients Only)  Admission Status Voluntary  Psychosocial Assessment  Patient Complaints Substance abuse  Eye Contact Fair  Facial Expression Animated  Affect Appropriate to circumstance  Speech Logical/coherent  Interaction Assertive  Motor Activity Other (Comment) (WDL)  Appearance/Hygiene Unremarkable  Behavior Characteristics Cooperative  Mood Sad  Thought Process  Coherency WDL  Content WDL  Delusions None reported or observed  Perception WDL  Hallucination None reported or observed  Judgment Impaired  Confusion None  Danger to Self  Current suicidal ideation? Denies  Agreement Not to Harm Self Yes  Description of Agreement Verbal  Danger to Others  Danger to Others None reported or observed

## 2023-12-20 NOTE — Progress Notes (Signed)
 Conversation with patient  CSW gave patient a list of shelter resources and substance use treatment facilities.  CSW encouraged him to call.  Patient hopes to go to [facility].  He said that he believes he was court ordered to go to [facility].  Patient said if he doesn't have anywhere to go, his mom would pick him up from the hospital.  He can't stay with her but she would pick him up if needed.   Collateral contact   Rocky Bray (attorney)  [employer] [phone]  CSW left a voicemail for patient's attorney.   Jalecia Leon, LCSWA 12/20/2023

## 2023-12-20 NOTE — Group Note (Signed)
 LCSW Group Therapy Note   Group Date: 12/20/2023 Start Time: 1100 End Time: 1200   Participation:  patient was moved to 400 hall (from 500) after the group was held  Type of Therapy:  Group Therapy  Topic:  Finding Balance: Using [employer] for Thoughtful Decisions  Objective:  the objective of this class is to help participants understand the concept of Wise Mind and learn how to apply it to real-life situations to make balanced, thoughtful decisions. Participants will gain tools to manage emotions, consider logic, and find a middle ground that leads to healthier responses and outcomes.  Goals: Understand the concept of Wise Mind.  Participants will learn the difference between Emotional Mind, Reasonable Mind, and Delsie Mind, and how Delsie Mind helps in balancing emotions and logic to make thoughtful decisions. Recognize when you're in Emotional Mind or Reasonable Mind.  Participants will identify the signs of Emotional Mind and Reasonable Mind in their own reactions to situations and understand how to move into Wise Mind for more balanced responses. Practice applying Delsie Mind to real-life situations.  Through scenarios and group activities, participants will practice using Wise Mind in everyday situations, learning how to acknowledge their emotions, think logically, and create solutions that are thoughtful and balanced.  Summary:  In this class, we explored the concept of Wise Mind - the balance between Emotional Mind and Reasonable Mind. We discussed how Emotional Mind can sometimes lead to impulsive, reactive decisions driven by intense feelings, and how Reasonable Mind might ignore feelings altogether, focusing only on facts and logic. Delsie Mind is the middle ground that combines both, allowing you to consider your emotions and use logic to make balanced, thoughtful decisions.  We learned how to recognize when we're in Emotional Mind or Reasonable Mind, and practiced using Wise Mind in  real-life situations. By using Delsie Smiles, we can improve how we handle challenging situations, make better decisions, and strengthen our relationships with others.   Fancy Dunkley O Clois Montavon, LCSWA 12/20/2023  4:11 PM

## 2023-12-20 NOTE — Group Note (Unsigned)
 Date:  12/20/2023 Time:  2:20 PM  Group Topic/Focus:  Goals Group:   The focus of this group is to help patients establish daily goals to achieve during treatment and discuss how the patient can incorporate goal setting into their daily lives to aide in recovery. Orientation:   The focus of this group is to educate the patient on the purpose and policies of crisis stabilization and provide a format to answer questions about their admission.  The group details unit policies and expectations of patients while admitted.     Participation Level:  {BHH PARTICIPATION OZCZO:77735}  Participation Quality:  {BHH PARTICIPATION QUALITY:22265}  Affect:  {BHH AFFECT:22266}  Cognitive:  {BHH COGNITIVE:22267}  Insight: {BHH Insight2:20797}  Engagement in Group:  {BHH ENGAGEMENT IN HMNLE:77731}  Modes of Intervention:  {BHH MODES OF INTERVENTION:22269}  Additional Comments:  ***  Jimmy Fisher L Dontavis Tschantz 12/20/2023, 2:20 PM

## 2023-12-20 NOTE — Group Note (Signed)
 Date:  12/20/2023 Time:  2:17 PM  Group Topic/Focus:  Goals Group:   The focus of this group is to help patients establish daily goals to achieve during treatment and discuss how the patient can incorporate goal setting into their daily lives to aide in recovery. Orientation:   The focus of this group is to educate the patient on the purpose and policies of crisis stabilization and provide a format to answer questions about their admission.  The group details unit policies and expectations of patients while admitted.    Participation Level:  Did Not Attend   Jimmy Fisher 12/20/2023, 2:17 PM

## 2023-12-20 NOTE — Group Note (Signed)
 Date:  12/20/2023 Time:  8:34 PM  Group Topic/Focus:  Wrap-Up Group:   The focus of this group is to help patients review their daily goal of treatment and discuss progress on daily workbooks.    Participation Level:  Did Not Attend  Lanea Vankirk Dacosta 12/20/2023, 8:34 PM

## 2023-12-20 NOTE — Progress Notes (Signed)
 [facility] MD Progress Note  12/20/2023 1:13 PM FALON FLINCHUM  MRN:  [id number]  Principal Problem: MDD (major depressive disorder), recurrent episode, severe (HCC) Diagnosis: Principal Problem:   MDD (major depressive disorder), recurrent episode, severe (HCC) Active Problems:   Opioid dependence with opioid-induced mood disorder (HCC)   HIV disease (HCC)   Alcohol use disorder   Sedative, hypnotic or anxiolytic use disorder, moderate, in controlled environment   Stimulant use disorder   Substance-induced psychotic disorder (HCC)   Reason for Admission:  DAYTON KENLEY is a 43 yr old male who presented on 7/3 to [facility] requesting Detox after a multi-day binge, he was admitted to [facility] on 7/4.  PPHx is significant for MDD, GAD, Polysubstance Abuse (EtOH, Meth), Suicide Attempts (11/2023), a remote history of Self Injurious Behavior, and Multiple Psychiatric Hospitalizations.  (admitted on 12/16/2023, total  LOS: 4 days )   ASSESSMENT: Patient doing relatively well w/ minimal withdrawal symptoms. He is sufficiently stable to go to [facility] once bed is available. No episodes of irritability noted.    PLAN: MDD, Recurrent, Severe, w/out Psychosis  GAD: -Continue Prozac  40 mg daily for depression and anxiety -Continue Zyprexa  5 mg QHS for augmentation and mood stability -Continue Agitation Protocol: Haldol /Ativan /Benadryl      Withdrawal: -Continue CIWA, last score= 5  @ 7/7 -Continue Ativan  1 mg q6 PRN CIWA>10 -Continue Bentyl  20 mg q6 PRN spasms -Continue Imodium  2-4 mg PRN diarrhea -Continue Zofran -ODT 4 mg q6 PRN nausea -Continue Thiamine  100 mg daily for nutritional supplementation -Continue Multivitamin daily for nutritional supplementation -Continue Folic Acid  1 mg daily for nutritional supplementation    HIV: -Continue Biktarvy  50-200-25 mg daily     Nicotine  Dependence: -Continue Nicotine  Patch 21 mg daily     -Continue Neosporin BID for thigh rash -Continue Lipitor  10 mg daily -Continue Losartan  25 mg daily -Continue Protonix  EC 40 mg daily -Continue Carafate  1 g TID -Continue PRN's: Tylenol , Maalox, Atarax , Milk of Magnesia   Disposition Planning: -- Estimated LOS: 5 days --Estimated Discharge Date: 7/10 --Barriers to Discharge: residential rehab placement, mood stability -- Discharge Goals: Return home with outpatient referrals for mental health follow-up including medication management/psychotherapy  Subjective: The patient was seen and evaluated on the unit. On assessment today.  He reports to be eating and sleeping well.  Denies SI/HI/AVH.  Reports minimal withdrawal symptoms but does continue to have mild headache and myalgia. Otherwise, no other acute concerns. Patient has he feels ready to go to [facility] whenever there is a bed available.  Objective: Chart Review from last 24 hours:  The patient's chart was reviewed and nursing notes were reviewed. The patient's case was discussed in multidisciplinary team meeting.  - Overnight events to report per chart review / staff report: RN reports patient made threatening remarks towards roommate and was moved - Patient took all prescribed medications yes - Patient received the following PRNs: hydroxyzine  and tylenol   Current Medications: Current Facility-Administered Medications  Medication Dose Route Frequency Provider Last Rate Last Admin   acetaminophen  (TYLENOL ) tablet 650 mg  650 mg Oral Q6H PRN Brent, Amanda C, NP   650 mg at 12/19/23 2047   alum & mag hydroxide-simeth (MAALOX/MYLANTA) 200-200-20 MG/5ML suspension 30 mL  30 mL Oral Q4H PRN Brent, Amanda C, NP       atorvastatin  (LIPITOR) tablet 10 mg  10 mg Oral Daily Brent, Amanda C, NP   10 mg at 12/20/23 0924   bictegravir-emtricitabine -tenofovir  AF (BIKTARVY ) 50-200-25 MG per tablet 1  tablet  1 tablet Oral Daily Brent, Amanda C, NP   1 tablet at 12/20/23 9364   haloperidol  (HALDOL ) tablet 5 mg  5 mg Oral TID PRN Brent, Amanda C, NP        And   diphenhydrAMINE  (BENADRYL ) capsule 50 mg  50 mg Oral TID PRN Brent, Amanda C, NP       haloperidol  lactate (HALDOL ) injection 5 mg  5 mg Intramuscular TID PRN Brent, Amanda C, NP       And   diphenhydrAMINE  (BENADRYL ) injection 50 mg  50 mg Intramuscular TID PRN Brent, Amanda C, NP       And   LORazepam  (ATIVAN ) injection 2 mg  2 mg Intramuscular TID PRN Brent, Amanda C, NP       haloperidol  lactate (HALDOL ) injection 10 mg  10 mg Intramuscular TID PRN Thresa Alan BROCKS, NP       And   diphenhydrAMINE  (BENADRYL ) injection 50 mg  50 mg Intramuscular TID PRN Brent, Amanda C, NP       And   LORazepam  (ATIVAN ) injection 2 mg  2 mg Intramuscular TID PRN Brent, Amanda C, NP       FLUoxetine  (PROZAC ) capsule 40 mg  40 mg Oral Daily Brent, Amanda C, NP   40 mg at 12/20/23 9076   folic acid  (FOLVITE ) tablet 1 mg  1 mg Oral Daily Brent, Amanda C, NP   1 mg at 12/20/23 9076   hydrOXYzine  (ATARAX ) tablet 25 mg  25 mg Oral TID PRN Pashayan, Alexander S, DO   25 mg at 12/19/23 2048   loperamide  (IMODIUM ) capsule 2-4 mg  2-4 mg Oral PRN Pashayan, Alexander S, DO       LORazepam  (ATIVAN ) tablet 1 mg  1 mg Oral Q6H PRN Pashayan, Alexander S, DO       losartan  (COZAAR ) tablet 25 mg  25 mg Oral Daily Brent, Amanda C, NP   25 mg at 12/20/23 9075   multivitamin with minerals tablet 1 tablet  1 tablet Oral Daily Brent, Amanda C, NP   1 tablet at 12/20/23 9076   neomycin -bacitracin -polymyxin [phone] OINT   Apply externally BID Pashayan, Alexander S, DO   Given at 12/20/23 9075   nicotine  polacrilex (NICORETTE ) gum 2 mg  2 mg Oral PRN Bobbitt, Shalon E, NP   2 mg at 12/20/23 9073   OLANZapine  (ZYPREXA ) tablet 5 mg  5 mg Oral QHS Nare Gaspari, MD   5 mg at 12/19/23 2047   ondansetron  (ZOFRAN -ODT) disintegrating tablet 4 mg  4 mg Oral Q6H PRN Pashayan, Alexander S, DO       pantoprazole  (PROTONIX ) EC tablet 40 mg  40 mg Oral BID AC Brent, Amanda C, NP   40 mg at 12/20/23 0636   sucralfate  (CARAFATE ) 1 GM/10ML  suspension 1 g  1 g Oral TID WC Brent, Amanda C, NP   1 g at 12/20/23 0636   [START ON 12/21/2023] sucralfate  (CARAFATE ) tablet 1 g  1 g Oral TID WC Green, Terri L, RPH       thiamine  (Vitamin B-1) tablet 100 mg  100 mg Oral Daily Brent, Amanda C, NP   100 mg at 12/20/23 9075    Lab Results: No results found for this or any previous visit (from the past 48 hours).  Blood Alcohol level:  Lab Results  Component Value Date   ETH 81 (H) 12/15/2023   ETH <15 11/17/2023    Metabolic Labs: Lab Results  Component  Value Date   HGBA1C 5.3 12/15/2023   MPG 105.41 12/15/2023   No results found for: PROLACTIN Lab Results  Component Value Date   CHOL 155 12/15/2023   TRIG 153 (H) 12/15/2023   HDL 38 (L) 12/15/2023   CHOLHDL 4.1 12/15/2023   VLDL 31 12/15/2023   LDLCALC 86 12/15/2023   LDLCALC 88 09/07/2023    Physical Findings: AIMS: No  CIWA:  CIWA-Ar Total: 1 COWS:     Psychiatric Specialty Exam: General Appearance: Appropriate for Environment; Casual   Eye Contact: Good   Speech: Clear and Coherent; Normal Rate   Volume: Normal   Mood: Euthymic   Affect: Appropriate; Congruent   Thought Content: Logical   Suicidal Thoughts: Suicidal Thoughts: No    Homicidal Thoughts: Homicidal Thoughts: No    Thought Process: Coherent; Goal Directed; Linear   Orientation: Full (Time, Place and Person)     Memory: Immediate Good; Recent Good; Remote Good   Judgment: Fair   Insight: Fair   Concentration: Good   Recall: Good   Fund of Knowledge: Good   Language: Good   Psychomotor Activity: Psychomotor Activity: Normal    Assets: Communication Skills; Desire for Improvement; Physical Health   Sleep: Sleep: Good     Review of Systems ROS  Vital Signs: Blood pressure (!) 130/92, pulse 62, temperature 98.4 F (36.9 C), temperature source Oral, resp. rate 18, height 5' 6 (1.676 m), weight 84.5 kg, SpO2 97%. Body mass index is 30.05 kg/m. Physical Exam  I  certify that inpatient services furnished can reasonably be expected to improve the patient's condition.   Signed: Prentice Espy, MD 12/20/2023, 1:13 PM

## 2023-12-20 NOTE — Group Note (Signed)
 Recreation Therapy Group Note   Group Topic:Health and Wellness  Group Date: 12/20/2023 Start Time: 1040 End Time: 1105 Facilitators: Zuhair Lariccia-McCall, LRT,CTRS Location: [address] Dayroom   Group Topic: Exercise/Wellness  Goal Area(s) Addresses:  Patient will verbalize benefit of exercise during group session. Patient will verbalize an exercise that can be completed in their hospital room during admission. Patient will identify an exercise that can be completed post d/c.  Behavioral Response:   Intervention: Music  Activity: LRT and patients discussed the importance of exercise and how it can make you feel. LRT explained to patients they were each going to lead the group in the exercises of their choosing. Patients were also encouraged to sit down if needed and get water if needed. Patients were to give as much participation as possible without over exaggerating themselves.  Education: Physical Activity, Health and Wellness  Education Outcome: Acknowledges understanding/In group clarification offered/Needs additional education.    Affect/Mood: N/A   Participation Level: Did not attend    Clinical Observations/Individualized Feedback:     Plan: Continue to engage patient in RT group sessions 2-3x/week.   Breda Bond-McCall, LRT,CTRS 12/20/2023 1:23 PM

## 2023-12-20 NOTE — Progress Notes (Signed)
   12/20/23 0000  Psych Admission Type (Psych Patients Only)  Admission Status Voluntary  Psychosocial Assessment  Patient Complaints Anxiety;Depression  Eye Contact Brief  Facial Expression Animated  Affect Appropriate to circumstance  Speech Logical/coherent  Interaction Assertive  Motor Activity Slow  Appearance/Hygiene Unremarkable  Behavior Characteristics Guarded  Mood Depressed  Thought Process  Coherency WDL  Content WDL  Delusions None reported or observed  Perception WDL  Hallucination None reported or observed  Judgment Poor  Confusion None  Danger to Self  Current suicidal ideation? Denies  Agreement Not to Harm Self Yes  Description of Agreement verbal  Danger to Others  Danger to Others None reported or observed

## 2023-12-21 ENCOUNTER — Other Ambulatory Visit: Payer: Self-pay

## 2023-12-21 ENCOUNTER — Other Ambulatory Visit (HOSPITAL_COMMUNITY): Payer: Self-pay

## 2023-12-21 DIAGNOSIS — F332 Major depressive disorder, recurrent severe without psychotic features: Secondary | ICD-10-CM | POA: Diagnosis not present

## 2023-12-21 MED ORDER — OLANZAPINE 5 MG PO TABS
5.0000 mg | ORAL_TABLET | Freq: Every day | ORAL | 0 refills | Status: DC
  Filled 2023-12-21 (×2): qty 30, 30d supply, fill #0

## 2023-12-21 MED ORDER — FLUOXETINE HCL 40 MG PO CAPS
40.0000 mg | ORAL_CAPSULE | Freq: Every day | ORAL | 0 refills | Status: DC
  Filled 2023-12-21: qty 30, 30d supply, fill #0

## 2023-12-21 MED ORDER — LOSARTAN POTASSIUM 25 MG PO TABS
25.0000 mg | ORAL_TABLET | Freq: Every day | ORAL | 0 refills | Status: DC
  Filled 2023-12-21: qty 30, 30d supply, fill #0

## 2023-12-21 MED ORDER — OMEPRAZOLE 20 MG PO CPDR
20.0000 mg | DELAYED_RELEASE_CAPSULE | Freq: Two times a day (BID) | ORAL | 0 refills | Status: DC
  Filled 2023-12-21 (×2): qty 60, 30d supply, fill #0

## 2023-12-21 MED ORDER — OLANZAPINE 5 MG PO TABS
5.0000 mg | ORAL_TABLET | Freq: Every day | ORAL | 0 refills | Status: DC
  Filled 2023-12-21: qty 30, 30d supply, fill #0

## 2023-12-21 MED ORDER — SUCRALFATE 1 G PO TABS
1.0000 g | ORAL_TABLET | Freq: Three times a day (TID) | ORAL | 0 refills | Status: DC
  Filled 2023-12-21: qty 90, 30d supply, fill #0

## 2023-12-21 MED ORDER — SUCRALFATE 1 GM/10ML PO SUSP
1.0000 g | Freq: Three times a day (TID) | ORAL | 0 refills | Status: DC
  Filled 2023-12-21 – 2023-12-22 (×2): qty 900, 1d supply, fill #0

## 2023-12-21 MED ORDER — ATORVASTATIN CALCIUM 10 MG PO TABS
10.0000 mg | ORAL_TABLET | Freq: Every day | ORAL | 0 refills | Status: DC
  Filled 2023-12-21: qty 30, 30d supply, fill #0

## 2023-12-21 MED ORDER — BICTEGRAVIR-EMTRICITAB-TENOFOV 50-200-25 MG PO TABS
1.0000 | ORAL_TABLET | Freq: Every day | ORAL | 0 refills | Status: DC
  Filled 2023-12-21: qty 30, 30d supply, fill #0

## 2023-12-21 NOTE — Progress Notes (Addendum)
 Conversation with patient:  CSW offered a list of [facility] and [facility] facilities but patient declined at this time.    Anis Cinelli, LCSWA 12/21/2023

## 2023-12-21 NOTE — Progress Notes (Signed)
   12/21/23 1344  Psych Admission Type (Psych Patients Only)  Admission Status Voluntary  Psychosocial Assessment  Patient Complaints Substance abuse  Eye Contact Fair  Facial Expression Anxious  Affect Appropriate to circumstance  Speech Logical/coherent  Interaction Assertive  Motor Activity  (WNL)  Appearance/Hygiene Unremarkable  Behavior Characteristics Resistant to care  Mood Depressed  Thought Process  Coherency WDL  Content WDL  Delusions None reported or observed  Perception WDL  Hallucination None reported or observed  Judgment Impaired  Confusion None  Danger to Self  Current suicidal ideation? Denies  Agreement Not to Harm Self Yes  Description of Agreement verbal  Danger to Others  Danger to Others None reported or observed

## 2023-12-21 NOTE — Progress Notes (Addendum)
  [facility] Adult Case Management Discharge Plan :  Will you be returning to the same living situation after discharge:  No, patient will go to his mom's [place]) home in [place], [place] At discharge, do you have transportation home?: Yes,  Turki Tapanes (mom) [phone] will pick him up at 4 PM Do you have the ability to pay for your medications: Yes,  patient has insurance  Release of information consent forms completed and in the chart;  Patient's signature needed at discharge.  Patient to Follow up at:  Follow-up Information     [facility] Follow up.   Why: Referral made Contact information: [address] [place] [address] [phone]         [facility] Follow up on 12/23/2023.   Why: Please call this provider on 12/23/23 at 9:00 am to schedule a hospital follow up appointment for Virtual therapy and medication management services. Contact information: [address]  [address] [place] [address] [phone]         [facility] Follow up.   Why: You may call this provider [facility]) if you wish to participate in Virtual substance use intensive outpatient therapy services. Contact information: [address] [place] [address] [phone]         [facility] Follow up.   Specialty: Addiction Medicine Why: Referral made Contact information: [address] [place] [address] [phone]                 Next level of care provider has access to [facility] Link:no  Safety Planning and Suicide Prevention discussed:  Deiondre Harrower (mom) [phone]   Has patient been referred to the Quitline?: Patient refused referral for treatment.  Patient doesn't want to stop smoking at this time.  Patient has been referred for addiction treatment: Patient was conditionally accepted to [facility] (he needs to attend his doctor's appointment on 7/21, and then  can participate in their program).  A referral was also submitted to [facility].  Patient will follow-up with these agencies.   Alysandra Lobue O Kiosha Buchan, LCSWA 12/21/2023, 3:12 PM

## 2023-12-21 NOTE — Progress Notes (Signed)
 Pt remains in room this morning.  He has been asked twice to come to med window for morning medications.  Pt states he will comply but continues to remain in bed. Discharge orders received and SW is currently working on Follow up.

## 2023-12-21 NOTE — Discharge Summary (Signed)
 Physician Discharge Summary Note Patient:  Jimmy Fisher is an 43 y.o., male MRN:  [id number] DOB:  [birth year] Patient phone:  [phone] (home)  Patient address:   [address] [place] [address],  Total Time spent with patient: 1 hour  Date of Admission:  12/16/2023 Date of Discharge: 12/21/23  Reason for Admission: Jimmy Fisher is a 43 yr old male who presented on 7/3 to [facility] requesting Detox after a multi-day binge, he was admitted to [facility] on 7/4.  PPHx is significant for MDD, GAD, Polysubstance Abuse (EtOH, Meth), Suicide Attempts (11/2023), a remote history of Self Injurious Behavior, and Multiple Psychiatric Hospitalizations.    Hospital Course  During the patient's hospitalization, patient had extensive initial psychiatric evaluation, and follow-up psychiatric evaluations every day.  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  The patient denies having side effects to prescribed psychiatric medication.  Gradually, patient started adjusting to milieu. The patient was evaluated each day by a clinical provider to ascertain response to treatment. Improvement was noted by the patient's report of decreasing symptoms, improved sleep and appetite, affect, medication tolerance, behavior, and participation in unit programming.  Patient was asked each day to complete a  self inventory noting mood, mental status, pain, new symptoms, anxiety and concerns.    Symptoms were reported as significantly decreased or resolved completely by discharge.   On day of discharge, the patient reports that their mood is stable. The patient denied having suicidal thoughts for more than 48 hours prior to discharge.  Patient denies having homicidal thoughts.  Patient denies having auditory hallucinations.  Patient denies any visual hallucinations or other symptoms of psychosis. The patient was motivated to continue taking medication with a goal of continued improvement in mental health.   The patient reports their target psychiatric symptoms of depression and irritability responded well to the psychiatric medications, and the patient reports overall benefit other psychiatric hospitalization. Supportive psychotherapy was provided to the patient. The patient also participated in regular group therapy while hospitalized. Coping skills, problem solving as well as relaxation therapies were also part of the unit programming.  Labs were reviewed with the patient, and abnormal results were discussed with the patient.  The patient is able to verbalize their individual safety plan to this provider.  # It is recommended to the patient to continue psychiatric medications as prescribed, after discharge from the hospital.    # It is recommended to the patient to follow up with your outpatient psychiatric provider and PCP.  # It was discussed with the patient, the impact of alcohol, drugs, tobacco have been there overall psychiatric and medical wellbeing, and total abstinence from substance use was recommended the patient.ed.  # Prescriptions provided or sent directly to preferred pharmacy at discharge. Patient agreeable to plan. Given opportunity to ask questions. Appears to feel comfortable with discharge.    # In the event of worsening symptoms, the patient is instructed to call the crisis  hotline, 911 and or go to the nearest  ED for appropriate evaluation and treatment of symptoms. To follow-up with primary care provider for other medical issues, concerns and or health care needs  # Patient was discharged home with plan to go to [facility] tomorrow AM 7/10.    Principal Problem: MDD (major depressive disorder), recurrent episode, severe (HCC) Discharge Diagnoses: Principal Problem:   MDD (major depressive disorder), recurrent episode, severe (HCC) Active Problems:   Opioid dependence with opioid-induced mood disorder (HCC)   HIV disease (HCC)   Alcohol use disorder   Sedative, hypnotic or anxiolytic use disorder, moderate, in controlled environment   Stimulant use disorder   Substance-induced psychotic disorder [facility])    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   Neuromuscular disorder (HCC)    fibromyalgia   Pulmonary embolism (HCC)    2012   SLAP tear of shoulder    left   Urticarial rash 12/05/2022   Vaccine counseling 05/09/2023    Past Surgical History:  Procedure Laterality Date   COLONOSCOPY     I & D EXTREMITY Left 07/16/2020   Procedure: IRRIGATION AND DEBRIDEMENT FOREARM;  Surgeon: Shari Easter, MD;  Location: [facility] OR;  Service: Orthopedics;  Laterality: Left;   IRRIGATION AND DEBRIDEMENT ELBOW Right 11/21/2023   Procedure: IRRIGATION AND DEBRIDEMENT ELBOW;  Surgeon: Beuford Anes, MD;  Location: [facility];  Service: Orthopedics;  Laterality: Right;   SHOULDER ARTHROSCOPY WITH BICEPS TENDON REPAIR Left 10/22/2020   Procedure: LEFT SHOULDER ARTHROSCOPY, BICEPS TENODESIS;  Surgeon: Jerri Kay HERO, MD;  Location: [facility];  Service: Orthopedics;  Laterality: Left;   SUBACROMIAL DECOMPRESSION Left 10/22/2020   Procedure: SUBACROMIAL DECOMPRESSION;  Surgeon: Jerri Kay HERO, MD;  Location: [facility];  Service: Orthopedics;  Laterality: Left;   UPPER GI ENDOSCOPY      Family History:  Family History  Problem Relation Age of Onset   Fibromyalgia Mother    Cancer Father     Social History:  Social History   Socioeconomic History   Marital status: Married    Spouse name: Not on file   Number of children: 2   Years of education: Not on file   Highest education level: Not on file  Occupational History   Not on file  Tobacco Use   Smoking status: Every Day    Types: E-cigarettes    Passive exposure: Past   Smokeless tobacco: Former    Types: Snuff  Vaping Use   Vaping status: Every Day   Substances: Nicotine   Substance and Sexual Activity   Alcohol use: Not Currently    Alcohol/week: 30.0 standard drinks of alcohol    Types: 30 Standard drinks or equivalent per week    Comment: 1/5 a day reported by patient   Drug use: Yes    Types: Cocaine, Methamphetamines, Marijuana   Sexual activity: Yes    Comment: declined condoms 09/07/23  Other Topics Concern   Not on file  Social History Narrative   Right Handed    Has two sons    Lives in a one story home    Social Drivers of Health   Financial  Resource Strain: Not on file  Food Insecurity: Food Insecurity Present (12/16/2023)   Hunger Vital Sign    Worried About Running Out of Food in the Last Year: Sometimes true    Ran Out of Food in the Last Year: Sometimes true  Transportation Needs: No Transportation Needs (12/16/2023)   PRAPARE - Administrator, Civil Service (Medical): No    Lack of Transportation (Non-Medical): No  Physical Activity: Not on file  Stress: Not on file  Social Connections: Unknown (11/18/2023)   Social Connection and Isolation Panel    Frequency of Communication with Friends and  Family: Not on file    Frequency of Social Gatherings with Friends and Family: Not on file    Attends Religious Services: Not on file    Active Member of Clubs or Organizations: Not on file    Attends Banker Meetings: Not on file    Marital Status: Divorced  Intimate Partner Violence: Not At Risk (12/16/2023)   Humiliation, Afraid, Rape, and Kick questionnaire    Fear of Current or Ex-Partner: No    Emotionally Abused: No    Physically Abused: No    Sexually Abused: No     Physical Findings: AIMS:  , ,  ,  ,    CIWA:  CIWA-Ar Total: 0 COWS:     Musculoskeletal: Strength & Muscle Tone: within normal limits Gait & Station: normal Patient leans: N/A  Psychiatric Specialty Exam  Presentation  General Appearance: Appropriate for Environment; Casual  Eye Contact:Good  Speech:Clear and Coherent; Normal Rate  Speech Volume:Normal   Mood and Affect  Mood:Euthymic  Affect:Appropriate; Congruent   Thought Process  Thought Processes:Coherent; Goal Directed; Linear  Descriptions of Associations:Intact  Orientation:Full (Time, Place and Person)  Thought Content:Logical  Hallucinations:Hallucinations: None  Ideas of Reference:None  Suicidal Thoughts:Suicidal Thoughts: No  Homicidal Thoughts:Homicidal Thoughts: No   Sensorium  Memory:Immediate Good; Recent Good; Remote Good  Judgment:Fair  Insight:Fair   Executive Functions  Concentration:Good  Attention Span:Good  Recall:Good  Fund of Knowledge:Good  Language:Good   Psychomotor Activity  Psychomotor Activity:Psychomotor Activity: Normal   Assets  Assets:Communication Skills; Desire for Improvement; Physical Health   Sleep  Sleep:Sleep: Good   Physical Exam: Physical Exam ROS Blood pressure (!) 134/99, pulse 85, temperature 98.6 F (37 C), temperature source Oral, resp. rate 18, height 5' 6 (1.676 m), weight 84.5 kg, SpO2 98%. Body mass index is 30.05 kg/m.  Social  History   Tobacco Use  Smoking Status Every Day   Types: E-cigarettes   Passive exposure: Past  Smokeless Tobacco Former   Types: Snuff   Tobacco Cessation:  N/A, patient does not currently use tobacco products  Blood Alcohol level:  Lab Results  Component Value Date   ETH 81 (H) 12/15/2023   ETH <15 11/17/2023    Metabolic Disorder Labs:  Lab Results  Component Value Date   HGBA1C 5.3 12/15/2023   MPG 105.41 12/15/2023   No results found for: PROLACTIN Lab Results  Component Value Date   CHOL 155 12/15/2023   TRIG 153 (H) 12/15/2023   HDL 38 (L) 12/15/2023   CHOLHDL 4.1 12/15/2023   VLDL 31 12/15/2023   LDLCALC 86 12/15/2023   LDLCALC 88 09/07/2023    See Psychiatric Specialty Exam and Suicide Risk Assessment completed by Attending Physician prior to discharge.  Discharge destination:  Home with mom with plan to go to [facility] tomorrow  Is patient on multiple antipsychotic therapies at discharge:  No  Has Patient had three or more failed trials of antipsychotic monotherapy by history:  No  Recommended Plan for Multiple Antipsychotic Therapies: NA      Follow-up Information     [facility] Follow up.   Why: Referral made Contact information: [address] [place] [address] [phone]         [facility] Follow up on 12/23/2023.   Why: Please call this provider on 12/23/23 at 9:00 am to schedule a hospital follow up appointment for Virtual therapy and medication management services. Contact information: [address]  [address] [place] [address] [phone]         [facility] Follow up.   Why: You may call this provider [facility]) if you wish to participate in Virtual substance use intensive outpatient therapy services. Contact information: [address] [place] [address] [phone]         [facility] Follow up.   Specialty: Addiction Medicine Why:  Referral made Contact information: [address] [place] [address] [phone]                  Follow-up recommendations:   Activity:  as tolerated Diet:  heart healthy   Comments:  Prescriptions were given at discharge.  Patient is agreeable with the discharge plan.  Patient was given an opportunity to ask questions.  Patient appears to feel comfortable with discharge and denies any current suicidal or homicidal thoughts.    Patient is instructed prior to discharge to: Take all medications as prescribed by mental healthcare provider. Report any adverse effects and or reactions from the medicines to outpatient provider promptly. In the event of worsening symptoms, patient is instructed to call the crisis hotline, 911 and or go to the nearest ED for appropriate evaluation and treatment of symptoms. Patient is to follow-up with primary care provider for other medical issues, concerns and or health care needs.     Signed: Prentice Espy, MD 12/21/2023, 2:51 PM

## 2023-12-21 NOTE — [facility] Suicide Risk Assessment (Deleted)
 [facility] Discharge Suicide Risk Assessment   Principal Problem: MDD (major depressive disorder), recurrent episode, severe (HCC) Discharge Diagnoses: Principal Problem:   MDD (major depressive disorder), recurrent episode, severe (HCC) Active Problems:   Opioid dependence with opioid-induced mood disorder (HCC)   HIV disease (HCC)   Alcohol use disorder   Sedative, hypnotic or anxiolytic use disorder, moderate, in controlled environment   Stimulant use disorder   Substance-induced psychotic disorder (HCC)   Reason for Admission: Jimmy Fisher is a 43 yr old male who presented on 7/3 to [facility] requesting Detox after a multi-day binge, he was admitted to [facility] on 7/4.  PPHx is significant for MDD, GAD, Polysubstance Abuse (EtOH, Meth), Suicide Attempts (11/2023), a remote history of Self Injurious Behavior, and Multiple Psychiatric Hospitalizations.    Hospital Course  During the patient's hospitalization, patient had extensive initial psychiatric evaluation, and follow-up psychiatric evaluations every day.  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  The patient denies having side effects to prescribed psychiatric medication.  Gradually, patient started adjusting to milieu. The patient was evaluated each day by a clinical provider to ascertain response to treatment. Improvement was  noted by the patient's report of decreasing symptoms, improved sleep and appetite, affect, medication tolerance, behavior, and participation in unit programming.  Patient was asked each day to complete a self inventory noting mood, mental status, pain, new symptoms, anxiety and concerns.    Symptoms were reported as significantly decreased or resolved completely by discharge.   On day of discharge, the patient reports that their mood is stable. The patient denied having suicidal thoughts for more than 48 hours prior to discharge.  Patient denies having homicidal thoughts.  Patient denies having auditory hallucinations.  Patient denies any visual hallucinations or other symptoms of psychosis. The patient was motivated to continue taking medication with a goal of continued improvement in mental health.   The patient reports their target psychiatric symptoms of depression and irritability responded well to the psychiatric medications, and the patient reports overall benefit other psychiatric hospitalization. Supportive psychotherapy was provided to the patient. The patient also participated in regular group therapy while hospitalized. Coping skills, problem solving as well as relaxation therapies were also part of the unit programming.  Labs were reviewed with the patient, and abnormal results were discussed with the patient.  The patient is able to verbalize their individual safety plan to this provider.  # It is recommended to the patient to continue psychiatric medications as prescribed, after discharge from the hospital.    # It is recommended to the patient to follow up with your outpatient psychiatric provider and PCP.  # It was discussed with the patient, the impact of alcohol, drugs, tobacco have been there overall psychiatric and medical wellbeing, and total abstinence from substance use was recommended the patient.ed.  # Prescriptions provided or sent directly to preferred pharmacy at  discharge. Patient agreeable to plan. Given opportunity to ask questions. Appears to feel comfortable with discharge.    #  In the event of worsening symptoms, the patient is instructed to call the crisis hotline, 911 and or go to the nearest ED for appropriate evaluation and treatment of symptoms. To follow-up with primary care provider for other medical issues, concerns and or health care needs  # Patient was discharged home with plan to go to [facility] tomorrow AM 7/10.   Total Time spent with patient: 1 hour  Musculoskeletal: Strength & Muscle Tone: within normal limits Gait & Station: normal Patient leans: N/A  Psychiatric Specialty Exam  Presentation  General Appearance: Appropriate for Environment; Casual   Eye Contact:Good   Speech:Clear and Coherent; Normal Rate   Speech Volume:Normal   Handedness:Right    Mood and Affect  Mood:Euthymic   Duration of Depression Symptoms: Greater than two weeks   Affect:Appropriate; Congruent    Thought Process  Thought Processes:Coherent; Goal Directed; Linear   Descriptions of Associations:Intact   Orientation:Full (Time, Place and Person)   Thought Content:Logical   History of Schizophrenia/Schizoaffective disorder:No   Duration of Psychotic Symptoms:N/A   Hallucinations:Hallucinations: None  Ideas of Reference:None   Suicidal Thoughts:Suicidal Thoughts: No  Homicidal Thoughts:Homicidal Thoughts: No   Sensorium  Memory:Immediate Good; Recent Good; Remote Good   Judgment:Fair   Insight:Fair    Executive Functions  Concentration:Good   Attention Span:Good   Recall:Good   Fund of Knowledge:Good   Language:Good    Psychomotor Activity  Psychomotor Activity:Psychomotor Activity: Normal   Assets  Assets:Communication Skills; Desire for Improvement; Physical Health    Sleep  Sleep:Sleep: Good   Physical Exam: Physical Exam ROS Blood pressure (!) 134/99, pulse 85,  temperature 98.6 F (37 C), temperature source Oral, resp. rate 18, height 5' 6 (1.676 m), weight 84.5 kg, SpO2 98%. Body mass index is 30.05 kg/m.  Mental Status Per Nursing Assessment::   On Admission:  Self-harm thoughts  Demographic Factors:  Male and Low socioeconomic status  Loss Factors: NA  Historical Factors: Impulsivity  Risk Reduction Factors:   Living with another person, especially a relative, Positive social support, and Positive coping skills or problem solving skills  Continued Clinical Symptoms:  Alcohol/Substance Abuse/Dependencies More than one psychiatric diagnosis Previous Psychiatric Diagnoses and Treatments  Cognitive Features That Contribute To Risk:  None    Suicide Risk:  Minimal: No identifiable suicidal ideation.  Patients presenting with no risk factors but with morbid ruminations; may be classified as minimal risk based on the severity of the depressive symptoms   Follow-up Information     [facility] Follow up.   Why: Referral made Contact information: [address] [place] [address] [phone]         [facility]. Schedule an appointment as soon as possible for a visit.   Why: Please call this provider to schedule a hospital follow up appointment for Virtual therapy and medication management services. Contact information: [address]  [address] [place] [address] [phone]                 Plan Of Care/Follow-up recommendations:  Activity: as tolerated  Diet: heart healthy  Other: -Follow-up with your outpatient psychiatric provider -instructions on appointment date, time, and address (location) are provided to you in discharge paperwork.  -Take your psychiatric medications as prescribed at discharge - instructions are provided to you in the discharge paperwork  -Follow-up with outpatient primary care doctor and other specialists -for management of chronic medical disease, including:  HIV  -Testing: Follow-up with outpatient provider for abnormal lab results:  none  -Recommend abstinence from alcohol, tobacco, and other illicit drug use at discharge.   -If your psychiatric symptoms recur, worsen, or if you have side effects to your psychiatric medications, call your outpatient psychiatric provider, 911, [phone] or go to the nearest emergency department.  -If suicidal thoughts recur, call your outpatient psychiatric provider, 911, 988 or go to the nearest emergency department.   Prentice Espy, MD 12/21/2023, 8:36 AM

## 2023-12-21 NOTE — Progress Notes (Signed)
 Per Dr. Lynnette pt's discharge has been postponed.   Pt and family are aware.   Pt remains in room in no distress. Cont to refuse to get up for medication. Will cont to monitor

## 2023-12-21 NOTE — Progress Notes (Signed)
 Patient was given return to work note.   Anikka Marsan, LCSWA 12/21/2023

## 2023-12-21 NOTE — Plan of Care (Signed)
   Problem: Education: Goal: Knowledge of [facility] Education information/materials will improve Outcome: Progressing Goal: Emotional status will improve Outcome: Progressing Goal: Mental status will improve Outcome: Progressing Goal: Verbalization of understanding the information provided will improve Outcome: Progressing

## 2023-12-21 NOTE — Progress Notes (Signed)
 Pt was informed that he will be discharged today per his request.   Pt Cont to deny SI, HI and AVH. Prescriptions and med samples placed in envelope with AVS and Discharge SRA.

## 2023-12-21 NOTE — Discharge Summary (Deleted)
 Physician Discharge Summary Note Patient:  Jimmy Fisher is an 43 y.o., male MRN:  [id number] DOB:  05/20/1981 Patient phone:  [phone] (home)  Patient address:   [address] [place] [address],  Total Time spent with patient: 1 hour  Date of Admission:  12/16/2023 Date of Discharge: 12/21/23  Reason for Admission: Jimmy Fisher is a 43 yr old male who presented on 7/3 to [facility] requesting Detox after a multi-day binge, he was admitted to [facility] on 7/4.  PPHx is significant for MDD, GAD, Polysubstance Abuse (EtOH, Meth), Suicide Attempts (11/2023), a remote history of Self Injurious Behavior, and Multiple Psychiatric Hospitalizations.    Hospital Course  During the patient's hospitalization, patient had extensive initial psychiatric evaluation, and follow-up psychiatric evaluations every day.  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  The patient denies having side effects to prescribed psychiatric medication.  Gradually, patient started adjusting to milieu. The patient was evaluated each day by a clinical provider to ascertain response to treatment. Improvement was noted by the patient's report of decreasing symptoms, improved sleep and appetite, affect, medication tolerance, behavior, and participation in unit programming.  Patient was asked each day to complete a  self inventory noting mood, mental status, pain, new symptoms, anxiety and concerns.    Symptoms were reported as significantly decreased or resolved completely by discharge.   On day of discharge, the patient reports that their mood is stable. The patient denied having suicidal thoughts for more than 48 hours prior to discharge.  Patient denies having homicidal thoughts.  Patient denies having auditory hallucinations.  Patient denies any visual hallucinations or other symptoms of psychosis. The patient was motivated to continue taking medication with a goal of continued improvement in mental health.   The patient reports their target psychiatric symptoms of depression and irritability responded well to the psychiatric medications, and the patient reports overall benefit other psychiatric hospitalization. Supportive psychotherapy was provided to the patient. The patient also participated in regular group therapy while hospitalized. Coping skills, problem solving as well as relaxation therapies were also part of the unit programming.  Labs were reviewed with the patient, and abnormal results were discussed with the patient.  The patient is able to verbalize their individual safety plan to this provider.  # It is recommended to the patient to continue psychiatric medications as prescribed, after discharge from the hospital.    # It is recommended to the patient to follow up with your outpatient psychiatric provider and PCP.  # It was discussed with the patient, the impact of alcohol, drugs, tobacco have been there overall psychiatric and medical wellbeing, and total abstinence from substance use was recommended the patient.ed.  # Prescriptions provided or sent directly to preferred pharmacy at discharge. Patient agreeable to plan. Given opportunity to ask questions. Appears to feel comfortable with discharge.    # In the event of worsening symptoms, the patient is instructed to call the crisis  hotline, 911 and or go to the nearest  ED for appropriate evaluation and treatment of symptoms. To follow-up with primary care provider for other medical issues, concerns and or health care needs  # Patient was discharged home with plan to go to [facility] tomorrow AM 7/10.    Principal Problem: MDD (major depressive disorder), recurrent episode, severe (HCC) Discharge Diagnoses: Principal Problem:   MDD (major depressive disorder), recurrent episode, severe (HCC) Active Problems:   Opioid dependence with opioid-induced mood disorder (HCC)   HIV disease (HCC)   Alcohol use disorder   Sedative, hypnotic or anxiolytic use disorder, moderate, in controlled environment   Stimulant use disorder   Substance-induced psychotic disorder [facility])    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   Neuromuscular disorder (HCC)    fibromyalgia   Pulmonary embolism (HCC)    2012   SLAP tear of shoulder    left   Urticarial rash 12/05/2022   Vaccine counseling 05/09/2023    Past Surgical History:  Procedure Laterality Date   COLONOSCOPY     I & D EXTREMITY Left 07/16/2020   Procedure: IRRIGATION AND DEBRIDEMENT FOREARM;  Surgeon: Shari Easter, MD;  Location: [facility] OR;  Service: Orthopedics;  Laterality: Left;   IRRIGATION AND DEBRIDEMENT ELBOW Right 11/21/2023   Procedure: IRRIGATION AND DEBRIDEMENT ELBOW;  Surgeon: Beuford Anes, MD;  Location: [facility]S;  Service: Orthopedics;  Laterality: Right;   SHOULDER ARTHROSCOPY WITH BICEPS TENDON REPAIR Left 10/22/2020   Procedure: LEFT SHOULDER ARTHROSCOPY, BICEPS TENODESIS;  Surgeon: Jerri Kay HERO, MD;  Location: [facility];  Service: Orthopedics;  Laterality: Left;   SUBACROMIAL DECOMPRESSION Left 10/22/2020   Procedure: SUBACROMIAL DECOMPRESSION;  Surgeon: Jerri Kay HERO, MD;  Location:  SURGERY CENTER;  Service: Orthopedics;  Laterality: Left;   UPPER GI ENDOSCOPY      Family History:  Family History  Problem Relation Age of Onset   Fibromyalgia Mother    Cancer Father     Social History:  Social History   Socioeconomic History   Marital status: Married    Spouse name: Not on file   Number of children: 2   Years of education: Not on file   Highest education level: Not on file  Occupational History   Not on file  Tobacco Use   Smoking status: Every Day    Types: E-cigarettes    Passive exposure: Past   Smokeless tobacco: Former    Types: Snuff  Vaping Use   Vaping status: Every Day   Substances: Nicotine   Substance and Sexual Activity   Alcohol use: Not Currently    Alcohol/week: 30.0 standard drinks of alcohol    Types: 30 Standard drinks or equivalent per week    Comment: 1/5 a day reported by patient   Drug use: Yes    Types: Cocaine, Methamphetamines, Marijuana   Sexual activity: Yes    Comment: declined condoms 09/07/23  Other Topics Concern   Not on file  Social History Narrative   Right Handed    Has two sons    Lives in a one story home    Social Drivers of Health   Financial  Resource Strain: Not on file  Food Insecurity: Food Insecurity Present (12/16/2023)   Hunger Vital Sign    Worried About Running Out of Food in the Last Year: Sometimes true    Ran Out of Food in the Last Year: Sometimes true  Transportation Needs: No Transportation Needs (12/16/2023)   PRAPARE - Administrator, Civil Service (Medical): No    Lack of Transportation (Non-Medical): No  Physical Activity: Not on file  Stress: Not on file  Social Connections: Unknown (11/18/2023)   Social Connection and Isolation Panel    Frequency of Communication with Friends and  Family: Not on file    Frequency of Social Gatherings with Friends and Family: Not on file    Attends Religious Services: Not on file    Active Member of Clubs or Organizations: Not on file    Attends Banker Meetings: Not on file    Marital Status: Divorced  Intimate Partner Violence: Not At Risk (12/16/2023)   Humiliation, Afraid, Rape, and Kick questionnaire    Fear of Current or Ex-Partner: No    Emotionally Abused: No    Physically Abused: No    Sexually Abused: No     Physical Findings: AIMS:  , ,  ,  ,    CIWA:  CIWA-Ar Total: 0 COWS:     Musculoskeletal: Strength & Muscle Tone: within normal limits Gait & Station: normal Patient leans: N/A  Psychiatric Specialty Exam  Presentation  General Appearance: Appropriate for Environment; Casual  Eye Contact:Good  Speech:Clear and Coherent; Normal Rate  Speech Volume:Normal   Mood and Affect  Mood:Euthymic  Affect:Appropriate; Congruent   Thought Process  Thought Processes:Coherent; Goal Directed; Linear  Descriptions of Associations:Intact  Orientation:Full (Time, Place and Person)  Thought Content:Logical  Hallucinations:Hallucinations: None  Ideas of Reference:None  Suicidal Thoughts:Suicidal Thoughts: No  Homicidal Thoughts:Homicidal Thoughts: No   Sensorium  Memory:Immediate Good; Recent Good; Remote Good  Judgment:Fair  Insight:Fair   Executive Functions  Concentration:Good  Attention Span:Good  Recall:Good  Fund of Knowledge:Good  Language:Good   Psychomotor Activity  Psychomotor Activity:Psychomotor Activity: Normal   Assets  Assets:Communication Skills; Desire for Improvement; Physical Health   Sleep  Sleep:Sleep: Good   Physical Exam: Physical Exam ROS Blood pressure (!) 134/99, pulse 85, temperature 98.6 F (37 C), temperature source Oral, resp. rate 18, height 5' 6 (1.676 m), weight 84.5 kg, SpO2 98%. Body mass index is 30.05 kg/m.  Social  History   Tobacco Use  Smoking Status Every Day   Types: E-cigarettes   Passive exposure: Past  Smokeless Tobacco Former   Types: Snuff   Tobacco Cessation:  N/A, patient does not currently use tobacco products  Blood Alcohol level:  Lab Results  Component Value Date   ETH 81 (H) 12/15/2023   ETH <15 11/17/2023    Metabolic Disorder Labs:  Lab Results  Component Value Date   HGBA1C 5.3 12/15/2023   MPG 105.41 12/15/2023   No results found for: PROLACTIN Lab Results  Component Value Date   CHOL 155 12/15/2023   TRIG 153 (H) 12/15/2023   HDL 38 (L) 12/15/2023   CHOLHDL 4.1 12/15/2023   VLDL 31 12/15/2023   LDLCALC 86 12/15/2023   LDLCALC 88 09/07/2023    See Psychiatric Specialty Exam and Suicide Risk Assessment completed by Attending Physician prior to discharge.  Discharge destination:  Home with mom with plan to go to [facility] tomorrow  Is patient on multiple antipsychotic therapies at discharge:  No  Has Patient had three or more failed trials of antipsychotic monotherapy by history:  No  Recommended Plan for Multiple Antipsychotic Therapies: NA      Follow-up Information     Services, [facility] Follow up.   Why: Referral made Contact information: [address] [place] [address] [phone]         [place]. Schedule an appointment as soon as possible for a visit.   Why: Please call this provider to schedule a hospital follow up appointment for Virtual therapy and medication management services. Contact information: [address]  [address] [place] [address] [phone]                  Follow-up recommendations:   Activity:  as tolerated Diet:  heart healthy   Comments:  Prescriptions were given at discharge.  Patient is agreeable with the discharge plan.  Patient was given an opportunity to ask questions.  Patient appears to feel comfortable with discharge and denies any current suicidal or homicidal  thoughts.    Patient is instructed prior to discharge to: Take all medications as prescribed by mental healthcare provider. Report any adverse effects and or reactions from the medicines to outpatient provider promptly. In the event of worsening symptoms, patient is instructed to call the crisis hotline, 911 and or go to the nearest ED for appropriate evaluation and treatment of symptoms. Patient is to follow-up with primary care provider for other medical issues, concerns and or health care needs.     Signed: Prentice Espy, MD 12/21/2023, 9:04 AM

## 2023-12-21 NOTE — [facility] Suicide Risk Assessment (Signed)
 [facility] Discharge Suicide Risk Assessment   Principal Problem: MDD (major depressive disorder), recurrent episode, severe (HCC) Discharge Diagnoses: Principal Problem:   MDD (major depressive disorder), recurrent episode, severe (HCC) Active Problems:   Opioid dependence with opioid-induced mood disorder (HCC)   HIV disease (HCC)   Alcohol use disorder   Sedative, hypnotic or anxiolytic use disorder, moderate, in controlled environment   Stimulant use disorder   Substance-induced psychotic disorder (HCC)   Reason for Admission: Jimmy Fisher is a 43 yr old male who presented on 7/3 to [facility] requesting Detox after a multi-day binge, he was admitted to [facility] on 7/4.  PPHx is significant for MDD, GAD, Polysubstance Abuse (EtOH, Meth), Suicide Attempts (11/2023), a remote history of Self Injurious Behavior, and Multiple Psychiatric Hospitalizations.    Hospital Course  During the patient's hospitalization, patient had extensive initial psychiatric evaluation, and follow-up psychiatric evaluations every day.  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  The patient denies having side effects to prescribed psychiatric medication.  Gradually, patient started adjusting to milieu. The patient was evaluated each day by a clinical provider to ascertain response to treatment. Improvement was  noted by the patient's report of decreasing symptoms, improved sleep and appetite, affect, medication tolerance, behavior, and participation in unit programming.  Patient was asked each day to complete a self inventory noting mood, mental status, pain, new symptoms, anxiety and concerns.    Symptoms were reported as significantly decreased or resolved completely by discharge.   On day of discharge, the patient reports that their mood is stable. The patient denied having suicidal thoughts for more than 48 hours prior to discharge.  Patient denies having homicidal thoughts.  Patient denies having auditory hallucinations.  Patient denies any visual hallucinations or other symptoms of psychosis. The patient was motivated to continue taking medication with a goal of continued improvement in mental health.   The patient reports their target psychiatric symptoms of depression and irritability responded well to the psychiatric medications, and the patient reports overall benefit other psychiatric hospitalization. Supportive psychotherapy was provided to the patient. The patient also participated in regular group therapy while hospitalized. Coping skills, problem solving as well as relaxation therapies were also part of the unit programming.  Labs were reviewed with the patient, and abnormal results were discussed with the patient.  The patient is able to verbalize their individual safety plan to this provider.  # It is recommended to the patient to continue psychiatric medications as prescribed, after discharge from the hospital.    # It is recommended to the patient to follow up with your outpatient psychiatric provider and PCP.  # It was discussed with the patient, the impact of alcohol, drugs, tobacco have been there overall psychiatric and medical wellbeing, and total abstinence from substance use was recommended the patient.ed.  # Prescriptions provided or sent directly to preferred pharmacy at  discharge. Patient agreeable to plan. Given opportunity to ask questions. Appears to feel comfortable with discharge.    #  In the event of worsening symptoms, the patient is instructed to call the crisis hotline, 911 and or go to the nearest ED for appropriate evaluation and treatment of symptoms. To follow-up with primary care provider for other medical issues, concerns and or health care needs  # Patient was discharged home with plan to go to [facility] tomorrow AM 7/10.   Total Time spent with patient: 1 hour  Musculoskeletal: Strength & Muscle Tone: within normal limits Gait & Station: normal Patient leans: N/A  Psychiatric Specialty Exam  Presentation  General Appearance: Appropriate for Environment; Casual   Eye Contact:Good   Speech:Clear and Coherent; Normal Rate   Speech Volume:Normal   Handedness:Right    Mood and Affect  Mood:Euthymic   Duration of Depression Symptoms: Greater than two weeks   Affect:Appropriate; Congruent    Thought Process  Thought Processes:Coherent; Goal Directed; Linear   Descriptions of Associations:Intact   Orientation:Full (Time, Place and Person)   Thought Content:Logical   History of Schizophrenia/Schizoaffective disorder:No   Duration of Psychotic Symptoms:N/A   Hallucinations:Hallucinations: None  Ideas of Reference:None   Suicidal Thoughts:Suicidal Thoughts: No  Homicidal Thoughts:Homicidal Thoughts: No   Sensorium  Memory:Immediate Good; Recent Good; Remote Good   Judgment:Fair   Insight:Fair    Executive Functions  Concentration:Good   Attention Span:Good   Recall:Good   Fund of Knowledge:Good   Language:Good    Psychomotor Activity  Psychomotor Activity:Psychomotor Activity: Normal   Assets  Assets:Communication Skills; Desire for Improvement; Physical Health    Sleep  Sleep:Sleep: Good   Physical Exam: Physical Exam ROS Blood pressure (!) 134/99, pulse 85,  temperature 98.6 F (37 C), temperature source Oral, resp. rate 18, height 5' 6 (1.676 m), weight 84.5 kg, SpO2 98%. Body mass index is 30.05 kg/m.  Mental Status Per Nursing Assessment::   On Admission:  Self-harm thoughts  Demographic Factors:  Male and Low socioeconomic status  Loss Factors: NA  Historical Factors: Impulsivity  Risk Reduction Factors:   Living with another person, especially a relative, Positive social support, and Positive coping skills or problem solving skills  Continued Clinical Symptoms:  Alcohol/Substance Abuse/Dependencies More than one psychiatric diagnosis Previous Psychiatric Diagnoses and Treatments  Cognitive Features That Contribute To Risk:  None    Suicide Risk:  Minimal: No identifiable suicidal ideation.  Patients presenting with no risk factors but with morbid ruminations; may be classified as minimal risk based on the severity of the depressive symptoms   Follow-up Information     [facility] Follow up.   Why: Referral made Contact information: [address] [place] [address] [phone]         [facility] Follow up on 12/23/2023.   Why: Please call this provider on 12/23/23 at 9:00 am to schedule a hospital follow up appointment for Virtual therapy and medication management services. Contact information: [address]  [address] [place] [address] [phone]         [facility] Follow up.   Why: You may call this provider [facility]) if you wish to participate in Virtual substance use intensive outpatient therapy services. Contact information: [address] [place] [address] [phone]         [facility] Follow up.   Specialty: Addiction Medicine Why: Referral made Contact information: [address] [place] [address] [phone]                 Plan Of Care/Follow-up  recommendations:  Activity: as  tolerated  Diet: heart healthy  Other: -Follow-up with your outpatient psychiatric provider -instructions on appointment date, time, and address (location) are provided to you in discharge paperwork.  -Take your psychiatric medications as prescribed at discharge - instructions are provided to you in the discharge paperwork  -Follow-up with outpatient primary care doctor and other specialists -for management of chronic medical disease, including: HIV  -Testing: Follow-up with outpatient provider for abnormal lab results: none  -Recommend abstinence from alcohol, tobacco, and other illicit drug use at discharge.   -If your psychiatric symptoms recur, worsen, or if you have side effects to your psychiatric medications, call your outpatient psychiatric provider, 911, [phone] or go to the nearest emergency department.  -If suicidal thoughts recur, call your outpatient psychiatric provider, 911, 988 or go to the nearest emergency department.   Prentice Espy, MD 12/21/2023, 2:51 PM

## 2023-12-21 NOTE — [facility] Suicide Risk Assessment (Signed)
 [facility] INPATIENT:  Family/Significant Other Suicide Prevention Education  Suicide Prevention Education:  Education Completed; Champ Keetch (mom) [phone], (name of family member/significant other) has been identified by the patient as the family member/significant other with whom the patient will be residing, and identified as the person(s) who will aid the patient in the event of a mental health crisis (suicidal ideations/suicide attempt).  With written consent from the patient, the family member/significant other has been provided the following suicide prevention education, prior to the and/or following the discharge of the patient.  Mom said they don't have any guns or weapons.  Mom said she will secure medications and sharp objets (such as knives and scissors).  Mom said that she visited patient twice and he is doing well.  She said that she doesn't have any safety concerns about patient being discharged.  Per mom's request, CSW emailed patient resources via secure email [email or web address]   The suicide prevention education provided includes the following: Suicide risk factors Suicide prevention and interventions National Suicide Hotline telephone number [facility] assessment telephone number [facility] Emergency Assistance [facility] and/or Residential Mobile Crisis Unit telephone number  Request made of family/significant other to: Remove weapons (e.g., guns, rifles, knives), all items previously/currently identified as safety concern.   Remove drugs/medications (over-the-counter, prescriptions, illicit drugs), all items previously/currently identified as a safety concern.  The family member/significant other verbalizes understanding of the suicide prevention education information provided.  The family member/significant other agrees to remove the items of safety concern listed above.  Lateesha Bezold O Shaquandra Galano, LCSWA 12/21/2023, 10:18 AM

## 2023-12-21 NOTE — Hospital Course (Signed)
 Reason for Admission: Jimmy Fisher is a 43 yr old male who presented on 7/3 to [facility] requesting Detox after a multi-day binge, he was admitted to [facility] on 7/4.  PPHx is significant for MDD, GAD, Polysubstance Abuse (EtOH, Meth), Suicide Attempts (11/2023), a remote history of Self Injurious Behavior, and Multiple Psychiatric Hospitalizations.    Hospital Course  During the patient's hospitalization, patient had extensive initial psychiatric evaluation, and follow-up psychiatric evaluations every day.  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  The patient denies having side effects to prescribed psychiatric medication.  Gradually, patient started adjusting to milieu. The patient was evaluated each day by a clinical provider to ascertain response to treatment. Improvement was noted by the patient's report of decreasing symptoms, improved sleep and appetite, affect, medication tolerance, behavior, and participation in unit programming.  Patient was asked each day to complete a self inventory noting mood, mental status, pain, new symptoms, anxiety and concerns.    Symptoms were reported as significantly decreased or resolved completely by discharge.   On day of discharge, the patient reports that their mood is stable. The patient denied having suicidal thoughts for more than 48 hours  prior to discharge.  Patient denies having homicidal thoughts.  Patient denies having auditory hallucinations.  Patient denies any visual hallucinations or other symptoms of psychosis. The patient was motivated to continue taking medication with a goal of continued improvement in mental health.   The patient reports their target psychiatric symptoms of depression and irritability responded well to the psychiatric medications, and the patient reports overall benefit other psychiatric hospitalization. Supportive psychotherapy was provided to the patient. The patient also participated in regular group therapy while hospitalized. Coping skills, problem solving as well as relaxation therapies were also part of the unit programming.  Labs were reviewed with the patient, and abnormal results were discussed with the patient.  The patient is able to verbalize their individual safety plan to this provider.  # It is recommended to the patient to continue psychiatric medications as prescribed, after discharge from the hospital.    # It is recommended to the patient to follow up with your outpatient psychiatric provider and PCP.  # It was discussed with the patient, the impact of alcohol, drugs, tobacco have been there overall psychiatric and medical wellbeing, and total abstinence from substance use was recommended the patient.ed.  # Prescriptions provided or sent directly to preferred pharmacy at discharge. Patient agreeable to plan. Given opportunity to ask questions. Appears to feel comfortable with discharge.    # In the event of worsening symptoms, the patient is instructed to call the crisis hotline, 911 and or go to the nearest ED for appropriate evaluation and treatment of symptoms. To follow-up with primary care provider for other medical issues, concerns and or health care needs  # Patient was discharged home with plan to go to [facility] tomorrow AM 7/10.

## 2023-12-21 NOTE — Group Note (Signed)
 Recreation Therapy Group Note   Group Topic:Leisure Education  Group Date: 12/21/2023 Start Time: 0932 End Time: 1040 Facilitators: Parul Porcelli-McCall, LRT,CTRS Location: [address] Dayroom   Group Topic: Leisure Education  Goal Area(s) Addresses:  Patient will identify positive leisure programs for use post discharge. Patient will identify at least one positive benefit of participation in leisure activities.  Patient will work effectively work with peer by sharing ideas and contributing to Social worker.  Behavioral Response:   Intervention: Innovation, Group Presentation   Activity: In pairs, patients were asked to create a program they would implement in the community if given the chance. Groups were to create a name for the program, identify the audience the program is geared towards (ie: everybody, specific ages, different demographics, etc), where the program will be held (ie: community center, Engineering geologist, school, church, Catering manager) and identify the benefits of the program. Groups would then present their programs to the group.  Education:  Leisure Scientist, physiological, Special educational needs teacher, Teamwork, Discharge Planning  Education Outcome: Acknowledges education/In group clarification offered/Needs additional education.    Affect/Mood: N/A   Participation Level: Did not attend    Clinical Observations/Individualized Feedback:     Plan: Continue to engage patient in RT group sessions 2-3x/week.   Ami Mally-McCall, LRT,CTRS 12/21/2023 12:56 PM

## 2023-12-21 NOTE — Progress Notes (Signed)
 [facility] MD Progress Note  12/21/2023 10:39 AM Jimmy Fisher  MRN:  [id number]  Principal Problem: MDD (major depressive disorder), recurrent episode, severe (HCC) Diagnosis: Principal Problem:   MDD (major depressive disorder), recurrent episode, severe (HCC) Active Problems:   Opioid dependence with opioid-induced mood disorder (HCC)   HIV disease (HCC)   Alcohol use disorder   Sedative, hypnotic or anxiolytic use disorder, moderate, in controlled environment   Stimulant use disorder   Substance-induced psychotic disorder (HCC)   Reason for Admission:  Jimmy Fisher is a 43 yr old male who presented on 7/3 to [facility] requesting Detox after a multi-day binge, he was admitted to [facility] on 7/4.  PPHx is significant for MDD, GAD, Polysubstance Abuse (EtOH, Meth), Suicide Attempts (11/2023), a remote history of Self Injurious Behavior, and Multiple Psychiatric Hospitalizations.  (admitted on 12/16/2023, total  LOS: 5 days )   ASSESSMENT: Patient continues to be relatively stable with ongoing medication regiment. Withdrawal symptoms minimal. Plan is to discharge patient tomorrow to mom so that he can go to [facility] Friday. [facility] asked that his HIV status be addressed so ordering a HIV quant.    PLAN: MDD, Recurrent, Severe, w/out Psychosis  GAD: -Continue Prozac  40 mg daily for depression and anxiety -Continue Zyprexa  5 mg QHS for augmentation and mood stability -Continue Agitation Protocol: Haldol /Ativan /Benadryl      Withdrawal: -Continue CIWA, last score= 5  @ 7/7 -Continue Ativan  1 mg q6 PRN CIWA>10 -Continue Bentyl  20 mg q6 PRN spasms -Continue Imodium  2-4 mg PRN diarrhea -Continue Zofran -ODT 4 mg q6 PRN nausea -Continue Thiamine  100 mg daily for nutritional supplementation -Continue Multivitamin daily for nutritional supplementation -Continue Folic Acid  1 mg daily for nutritional supplementation    HIV: -Continue Biktarvy  50-200-25 mg daily -HIV quant ordered for today    Nicotine  Dependence: -Continue Nicotine  Patch 21 mg daily     -Continue Neosporin BID for thigh rash -Continue Lipitor 10 mg daily -Continue Losartan  25 mg daily -Continue Protonix  EC 40 mg daily -Continue Carafate  1 g TID -Continue PRN's: Tylenol , Maalox, Atarax , Milk of Magnesia   Disposition Planning: -- Estimated LOS: 5 days --Estimated Discharge Date: 7/10 --Barriers to Discharge: residential rehab placement, mood stability -- Discharge Goals: Return home with outpatient referrals for mental health follow-up including medication management/psychotherapy  Subjective: The patient was seen and evaluated on the unit. On assessment today.  He reports to be eating and sleeping well.  Denies SI/HI/AVH.  Reports minimal withdrawal symptoms. Otherwise, no other acute concerns. Patient has he feels ready to go to [facility] whenever there is a bed available. He reports needing to discharge to the care of mom to go sign a plea bargain to do court-ordered [facility] to prevent going to jail  Objective: Chart Review from last 24 hours:  The patient's chart was reviewed and nursing notes were reviewed. The patient's case was discussed in multidisciplinary team meeting.  - Overnight events to report per chart review / staff report: none - Patient took all prescribed medications yes - Patient received the following PRNs: hydroxyzine  and tylenol   Current Medications: Current Facility-Administered Medications  Medication Dose Route Frequency Provider Last Rate Last Admin   acetaminophen  (TYLENOL ) tablet 650 mg  650 mg Oral Q6H PRN Brent, Amanda C, NP   650 mg at 12/20/23 1505   alum & mag hydroxide-simeth (MAALOX/MYLANTA) 200-200-20 MG/5ML suspension 30 mL  30 mL Oral Q4H PRN Brent, Amanda C, NP       atorvastatin  (LIPITOR) tablet 10 mg  10 mg Oral Daily Brent, Amanda C, NP   10 mg at 12/20/23 9075   bictegravir-emtricitabine -tenofovir  AF (BIKTARVY ) 50-200-25 MG per tablet 1 tablet  1 tablet Oral  Daily Thresa Alan BROCKS, NP   1 tablet at 12/21/23 9355   haloperidol  (HALDOL ) tablet 5 mg  5 mg Oral TID PRN Brent, Amanda C, NP       And   diphenhydrAMINE  (BENADRYL ) capsule 50 mg  50 mg Oral TID PRN Brent, Amanda C, NP       haloperidol  lactate (HALDOL ) injection 5 mg  5 mg Intramuscular TID PRN Brent, Amanda C, NP       And   diphenhydrAMINE  (BENADRYL ) injection 50 mg  50 mg Intramuscular TID PRN Brent, Amanda C, NP       And   LORazepam  (ATIVAN ) injection 2 mg  2 mg Intramuscular TID PRN Brent, Amanda C, NP       haloperidol  lactate (HALDOL ) injection 10 mg  10 mg Intramuscular TID PRN Brent, Amanda C, NP       And   diphenhydrAMINE  (BENADRYL ) injection 50 mg  50 mg Intramuscular TID PRN Brent, Amanda C, NP       And   LORazepam  (ATIVAN ) injection 2 mg  2 mg Intramuscular TID PRN Brent, Amanda C, NP       FLUoxetine  (PROZAC ) capsule 40 mg  40 mg Oral Daily Brent, Amanda C, NP   40 mg at 12/20/23 9076   folic acid  (FOLVITE ) tablet 1 mg  1 mg Oral Daily Brent, Amanda C, NP   1 mg at 12/20/23 9076   hydrOXYzine  (ATARAX ) tablet 25 mg  25 mg Oral TID PRN Pashayan, Alexander S, DO   25 mg at 12/20/23 2220   loperamide  (IMODIUM ) capsule 2-4 mg  2-4 mg Oral PRN Pashayan, Alexander S, DO       LORazepam  (ATIVAN ) tablet 1 mg  1 mg Oral Q6H PRN Pashayan, Alexander S, DO       losartan  (COZAAR ) tablet 25 mg  25 mg Oral Daily Brent, Amanda C, NP   25 mg at 12/20/23 9075   multivitamin with minerals tablet 1 tablet  1 tablet Oral Daily Brent, Amanda C, NP   1 tablet at 12/20/23 9076   neomycin -bacitracin -polymyxin [phone] OINT   Apply externally BID Pashayan, Alexander S, DO   1 Application at 12/20/23 1656   nicotine  polacrilex (NICORETTE ) gum 2 mg  2 mg Oral PRN Bobbitt, Shalon E, NP   2 mg at 12/20/23 2316   OLANZapine  (ZYPREXA ) tablet 5 mg  5 mg Oral QHS Trypp Heckmann, MD   5 mg at 12/20/23 2220   ondansetron  (ZOFRAN -ODT) disintegrating tablet 4 mg  4 mg Oral Q6H PRN Pashayan, Alexander S, DO        pantoprazole  (PROTONIX ) EC tablet 40 mg  40 mg Oral BID AC Brent, Amanda C, NP   40 mg at 12/21/23 0644   sucralfate  (CARAFATE ) 1 GM/10ML suspension 1 g  1 g Oral TID WC Brent, Amanda C, NP   1 g at 12/21/23 0644   sucralfate  (CARAFATE ) tablet 1 g  1 g Oral TID WC Green, Terri L, RPH       thiamine  (Vitamin B-1) tablet 100 mg  100 mg Oral Daily Brent, Amanda C, NP   100 mg at 12/20/23 9075    Lab Results: No results found for this or any previous visit (from the past 48 hours).  Blood Alcohol level:  Lab Results  Component Value Date   ETH 81 (H) 12/15/2023   ETH <15 11/17/2023    Metabolic Labs: Lab Results  Component Value Date   HGBA1C 5.3 12/15/2023   MPG 105.41 12/15/2023   No results found for: PROLACTIN Lab Results  Component Value Date   CHOL 155 12/15/2023   TRIG 153 (H) 12/15/2023   HDL 38 (L) 12/15/2023   CHOLHDL 4.1 12/15/2023   VLDL 31 12/15/2023   LDLCALC 86 12/15/2023   LDLCALC 88 09/07/2023    Physical Findings: AIMS: No  CIWA:  CIWA-Ar Total: 0 COWS:     Psychiatric Specialty Exam: General Appearance: Appropriate for Environment; Casual   Eye Contact: Good   Speech: Clear and Coherent; Normal Rate   Volume: Normal   Mood: Euthymic   Affect: Appropriate; Congruent   Thought Content: Logical   Suicidal Thoughts: Suicidal Thoughts: No    Homicidal Thoughts: Homicidal Thoughts: No    Thought Process: Coherent; Goal Directed; Linear   Orientation: Full (Time, Place and Person)     Memory: Immediate Good; Recent Good; Remote Good   Judgment: Fair   Insight: Fair   Concentration: Good   Recall: Good   Fund of Knowledge: Good   Language: Good   Psychomotor Activity: Psychomotor Activity: Normal    Assets: Communication Skills; Desire for Improvement; Physical Health   Sleep: Sleep: Good     Review of Systems ROS  Vital Signs: Blood pressure (!) 134/99, pulse 85, temperature 98.6 F (37 C), temperature source  Oral, resp. rate 18, height 5' 6 (1.676 m), weight 84.5 kg, SpO2 98%. Body mass index is 30.05 kg/m. Physical Exam  I certify that inpatient services furnished can reasonably be expected to improve the patient's condition.   Signed: Prentice Espy, MD 12/21/2023, 10:39 AM

## 2023-12-22 ENCOUNTER — Other Ambulatory Visit (HOSPITAL_COMMUNITY): Payer: Self-pay

## 2023-12-22 ENCOUNTER — Other Ambulatory Visit: Payer: Self-pay

## 2023-12-23 ENCOUNTER — Other Ambulatory Visit (HOSPITAL_COMMUNITY): Payer: Self-pay

## 2023-12-26 ENCOUNTER — Other Ambulatory Visit: Payer: Self-pay

## 2023-12-26 NOTE — Progress Notes (Signed)
 Specialty Pharmacy Refill Coordination Note  Jimmy Fisher is a 43 y.o. male contacted today regarding refills of specialty medication(s) Bictegravir-Emtricitab-Tenofov (BIKTARVY )   Patient requested [facility] at [facility] [place] date: 12/21/23   Medication will be filled on 12/21/23.    Medication was filled without refill outreach, was able to make contact with patient today to complete assessment.

## 2023-12-26 NOTE — Progress Notes (Signed)
 Specialty Pharmacy Ongoing Clinical Assessment Note  Jimmy Fisher is a 43 y.o. male who is being followed by the specialty pharmacy service for RxSp HIV   Patient's specialty medication(s) reviewed today: Bictegravir-Emtricitab-Tenofov (BIKTARVY )   Missed doses in the last 4 weeks: 0   Patient/Caregiver did not have any additional questions or concerns.   Therapeutic benefit summary: Patient is NOT achieving benefit   Adverse events/side effects summary: No adverse events/side effects   Patient's therapy is appropriate to: Continue    Goals Addressed             This Visit's Progress    Achieve Undetectable HIV Viral Load < 20   Worsening    Patient is not on track and worsening. Patient will work on increased adherence. Patient's viral load increased to 170 copies/mL per labs on 11/21/23      Comply with lab assessments   On track    Patient is on track. Patient will maintain adherence      Increase CD4 count until steady state   No change    Patient is not on track and no change. Patient will work on increased adherence      Maintain optimal adherence to therapy   Improving    Patient is not on track and improving. Patient will work on increased adherence. Patient reported no missed doses this past month.         Follow up: 3 months  [facility]

## 2023-12-26 NOTE — Progress Notes (Unsigned)
 Chief complaint: follow-up for HIV disease on medications  Subjective:    Patient ID: Jimmy Fisher, male    DOB: 1980/07/02, 43 y.o.   MRN: [id number]  HPI  Discussed the use of AI scribe software for clinical note transcription with the patient, who gave verbal consent to proceed.  History of Present Illness   Jimmy Fisher is a 43 year old male with HIV who presents for follow-up after hospitalization for an abscess and infection in his arm.  He experienced a drug relapse involving intravenous drug use, resulting in a severe abscess on his right elbow and a widespread infection. He was hospitalized for incision and drainage and treated with linezolid  and Augmentin . He was restarted on Biktarvy  for HIV management. His viral load is 170, and he struggles with medication adherence, especially during drug relapses. He engages in promiscuous sexual activity during drug use and has been tested for STIs, currently testing clear. He is staying at his mother's place but anticipates homelessness soon after he completes a drug rehab program, which may impact his ability to manage his HIV effectively.   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   Neuromuscular disorder (HCC)    fibromyalgia   Pulmonary embolism (HCC)    2012   SLAP tear of shoulder    left   Urticarial rash 12/05/2022   Vaccine counseling 05/09/2023    Past Surgical History:  Procedure Laterality Date   COLONOSCOPY     I & D EXTREMITY Left 07/16/2020   Procedure: IRRIGATION AND DEBRIDEMENT  FOREARM;  Surgeon: Shari Easter, MD;  Location: [facility];  Service: Orthopedics;  Laterality: Left;   IRRIGATION AND DEBRIDEMENT ELBOW Right 11/21/2023   Procedure: IRRIGATION AND DEBRIDEMENT ELBOW;  Surgeon: Beuford Anes, MD;  Location: [facility];  Service: Orthopedics;  Laterality: Right;   SHOULDER ARTHROSCOPY WITH BICEPS TENDON REPAIR Left 10/22/2020   Procedure: LEFT SHOULDER ARTHROSCOPY, BICEPS TENODESIS;  Surgeon: Jerri Kay HERO, MD;  Location: [facility];  Service: Orthopedics;  Laterality: Left;   SUBACROMIAL DECOMPRESSION Left 10/22/2020   Procedure: SUBACROMIAL DECOMPRESSION;  Surgeon: Jerri Kay HERO, MD;  Location: [facility];  Service: Orthopedics;  Laterality: Left;   UPPER GI ENDOSCOPY      Family History  Problem Relation Age of Onset   Fibromyalgia Mother    Cancer Father       Social History   Socioeconomic History   Marital status: Married    Spouse name: Not on file   Number of children: 2   Years of education: Not on file   Highest education level: Not on file  Occupational History   Not on file  Tobacco Use   Smoking status: Every Day    Types: E-cigarettes    Passive exposure: Past   Smokeless tobacco: Former    Types: Snuff  Vaping Use   Vaping status: Every Day   Substances: Nicotine   Substance and Sexual Activity   Alcohol use: Not Currently  Alcohol/week: 30.0 standard drinks of alcohol    Types: 30 Standard drinks or equivalent per week    Comment: 1/5 a day reported by patient   Drug use: Yes    Types: Cocaine, Methamphetamines, Marijuana   Sexual activity: Yes    Comment: declined condoms 09/07/23  Other Topics Concern   Not on file  Social History Narrative   Right Handed    Has two sons    Lives in a one story home    Social Drivers of Health   Financial Resource Strain: Not on file  Food Insecurity: Food Insecurity Present (12/16/2023)   Hunger Vital Sign    Worried About Running Out of Food in the Last  Year: Sometimes true    Ran Out of Food in the Last Year: Sometimes true  Transportation Needs: No Transportation Needs (12/16/2023)   PRAPARE - Administrator, Civil Service (Medical): No    Lack of Transportation (Non-Medical): No  Physical Activity: Not on file  Stress: Not on file  Social Connections: Unknown (11/18/2023)   Social Connection and Isolation Panel    Frequency of Communication with Friends and Family: Not on file    Frequency of Social Gatherings with Friends and Family: Not on file    Attends Religious Services: Not on file    Active Member of Clubs or Organizations: Not on file    Attends Banker Meetings: Not on file    Marital Status: Divorced    Allergies  Allergen Reactions   Doxycycline  Rash    ? Sun exposed rash from doxy vs adverse drug rxn vs completely not related     Current Outpatient Medications:    atorvastatin  (LIPITOR) 10 MG tablet, Take 1 tablet (10 mg total) by mouth daily., Disp: 30 tablet, Rfl: 0   atorvastatin  (LIPITOR) 10 MG tablet, Take 1 tablet (10 mg total) by mouth daily., Disp: 30 tablet, Rfl: 0   bictegravir-emtricitabine -tenofovir  AF (BIKTARVY ) 50-200-25 MG TABS tablet, Take 1 tablet by mouth daily., Disp: 30 tablet, Rfl: 0   bictegravir-emtricitabine -tenofovir  AF (BIKTARVY ) 50-200-25 MG TABS tablet, Take 1 tablet by mouth daily., Disp: 30 tablet, Rfl: 0   FLUoxetine  (PROZAC ) 40 MG capsule, Take 1 capsule (40 mg total) by mouth daily., Disp: 30 capsule, Rfl: 0   FLUoxetine  (PROZAC ) 40 MG capsule, Take 1 capsule (40 mg total) by mouth daily., Disp: 30 capsule, Rfl: 0   losartan  (COZAAR ) 25 MG tablet, Take 1 tablet (25 mg total) by mouth daily., Disp: 30 tablet, Rfl: 0   losartan  (COZAAR ) 25 MG tablet, Take 1 tablet (25 mg total) by mouth daily., Disp: 30 tablet, Rfl: 0   OLANZapine  (ZYPREXA ) 5 MG tablet, Take 1 tablet (5 mg total) by mouth at bedtime., Disp: 30 tablet, Rfl: 0   OLANZapine  (ZYPREXA ) 5 MG tablet, Take  1 tablet (5 mg total) by mouth at bedtime., Disp: 30 tablet, Rfl: 0   omeprazole  (PRILOSEC) 20 MG capsule, Take 1 capsule (20 mg total) by mouth 2 (two) times daily., Disp: 60 capsule, Rfl: 0   omeprazole  (PRILOSEC) 20 MG capsule, Take 1 capsule (20 mg total) by mouth 2 (two) times daily., Disp: 60 capsule, Rfl: 0   sucralfate  (CARAFATE ) 1 g tablet, Take 1 tablet (1 g total) by mouth 3 (three) times daily with meals., Disp: 90 tablet, Rfl: 0   sucralfate  (CARAFATE ) 1 GM/10ML suspension, Take 10 mLs (1 g total) by mouth 3 (three) times daily with meals., Disp: 900 mL,  Rfl: 0   Review of Systems  Constitutional:  Negative for activity change, appetite change, chills, diaphoresis, fatigue, fever and unexpected weight change.  HENT:  Negative for congestion, rhinorrhea, sinus pressure, sneezing, sore throat and trouble swallowing.   Eyes:  Negative for photophobia and visual disturbance.  Respiratory:  Negative for cough, chest tightness, shortness of breath, wheezing and stridor.   Cardiovascular:  Negative for chest pain, palpitations and leg swelling.  Gastrointestinal:  Negative for abdominal distention, abdominal pain, anal bleeding, blood in stool, constipation, diarrhea, nausea and vomiting.  Genitourinary:  Negative for difficulty urinating, dysuria, flank pain and hematuria.  Musculoskeletal:  Negative for arthralgias, back pain, gait problem, joint swelling and myalgias.  Skin:  Negative for color change, pallor, rash and wound.  Neurological:  Negative for dizziness, tremors, weakness and light-headedness.  Hematological:  Negative for adenopathy. Does not bruise/bleed easily.  Psychiatric/Behavioral:  Negative for agitation, behavioral problems, confusion, decreased concentration, dysphoric mood and sleep disturbance.        Objective:   Physical Exam Constitutional:      Appearance: He is well-developed.  HENT:     Head: Normocephalic and atraumatic.  Eyes:      Conjunctiva/sclera: Conjunctivae normal.  Cardiovascular:     Rate and Rhythm: Normal rate and regular rhythm.  Pulmonary:     Effort: Pulmonary effort is normal. No respiratory distress.     Breath sounds: No wheezing.  Abdominal:     General: There is no distension.     Palpations: Abdomen is soft.  Musculoskeletal:        General: No tenderness. Normal range of motion.     Cervical back: Normal range of motion and neck supple.  Skin:    General: Skin is warm and dry.     Coloration: Skin is not pale.     Findings: No erythema or rash.  Neurological:     General: No focal deficit present.     Mental Status: He is alert and oriented to person, place, and time.  Psychiatric:        Mood and Affect: Mood normal.        Behavior: Behavior normal.        Thought Content: Thought content normal.        Judgment: Judgment normal.           Assessment & Plan:   Assessment and Plan    HIV infection Viral load at 170 copies/mL, indicating resaonable control. Discussed [facility] study participation post-rehab and potential future use of Cabenuva, considering adherence issues during relapses. - Continue Biktarvy . --check HIV RNA, CD4 routine labs   Opioid dependence with relapse Recent relapse with IV drug use, leading to right elbow abscess. Planning 10-week rehab at [facility]. Discussed adherence challenges during relapses impacting HIV management. - Attend 10-week rehab program at [facility]. - Coordinate with [facility] or [facility] for post-rehab support.  Abscess of right elbow: resolved  Suicidal ideation Recent ideation with specific plan. He went onto a tree and tied a noose around his neck but never jumped. He was on drugs and seemed to be having paranoid delusions. Discussed importance of seeking help if thoughts recur and he is contracting for safety. On Zyprexa  with adverse effects, interested in switching to Abilify. - Contact primary care  provider to discuss switching from Zyprexa  to Abilify. - Seek immediate help if suicidal thoughts recur, contracted for safety  STI risk and prevention Increased STI risk due to  promiscuous activity during drug use. Discussed STI prevention and screening, including HPV-related rectal cancer screening. - Consider HPV-related rectal cancer screening but he declined   Homelessness: connecting him to [facility]   Hyperlipidemia: continue lipitor Vaccine counseling: recommended and gave prevnar 20      HTN: continuing on his benicar 

## 2023-12-27 ENCOUNTER — Ambulatory Visit (INDEPENDENT_AMBULATORY_CARE_PROVIDER_SITE_OTHER): Admitting: Infectious Disease

## 2023-12-27 ENCOUNTER — Encounter: Payer: Self-pay | Admitting: Infectious Diseases

## 2023-12-27 ENCOUNTER — Encounter: Payer: Self-pay | Admitting: Infectious Disease

## 2023-12-27 ENCOUNTER — Other Ambulatory Visit (HOSPITAL_COMMUNITY): Payer: Self-pay

## 2023-12-27 ENCOUNTER — Other Ambulatory Visit: Payer: Self-pay

## 2023-12-27 ENCOUNTER — Other Ambulatory Visit (HOSPITAL_COMMUNITY)
Admission: RE | Admit: 2023-12-27 | Discharge: 2023-12-27 | Disposition: A | Discharge status: Home / Self Care | Source: Ambulatory Visit | Attending: Infectious Disease | Admitting: Infectious Disease

## 2023-12-27 VITALS — BP 126/87 | HR 80 | Temp 97.7°F | Ht 66.0 in | Wt 198.0 lb

## 2023-12-27 DIAGNOSIS — L02419 Cutaneous abscess of limb, unspecified: Secondary | ICD-10-CM

## 2023-12-27 DIAGNOSIS — E782 Mixed hyperlipidemia: Secondary | ICD-10-CM | POA: Diagnosis not present

## 2023-12-27 DIAGNOSIS — Z23 Encounter for immunization: Secondary | ICD-10-CM

## 2023-12-27 DIAGNOSIS — I10 Essential (primary) hypertension: Secondary | ICD-10-CM | POA: Diagnosis not present

## 2023-12-27 DIAGNOSIS — B20 Human immunodeficiency virus [HIV] disease: Secondary | ICD-10-CM | POA: Insufficient documentation

## 2023-12-27 DIAGNOSIS — F901 Attention-deficit hyperactivity disorder, predominantly hyperactive type: Secondary | ICD-10-CM

## 2023-12-27 DIAGNOSIS — R45851 Suicidal ideations: Secondary | ICD-10-CM

## 2023-12-27 DIAGNOSIS — F1990 Other psychoactive substance use, unspecified, uncomplicated: Secondary | ICD-10-CM

## 2023-12-27 MED ORDER — BICTEGRAVIR-EMTRICITAB-TENOFOV 50-200-25 MG PO TABS
1.0000 | ORAL_TABLET | Freq: Every day | ORAL | 11 refills | Status: AC
  Filled 2023-12-27 – 2024-01-13 (×2): qty 30, 30d supply, fill #0
  Filled 2024-02-10 – 2024-03-12 (×2): qty 30, 30d supply, fill #1
  Filled 2024-04-05 – 2024-04-09 (×2): qty 30, 30d supply, fill #2
  Filled 2024-05-03 – 2024-05-28 (×2): qty 30, 30d supply, fill #3
  Filled 2024-06-21: qty 30, 30d supply, fill #4
  Filled 2024-07-13: qty 30, 30d supply, fill #5

## 2023-12-27 NOTE — Patient Instructions (Addendum)
 Catrice with [facility] - [phone]   Mental Health Resources  988: can call or text 24/7  [facility]: Address: [address], [place], [place] [address] Open 24 hours Phone: [phone]  [facility] [place]: Address: [address], [place], [place] [address] Phone: [phone] Appointments: [email or web address]    Smoking Cessation: QuitlineNC 1-800-QUIT-NOW 636-660-2354); Espaol: 1-855-Djelo-Ya (1-623-010-6603) [email or web address]-[email or web address].[email or web address]

## 2023-12-28 LAB — T-HELPER CELLS (CD4) COUNT (NOT AT ARMC)
CD4 % Helper T Cell: 37 % (ref 33–65)
CD4 T Cell Abs: 947 /uL (ref 400–1790)

## 2023-12-28 LAB — URINE CYTOLOGY ANCILLARY ONLY
Chlamydia: NEGATIVE
Comment: NEGATIVE
Comment: NORMAL
Neisseria Gonorrhea: NEGATIVE

## 2023-12-29 ENCOUNTER — Ambulatory Visit: Payer: Self-pay

## 2023-12-29 LAB — COMPLETE METABOLIC PANEL WITHOUT GFR
AG Ratio: 1.6 (calc) (ref 1.0–2.5)
ALT: 67 U/L — ABNORMAL HIGH (ref 9–46)
AST: 34 U/L (ref 10–40)
Albumin: 4.5 g/dL (ref 3.6–5.1)
Alkaline phosphatase (APISO): 79 U/L (ref 36–130)
BUN: 13 mg/dL (ref 7–25)
CO2: 26 mmol/L (ref 20–32)
Calcium: 9.7 mg/dL (ref 8.6–10.3)
Chloride: 102 mmol/L (ref 98–110)
Creat: 0.95 mg/dL (ref 0.60–1.29)
Globulin: 2.9 g/dL (ref 1.9–3.7)
Glucose, Bld: 91 mg/dL (ref 65–99)
Potassium: 4.7 mmol/L (ref 3.5–5.3)
Sodium: 136 mmol/L (ref 135–146)
Total Bilirubin: 0.4 mg/dL (ref 0.2–1.2)
Total Protein: 7.4 g/dL (ref 6.1–8.1)

## 2023-12-29 LAB — LIPID PANEL
Cholesterol: 212 mg/dL — ABNORMAL HIGH (ref <200)
HDL: 51 mg/dL (ref >=40)
LDL Cholesterol (Calc): 116 mg/dL — ABNORMAL HIGH
Non-HDL Cholesterol (Calc): 161 mg/dL — ABNORMAL HIGH (ref <130)
Total CHOL/HDL Ratio: 4.2 (calc) (ref <5.0)
Triglycerides: 291 mg/dL — ABNORMAL HIGH (ref <150)

## 2023-12-29 LAB — CBC WITH DIFFERENTIAL/PLATELET
Absolute Lymphocytes: 2861 {cells}/uL (ref 850–3900)
Absolute Monocytes: 864 {cells}/uL (ref 200–950)
Basophils Absolute: 86 {cells}/uL (ref 0–200)
Basophils Relative: 0.9 %
Eosinophils Absolute: 202 {cells}/uL (ref 15–500)
Eosinophils Relative: 2.1 %
HCT: 43.6 % (ref 38.5–50.0)
Hemoglobin: 14.4 g/dL (ref 13.2–17.1)
MCH: 30.7 pg (ref 27.0–33.0)
MCHC: 33 g/dL (ref 32.0–36.0)
MCV: 93 fL (ref 80.0–100.0)
MPV: 8.6 fL (ref 7.5–12.5)
Monocytes Relative: 9 %
Neutro Abs: 5587 {cells}/uL (ref 1500–7800)
Neutrophils Relative %: 58.2 %
Platelets: 332 Thousand/uL (ref 140–400)
RBC: 4.69 Million/uL (ref 4.20–5.80)
RDW: 13.8 % (ref 11.0–15.0)
Total Lymphocyte: 29.8 %
WBC: 9.6 Thousand/uL (ref 3.8–10.8)

## 2023-12-29 LAB — HIV-1 RNA QUANT-NO REFLEX-BLD
HIV 1 RNA Quant: 50 {copies}/mL — ABNORMAL HIGH
HIV-1 RNA Quant, Log: 1.7 {Log_copies}/mL — ABNORMAL HIGH

## 2023-12-29 LAB — RPR: RPR Ser Ql: NONREACTIVE

## 2023-12-29 NOTE — Telephone Encounter (Signed)
 FYI Only or Action Required?: FYI only for provider.  Patient was last seen in primary care on 12/06/2023 by Jolaine Pac, DO.  Called Nurse Triage reporting Recurrent Skin Infections.  Symptoms began a week ago.  Interventions attempted: OTC medications: Anti-Itch Spray.  Symptoms are: gradually worsening.  Triage Disposition: See Physician Within 24 Hours  Patient/caregiver understands and will follow disposition?: Yes  **Appt scheduled for 7/18**                                 Copied from CRM #[id number]. Topic: Clinical - Red Word Triage >> Dec 29, 2023  1:18 PM Zane F wrote: [facility] that prompted transfer to Nurse Triage:   Severe jock itch Reason for Disposition  Rash is painful to touch  Answer Assessment - Initial Assessment Questions 1. APPEARANCE of RASH: What does the rash look like?      He reports jock itch between the skin folds in the legs  2. LOCATION: Where is the rash located?      Groin/ inner thighs  3. ONSET: When did the rash start?      X 1 week   4. ITCHING: Does the rash itch? If Yes, ask: How bad is the itch?  (Scale 1-10; or mild, moderate, severe)    Moderate  5. PAIN: Does the rash hurt? If Yes, ask: How bad is the pain?  (Scale 1-10; or mild, moderate, severe)     4/10   6. OTHER SYMPTOMS: Do you have any other symptoms? (e.g., fever)     No   Using jock itch spray which stings.  Protocols used: Jock Itch-A-AH

## 2023-12-30 ENCOUNTER — Other Ambulatory Visit (HOSPITAL_COMMUNITY): Payer: Self-pay

## 2023-12-30 ENCOUNTER — Encounter: Payer: Self-pay | Admitting: Student

## 2023-12-30 ENCOUNTER — Ambulatory Visit: Payer: Self-pay | Admitting: Student

## 2023-12-30 ENCOUNTER — Other Ambulatory Visit: Payer: Self-pay

## 2023-12-30 VITALS — BP 116/74 | HR 82 | Temp 98.1°F | Ht 66.0 in | Wt 196.4 lb

## 2023-12-30 DIAGNOSIS — B356 Tinea cruris: Secondary | ICD-10-CM

## 2023-12-30 DIAGNOSIS — B20 Human immunodeficiency virus [HIV] disease: Secondary | ICD-10-CM | POA: Diagnosis not present

## 2023-12-30 MED ORDER — CLOTRIMAZOLE 1 % EX CREA
1.0000 | TOPICAL_CREAM | Freq: Two times a day (BID) | CUTANEOUS | 0 refills | Status: DC
  Filled 2023-12-30: qty 60, 30d supply, fill #0

## 2023-12-30 NOTE — Telephone Encounter (Signed)
 Spoke with pt about his rash. He was seen in IMTS clinic this am.  Wished him luck at his treatment program upcoming.

## 2023-12-30 NOTE — Assessment & Plan Note (Addendum)
 Jimmy Fisher is a 43 year old male with a history notable for homelessness and HIV, who presented to the office with a 2-week history of severe pruritus localized to the groin area. He reports using over-the-counter moisturizers without relief.The patient denies a prior history of tinea infections; however, a review of his dispensary records indicates that Dr. Eben prescribed terbinafine  in 2023 for onychomycosis. On physical examination, there is an asymmetrical, circular patch with overlying scale in the inguinal region, sparing the scrotum--findings consistent with tinea cruris. A topical antifungal will be prescribed for use twice daily over the next 2 to 3 weeks. The importance of personal hygiene, including keeping the area clean and dry, was emphasized as an adjunctive measure in the management of his current symptoms. -  Clotrimazole 1% cream

## 2023-12-30 NOTE — Patient Instructions (Addendum)
 Thank you, [name] for allowing us  to provide your care today. Today we discussed  your itching.  I will be prescribing the medication below. Please use the medication as discussed Please continue follow-up with Dr. Eben as needed  I have ordered the following labs for you:  Lab Orders  No laboratory test(s) ordered today     Tests ordered today:    Referrals ordered today:   Referral Orders  No referral(s) requested today     I have ordered the following medication/changed the following medications:   Stop the following medications: There are no discontinued medications.   Start the following medications: Meds ordered this encounter  Medications   clotrimazole (CLOTRIMAZOLE ANTI-FUNGAL) 1 % cream    Sig: Apply 1 Application topically 2 (two) times daily.    Dispense:  60 g    Refill:  0     Follow up: 4 months   Remember: To keep the area clean dry   Should you have any questions or concerns please call the internal medicine clinic at [phone].   Drue Lisa Grow MD 12/30/2023, 11:44 AM   [facility]

## 2023-12-30 NOTE — Assessment & Plan Note (Signed)
 Patient follows up with the ID clinic.  I appreciate Dr. Loa collaboration in the care of this patient.  No active concerns at this time -  Biktarvy  daily

## 2023-12-30 NOTE — Progress Notes (Signed)
 CC: Itching   HPI:  [name] is a 43 y.o. male living with a history stated below and presents today for itching. Please see problem based assessment and plan for additional details.  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 30 tablet 0   atorvastatin  (LIPITOR) 10 MG tablet Take 1 tablet (10 mg total) by mouth daily. (Patient not taking: Reported on 12/27/2023) 30 tablet 0   bictegravir-emtricitabine -tenofovir  AF (BIKTARVY ) 50-200-25 MG TABS tablet Take 1 tablet by mouth daily. 30 tablet 0   bictegravir-emtricitabine -tenofovir  AF (BIKTARVY ) 50-200-25 MG TABS tablet Take 1 tablet by mouth daily. 30 tablet 11   FLUoxetine  (PROZAC ) 40 MG capsule Take 1 capsule (40 mg total) by mouth daily. 30 capsule 0   olmesartan  (BENICAR ) 20 MG tablet Take 20 mg by mouth daily.     omeprazole  (PRILOSEC) 20 MG capsule Take 1 capsule (20 mg total) by mouth 2 (two) times daily. 60 capsule 0   sucralfate  (CARAFATE ) 1 g tablet Take 1 tablet (1 g total) by mouth 3 (three) times daily with meals. 90 tablet 0   No current facility-administered medications on file prior to visit.    Family History  Problem Relation Age of Onset   Fibromyalgia Mother    Cancer Father     Social History   Socioeconomic History   Marital  status: Married    Spouse name: Not on file   Number of children: 2   Years of education: Not on file   Highest education level: Not on file  Occupational History   Not on file  Tobacco Use   Smoking status: Every Day    Types: E-cigarettes    Passive exposure: Past   Smokeless tobacco: Former    Types: Snuff  Vaping Use   Vaping status: Every Day   Substances: Nicotine   Substance and Sexual Activity   Alcohol use: Not Currently    Alcohol/week: 30.0 standard drinks of alcohol    Types: 30 Standard drinks or equivalent per week    Comment: 1/5 a day reported by patient   Drug use: Not Currently    Types: Cocaine, Methamphetamines, Marijuana   Sexual activity: Yes  Other Topics Concern   Not on file  Social History Narrative   Right Handed    Has two sons    Lives in a one story home    Social Drivers of Health   Financial Resource Strain: Not on file  Food Insecurity: Food Insecurity Present (12/16/2023)   Hunger Vital Sign    Worried About Running Out of Food in the Last Year: Sometimes true    Ran Out of Food in the Last Year: Sometimes true  Transportation Needs: No Transportation Needs (12/16/2023)  PRAPARE - Administrator, Civil Service (Medical): No    Lack of Transportation (Non-Medical): No  Physical Activity: Not on file  Stress: Not on file  Social Connections: Unknown (11/18/2023)   Social Connection and Isolation Panel    Frequency of Communication with Friends and Family: Not on file    Frequency of Social Gatherings with Friends and Family: Not on file    Attends Religious Services: Not on file    Active Member of Clubs or Organizations: Not on file    Attends Banker Meetings: Not on file    Marital Status: Divorced  Intimate Partner Violence: Not At Risk (12/16/2023)   Humiliation, Afraid, Rape, and Kick questionnaire    Fear of Current or Ex-Partner: No    Emotionally Abused: No    Physically Abused: No    Sexually Abused: No     Review of Systems: ROS negative except for what is noted on the assessment and plan.  Vitals:   12/30/23 0841  BP: 116/74  Pulse: 82  Temp: 98.1 F (36.7 C)  TempSrc: Oral  SpO2: 98%  Weight: 196 lb 6.4 oz (89.1 kg)  Height: 5' 6 (1.676 m)    Physical Exam: Constitutional: well-appearing man  sitting in chair , in no acute distress Cardiovascular: regular rate and rhythm Pulmonary/Chest: normal work of breathing on room air MSK: An asymmetrical, well-demarcated circular lesion with overlying scale noted in the inguinal region, sparing the scrotum Skin: warm and dry Psych: normal mood and behavior  Assessment & Plan:   Tinea cruris Mr. Jimmy Fisher is a 42 year old male with a history notable for homelessness and HIV, who presented to the office with a 2-week history of severe pruritus localized to the groin area. He reports using over-the-counter moisturizers without relief.The patient denies a prior history of tinea infections; however, a review of his dispensary records indicates that Dr. Eben prescribed terbinafine  in 2023 for onychomycosis. On physical examination, there is an asymmetrical, circular patch with overlying scale in the inguinal region, sparing the scrotum--findings consistent with tinea cruris. A topical antifungal will be prescribed for use twice daily over the next 2 to 3 weeks. The importance of personal hygiene, including keeping the area clean and dry, was emphasized as an adjunctive measure in the management of his current symptoms. -  Clotrimazole 1% cream  HIV disease (HCC) Patient follows up with the ID clinic.  I appreciate Dr. Loa collaboration in the care of this patient.  No active concerns at this time -  Biktarvy  daily     Patient discussed with Dr. Karna Drue Grow, M.D [facility] Phone: [phone] Date 12/30/2023 Time 11:41 AM

## 2024-01-02 ENCOUNTER — Encounter: Payer: Self-pay | Admitting: Infectious Diseases

## 2024-01-02 ENCOUNTER — Ambulatory Visit: Admitting: Infectious Disease

## 2024-01-02 NOTE — Addendum Note (Signed)
 Addended by: KARNA FELLOWS on: 01/02/2024 02:14 PM   Modules accepted: Level of Service

## 2024-01-02 NOTE — Progress Notes (Signed)
 Internal Medicine Clinic Attending  Case discussed with the resident at the time of the visit.  We reviewed the resident's history and exam and pertinent patient test results.  I agree with the assessment, diagnosis, and plan of care documented in the resident's note.

## 2024-01-13 ENCOUNTER — Other Ambulatory Visit: Payer: Self-pay

## 2024-01-13 ENCOUNTER — Other Ambulatory Visit (HOSPITAL_COMMUNITY): Payer: Self-pay

## 2024-01-13 ENCOUNTER — Other Ambulatory Visit: Payer: Self-pay | Admitting: Pharmacy Technician

## 2024-01-13 NOTE — Progress Notes (Signed)
 Specialty Pharmacy Refill Coordination Note  Jimmy Fisher is a 43 y.o. male contacted today regarding refills of specialty medication(s) Bictegravir-Emtricitab-Tenofov (BIKTARVY )  Spoke with Mom  Patient requested Delivery   Delivery date: 01/17/24   Verified address: [address]  [place]   Medication will be filled on 01/16/24.

## 2024-01-14 ENCOUNTER — Other Ambulatory Visit (HOSPITAL_COMMUNITY): Payer: Self-pay

## 2024-01-16 ENCOUNTER — Other Ambulatory Visit: Payer: Self-pay

## 2024-01-31 NOTE — Progress Notes (Unsigned)
 Ellouise Console, PA-C [address] [place], [place]  [address] Phone: [phone]   Primary Care Physician: Eben Reyes BROCKS, MD  Primary Gastroenterologist:  Ellouise Console, PA-C / Glendia Holt, MD   Chief Complaint: Follow-up GERD, abdominal pain, discuss colonoscopy   HPI:   Jimmy Fisher is a 43 y.o. male, established patient of Dr. Holt, presents for follow-up of acid reflux.  Currently taking omeprazole  20 Mg twice daily and Carafate  1 g 3 times daily as needed.    Current symptoms: He has had 4 episodes of acute abdominal pain in the past 8 months.  Abdominal pain has been located on the epigastrium and left upper quadrant.  Feels like someone punched him in the abdomen.  Pain comes and goes.  Pain is relieved after taking Carafate .  Denies nausea, vomiting, heartburn, or rectal bleeding.  He has episodes of diarrhea alternating with constipation.  He was told he may have IBS in the past.  He has no current abdominal pain today.  Currently he is having normal bowel movement daily for the past 1 or 2 weeks and is feeling better.  He last saw Dr. Holt 07/2021.  Remote history of peptic ulcer disease, H. pylori, and HIV.  Previously took [employer] powders.  Negative H. pylori stool test 08/2021.  He was started on PPI and Carafate  and advised to stop BCs.  No previous EGD.  He is age 70 and is due for first screening colonoscopy.  He denies family history of colon cancer or GI malignancies.  11/19/2023 echo normal LVEF 55 to 60%.  Current Outpatient Medications  Medication Sig Dispense Refill   atorvastatin  (LIPITOR) 10 MG tablet Take 1 tablet (10 mg total) by mouth daily. 30 tablet 0   bictegravir-emtricitabine -tenofovir  AF (BIKTARVY ) 50-200-25 MG TABS tablet Take 1 tablet by mouth daily. 30 tablet 11   clotrimazole  (CLOTRIMAZOLE  ANTI-FUNGAL) 1 % cream Apply 1 Application topically 2 (two) times daily. 60 g 0   FLUoxetine  (PROZAC ) 40 MG capsule Take 1 capsule (40  mg total) by mouth daily. 30 capsule 0   Na Sulfate-K Sulfate-Mg Sulfate concentrate (SUPREP) 17.5-3.13-1.6 GM/177ML SOLN Take 1 kit (354 mLs total) by mouth once for 1 dose. 354 mL 0   olmesartan  (BENICAR ) 20 MG tablet Take 20 mg by mouth daily.     omeprazole  (PRILOSEC) 20 MG capsule Take 1 capsule (20 mg total) by mouth 2 (two) times daily. 60 capsule 0   sucralfate  (CARAFATE ) 1 g tablet Take 1 tablet (1 g total) by mouth 3 (three) times daily with meals. 90 tablet 0   No current facility-administered medications for this visit.    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   Neuromuscular disorder (HCC)    fibromyalgia   Pulmonary embolism (HCC)    2012   SLAP tear of shoulder    left   Urticarial rash 12/05/2022   Vaccine counseling 05/09/2023    Past Surgical History:  Procedure Laterality Date   COLONOSCOPY  I & D EXTREMITY Left 07/16/2020   Procedure: IRRIGATION AND DEBRIDEMENT FOREARM;  Surgeon: Shari Easter, MD;  Location: [facility] OR;  Service: Orthopedics;  Laterality: Left;   IRRIGATION AND DEBRIDEMENT ELBOW Right 11/21/2023   Procedure: IRRIGATION AND DEBRIDEMENT ELBOW;  Surgeon: Beuford Anes, MD;  Location: [facility] ORS;  Service: Orthopedics;  Laterality: Right;   SHOULDER ARTHROSCOPY WITH BICEPS TENDON REPAIR Left 10/22/2020   Procedure: LEFT SHOULDER ARTHROSCOPY, BICEPS TENODESIS;  Surgeon: Jerri Kay HERO, MD;  Location: [facility];  Service: Orthopedics;  Laterality: Left;   SUBACROMIAL DECOMPRESSION Left 10/22/2020   Procedure:  SUBACROMIAL DECOMPRESSION;  Surgeon: Jerri Kay HERO, MD;  Location: [facility];  Service: Orthopedics;  Laterality: Left;   UPPER GI ENDOSCOPY      Review of Systems:    All systems reviewed and negative except where noted in HPI.    Physical Exam:  BP 104/62 (BP Location: Right Arm, Patient Position: Sitting, Cuff Size: Normal)   Pulse 92   Ht 5' 6 (1.676 m)   Wt 198 lb 2 oz (89.9 kg)   BMI 31.98 kg/m  No LMP for male patient.  General: Well-nourished, well-developed in no acute distress.  Lungs: Clear to auscultation bilaterally. Non-labored. Heart: Regular rate and rhythm, no murmurs rubs or gallops.  Abdomen: Bowel sounds are normal; Abdomen is Soft; No hepatosplenomegaly, masses or hernias;  No Abdominal Tenderness; No guarding or rebound tenderness. Neuro: Alert and oriented x 3.  Grossly intact.  Psych: Alert and cooperative, normal mood and affect.   Imaging Studies: No results found.  Labs: CBC    Component Value Date/Time   WBC 9.6 12/27/2023 1002   RBC 4.69 12/27/2023 1002   HGB 14.4 12/27/2023 1002   HGB 14.2 12/06/2023 1553   HCT 43.6 12/27/2023 1002   HCT 43.6 12/06/2023 1553   PLT 332 12/27/2023 1002   PLT 290 12/06/2023 1553   MCV 93.0 12/27/2023 1002   MCV 94 12/06/2023 1553   MCH 30.7 12/27/2023 1002   MCHC 33.0 12/27/2023 1002   RDW 13.8 12/27/2023 1002   RDW 14.1 12/06/2023 1553   LYMPHSABS 3.5 12/15/2023 1716   LYMPHSABS 2.9 12/06/2023 1553   MONOABS 1.4 (H) 12/15/2023 1716   EOSABS 202 12/27/2023 1002   EOSABS 0.4 12/06/2023 1553   BASOSABS 86 12/27/2023 1002   BASOSABS 0.1 12/06/2023 1553    CMP     Component Value Date/Time   NA 136 12/27/2023 1002   NA 139 12/06/2023 1553   K 4.7 12/27/2023 1002   CL 102 12/27/2023 1002   CO2 26 12/27/2023 1002   GLUCOSE 91 12/27/2023 1002   BUN 13 12/27/2023 1002   BUN 10 12/06/2023 1553   CREATININE 0.95 12/27/2023 1002   CALCIUM  9.7 12/27/2023 1002   PROT 7.4 12/27/2023  1002   PROT 7.0 12/06/2023 1553   ALBUMIN 4.8 12/17/2023 0715   ALBUMIN 4.5 12/06/2023 1553   AST 34 12/27/2023 1002   ALT 67 (H) 12/27/2023 1002   ALKPHOS 117 12/17/2023 0715   BILITOT 0.4 12/27/2023 1002   BILITOT 0.4 12/06/2023 1553   GFRNONAA >60 12/17/2023 0715   GFRAA >60 10/13/2019 0132     Assessment and Plan:   BLASE BECKNER is a 43 y.o. y/o male presents for:  1.  Intermittent episodes of epigastric and LUQ pain.  Evaluate for recurrent peptic ulcer. - Scheduling EGD I discussed risks of EGD with patient to include risk of bleeding, perforation, and  risk of sedation.  Patient expressed understanding and agrees to proceed with EGD.   2.  Generalized abdominal pain with irregular bowel habits; suspicious for IBS; currently improved.  3..  Chronic GERD; history of peptic ulcer disease - Continue PPI and Carafate  as needed. - Avoid NSAIDs and Goody powders or BC powders.  4.  Remote history of H. pylori.  Last H. pylori stool test 08/2021 was negative confirming eradication.  5.  Colon cancer screening: Currently due for first screening colonoscopy. - Scheduling Colonoscopy I discussed risks of colonoscopy with patient to include risk of bleeding, colon perforation, and risk of sedation.  Patient expressed understanding and agrees to proceed with colonoscopy.   6.  Comorbidities: HIV, alcohol use disorder, opioid dependence, fibromyalgia, history of PE, history of IV drug use, MDD, history of polysubstance abuse.  Appears stable at present.  No current blood thinners.  Ellouise Console, PA-C  Follow up as needed based on EGD, colonoscopy, and GI symptoms.

## 2024-02-01 ENCOUNTER — Encounter: Payer: Self-pay | Admitting: Physician Assistant

## 2024-02-01 ENCOUNTER — Ambulatory Visit (INDEPENDENT_AMBULATORY_CARE_PROVIDER_SITE_OTHER): Payer: MEDICAID | Admitting: Physician Assistant

## 2024-02-01 ENCOUNTER — Other Ambulatory Visit (HOSPITAL_COMMUNITY): Payer: Self-pay

## 2024-02-01 VITALS — BP 104/62 | HR 92 | Ht 66.0 in | Wt 198.1 lb

## 2024-02-01 DIAGNOSIS — R1012 Left upper quadrant pain: Secondary | ICD-10-CM

## 2024-02-01 DIAGNOSIS — R1013 Epigastric pain: Secondary | ICD-10-CM | POA: Diagnosis not present

## 2024-02-01 DIAGNOSIS — K582 Mixed irritable bowel syndrome: Secondary | ICD-10-CM

## 2024-02-01 DIAGNOSIS — Z1211 Encounter for screening for malignant neoplasm of colon: Secondary | ICD-10-CM

## 2024-02-01 DIAGNOSIS — Z8619 Personal history of other infectious and parasitic diseases: Secondary | ICD-10-CM

## 2024-02-01 DIAGNOSIS — K219 Gastro-esophageal reflux disease without esophagitis: Secondary | ICD-10-CM | POA: Diagnosis not present

## 2024-02-01 DIAGNOSIS — Z8711 Personal history of peptic ulcer disease: Secondary | ICD-10-CM

## 2024-02-01 DIAGNOSIS — R194 Change in bowel habit: Secondary | ICD-10-CM | POA: Diagnosis not present

## 2024-02-01 MED ORDER — NA SULFATE-K SULFATE-MG SULF 17.5-3.13-1.6 GM/177ML PO SOLN
1.0000 | Freq: Once | ORAL | 0 refills | Status: AC
  Filled 2024-02-01: qty 354, 1d supply, fill #0

## 2024-02-01 NOTE — Patient Instructions (Signed)
 Please follow up sooner if symptoms increase or worsen  Due to recent changes in healthcare laws, you may see the results of your imaging and laboratory studies on MyChart before your provider has had a chance to review them.  We understand that in some cases there may be results that are confusing or concerning to you. Not all laboratory results come back in the same time frame and the provider may be waiting for multiple results in order to interpret others.  Please give us  48 hours in order for your provider to thoroughly review all the results before contacting the office for clarification of your results.   Thank you for trusting me with your gastrointestinal care!   Ellouise Console, PA-C _______________________________________________________  If your blood pressure at your visit was 140/90 or greater, please contact your primary care physician to follow up on this.  _______________________________________________________  If you are age 62 or older, your body mass index should be between 23-30. Your Body mass index is 31.98 kg/m. If this is out of the aforementioned range listed, please consider follow up with your Primary Care Provider.  If you are age 29 or younger, your body mass index should be between 19-25. Your Body mass index is 31.98 kg/m. If this is out of the aformentioned range listed, please consider follow up with your Primary Care Provider.   ________________________________________________________  The [facility] providers would like to encourage you to use MYCHART to communicate with providers for non-urgent requests or questions.  Due to long hold times on the telephone, sending your provider a message by [facility] may be a faster and more efficient way to get a response.  Please allow 48 business hours for a response.  Please remember that this is for non-urgent requests.  _______________________________________________________

## 2024-02-02 ENCOUNTER — Other Ambulatory Visit: Payer: Self-pay

## 2024-02-03 NOTE — Progress Notes (Signed)
 Agree with the assessment and plan as outlined by Brigitte Canard, PA-C.

## 2024-02-08 ENCOUNTER — Other Ambulatory Visit (HOSPITAL_COMMUNITY): Payer: Self-pay

## 2024-02-10 ENCOUNTER — Other Ambulatory Visit: Payer: Self-pay

## 2024-02-14 ENCOUNTER — Other Ambulatory Visit (HOSPITAL_COMMUNITY): Payer: Self-pay

## 2024-02-17 ENCOUNTER — Other Ambulatory Visit: Payer: Self-pay | Admitting: Infectious Diseases

## 2024-02-17 ENCOUNTER — Other Ambulatory Visit (HOSPITAL_COMMUNITY): Payer: Self-pay

## 2024-02-20 ENCOUNTER — Other Ambulatory Visit: Payer: Self-pay

## 2024-02-20 ENCOUNTER — Other Ambulatory Visit (HOSPITAL_COMMUNITY): Payer: Self-pay

## 2024-02-20 MED ORDER — SUCRALFATE 1 G PO TABS
1.0000 g | ORAL_TABLET | Freq: Three times a day (TID) | ORAL | 0 refills | Status: AC
  Filled 2024-07-04: qty 90, 30d supply, fill #0

## 2024-02-20 MED ORDER — FLUOXETINE HCL 40 MG PO CAPS
40.0000 mg | ORAL_CAPSULE | Freq: Every day | ORAL | 0 refills | Status: DC
  Filled 2024-02-20: qty 30, 30d supply, fill #0

## 2024-02-20 NOTE — Telephone Encounter (Signed)
 Medication not prescribed by imc provider.

## 2024-02-22 ENCOUNTER — Other Ambulatory Visit (HOSPITAL_COMMUNITY): Payer: Self-pay

## 2024-02-23 ENCOUNTER — Other Ambulatory Visit (HOSPITAL_COMMUNITY): Payer: Self-pay

## 2024-02-25 ENCOUNTER — Encounter (HOSPITAL_COMMUNITY): Payer: Self-pay

## 2024-02-25 ENCOUNTER — Emergency Department (HOSPITAL_COMMUNITY)
Admission: EM | Admit: 2024-02-25 | Discharge: 2024-02-27 | Disposition: A | Discharge status: Other Acute Inpatient Hospital | Payer: MEDICAID | Attending: Emergency Medicine | Admitting: Emergency Medicine

## 2024-02-25 ENCOUNTER — Other Ambulatory Visit: Payer: Self-pay

## 2024-02-25 DIAGNOSIS — Z21 Asymptomatic human immunodeficiency virus [HIV] infection status: Secondary | ICD-10-CM | POA: Diagnosis not present

## 2024-02-25 DIAGNOSIS — I10 Essential (primary) hypertension: Secondary | ICD-10-CM | POA: Diagnosis not present

## 2024-02-25 DIAGNOSIS — F419 Anxiety disorder, unspecified: Secondary | ICD-10-CM | POA: Insufficient documentation

## 2024-02-25 DIAGNOSIS — X838XXA Intentional self-harm by other specified means, initial encounter: Secondary | ICD-10-CM | POA: Insufficient documentation

## 2024-02-25 DIAGNOSIS — F1510 Other stimulant abuse, uncomplicated: Secondary | ICD-10-CM

## 2024-02-25 DIAGNOSIS — F22 Delusional disorders: Secondary | ICD-10-CM | POA: Diagnosis not present

## 2024-02-25 DIAGNOSIS — R7401 Elevation of levels of liver transaminase levels: Secondary | ICD-10-CM | POA: Diagnosis not present

## 2024-02-25 DIAGNOSIS — F15150 Other stimulant abuse with stimulant-induced psychotic disorder with delusions: Secondary | ICD-10-CM | POA: Diagnosis not present

## 2024-02-25 DIAGNOSIS — T1491XA Suicide attempt, initial encounter: Secondary | ICD-10-CM

## 2024-02-25 DIAGNOSIS — F29 Unspecified psychosis not due to a substance or known physiological condition: Secondary | ICD-10-CM | POA: Diagnosis not present

## 2024-02-25 DIAGNOSIS — F19959 Other psychoactive substance use, unspecified with psychoactive substance-induced psychotic disorder, unspecified: Secondary | ICD-10-CM | POA: Diagnosis present

## 2024-02-25 DIAGNOSIS — D72829 Elevated white blood cell count, unspecified: Secondary | ICD-10-CM

## 2024-02-25 NOTE — ED Provider Notes (Signed)
 [facility] DE[facility] AT [facility] Provider Note   CSN: [id number] Arrival date & time: 02/25/24  2249     Patient presents with: Suicide Attempt   Jimmy Fisher is a 43 y.o. male.   43 yo male brought in by [facility] for suicide attempt, IVC by [employer]. Patient felt like there were 6 people chasing him today because of his HIV status, [place]. Police arrived, [place], drug paraphernalia was found and he was taken to jail. The plan was for patient to then be taken to [facility] for help with substance abuse and paranoia when he pulled the drawstring from his pants and wrapped it around his neck. This was witnessed by staff and removed, patient did not loose consciousness. He denies any complaints other than as stated above. Last td less than 5 years ago.        Prior to Admission medications   Medication Sig Start Date End Date Taking? Authorizing Provider  atorvastatin  (LIPITOR) 10 MG tablet Take 1 tablet (10 mg total) by mouth daily. 12/20/23   Jimmy Barter, MD  bictegravir-emtricitabine -tenofovir  AF (BIKTARVY ) 50-200-25 MG TABS tablet Take 1 tablet by mouth daily. 12/27/23   Jimmy Kathie Jomarie LOISE, MD  clotrimazole  (CLOTRIMAZOLE  ANTI-FUNGAL) 1 % cream Apply 1 Application topically 2 (two) times daily. 12/30/23   Amoako, Prince, MD  FLUoxetine  (PROZAC ) 40 MG capsule Take 1 capsule (40 mg total) by mouth daily. 12/20/23 02/01/24  Jimmy Barter, MD  FLUoxetine  (PROZAC ) 40 MG capsule Take 1 capsule (40 mg total) by mouth daily. 02/20/24   Jimmy Reyes BROCKS, MD  olmesartan  (BENICAR ) 20 MG tablet Take 20 mg by mouth daily.    [provider]  omeprazole  (PRILOSEC) 20 MG capsule Take 1 capsule (20 mg total) by mouth 2 (two) times daily. 12/20/23   Jimmy Barter, MD  sucralfate  (CARAFATE ) 1 g tablet Take 1 tablet (1 g total) by mouth 3 (three) times daily with meals.  02/20/24   Jimmy Reyes BROCKS, MD    Allergies: Doxycycline     Review of Systems Level 5 caveat for psychiatric condition  Updated Vital Signs BP 110/79 (BP Location: Left Arm)   Pulse (!) 101   Temp 98.2 F (36.8 C) (Axillary)   Resp 14   Ht 5' 6 (1.676 m)   Wt 90.7 kg   SpO2 95%   BMI 32.28 kg/m   Physical Exam Vitals and nursing note reviewed.  Constitutional:      General: He is not in acute distress.    Appearance: He is well-developed. He is not diaphoretic.  HENT:     Head: Normocephalic and atraumatic.  Cardiovascular:     Rate and Rhythm: Normal rate and regular rhythm.     Heart sounds: Normal heart sounds.  Pulmonary:     Effort: Pulmonary effort is normal.     Breath sounds: Normal breath sounds.  Abdominal:     Palpations: Abdomen is soft.     Tenderness: There is no abdominal tenderness.  Musculoskeletal:     Cervical back: Neck supple. No tenderness.     Right lower leg: No edema.     Left lower leg: No edema.  Skin:    General: Skin is warm and dry.     Findings: No bruising or erythema.     Comments: Superficial abrasions to  bilateral lower extremities   Neurological:     Mental Status: He is alert and oriented to person, place, and time.  Psychiatric:        Mood and Affect: Mood is anxious.        Behavior: Behavior is agitated and hyperactive.        Thought Content: Thought content is paranoid. Thought content includes suicidal ideation. Thought content does not include homicidal ideation. Thought content includes suicidal plan.     (all labs ordered are listed, but only abnormal results are displayed) Labs Reviewed  COMPREHENSIVE METABOLIC PANEL WITH GFR - Abnormal; Notable for the following components:      Result Value   CO2 15 (*)    BUN 28 (*)    Creatinine, Ser 1.46 (*)    AST 324 (*)    ALT 101 (*)    Total Bilirubin 3.3 (*)    Anion gap 22 (*)    All other components within normal limits  RAPID URINE DRUG SCREEN, HOSP  PERFORMED - Abnormal; Notable for the following components:   Amphetamines POSITIVE (*)    All other components within normal limits  CBC WITH DIFFERENTIAL/PLATELET - Abnormal; Notable for the following components:   WBC 20.5 (*)    Neutro Abs 16.0 (*)    Monocytes Absolute 2.1 (*)    Abs Immature Granulocytes 0.14 (*)    All other components within normal limits  ACETAMINOPHEN  LEVEL - Abnormal; Notable for the following components:   Acetaminophen  (Tylenol ), Serum <10 (*)    All other components within normal limits  SALICYLATE LEVEL - Abnormal; Notable for the following components:   Salicylate Lvl <7.0 (*)    All other components within normal limits  I-STAT CHEM 8, ED - Abnormal; Notable for the following components:   BUN 28 (*)    Creatinine, Ser 1.40 (*)    Calcium , Ion 1.06 (*)    TCO2 15 (*)    All other components within normal limits  CULTURE, BLOOD (ROUTINE X 2)  CULTURE, BLOOD (ROUTINE X 2)  ETHANOL  HEPATITIS PANEL, ACUTE    EKG: EKG Interpretation Date/Time:  Saturday February 25 2024 23:46:19 EDT Ventricular Rate:  126 PR Interval:  138 QRS Duration:  94 QT Interval:  324 QTC Calculation: 469 R Axis:   -32  Text Interpretation: Sinus tachycardia Left axis deviation Incomplete right bundle branch block Septal infarct , age undetermined Confirmed by Jimmy Fisher [phone]) on 02/26/2024 5:15:25 AM  Radiology: No results found.   Procedures   Medications Ordered in the ED  ziprasidone  (GEODON ) injection 20 mg (20 mg Intramuscular Given 02/26/24 0247)  sodium chloride  0.9 % bolus 1,000 mL (1,000 mLs Intravenous New Bag/Given 02/26/24 0446)                                    Medical Decision Making Amount and/or Complexity of Data Reviewed Labs: ordered. Radiology: ordered.  Risk Prescription drug management.   This patient presents to the ED for concern of SI, this involves an extensive number of treatment options, and is a complaint that carries  with it a high risk of complications and morbidity.  The differential diagnosis includes but not limited to psychosis, substance abuse, depression   Co morbidities / Chronic conditions that complicate the patient evaluation  GERD, ADHD, polysubstance abuse   Additional history obtained:  Additional history obtained from EMR External records  from outside source obtained and reviewed including prior labs on file.  CT from 12/12/2023 does note ill-defined area of parotid tonicity enhancement in the right lobe of the liver and in segment 4B are not evaluated on the CT.  No associated mass effect.  These likely represent areas of delayed perfusion or scarring versus edema.  Correlation with LFTs and further evaluation ultrasound recommended.  The gallbladder is unremarkable.   Lab Tests:  I Ordered, and personally interpreted labs.  The pertinent results include: CBC with WC elevated at 20.5 with increase in neutrophils.  Patient is afebrile without infection concerns.  CMP with creatinine slightly elevated 1.46, AST of 324, ALT of 101 and bili of 3.3.  Salicylate negative, acetaminophen  negative Tylenol  negative.  UDS positive for amphetamines.   Imaging Studies ordered:  I ordered imaging studies including ultrasound right upper quadrant due to elevated LFTs and bili Pending at time of signout to oncoming provider   Cardiac Monitoring: / EKG:  The patient was maintained on a cardiac monitor.  I personally viewed and interpreted the cardiac monitored which showed an underlying rhythm of: Sinus tachycardia, rate 126   Problem List / ED Course / Critical interventions / Medication management  43 year old male brought in by police under IVC as above.  On exam, patient is anxious, denies acute complaints or concerns tonight other than as history above.  He is found to have leukocytosis, add on blood cultures due to this although he is afebrile, nontoxic.  LFTs elevated with elevated bili, will  add on hepatitis panel and right upper quadrant ultrasound.  Request consultation with behavioral health.  Care signed out at change of shift pending right upper quadrant ultrasound and behavioral health evaluation. I ordered medication including Geodon  Reevaluation of the patient after these medicines showed that the patient calm I have reviewed the patients home medicines and have made adjustments as needed   Consultations Obtained:  I requested consultation with the behavioral health team,  and discussed lab and imaging findings as well as pertinent plan - they recommend: Evaluation pending at time of signout   Social Determinants of Health:  Has PCP   Test / Admission - Considered:  Pending right upper quadrant ultrasound, depending on these results, can likely clear for behavioral health evaluation and disposition.      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

## 2024-02-25 NOTE — ED Notes (Signed)
 Ivc'ed in the field by [employer] no case # findings and custody and A & P  scanned in the chart but not downloaded to the sdrive  waiting  for  doc/pa  to sign up

## 2024-02-25 NOTE — ED Notes (Signed)
 Police officer reported that the patient attempted to take the cords from the [employer] and wrap around his neck but they were able to grab the cords from him before he could. All cords removed from area of patient.

## 2024-02-25 NOTE — ED Triage Notes (Signed)
 Pt arrives via [employer] under IVC orders due to suicide attempt. According to police, the patient called them stating he was being following and when they arrived to him they found drug paraphernalia and placed him under arrest. Tonight, when he was in the jail for processing he used the drawstring of his pants and tied it around his neck. No LOC. The police state he told them [place] tonight and is paranoid that people are following him because of his positive HIV status.

## 2024-02-26 ENCOUNTER — Emergency Department (HOSPITAL_COMMUNITY): Payer: MEDICAID

## 2024-02-26 DIAGNOSIS — F29 Unspecified psychosis not due to a substance or known physiological condition: Secondary | ICD-10-CM | POA: Diagnosis not present

## 2024-02-26 DIAGNOSIS — F19959 Other psychoactive substance use, unspecified with psychoactive substance-induced psychotic disorder, unspecified: Secondary | ICD-10-CM | POA: Diagnosis not present

## 2024-02-26 DIAGNOSIS — F22 Delusional disorders: Secondary | ICD-10-CM | POA: Diagnosis not present

## 2024-02-26 LAB — COMPREHENSIVE METABOLIC PANEL WITH GFR
ALT: 101 U/L — ABNORMAL HIGH (ref 0–44)
AST: 324 U/L — ABNORMAL HIGH (ref 15–41)
Albumin: 4.4 g/dL (ref 3.5–5.0)
Alkaline Phosphatase: 57 U/L (ref 38–126)
Anion gap: 22 — ABNORMAL HIGH (ref 5–15)
BUN: 28 mg/dL — ABNORMAL HIGH (ref 6–20)
CO2: 15 mmol/L — ABNORMAL LOW (ref 22–32)
Calcium: 9.1 mg/dL (ref 8.9–10.3)
Chloride: 99 mmol/L (ref 98–111)
Creatinine, Ser: 1.46 mg/dL — ABNORMAL HIGH (ref 0.61–1.24)
GFR, Estimated: >60 mL/min (ref >60)
Glucose, Bld: 95 mg/dL (ref 70–99)
Potassium: 3.9 mmol/L (ref 3.5–5.1)
Sodium: 136 mmol/L (ref 135–145)
Total Bilirubin: 3.3 mg/dL — ABNORMAL HIGH (ref 0.0–1.2)
Total Protein: 7.5 g/dL (ref 6.5–8.1)

## 2024-02-26 LAB — RAPID URINE DRUG SCREEN, HOSP PERFORMED
Amphetamines: POSITIVE — AB
Barbiturates: NOT DETECTED
Benzodiazepines: NOT DETECTED
Cocaine: NOT DETECTED
Opiates: NOT DETECTED
Tetrahydrocannabinol: NOT DETECTED

## 2024-02-26 LAB — I-STAT CHEM 8, ED
BUN: 28 mg/dL — ABNORMAL HIGH (ref 6–20)
Calcium, Ion: 1.06 mmol/L — ABNORMAL LOW (ref 1.15–1.40)
Chloride: 104 mmol/L (ref 98–111)
Creatinine, Ser: 1.4 mg/dL — ABNORMAL HIGH (ref 0.61–1.24)
Glucose, Bld: 98 mg/dL (ref 70–99)
HCT: 45 % (ref 39.0–52.0)
Hemoglobin: 15.3 g/dL (ref 13.0–17.0)
Potassium: 3.8 mmol/L (ref 3.5–5.1)
Sodium: 135 mmol/L (ref 135–145)
TCO2: 15 mmol/L — ABNORMAL LOW (ref 22–32)

## 2024-02-26 LAB — HEPATITIS PANEL, ACUTE
HCV Ab: NONREACTIVE
Hep A IgM: NONREACTIVE
Hep B C IgM: NONREACTIVE
Hepatitis B Surface Ag: NONREACTIVE

## 2024-02-26 LAB — CBC WITH DIFFERENTIAL/PLATELET
Abs Immature Granulocytes: 0.14 K/uL — ABNORMAL HIGH (ref 0.00–0.07)
Basophils Absolute: 0 K/uL (ref 0.0–0.1)
Basophils Relative: 0 %
Eosinophils Absolute: 0 K/uL (ref 0.0–0.5)
Eosinophils Relative: 0 %
HCT: 42.6 % (ref 39.0–52.0)
Hemoglobin: 14.8 g/dL (ref 13.0–17.0)
Immature Granulocytes: 1 %
Lymphocytes Relative: 11 %
Lymphs Abs: 2.3 K/uL (ref 0.7–4.0)
MCH: 30.9 pg (ref 26.0–34.0)
MCHC: 34.7 g/dL (ref 30.0–36.0)
MCV: 88.9 fL (ref 80.0–100.0)
Monocytes Absolute: 2.1 K/uL — ABNORMAL HIGH (ref 0.1–1.0)
Monocytes Relative: 10 %
Neutro Abs: 16 K/uL — ABNORMAL HIGH (ref 1.7–7.7)
Neutrophils Relative %: 78 %
Platelets: 283 K/uL (ref 150–400)
RBC: 4.79 MIL/uL (ref 4.22–5.81)
RDW: 13.6 % (ref 11.5–15.5)
WBC: 20.5 K/uL — ABNORMAL HIGH (ref 4.0–10.5)
nRBC: 0 % (ref 0.0–0.2)

## 2024-02-26 LAB — ETHANOL: Alcohol, Ethyl (B): <15 mg/dL (ref <15)

## 2024-02-26 LAB — ACETAMINOPHEN LEVEL: Acetaminophen (Tylenol), Serum: <10 ug/mL — ABNORMAL LOW (ref 10–30)

## 2024-02-26 LAB — SALICYLATE LEVEL: Salicylate Lvl: <7 mg/dL — ABNORMAL LOW (ref 7.0–30.0)

## 2024-02-26 MED ORDER — SODIUM CHLORIDE 0.9 % IV BOLUS
1000.0000 mL | Freq: Once | INTRAVENOUS | Status: AC
  Administered 2024-02-26: 1000 mL via INTRAVENOUS

## 2024-02-26 MED ORDER — HYDROXYZINE HCL 25 MG PO TABS
25.0000 mg | ORAL_TABLET | Freq: Three times a day (TID) | ORAL | Status: DC | PRN
  Filled 2024-02-26: qty 1

## 2024-02-26 MED ORDER — MAGIC MOUTHWASH
2.0000 mL | Freq: Two times a day (BID) | ORAL | Status: DC | PRN
  Administered 2024-02-27: 2 mL via ORAL
  Filled 2024-02-26: qty 5

## 2024-02-26 MED ORDER — OLANZAPINE 10 MG PO TABS
5.0000 mg | ORAL_TABLET | Freq: Two times a day (BID) | ORAL | Status: DC
  Administered 2024-02-26 – 2024-02-27 (×3): 5 mg via ORAL
  Filled 2024-02-26 (×2): qty 1

## 2024-02-26 MED ORDER — OLANZAPINE 10 MG PO TABS: 5.0000 mg | ORAL_TABLET | Freq: Three times a day (TID) | ORAL | Status: DC | PRN

## 2024-02-26 MED ORDER — ZIPRASIDONE MESYLATE 20 MG IM SOLR: 20.0000 mg | Freq: Once | INTRAMUSCULAR | Status: DC

## 2024-02-26 MED ORDER — ALPRAZOLAM 0.25 MG PO TABS
0.2500 mg | ORAL_TABLET | Freq: Once | ORAL | Status: AC
  Administered 2024-02-26: 0.25 mg via ORAL
  Filled 2024-02-26: qty 1

## 2024-02-26 MED ORDER — FLUOXETINE HCL 20 MG PO CAPS
40.0000 mg | ORAL_CAPSULE | Freq: Every day | ORAL | Status: DC
  Administered 2024-02-26 – 2024-02-27 (×2): 40 mg via ORAL
  Filled 2024-02-26 (×2): qty 2

## 2024-02-26 MED ORDER — ZIPRASIDONE MESYLATE 20 MG IM SOLR
20.0000 mg | Freq: Four times a day (QID) | INTRAMUSCULAR | Status: DC | PRN
  Administered 2024-02-26: 20 mg via INTRAMUSCULAR
  Filled 2024-02-26: qty 20

## 2024-02-26 NOTE — ED Notes (Signed)
 Envelope #[id number] has been submitted successfully. Patient came in [place] , paperwork have been filed in [place]. Original copy in red folder, I in medical records 3 copies on purple folder in orange

## 2024-02-26 NOTE — ED Notes (Signed)
 Patient wanted family aware that he was here , I called patient ex wife Delon and update given at patient request.

## 2024-02-26 NOTE — ED Notes (Signed)
 Lunch tray ordered

## 2024-02-26 NOTE — ED Notes (Signed)
 Patient transported to Ultrasound

## 2024-02-26 NOTE — BH Assessment (Signed)
 Received a message from RN, Kaityn Corum that pt was given Geodon  for agitation. Clinician will check back and remain available to see patient for TTS assessment.

## 2024-02-26 NOTE — Consult Note (Signed)
 [facility] Psychiatric Consult Initial  Patient Name: .Jimmy Fisher  MRN: [id number]  DOB: 04/22/1981  Consult Order details:  Orders (From admission, onward)     Start     Ordered   02/26/24 1507  CONSULT TO CALL ACT TEAM       Ordering Provider: Bernard Drivers, MD  Provider:  (Not yet assigned)  Question:  Reason for Consult?  Answer:  Please assess patient   02/26/24 1506   02/25/24 2344  CONSULT TO CALL ACT TEAM       Ordering Provider: Beverley Leita DELENA, PA-C  Provider:  (Not yet assigned)  Question:  Reason for Consult?  Answer:  suicide attempt   02/25/24 2344             Mode of Visit: In person    Psychiatry Consult Evaluation  Service Date: February 26, 2024 LOS:  LOS: 0 days  Chief Complaint I am ashamed  Primary Psychiatric Diagnoses  Substance-induced psychosis 2.  Psychosis paranoia   Assessment  Jimmy Fisher is a 43 y.o. male admitted: Presented to the EDfor 02/25/2024 10:51 PM under IVC by [employer] after he felt like 6 people were chasing him due to his HIV status, shot at [facility] from [place], normal liver he will place to arrive found with drug paraphernalia patient thought he was going to jail.  Plan was for patient to be taken to [facility] but then he pulled a drawstring from his pants and wrapped it around his neck.Jimmy Fisher He carries the psychiatric diagnoses of MDD, polysubstance abuse, alcohol use disorder, substance-induced psychotic disorder, stimulant use disorder, and has a past medical history of HIV.   His current presentation of delusional thought content and paranoia in the context of methamphetamine use is most consistent with substance-induced psychosis. He meets criteria for inpatient psychiatric admission based on current symptomology.  Current outpatient psychotropic medications include Prozac  40 mg daily and historically he has had a good response to these medications.  He reports compliance with medications.   Attempted to evaluate  patient this a.m.  He was in a hallway bed and refused to talk as he felt people were going to listen to him and he did not want others to hear his personal information.  Assessment was delayed until patient could be moved to a private room  At 3:10 PM patient assessed.  He is alert/oriented x 4 and cooperative.  However he appears extremely anxious.  He is delusional and very paranoid.  He looks to the door often.  When someone walks past the room during assessment he stops talking and stares and will make sure that they are no longer in sight.  He even accused this Clinical research associate of allowing someone to stand outside the door and listen to his private information.  He is tangential and extremely labile.  He is tearful and is easily agitated.  He recalls events that led to hospitalization.  Reports he had not used meth in over 6 months but 3 days ago he relapsed.  On Friday he decided to have 1 drink and that led to him using methamphetamines that day and on Saturday.  Patient reports on Saturday people began to follow him. He was in his car and he drove all around [place] and everywhere he went these people were following him.States there were too many to count.   At one point  he had a girl in the car with him and she was looking at these people and  told him look, that's the shooter.  Eventually he was on foot and ran to a strangers house and asked for them to call 911.  He was afraid that people were going to catch him but he jumped into the lake and began swimming.  Police did eventually did arrive and took patient into custody.  He believes people and police are targeting him because he has tested positive for HIV in the past but he is no longer detectable.  He believes that people do not understand that when you are no longer detectable you cannot transmit HIV.  He believes that these people following him thinks that he has been sleeping with prostitutes and giving them HIV.  He believes that they want to  kill him for this.  He recalls that when he was taken into custody yesterday he heard the police officer saying that they were going to unlock a cell door and allow another inmate to take care of him.  This is why he put the shoelace around his neck because he had the thought that if someone was going to kill him he might as well kill himself first.  He is currently denying suicidal ideations and homicidal ideations.  He denies AVH.  He does not appear to be responding to internal/external stimuli but as mentioned previously his extremely paranoid.   Patient states before his relapse things were going well he was even beginning to talk to his ex-wife and they had began dating again.  He had a job interview lined up.  He also reports when he is using methamphetamines that it increases a sexual addiction in him and takes him to a very dark place.  He feels extremely ashamed that he has relapsed.  Please see plan below for detailed recommendations.   Diagnoses:  Active Hospital problems: Principal Problem:   Substance-induced psychotic disorder (Jimmy Fisher)    Plan   ## Psychiatric Medication Recommendations:  Start Zyprexa  5 mg twice daily Start Prozac  40 mg daily Start hydroxyzine  25 mg 3 times daily as needed for anxiety  ## Medical Decision Making Capacity: Not specifically addressed in this encounter  ## Further Work-up:  -- None  -- most recent EKG on 02/25/2024 had QtC of 416 -- Pertinent labwork reviewed earlier this admission includes: UDS positive for amphetamines, BAL<15, salicylate level<7, CBC, CMP Hip OA nonreactive, hep B nonreactive, Ultrasound of abdomen   ## Disposition:-- We recommend inpatient psychiatric hospitalization after medical hospitalization. Patient has been involuntarily committed on 02/25/2024.   ## Behavioral / Environmental: -Difficult Patient (SELECT OPTIONS FROM BELOW) or To minimize splitting of staff, assign one staff person to communicate all information  from the team when feasible. Patient extremely paranoid and delusional.   ## Safety and Observation Level:  - Based on my clinical evaluation, I estimate the patient to be at low risk of self harm in the current setting. - At this time, we recommend  routine. This decision is based on my review of the chart including patient's history and current presentation, interview of the patient, mental status examination, and consideration of suicide risk including evaluating suicidal ideation, plan, intent, suicidal or self-harm behaviors, risk factors, and protective factors. This judgment is based on our ability to directly address suicide risk, implement suicide prevention strategies, and develop a safety plan while the patient is in the clinical setting. Please contact our team if there is a concern that risk level has changed.  CSSR Risk Category:C-SSRS RISK CATEGORY: High Risk  Suicide  Risk Assessment: Patient has following modifiable risk factors for suicide: active mental illness (to encompass adhd, tbi, mania, psychosis, trauma reaction) and triggering events, which we are addressing by recommending inpatient psychiatric admission. Patient has following non-modifiable or demographic risk factors for suicide: male gender, history of suicide attempt, history of self harm behavior, and psychiatric hospitalization Patient has the following protective factors against suicide: Access to outpatient mental health care  Thank you for this consult request. Recommendations have been communicated to the primary team.  We will continue to follow while awaiting psychiatric bed placement at this time.   Elveria VEAR Batter, NP       History of Present Illness  Relevant Aspects of Hospital ED Course:  Admitted on 9/13/2025under IVC by [employer] after he felt like 6 people were chasing him due to his HIV status, shot at [facility] from [place], normal liver he will place to arrive found with drug paraphernalia patient  thought he was going to jail.  Plan was for patient to be taken to [facility] UC but then he pulled a drawstring from his pants and wrapped it around his neck.Jimmy Fisher He carries the psychiatric diagnoses of MDD, polysubstance abuse, alcohol use disorder, substance-induced psychotic disorder, stimulant use disorder, and has a past medical history of HIV.   Patient Report:   I am ashamed  Leita Chancy, PA-C, 43 yo male brought in by [facility] for suicide attempt, IVC by [employer]. Patient felt like there were 6 people chasing him today because of his HIV status, shot up meth and swam [place] where he then called 911 for help. Police arrived, walked patient back to the other side of the lake to get his belongings, drug paraphernalia was found and he was taken to jail. The plan was for patient to then be taken to [facility] for help with substance abuse and paranoia when he pulled the drawstring from his pants and wrapped it around his neck. This was witnessed by staff and removed, patient did not loose consciousness. He denies any complaints other than as stated above. Last td less than 5 years ago.  RN triage note, Pt arrives via [employer] under IVC orders due to suicide attempt. According to police, the patient called them stating he was being following and when they arrived to him they found drug paraphernalia and placed him under arrest. Tonight, when he was in the jail for processing he used the drawstring of his pants and tied it around his neck. No LOC. The police state he told them he swam across a lake tonight and is paranoid that people are following him because of his positive HIV status  Psych ROS:  Depression: Endorses Anxiety: Endorses Mania (lifetime and current): Denies Psychosis: (lifetime and current): Endorses  Collateral information:  Gave no permission to contact collateral   Review of Systems  Constitutional:  Negative for chills and fever.  Respiratory:  Negative for cough and shortness of breath.    Cardiovascular:  Negative for chest pain.  Gastrointestinal:  Negative for nausea and vomiting.  Psychiatric/Behavioral:  Positive for depression and substance abuse. The patient is nervous/anxious.      Psychiatric and Social History  Psychiatric History:  Information collected from chart review and patient  Prev Dx/Sx: MDD, polysubstance abuse, alcohol use disorder, substance-induced psychotic disorder, stimulant use disorder, Current Psych Provider: No psych provider PCP Dr. Eben prescribes medication Home Meds (current): Prozac  40 mg daily Previous Med Trials: Zyprexa  Therapy: None  Prior Psych Hospitalization: Multiple Prior  Self Harm: History of has not cut in quite some time Prior Violence: Denies but is currently on probation due to breaking into ex-wife's home and stealing some of his property including firearms  Family Psych History: Denies Family Hx suicide: Denies  Social History:  Developmental Hx: Denies Educational Hx: GED Occupational Hx: Unemployed Legal Hx: On probation Living Situation: Lives with mother Spiritual Hx: Christian Access to weapons/lethal means: Denies  Substance History  Patient has a past history of opiate use, crack cocaine abuse, methamphetamine abuse, and alcohol use. Patient Had been free of substances for 6 months and 3 days ago relapsed on alcohol and methamphetamines He did not elaborate on how much alcohol he consumed but it was consumed on Friday 2 days ago History of alcohol withdrawal seizures denies History of DT's denies Tobacco: Denies Illicit drugs: Methamphetamine relapse 3 days ago unsure of amount uses via injecting used 2 times Friday and Saturday Rehab hx: Multiple including at [facility]  Exam Findings  Physical Exam:  Vital Signs:  Temp:  [98.2 F (36.8 C)-98.9 F (37.2 C)] 98.2 F (36.8 C) (09/14 0923) Pulse Rate:  [88-130] 88 (09/14 0923) Resp:  [14-20] 20 (09/14 0923) BP:  (110-140)/(74-84) 124/74 (09/14 0923) SpO2:  [95 %-98 %] 96 % (09/14 0923) Weight:  [90.7 kg] 90.7 kg (09/13 2302) Blood pressure 124/74, pulse 88, temperature 98.2 F (36.8 C), temperature source Oral, resp. rate 20, height 5' 6 (1.676 m), weight 90.7 kg, SpO2 96%. Body mass index is 32.28 kg/m.  Physical Exam Musculoskeletal:        General: Normal range of motion.     Cervical back: Normal range of motion.  Neurological:     Mental Status: He is alert and oriented to person, place, and time.  Psychiatric:        Attention and Perception: He is inattentive.        Mood and Affect: Mood is anxious. Affect is labile and tearful.        Speech: Speech is tangential.        Behavior: Behavior is agitated.        Thought Content: Thought content is paranoid and delusional.        Cognition and Memory: Cognition normal.        Judgment: Judgment is impulsive.     Mental Status Exam: General Appearance: Casual  Orientation:  Full (Time, Place, and Person)  Memory:  Immediate;   Fair Recent;   Fair Remote;   Fair  Concentration:  Concentration: Fair and Attention Span: Fair  Recall:  Fair  Attention  Fair  Eye Contact:  Fair  Speech:  Clear and Coherent, Normal Rate, and whispers at times so others cannot hear him  Language:  Good  Volume:  Decreased at times so others cannot hear him  Mood: ' Ashamed  Affect:  Labile  Thought Process:  Descriptions of Associations: Tangential  Thought Content:  Delusions and Paranoid Ideation  Suicidal Thoughts:  No  Homicidal Thoughts:  No  Judgement:  Impaired  Insight:  Lacking  Psychomotor Activity:  Restlessness  Akathisia:  No  Fund of Knowledge:  Fair      Assets:  Housing Leisure Time Physical Health Resilience  Cognition:  WNL  ADL's:  Intact  AIMS (if indicated):        Other History   These have been pulled in through the EMR, reviewed, and updated if appropriate.  Family History:  The patient's family history  includes Cancer in his father; Fibromyalgia in his mother.  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   Neuromuscular disorder (Jimmy Fisher)    fibromyalgia   Pulmonary embolism (Jimmy Fisher)    2012   SLAP tear of shoulder    left   Urticarial rash 12/05/2022   Vaccine counseling 05/09/2023    Surgical History: Past Surgical History:  Procedure Laterality Date   COLONOSCOPY     I & D EXTREMITY Left 07/16/2020   Procedure: IRRIGATION AND DEBRIDEMENT FOREARM;  Surgeon: Shari Easter, MD;  Location: [facility] OR;  Service: Orthopedics;  Laterality: Left;   IRRIGATION AND DEBRIDEMENT ELBOW Right 11/21/2023   Procedure: IRRIGATION AND DEBRIDEMENT ELBOW;  Surgeon: Beuford Anes, MD;  Location: [facility] ORS;  Service: Orthopedics;  Laterality: Right;   SHOULDER ARTHROSCOPY WITH BICEPS TENDON REPAIR Left 10/22/2020   Procedure: LEFT SHOULDER ARTHROSCOPY, BICEPS TENODESIS;  Surgeon: Jerri Kay HERO, MD;  Location: [facility];  Service: Orthopedics;  Laterality: Left;   SUBACROMIAL DECOMPRESSION Left 10/22/2020   Procedure: SUBACROMIAL DECOMPRESSION;  Surgeon: Jerri Kay HERO, MD;  Location: [facility];  Service: Orthopedics;  Laterality: Left;   UPPER GI ENDOSCOPY       Medications:   Current Facility-Administered Medications:    ziprasidone  (GEODON ) injection 20 mg, 20 mg, Intramuscular, Q6H PRN, Murphy, Laura A, PA-C, 20 mg at 02/26/24 0247  Current Outpatient Medications:    acetaminophen  (TYLENOL ) 500 MG tablet, Take 1,000 mg by mouth 2 (two) times daily as needed for headache. takes with 400mg  ibuprofen , Disp: , Rfl:     bictegravir-emtricitabine -tenofovir  AF (BIKTARVY ) 50-200-25 MG TABS tablet, Take 1 tablet by mouth daily., Disp: 30 tablet, Rfl: 11   FLUoxetine  (PROZAC ) 40 MG capsule, Take 1 capsule (40 mg total) by mouth daily., Disp: 30 capsule, Rfl: 0   folic acid  (FOLVITE ) 1 MG tablet, Take 1 mg by mouth daily., Disp: , Rfl:    ibuprofen  (ADVIL ) 200 MG tablet, Take 400 mg by mouth 2 (two) times daily as needed for headache. takes with 1000mg  acetaminophen , Disp: , Rfl:    Multiple Vitamin (MULTIVITAMIN) tablet, Take 1 tablet by mouth daily., Disp: , Rfl:    olmesartan  (BENICAR ) 20 MG tablet, Take 20 mg by mouth daily., Disp: , Rfl:    omeprazole  (PRILOSEC) 20 MG capsule, Take 1 capsule (20 mg total) by mouth 2 (two) times daily. (Patient taking differently: Take 20 mg by mouth daily.), Disp: 60 capsule, Rfl: 0   sucralfate  (CARAFATE ) 1 g tablet, Take 1 tablet (1 g total) by mouth 3 (three) times daily with meals. (Patient taking differently: Take 1 g by mouth 3 (three) times daily as needed (stomach pain, reflux).), Disp: 90 tablet, Rfl: 0   thiamine  (VITAMIN B1) 100 MG tablet, Take 100 mg by mouth daily., Disp: , Rfl:    atorvastatin  (LIPITOR) 10 MG tablet, Take 1 tablet (10 mg total) by mouth daily. (Patient not taking: Reported on 02/26/2024), Disp: 30 tablet, Rfl: 0  Allergies: Allergies  Allergen Reactions   Vibramycin  [Doxycycline ] Itching, Rash and Other (See Comments)    Photosensitivity resulting in sunburn, rash, itching  Elveria VEAR Batter, NP

## 2024-02-26 NOTE — Progress Notes (Signed)
 Patient has been denied by [facility] due to no appropriate beds available. Patient meets BH inpatient criteria per Elveria Batter, NP. Patient has been faxed out to the following facilities:   [facility]  [address] [place] [address] [phone]  [facility]  [address], [place] [address] [phone]  [facility]  [address] [place], [place] [address] [phone]  [facility] Patient Placement  [facility], [place] [phone]  [facility]  [address] [place] [address] [phone]  [facility]  [address] [place] [address] [phone]  [facility]  [address] [place] [address] [phone]  [facility] EFAX  [address] [place], [place] [phone]  [facility]  [address] [place] [address] [phone]  [facility]  [address] [place] [address] [phone]  [facility]  [address] [place], [place] [address] [phone]  [facility]  [address] [place] [address] [phone]  [facility]  [address] [place] [address] [phone]  [facility]  [address] [place] [address] [phone]  [facility]  [address] [place] [address] [phone]  [facility]  [address] [place] [address] [phone]  [facility]  [address] [place] [address]  [phone]    Bunnie Gallop, MSW, LCSW-A  4:39 PM 02/26/2024

## 2024-02-26 NOTE — ED Notes (Signed)
 Staffing called again with no sitter available until 3pm.

## 2024-02-26 NOTE — ED Notes (Signed)
Shift report received, assumed care of patient at this time 

## 2024-02-26 NOTE — BH Assessment (Signed)
 0440am patient still sedated per RN.

## 2024-02-26 NOTE — ED Notes (Signed)
 Staffing called with no sitter available.

## 2024-02-26 NOTE — BH Assessment (Addendum)
 TTS clinician sent secure message to the ED care team in order to complete tele assessment. Clinician awaiting response from ED care team.   Message received from ED care team, in the process of finding a room for the pt to complete TTS assessment. Clinician will remain available.

## 2024-02-26 NOTE — ED Notes (Signed)
Unable to obtain second set of blood cultures.  

## 2024-02-26 NOTE — ED Notes (Signed)
 IVC PAPER ATTACHED TO THE CLIPBOARD IN ORANGE ZONE

## 2024-02-26 NOTE — BH Assessment (Signed)
 Clinician sent a follow up message to ED care team in order to complete TTS assessment. Awaiting response.

## 2024-02-27 ENCOUNTER — Other Ambulatory Visit: Payer: Self-pay

## 2024-02-27 DIAGNOSIS — F19959 Other psychoactive substance use, unspecified with psychoactive substance-induced psychotic disorder, unspecified: Secondary | ICD-10-CM

## 2024-02-27 DIAGNOSIS — F29 Unspecified psychosis not due to a substance or known physiological condition: Secondary | ICD-10-CM

## 2024-02-27 NOTE — ED Notes (Signed)
 Pt's mother brought the pt's Biktarvy  at the request of [facility].

## 2024-02-27 NOTE — ED Notes (Signed)
 3 copies of IVC documents verified and transferred from the orange zone to the clipboard in the blue zone at the nurses's station.

## 2024-02-27 NOTE — ED Notes (Signed)
 [facility] sheriff office called for transportation. Was informed they may not be able to get to it tonight. Possibly could be in the morning.

## 2024-02-27 NOTE — Progress Notes (Signed)
 Pt has been accepted to [facility] on 02/27/2024 Bed assignment: 900 UNIT  Pt meets inpatient criteria per: Elveria Batter NP  Attending Physician will be: Oneil Charleston MD   Report can be called to:[phone]  Pt can arrive after ASAP  Care Team Notified: Elveria Batter NP, Dena Kelch RN  [place] Sulema Braid LCSW-A   02/27/2024 2:05 PM

## 2024-02-27 NOTE — Consult Note (Signed)
 [facility] Psychiatric Consult Initial  Patient Name: .Jimmy Fisher  MRN: [id number]  DOB: 18-Aug-1980  Consult Order details:  Orders (From admission, onward)     Start     Ordered   02/26/24 1507  CONSULT TO CALL ACT TEAM       Ordering Provider: Bernard Drivers, MD  Provider:  (Not yet assigned)  Question:  Reason for Consult?  Answer:  Please assess patient   02/26/24 1506   02/25/24 2344  CONSULT TO CALL ACT TEAM       Ordering Provider: Beverley Leita DELENA, PA-C  Provider:  (Not yet assigned)  Question:  Reason for Consult?  Answer:  suicide attempt   02/25/24 2344             Mode of Visit: In person    Psychiatry Consult Evaluation  Service Date: February 27, 2024 LOS:  LOS: 0 days  Chief Complaint I am ashamed  Primary Psychiatric Diagnoses  Substance-induced psychosis 2.  Psychosis paranoia   Assessment  [name] is a 43 y.o. male admitted: Presented to the EDfor 02/25/2024 10:51 PM under IVC by [employer] after he felt like 6 people were chasing him due to his HIV status, shot at [facility] from [place], normal liver he will place to arrive found with drug paraphernalia patient thought he was going to jail.  Plan was for patient to be taken to [facility] but then he pulled a drawstring from his pants and wrapped it around his neck.[name] He carries the psychiatric diagnoses of MDD, polysubstance abuse, alcohol use disorder, substance-induced psychotic disorder, stimulant use disorder, and has a past medical history of HIV.   His current presentation of delusional thought content and paranoia in the context of methamphetamine use is most consistent with substance-induced psychosis. He meets criteria for inpatient psychiatric admission based on current symptomology.  Current outpatient psychotropic medications include Prozac  40 mg daily and historically he has had a good response to these medications.  He reports compliance with medications.   02/27/2024 Patient  initially admitted for paranoia, delusions, and agitation.  On today's assessment, patient is lying in bed with covers in place, minimally participatory, and reports feeling sleepy.  Denies suicidal ideation, homicidal ideation, or thoughts of aggression towards others.  Denies paranoia.  Denies any AVH.  Per nursing staff, patient has been compliant with medication and has required no as needed medications throughout the night.  02/26/2024 Attempted to evaluate patient this a.m.  He was in a hallway bed and refused to talk as he felt people were going to listen to him and he did not want others to hear his personal information.  Assessment was delayed until patient could be moved to a private room  02/26/2024 At 3:10 PM patient assessed.  He is alert/oriented x 4 and cooperative.  However he appears extremely anxious.  He is delusional and very paranoid.  He looks to the door often.  When someone walks past the room during assessment he stops talking and stares and will make sure that they are no longer in sight.  He even accused this Clinical research associate of allowing someone to stand outside the door and listen to his private information.  He is tangential and extremely labile.  He is tearful and is easily agitated.  He recalls events that led to hospitalization.  Reports he had not used meth in over 6 months but 3 days ago he relapsed.  On Friday he decided to have 1 drink and  that led to him using methamphetamines that day and on Saturday.  Patient reports on Saturday people began to follow him. He was in his car and he drove all around [place] and everywhere he went these people were following him.States there were too many to count.   At one point  he had a girl in the car with him and she was looking at these people and  told him look, that's the shooter.  Eventually he was on foot and ran to a strangers house and asked for them to call 911.  He was afraid that people were going to catch him but he [place]  [place] and [place].  Police did eventually did arrive and took patient into custody.  He believes people and police are targeting him because he has tested positive for HIV in the past but he is no longer detectable.  He believes that people do not understand that when you are no longer detectable you cannot transmit HIV.  He believes that these people following him thinks that he has been sleeping with prostitutes and giving them HIV.  He believes that they want to kill him for this.  He recalls that when he was taken into custody yesterday he heard the police officer saying that they were going to unlock a cell door and allow another inmate to take care of him.  This is why he put the shoelace around his neck because he had the thought that if someone was going to kill him he might as well kill himself first.  He is currently denying suicidal ideations and homicidal ideations.  He denies AVH.  He does not appear to be responding to internal/external stimuli but as mentioned previously his extremely paranoid.   Patient states before his relapse things were going well he was even beginning to talk to his ex-wife and they had began dating again.  He had a job interview lined up.  He also reports when he is using methamphetamines that it increases a sexual addiction in him and takes him to a very dark place.  He feels extremely ashamed that he has relapsed.  Please see plan below for detailed recommendations.   Diagnoses:  Active Hospital problems: Principal Problem:   Substance-induced psychotic disorder (HCC)    Plan   ## Psychiatric Medication Recommendations:  Start Zyprexa  5 mg twice daily Start Prozac  40 mg daily Start hydroxyzine  25 mg 3 times daily as needed for anxiety  ## Medical Decision Making Capacity: Not specifically addressed in this encounter  ## Further Work-up:  -- None  -- most recent EKG on 02/25/2024 had QtC of 416 -- Pertinent labwork reviewed earlier this  admission includes: UDS positive for amphetamines, BAL<15, salicylate level<7, CBC, CMP Hip OA nonreactive, hep B nonreactive, Ultrasound of abdomen   ## Disposition:-- We recommend inpatient psychiatric hospitalization after medical hospitalization. Patient has been involuntarily committed on 02/25/2024.   ## Behavioral / Environmental: -Difficult Patient (SELECT OPTIONS FROM BELOW) or To minimize splitting of staff, assign one staff person to communicate all information from the team when feasible. Patient extremely paranoid and delusional.   ## Safety and Observation Level:  - Based on my clinical evaluation, I estimate the patient to be at low risk of self harm in the current setting. - At this time, we recommend  routine. This decision is based on my review of the chart including patient's history and current presentation, interview of the patient, mental status examination, and consideration  of suicide risk including evaluating suicidal ideation, plan, intent, suicidal or self-harm behaviors, risk factors, and protective factors. This judgment is based on our ability to directly address suicide risk, implement suicide prevention strategies, and develop a safety plan while the patient is in the clinical setting. Please contact our team if there is a concern that risk level has changed.  CSSR Risk Category:C-SSRS RISK CATEGORY: High Risk  Suicide Risk Assessment: Patient has following modifiable risk factors for suicide: active mental illness (to encompass adhd, tbi, mania, psychosis, trauma reaction) and triggering events, which we are addressing by recommending inpatient psychiatric admission. Patient has following non-modifiable or demographic risk factors for suicide: male gender, history of suicide attempt, history of self harm behavior, and psychiatric hospitalization Patient has the following protective factors against suicide: Access to outpatient mental health care  Thank you for  this consult request. Recommendations have been communicated to the primary team.  We will continue to follow while awaiting psychiatric bed placement at this time.   Elveria VEAR Batter, NP       History of Present Illness  Relevant Aspects of Hospital ED Course:  Admitted on 9/13/2025under IVC by [employer] after he felt like 6 people were chasing him due to his HIV status, shot at [facility] from [place], normal liver he will place to arrive found with drug paraphernalia patient thought he was going to jail.  Plan was for patient to be taken to [facility] UC but then he pulled a drawstring from his pants and wrapped it around his neck.[name] He carries the psychiatric diagnoses of MDD, polysubstance abuse, alcohol use disorder, substance-induced psychotic disorder, stimulant use disorder, and has a past medical history of HIV.   Patient Report:   I am ashamed  Leita Chancy, PA-C, 43 yo male brought in by [facility] for suicide attempt, IVC by [employer]. Patient felt like there were 6 people chasing him today because of his HIV status, shot up meth and [place] where he then called 911 for help. Police arrived, walked patient back to the other side of the lake to get his belongings, drug paraphernalia was found and he was taken to jail. The plan was for patient to then be taken to [facility] for help with substance abuse and paranoia when he pulled the drawstring from his pants and wrapped it around his neck. This was witnessed by staff and removed, patient did not loose consciousness. He denies any complaints other than as stated above. Last td less than 5 years ago.  RN triage note, Pt arrives via [employer] under IVC orders due to suicide attempt. According to police, the patient called them stating he was being following and when they arrived to him they found drug paraphernalia and placed him under arrest. Tonight, when he was in the jail for processing he used the drawstring of his pants and tied it around his neck. No LOC.  The police state he told them he swam across a lake tonight and is paranoid that people are following him because of his positive HIV status  Psych ROS:  Depression: Endorses Anxiety: Endorses Mania (lifetime and current): Denies Psychosis: (lifetime and current): Endorses  Collateral information:  Gave no permission to contact collateral   Review of Systems  Constitutional:  Negative for chills and fever.  Respiratory:  Negative for cough and shortness of breath.   Cardiovascular:  Negative for chest pain.  Gastrointestinal:  Negative for nausea and vomiting.  Psychiatric/Behavioral:  Positive for depression and  substance abuse. The patient is nervous/anxious.      Psychiatric and Social History  Psychiatric History:  Information collected from chart review and patient  Prev Dx/Sx: MDD, polysubstance abuse, alcohol use disorder, substance-induced psychotic disorder, stimulant use disorder, Current Psych Provider: No psych provider PCP Dr. Eben prescribes medication Home Meds (current): Prozac  40 mg daily Previous Med Trials: Zyprexa  Therapy: None  Prior Psych Hospitalization: Multiple Prior Self Harm: History of has not cut in quite some time Prior Violence: Denies but is currently on probation due to breaking into ex-wife's home and stealing some of his property including firearms  Family Psych History: Denies Family Hx suicide: Denies  Social History:  Developmental Hx: Denies Educational Hx: GED Occupational Hx: Unemployed Legal Hx: On probation Living Situation: Lives with mother Spiritual Hx: Christian Access to weapons/lethal means: Denies  Substance History  Patient has a past history of opiate use, crack cocaine abuse, methamphetamine abuse, and alcohol use. Patient Had been free of substances for 6 months and 3 days ago relapsed on alcohol and methamphetamines He did not elaborate on how much alcohol he consumed but it was consumed on Friday 2 days  ago History of alcohol withdrawal seizures denies History of DT's denies Tobacco: Denies Illicit drugs: Methamphetamine relapse 3 days ago unsure of amount uses via injecting used 2 times Friday and Saturday Rehab hx: Multiple including at [employer] [facility]  Exam Findings  Physical Exam:  Vital Signs:  Temp:  [97.5 F (36.4 C)-98.6 F (37 C)] 97.5 F (36.4 C) (09/15 0646) Pulse Rate:  [56-107] 56 (09/15 0646) Resp:  [18-22] 18 (09/15 0646) BP: (114-121)/(73) 121/73 (09/15 0646) SpO2:  [96 %-100 %] 96 % (09/15 0646) Blood pressure 121/73, pulse (!) 56, temperature (!) 97.5 F (36.4 C), temperature source Oral, resp. rate 18, height 5' 6 (1.676 m), weight 90.7 kg, SpO2 96%. Body mass index is 32.28 kg/m.  Physical Exam Musculoskeletal:        General: Normal range of motion.     Cervical back: Normal range of motion.  Neurological:     Mental Status: He is alert and oriented to person, place, and time.  Psychiatric:        Attention and Perception: He is inattentive.        Mood and Affect: Mood is not anxious. Affect is labile. Affect is not tearful.        Speech: Speech normal.        Behavior: Behavior is not agitated.        Thought Content: Thought content is not paranoid or delusional.        Cognition and Memory: Cognition normal.        Judgment: Judgment is impulsive.     Mental Status Exam: General Appearance: Casual  Orientation:  Full (Time, Place, and Person)  Memory:  Immediate;   Fair Recent;   Fair Remote;   Fair  Concentration:  Concentration: Fair and Attention Span: Fair  Recall:  Fair  Attention  Fair  Eye Contact:  Fair  Speech:  Clear and Coherent and Normal Rate  Language:  Good  Volume:  Decreased   Mood: ' Ashamed  Affect:  Labile  Thought Process: Patient minimally participatory on today's assessment difficult to assess  Thought Content: Patient is currently denying any AVH delusional or paranoid thought content  Suicidal  Thoughts:  No  Homicidal Thoughts:  No  Judgement:  Impaired  Insight:  Lacking  Psychomotor Activity:  Normal  Akathisia:  No  Fund of Knowledge:  Fair      Assets:  Housing Leisure Time Physical Health Resilience  Cognition:  WNL  ADL's:  Intact  AIMS (if indicated):        Other History   These have been pulled in through the EMR, reviewed, and updated if appropriate.  Family History:  The patient's family history includes Cancer in his father; Fibromyalgia in his mother.  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   Neuromuscular disorder (HCC)    fibromyalgia   Pulmonary embolism (HCC)    2012   SLAP tear of shoulder    left   Urticarial rash 12/05/2022   Vaccine counseling 05/09/2023    Surgical History: Past Surgical History:  Procedure Laterality Date   COLONOSCOPY     I & D EXTREMITY Left 07/16/2020   Procedure: IRRIGATION AND DEBRIDEMENT FOREARM;  Surgeon: Shari Easter, MD;  Location: [facility] OR;  Service: Orthopedics;  Laterality: Left;   IRRIGATION AND DEBRIDEMENT ELBOW Right 11/21/2023   Procedure: IRRIGATION AND DEBRIDEMENT ELBOW;  Surgeon: Beuford Anes, MD;  Location: [facility] ORS;  Service: Orthopedics;  Laterality: Right;   SHOULDER ARTHROSCOPY WITH BICEPS TENDON REPAIR Left 10/22/2020   Procedure: LEFT SHOULDER ARTHROSCOPY, BICEPS TENODESIS;  Surgeon: Jerri Kay HERO, MD;  Location: [facility];  Service: Orthopedics;  Laterality: Left;   SUBACROMIAL DECOMPRESSION Left 10/22/2020   Procedure: SUBACROMIAL DECOMPRESSION;  Surgeon: Jerri Kay HERO, MD;  Location: [facility];  Service: Orthopedics;  Laterality:  Left;   UPPER GI ENDOSCOPY       Medications:   Current Facility-Administered Medications:    FLUoxetine  (PROZAC ) capsule 40 mg, 40 mg, Oral, Daily, Keta [place] H, NP, 40 mg at 02/27/24 0920   hydrOXYzine  (ATARAX ) tablet 25 mg, 25 mg, Oral, TID PRN, Mardy Elveria DEL, NP   magic mouthwash, 2 mL, Oral, BID PRN, Rogelia Jerilynn RAMAN, MD, 2 mL at 02/27/24 9078   OLANZapine  (ZYPREXA ) tablet 5 mg, 5 mg, Oral, BID, Mardy Elveria DEL, NP, 5 mg at 02/27/24 0920   OLANZapine  (ZYPREXA ) tablet 5 mg, 5 mg, Oral, Q8H PRN, Mardy Elveria DEL, NP   ziprasidone  (GEODON ) injection 20 mg, 20 mg, Intramuscular, Q6H PRN, Beverley Leita LABOR, PA-C, 20 mg at 02/26/24 0247  Current Outpatient Medications:    acetaminophen  (TYLENOL ) 500 MG tablet, Take 1,000 mg by mouth 2 (two) times daily as needed for headache. takes with 400mg  ibuprofen , Disp: , Rfl:    bictegravir-emtricitabine -tenofovir  AF (BIKTARVY ) 50-200-25 MG TABS tablet, Take 1 tablet by mouth daily., Disp: 30 tablet, Rfl: 11   FLUoxetine  (PROZAC ) 40 MG capsule, Take 1 capsule (40 mg total) by mouth daily., Disp: 30 capsule, Rfl: 0   folic acid  (FOLVITE ) 1 MG tablet, Take 1 mg by mouth daily., Disp: , Rfl:    ibuprofen  (ADVIL ) 200 MG tablet, Take 400 mg by mouth 2 (two) times daily as needed for headache. takes with 1000mg  acetaminophen , Disp: , Rfl:    Multiple Vitamin (MULTIVITAMIN) tablet, Take 1 tablet by mouth daily., Disp: , Rfl:    olmesartan  (BENICAR ) 20 MG tablet, Take  20 mg by mouth daily., Disp: , Rfl:    omeprazole  (PRILOSEC) 20 MG capsule, Take 1 capsule (20 mg total) by mouth 2 (two) times daily. (Patient taking differently: Take 20 mg by mouth daily.), Disp: 60 capsule, Rfl: 0   sucralfate  (CARAFATE ) 1 g tablet, Take 1 tablet (1 g total) by mouth 3 (three) times daily with meals. (Patient taking differently: Take 1 g by mouth 3 (three) times daily as needed (stomach pain, reflux).), Disp: 90 tablet, Rfl: 0   thiamine  (VITAMIN B1) 100 MG  tablet, Take 100 mg by mouth daily., Disp: , Rfl:    atorvastatin  (LIPITOR) 10 MG tablet, Take 1 tablet (10 mg total) by mouth daily. (Patient not taking: Reported on 02/26/2024), Disp: 30 tablet, Rfl: 0  Allergies: Allergies  Allergen Reactions   Vibramycin  [Doxycycline ] Itching, Rash and Other (See Comments)    Photosensitivity resulting in sunburn, rash, itching    Elveria VEAR Batter, NP

## 2024-02-27 NOTE — ED Notes (Signed)
 IVC paperwork and EMTALA printed. Repeat vital signs obtained. Pt transported to [facility] with 2 Sheriffs.

## 2024-02-27 NOTE — ED Notes (Signed)
 [facility] contacted to obtain the case number. Per Ms. Antigone, their computer system is running a little slow and she will call me back with the patient's IVC case number.

## 2024-02-27 NOTE — Progress Notes (Signed)
 LCSW Progress Note  [id number]   JUNG YURCHAK  02/27/2024  1:35 PM  Description:   Inpatient Psychiatric Referral  Patient was recommended inpatient per Elveria Batter (NP). There are no available beds at [facility], per [facility] Carlo RN). Patient was referred to the following out of network facilities:   Destination  Service Provider Address Phone Fax  [facility]  [address] [place] [place] [address] [phone]  [facility]  [address] [place] [address] [phone]  [facility]  [address] [place], [place] [address] [phone]  [facility] Patient Placement  [facility], [place] [phone]  [facility]  [address] [place] [address] [phone]  [facility]  [address] [place] [address] [phone]  [facility]  [address] [place] [address] [phone]  [facility] EFAX  [address] [place], [place] [phone]  [facility]  [address] [place] [address] [phone]  [facility]  [address] [place] [address] [phone]  [facility]  [address] [place], [place] [address] [phone]  [facility]  [address] [place] [address] [phone]  [facility]  [address] [place] [address] [phone]  [facility]  [address] [place] [address] [phone]  [facility]  [address] [place] [address] [phone]   [facility]  [address], [place] [address] [phone]  [facility]  [address], [place] [address] [phone]      Situation ongoing, CSW to continue following and update chart as more information becomes available.     [place] Nyara Capell, MSW, LCSW  02/27/2024 1:35 PM

## 2024-02-27 NOTE — ED Provider Notes (Signed)
 Emergency Medicine Observation Re-evaluation Note  Jimmy Fisher is a 43 y.o. male, seen on rounds today.  Pt initially presented to the ED for complaints of Suicide Attempt Currently, the patient is walking around the room, washing hands.   Physical Exam  BP 121/73 (BP Location: Left Arm)   Pulse (!) 56   Temp (!) 97.5 F (36.4 C) (Oral)   Resp 18   Ht 5' 6 (1.676 m)   Wt 90.7 kg   SpO2 96%   BMI 32.28 kg/m  Physical Exam General: NAD, alert Lungs: No respiratory distress. Psych: Calm, cooperative   ED Course / MDM  EKG:EKG Interpretation Date/Time:  Saturday February 25 2024 23:46:19 EDT Ventricular Rate:  126 PR Interval:  138 QRS Duration:  94 QT Interval:  324 QTC Calculation: 469 R Axis:   -32  Text Interpretation: Sinus tachycardia Left axis deviation Incomplete right bundle branch block Septal infarct , age undetermined Confirmed by Franklyn Gills [phone]) on 02/26/2024 5:15:25 AM  I have reviewed the labs performed to date as well as medications administered while in observation.  Recent changes in the last 24 hours include:  Patient has been IVC and BH team recommended inpatient.   Plan  Current plan is for waiting for inpatient psychiatry.     Gennaro Duwaine CROME, DO 02/27/24 [phone]

## 2024-02-27 NOTE — ED Notes (Signed)
 Pt report to given to Danita, Charity fundraiser at [facility].

## 2024-02-29 ENCOUNTER — Other Ambulatory Visit: Payer: Self-pay | Admitting: Infectious Diseases

## 2024-02-29 ENCOUNTER — Other Ambulatory Visit (HOSPITAL_COMMUNITY): Payer: Self-pay

## 2024-03-01 ENCOUNTER — Other Ambulatory Visit: Payer: Self-pay

## 2024-03-01 ENCOUNTER — Other Ambulatory Visit (HOSPITAL_COMMUNITY): Payer: Self-pay

## 2024-03-01 MED ORDER — ATORVASTATIN CALCIUM 10 MG PO TABS
10.0000 mg | ORAL_TABLET | Freq: Every day | ORAL | 0 refills | Status: DC
  Filled 2024-03-01: qty 30, 30d supply, fill #0

## 2024-03-02 LAB — CULTURE, BLOOD (ROUTINE X 2)
Culture: NO GROWTH
Culture: NO GROWTH
Special Requests: ADEQUATE

## 2024-03-05 ENCOUNTER — Other Ambulatory Visit: Payer: Self-pay

## 2024-03-05 ENCOUNTER — Other Ambulatory Visit (HOSPITAL_COMMUNITY): Payer: Self-pay

## 2024-03-05 ENCOUNTER — Encounter (HOSPITAL_COMMUNITY): Payer: Self-pay

## 2024-03-05 MED ORDER — ATORVASTATIN CALCIUM 10 MG PO TABS
10.0000 mg | ORAL_TABLET | Freq: Every day | ORAL | 0 refills | Status: DC
  Filled 2024-03-05 – 2024-04-03 (×2): qty 14, 14d supply, fill #0

## 2024-03-05 MED ORDER — IBUPROFEN 800 MG PO TABS
800.0000 mg | ORAL_TABLET | Freq: Three times a day (TID) | ORAL | 0 refills | Status: AC
  Filled 2024-03-05 – 2024-03-06 (×3): qty 42, 14d supply, fill #0

## 2024-03-05 MED ORDER — BIKTARVY 50-200-25 MG PO TABS: ORAL_TABLET | ORAL | 0 refills | Status: AC

## 2024-03-05 MED ORDER — CEPHALEXIN 500 MG PO CAPS
500.0000 mg | ORAL_CAPSULE | Freq: Two times a day (BID) | ORAL | 0 refills | Status: AC
  Filled 2024-03-05 – 2024-03-06 (×3): qty 4, 2d supply, fill #0

## 2024-03-05 MED ORDER — GABAPENTIN 100 MG PO CAPS
100.0000 mg | ORAL_CAPSULE | Freq: Three times a day (TID) | ORAL | 0 refills | Status: AC
  Filled 2024-03-05 – 2024-03-06 (×3): qty 42, 14d supply, fill #0

## 2024-03-05 MED ORDER — METOPROLOL SUCCINATE ER 25 MG PO TB24
25.0000 mg | ORAL_TABLET | Freq: Two times a day (BID) | ORAL | 0 refills | Status: DC
  Filled 2024-03-05 – 2024-03-06 (×3): qty 28, 14d supply, fill #0

## 2024-03-05 MED ORDER — SILVER SULFADIAZINE 1 % EX CREA
1.0000 | TOPICAL_CREAM | Freq: Two times a day (BID) | CUTANEOUS | 0 refills | Status: AC
  Filled 2024-03-05: qty 1400, 14d supply, fill #0
  Filled 2024-03-06: qty 250, 30d supply, fill #0
  Filled 2024-03-06: qty 50, 25d supply, fill #0
  Filled 2024-03-06: qty 400, 30d supply, fill #0
  Filled 2024-03-30: qty 400, 30d supply, fill #1
  Filled 2024-04-29: qty 400, 30d supply, fill #2
  Filled 2024-06-03: qty 200, 30d supply, fill #3

## 2024-03-05 MED ORDER — OLANZAPINE 5 MG PO TABS
5.0000 mg | ORAL_TABLET | Freq: Two times a day (BID) | ORAL | 0 refills | Status: AC
  Filled 2024-03-05: qty 28, 14d supply, fill #0

## 2024-03-05 MED ORDER — FLUOXETINE HCL 40 MG PO CAPS
40.0000 mg | ORAL_CAPSULE | Freq: Every day | ORAL | 0 refills | Status: DC
  Filled 2024-03-05 – 2024-04-03 (×2): qty 14, 14d supply, fill #0

## 2024-03-05 MED ORDER — OMEPRAZOLE 20 MG PO CPDR
20.0000 mg | DELAYED_RELEASE_CAPSULE | Freq: Every day | ORAL | 0 refills | Status: DC
  Filled 2024-03-05 – 2024-03-06 (×3): qty 14, 14d supply, fill #0

## 2024-03-05 MED ORDER — APIXABAN 5 MG PO TABS
ORAL_TABLET | ORAL | 0 refills | Status: AC
  Filled 2024-03-05 – 2024-04-03 (×2): qty 56, 21d supply, fill #0

## 2024-03-06 ENCOUNTER — Other Ambulatory Visit (HOSPITAL_COMMUNITY): Payer: Self-pay

## 2024-03-06 ENCOUNTER — Other Ambulatory Visit: Payer: Self-pay

## 2024-03-06 ENCOUNTER — Telehealth: Payer: Self-pay | Admitting: Gastroenterology

## 2024-03-06 NOTE — Telephone Encounter (Signed)
 Inbound call from patient scheduled for a procedure tomorrow. Patient states he has blood clots in his legs as well as his arm. Please advise.

## 2024-03-06 NOTE — Telephone Encounter (Signed)
 Called pt to discuss MD recommendations. He reports he had already called back and rescheduled since he had not heard back yet. Rescheduled for 05/08/24. Instructed pt that a nurse will be contacting him regarding holding Eliquis  and new prep instructions. Pt verbalized understanding.   Pod A- pt will need clearance for Eliquis  and new instructions, please.

## 2024-03-06 NOTE — Telephone Encounter (Addendum)
 Sent message for MD for advise. Awaiting response for how to proceed.   Addendum, pt started on Eliquis , not Xarelto.

## 2024-03-07 ENCOUNTER — Other Ambulatory Visit: Payer: Self-pay

## 2024-03-07 ENCOUNTER — Encounter: Admitting: Gastroenterology

## 2024-03-12 ENCOUNTER — Other Ambulatory Visit: Payer: Self-pay

## 2024-03-12 NOTE — Progress Notes (Signed)
 Specialty Pharmacy Refill Coordination Note  Jimmy Fisher is a 43 y.o. male contacted today regarding refills of specialty medication(s) Bictegravir-Emtricitab-Tenofov (Biktarvy )   Patient requested Delivery   Delivery date: 03/14/24   Verified address: [address], [place] [address]   Medication will be filled on 03/13/24.

## 2024-03-12 NOTE — Progress Notes (Signed)
 Specialty Pharmacy Ongoing Clinical Assessment Note  Jimmy Fisher is a 43 y.o. male who is being followed by the specialty pharmacy service for RxSp HIV   Patient's specialty medication(s) reviewed today: Bictegravir-Emtricitab-Tenofov (Biktarvy )   Missed doses in the last 4 weeks: 0   Patient/Caregiver did not have any additional questions or concerns.   Therapeutic benefit summary: Patient is achieving benefit   Adverse events/side effects summary: No adverse events/side effects   Patient's therapy is appropriate to: Continue    Goals Addressed             This Visit's Progress    Achieve Undetectable HIV Viral Load < 20   Improving    Patient is not on track and improving. Patient will work on increased adherence. Patient's viral load down to 50 copies/mL as of 12/27/23      Comply with lab assessments   On track    Patient is on track. Patient will maintain adherence      Increase CD4 count until steady state   Improving    Patient is not on track and no change. Patient will work on increased adherence      Maintain optimal adherence to therapy   Improving    Patient is not on track and improving. Patient will work on increased adherence. Patient reported no missed doses this past month.         Follow up: 3 months  [facility]

## 2024-03-13 ENCOUNTER — Other Ambulatory Visit (HOSPITAL_COMMUNITY): Payer: Self-pay

## 2024-03-15 ENCOUNTER — Other Ambulatory Visit (HOSPITAL_COMMUNITY): Payer: Self-pay

## 2024-03-30 ENCOUNTER — Ambulatory Visit: Payer: Self-pay | Admitting: Student

## 2024-03-30 ENCOUNTER — Other Ambulatory Visit: Payer: Self-pay

## 2024-03-30 NOTE — Progress Notes (Deleted)
 CC: ***  HPI: [name] is a 43 y.o. male living with a history stated below and presents today for ***. Please see problem based assessment and plan for additional details.  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 takes with 400mg  ibuprofen      apixaban  (ELIQUIS ) 5 MG TABS tablet Take 2 tablets (10 mg total) by mouth 2 (two) times daily for 7 days, THEN 1 tablet (5 mg total) 2 (two) times daily thereafter. 56 tablet 0   atorvastatin  (LIPITOR) 10 MG tablet Take 1 tablet (10 mg total) by mouth daily for cholesterol 14 tablet 0   bictegravir-emtricitabine -tenofovir  AF (BIKTARVY ) 50-200-25 MG TABS tablet Take 1 tablet by mouth daily. 30 tablet 11   bictegravir-emtricitabine -tenofovir  AF (BIKTARVY ) 50-200-25 MG TABS tablet Take 1 tab By Mouth  DAILY for HIV 14 tablet 0   cephALEXin  (KEFLEX ) 500 MG capsule Take 1 capsule (500 mg total) by mouth 2 (two) times daily for skin changes 4 capsule 0   FLUoxetine  (PROZAC ) 40 MG capsule Take 1 capsule (40 mg total) by mouth daily. for Major depressive disorder, single episode, unspecified 14 capsule 0   folic acid  (FOLVITE ) 1 MG tablet Take 1 mg by mouth daily.     gabapentin  (NEURONTIN ) 100 MG capsule Take 1 capsule (100 mg total) by mouth 3 (three)  times daily for pain 7-10 42 capsule 0   ibuprofen  (ADVIL ) 200 MG tablet Take 400 mg by mouth 2 (two) times daily as needed for headache. takes with 1000mg  acetaminophen      ibuprofen  (IBU) 800 MG tablet Take 1 tablet (800 mg total) by mouth 3 (three) times daily for pain 7-10 42 tablet 0   metoprolol  succinate (TOPROL -XL) 25 MG 24 hr tablet Take 1 tablet (25 mg total) by mouth 2 (two) times daily. for Cardiac Function, Impaired 28 tablet 0   Multiple Vitamin (MULTIVITAMIN) tablet Take 1 tablet by mouth daily.     OLANZapine  (ZYPREXA ) 5 MG tablet Take 1 tablet (5 mg total) by mouth 2 (two) times daily. for mood 28 tablet 0   olmesartan  (BENICAR ) 20 MG tablet Take 20 mg by mouth daily.     omeprazole  (PRILOSEC) 20 MG capsule Take 1 capsule (20 mg total) by mouth 2 (two) times daily. (Patient taking differently: Take 20 mg by mouth daily.) 60 capsule 0   omeprazole  (PRILOSEC) 20 MG capsule Take 1 capsule (20 mg total) by mouth daily  for GERD 14 capsule 0   silver  sulfADIAZINE  (SSD) 1 % cream Apply 50 g topically 2 (two) times daily for skin irritation 1400 g 0   sucralfate  (CARAFATE ) 1 g tablet Take 1 tablet (1 g total) by mouth 3 (three) times daily  with meals. (Patient taking differently: Take 1 g by mouth 3 (three) times daily as needed (stomach pain, reflux).) 90 tablet 0   thiamine  (VITAMIN B1) 100 MG tablet Take 100 mg by mouth daily.     No current facility-administered medications on file prior to visit.    Family History  Problem Relation Age of Onset   Fibromyalgia Mother    Cancer Father     Social History   Socioeconomic History   Marital status: Divorced    Spouse name: Not on file   Number of children: 2   Years of education: Not on file   Highest education level: Not on file  Occupational History   Not on file  Tobacco Use   Smoking status: Every Day    Types: E-cigarettes    Passive exposure: Past   Smokeless tobacco: Former    Types: Snuff  Vaping Use   Vaping  status: Every Day   Substances: Nicotine   Substance and Sexual Activity   Alcohol use: Not Currently    Alcohol/week: 30.0 standard drinks of alcohol    Types: 30 Standard drinks or equivalent per week    Comment: 1/5 a day reported by patient   Drug use: Not Currently    Types: Cocaine, Methamphetamines, Marijuana   Sexual activity: Yes  Other Topics Concern   Not on file  Social History Narrative   Right Handed    Has two sons    Lives in a one story home    Social Drivers of Health   Financial Resource Strain: Not on file  Food Insecurity: Low Risk  (03/04/2024)   Received from [facility]   Food Insecurity    Within the past 12 months, did the food you bought just not last and you didn't have money to get more?: No    Within the past 12 months, did you worry that your food would run out before you got money to buy more?: No  Recent Concern: Food Insecurity - Food Insecurity Present (12/16/2023)   Hunger Vital Sign    Worried About Running Out of Food in the Last Year: Sometimes true    Ran Out of Food in the Last Year: Sometimes true  Transportation Needs: Low Risk  (03/04/2024)   Received from [facility]   Transportation Needs    Within the past 12 months, has a lack of transportation kept you from medical appointments or from doing things needed for daily living?: No  Physical Activity: Not on file  Stress: Not on file  Social Connections: Unknown (11/18/2023)   Social Connection and Isolation Panel    Frequency of Communication with Friends and Family: Not on file    Frequency of Social Gatherings with Friends and Family: Not on file    Attends Religious Services: Not on file    Active Member of Clubs or Organizations: Not on file    Attends Banker Meetings: Not on file    Marital Status: Divorced  Intimate Partner Violence: Not At Risk (12/16/2023)   Humiliation, Afraid, Rape, and Kick questionnaire    Fear of Current or  Ex-Partner: No    Emotionally Abused: No    Physically Abused: No    Sexually Abused: No    Review of Systems: ROS negative except for what is noted on the assessment and plan.  There were no vitals filed for this visit.  Physical Exam  Physical Exam: Constitutional: well-appearing *** sitting in ***,  in no acute distress HENT: normocephalic atraumatic, mucous membranes moist Eyes: conjunctiva non-erythematous Neck: supple Cardiovascular: regular rate and rhythm, no m/r/g Pulmonary/Chest: normal work of breathing on room air, lungs clear to auscultation bilaterally Abdominal: soft, non-tender, non-distended MSK: *** Neurological: alert & oriented x 3, 5/5 strength in bilateral upper and lower extremities, normal gait Skin: warm and dry Psych: ***  Assessment & Plan:   Assessment & Plan   No orders of the defined types were placed in this encounter.  PMH: HTN, GERD, OUD, transaminitis, substance use disorder, HIV on Biktarvy   Hospital follow-up Admission to [facility] 9/20 - 9/21 Acute right lower extremity DVT with prior history of DVT/PE not on anticoagulation.  Discharged on Eliquis .  Discharge back to psychiatric facility [facility].  EKG last seen 9/13 with sinus tachycardia left axis deviation, incomplete RBBB which was noted from prior EKGs example 01/2019  Care: Flu, colonoscopy versus Cologuard  No follow-ups on file.   Patient {GC/GE:[id number]::discussed with,seen with} Dr. {[id number]:[id number]::Tpoopjfd,Z. Hoffman,Winfrey,Narendra,Chun,Chambliss,Lau,Machen}  Ozell Nearing, D.O. [facility] Internal Medicine, PGY-3 Phone: [phone] Date 03/30/2024 Time 7:21 AM

## 2024-04-03 ENCOUNTER — Other Ambulatory Visit (HOSPITAL_COMMUNITY): Payer: Self-pay

## 2024-04-03 ENCOUNTER — Other Ambulatory Visit: Payer: Self-pay

## 2024-04-03 ENCOUNTER — Other Ambulatory Visit: Payer: Self-pay | Admitting: Infectious Diseases

## 2024-04-03 ENCOUNTER — Encounter: Payer: Self-pay | Admitting: Infectious Disease

## 2024-04-03 MED ORDER — METOPROLOL SUCCINATE ER 25 MG PO TB24
25.0000 mg | ORAL_TABLET | Freq: Two times a day (BID) | ORAL | 0 refills | Status: DC
  Filled 2024-04-03: qty 28, 14d supply, fill #0

## 2024-04-04 ENCOUNTER — Other Ambulatory Visit: Payer: Self-pay

## 2024-04-05 ENCOUNTER — Other Ambulatory Visit (HOSPITAL_COMMUNITY): Payer: Self-pay

## 2024-04-09 ENCOUNTER — Other Ambulatory Visit: Payer: Self-pay

## 2024-04-09 NOTE — Progress Notes (Signed)
 Specialty Pharmacy Refill Coordination Note  Jimmy Fisher is a 43 y.o. male contacted today regarding refills of specialty medication(s) Bictegravir-Emtricitab-Tenofov (Biktarvy )   Patient requested Delivery   Delivery date: 04/11/24   Verified address: [address]  [place] [address]   Medication will be filled on: 04/10/24

## 2024-04-11 ENCOUNTER — Other Ambulatory Visit (HOSPITAL_COMMUNITY): Payer: Self-pay

## 2024-04-12 ENCOUNTER — Other Ambulatory Visit (HOSPITAL_COMMUNITY): Payer: Self-pay

## 2024-04-14 ENCOUNTER — Other Ambulatory Visit (HOSPITAL_COMMUNITY): Payer: Self-pay

## 2024-04-14 ENCOUNTER — Other Ambulatory Visit: Payer: Self-pay | Admitting: Infectious Diseases

## 2024-04-16 ENCOUNTER — Other Ambulatory Visit (HOSPITAL_COMMUNITY): Payer: Self-pay

## 2024-04-16 ENCOUNTER — Other Ambulatory Visit: Payer: Self-pay

## 2024-04-16 MED ORDER — FLUOXETINE HCL 40 MG PO CAPS
40.0000 mg | ORAL_CAPSULE | Freq: Every day | ORAL | 0 refills | Status: DC
  Filled 2024-04-16: qty 14, 14d supply, fill #0

## 2024-04-16 MED ORDER — OMEPRAZOLE 20 MG PO CPDR
20.0000 mg | DELAYED_RELEASE_CAPSULE | Freq: Every day | ORAL | 0 refills | Status: DC
  Filled 2024-04-16: qty 14, 14d supply, fill #0

## 2024-04-16 NOTE — Telephone Encounter (Signed)
 Both medications have recently been prescribed by providers that are not with [facility].

## 2024-04-17 ENCOUNTER — Ambulatory Visit: Payer: Self-pay | Admitting: Infectious Disease

## 2024-04-18 ENCOUNTER — Other Ambulatory Visit (HOSPITAL_COMMUNITY): Payer: Self-pay

## 2024-04-19 ENCOUNTER — Ambulatory Visit: Admitting: Internal Medicine

## 2024-04-25 ENCOUNTER — Other Ambulatory Visit (HOSPITAL_COMMUNITY): Payer: Self-pay

## 2024-04-25 ENCOUNTER — Other Ambulatory Visit: Payer: Self-pay | Admitting: Infectious Diseases

## 2024-04-25 MED ORDER — FLUOXETINE HCL 40 MG PO CAPS
40.0000 mg | ORAL_CAPSULE | Freq: Every day | ORAL | 0 refills | Status: AC
  Filled 2024-05-22: qty 14, 14d supply, fill #0

## 2024-05-03 ENCOUNTER — Other Ambulatory Visit: Payer: Self-pay

## 2024-05-07 ENCOUNTER — Telehealth: Payer: Self-pay | Admitting: Gastroenterology

## 2024-05-07 NOTE — Telephone Encounter (Signed)
 Good afternoon Dr. Stacia,   Patient called and stated he will not be in for procedures today at 2:00 pm. States he is currently out of town. Did not reschedule at this time. Please advise.   Thank you

## 2024-05-08 ENCOUNTER — Encounter: Admitting: Gastroenterology

## 2024-05-08 ENCOUNTER — Other Ambulatory Visit: Payer: Self-pay

## 2024-05-09 ENCOUNTER — Other Ambulatory Visit: Payer: Self-pay

## 2024-05-09 ENCOUNTER — Other Ambulatory Visit (HOSPITAL_COMMUNITY): Payer: Self-pay

## 2024-05-11 ENCOUNTER — Other Ambulatory Visit: Payer: Self-pay

## 2024-05-22 ENCOUNTER — Other Ambulatory Visit (HOSPITAL_COMMUNITY): Payer: Self-pay

## 2024-05-22 ENCOUNTER — Other Ambulatory Visit: Payer: Self-pay | Admitting: Infectious Diseases

## 2024-05-22 ENCOUNTER — Other Ambulatory Visit: Payer: Self-pay

## 2024-05-23 ENCOUNTER — Other Ambulatory Visit: Payer: Self-pay

## 2024-05-23 ENCOUNTER — Telehealth: Payer: Self-pay | Admitting: *Deleted

## 2024-05-23 ENCOUNTER — Other Ambulatory Visit (HOSPITAL_BASED_OUTPATIENT_CLINIC_OR_DEPARTMENT_OTHER): Payer: Self-pay

## 2024-05-23 MED ORDER — OMEPRAZOLE 20 MG PO CPDR
20.0000 mg | DELAYED_RELEASE_CAPSULE | Freq: Every day | ORAL | 0 refills | Status: DC
  Filled 2024-05-23: qty 14, 14d supply, fill #0

## 2024-05-23 NOTE — Telephone Encounter (Signed)
 Copied from CRM [id number]. Topic: Clinical - Prescription Issue >> May 22, 2024  4:51 PM Graeme ORN wrote: Reason for CRM: Patient called to request refill for omeprazole  (PRILOSEC) 20 MG capsule. States he called last month and never received it. Let him know shows sent to pharmacy 11/3. Confirmed pharmacy correct. Show new request pending in chart from pharmacy today. Would like refills. States he has been taking for years. Thank You

## 2024-05-28 ENCOUNTER — Other Ambulatory Visit: Payer: Self-pay

## 2024-05-28 NOTE — Progress Notes (Signed)
 Specialty Pharmacy Ongoing Clinical Assessment Note  Jimmy Fisher is a 43 y.o. male who is being followed by the specialty pharmacy service for RxSp HIV   Patient's specialty medication(s) reviewed today: Bictegravir-Emtricitab-Tenofov (Biktarvy )   Missed doses in the last 4 weeks: 2   Patient/Caregiver did not have any additional questions or concerns.   Therapeutic benefit summary: Patient is achieving benefit   Adverse events/side effects summary: No adverse events/side effects   Patient's therapy is appropriate to: Continue    Goals Addressed             This Visit's Progress    Achieve Undetectable HIV Viral Load < 20   Improving    Patient is not on track and improving. Patient will work on increased adherence. Patient's viral load down to 50 copies/mL as of 12/27/23.       Comply with lab assessments   No change    Patient is not on track and no change. Patient will maintain adherence. Patient was a no show at last appointment and has not rescheduled.      Increase CD4 count until steady state   No change    Patient is not on track and no change. Patient will work on increased adherence      Maintain optimal adherence to therapy   Improving    Patient is not on track and improving. Patient will work on increased adherence. Patient reported no missed doses this past month.         Follow up: 3 months  [facility]

## 2024-05-28 NOTE — Progress Notes (Signed)
 Specialty Pharmacy Refill Coordination Note  BAKER MORONTA is a 43 y.o. male contacted today regarding refills of specialty medication(s) Bictegravir-Emtricitab-Tenofov (Biktarvy )   Patient requested Delivery   Delivery date: 05/30/24   Verified address: [address]  [place] [address]   Medication will be filled on: 05/29/24

## 2024-05-29 ENCOUNTER — Other Ambulatory Visit: Payer: Self-pay

## 2024-05-30 ENCOUNTER — Other Ambulatory Visit: Payer: Self-pay | Admitting: Infectious Diseases

## 2024-05-30 MED ORDER — FLUOXETINE HCL 40 MG PO CAPS
40.0000 mg | ORAL_CAPSULE | Freq: Every day | ORAL | 0 refills | Status: AC
  Filled 2024-07-04: qty 14, 14d supply, fill #0

## 2024-05-31 ENCOUNTER — Other Ambulatory Visit (HOSPITAL_COMMUNITY): Payer: Self-pay

## 2024-06-04 ENCOUNTER — Other Ambulatory Visit: Payer: Self-pay

## 2024-06-04 ENCOUNTER — Other Ambulatory Visit (HOSPITAL_COMMUNITY): Payer: Self-pay

## 2024-06-04 MED ORDER — CLINDAMYCIN HCL 300 MG PO CAPS
300.0000 mg | ORAL_CAPSULE | Freq: Four times a day (QID) | ORAL | 0 refills | Status: DC
  Filled 2024-06-04: qty 12, 3d supply, fill #0

## 2024-06-04 MED ORDER — OMEPRAZOLE 20 MG PO CPDR
20.0000 mg | DELAYED_RELEASE_CAPSULE | Freq: Every day | ORAL | 0 refills | Status: AC
  Filled 2024-06-04: qty 30, 30d supply, fill #0

## 2024-06-04 MED ORDER — FLUOXETINE HCL 40 MG PO CAPS
40.0000 mg | ORAL_CAPSULE | Freq: Every day | ORAL | 0 refills | Status: AC
  Filled 2024-06-04: qty 30, 30d supply, fill #0

## 2024-06-04 MED ORDER — NICOTINE POLACRILEX 2 MG MT GUM
CHEWING_GUM | OROMUCOSAL | 0 refills | Status: AC
  Filled 2024-06-04: qty 110, 30d supply, fill #0

## 2024-06-04 MED ORDER — ELIQUIS 5 MG PO TABS
5.0000 mg | ORAL_TABLET | Freq: Two times a day (BID) | ORAL | 0 refills | Status: AC
  Filled 2024-06-04: qty 60, 30d supply, fill #0

## 2024-06-04 MED ORDER — ATORVASTATIN CALCIUM 10 MG PO TABS
10.0000 mg | ORAL_TABLET | Freq: Every day | ORAL | 0 refills | Status: AC
  Filled 2024-06-04: qty 30, 30d supply, fill #0

## 2024-06-05 ENCOUNTER — Other Ambulatory Visit (HOSPITAL_COMMUNITY): Payer: Self-pay

## 2024-06-05 ENCOUNTER — Other Ambulatory Visit: Payer: Self-pay

## 2024-06-06 ENCOUNTER — Other Ambulatory Visit (HOSPITAL_COMMUNITY): Payer: Self-pay

## 2024-06-06 MED ORDER — CLINDAMYCIN HCL 300 MG PO CAPS
300.0000 mg | ORAL_CAPSULE | Freq: Four times a day (QID) | ORAL | 0 refills | Status: AC
  Filled 2024-06-06: qty 8, 2d supply, fill #0

## 2024-06-18 ENCOUNTER — Telehealth: Payer: Self-pay | Admitting: *Deleted

## 2024-06-18 ENCOUNTER — Other Ambulatory Visit: Payer: Self-pay | Admitting: Infectious Diseases

## 2024-06-18 MED ORDER — HYDROCORT-PRAMOXINE (PERIANAL) 1-1 % EX FOAM: 1.0000 | Freq: Two times a day (BID) | CUTANEOUS | 1 refills | Status: AC

## 2024-06-18 NOTE — Telephone Encounter (Signed)
 Call from patient checking on the status of hemorrhoid cream.    Rx never received by pharmacy  Will have MD resend.

## 2024-06-18 NOTE — Addendum Note (Signed)
 Addended by: FREDDI THEOPLIS BROCKS on: 06/18/2024 04:05 PM   Modules accepted: Orders

## 2024-06-18 NOTE — Telephone Encounter (Signed)
 Called pt and let him know that I called [facility] and let them know he is ok for adm with hemmorhoids.  I sent in (electronically) a rx for his hemmheroids.  He believes he will be admitted tomorrow.

## 2024-06-18 NOTE — Telephone Encounter (Signed)
 Will forward to Dr. Eben.                   Copied from CRM [id number]. Topic: Clinical - Medical Advice >> Jun 18, 2024 11:53 AM Rosaria A wrote: Reason for CRM: Patient states that he is trying to get into a [facility] called [facility] ([facility] and what is holding him up is he was constipated and has hemorrhoids and went to wipe today and there was blood. Sherry from [place] states that she needs information sent to her that he will be okay to come and patient is wanting to see if his PCP can send him in a prescription for his hemidrosis to [facility]  [address] [place], [place] [address] [phone]  Joen phone number is [phone] Email address: [email or web address]  Please call patient back to discuss.

## 2024-06-21 ENCOUNTER — Other Ambulatory Visit: Payer: Self-pay

## 2024-06-22 ENCOUNTER — Encounter: Payer: Self-pay | Admitting: Infectious Diseases

## 2024-06-25 ENCOUNTER — Other Ambulatory Visit: Payer: Self-pay

## 2024-06-25 ENCOUNTER — Telehealth: Payer: Self-pay | Admitting: *Deleted

## 2024-06-25 ENCOUNTER — Other Ambulatory Visit (HOSPITAL_COMMUNITY): Payer: Self-pay

## 2024-06-25 ENCOUNTER — Ambulatory Visit: Payer: Self-pay

## 2024-06-25 NOTE — Progress Notes (Signed)
 Specialty Pharmacy Refill Coordination Note  Jimmy Fisher is a 44 y.o. male contacted today regarding refills of specialty medication(s) Bictegravir-Emtricitab-Tenofov (Biktarvy )   Patient requested Delivery   Delivery date: 06/26/24   Verified address: [address]  [place] [address]   Medication will be filled on: 06/25/24

## 2024-06-25 NOTE — Telephone Encounter (Signed)
 FYI Only or Action Required?: FYI only for provider: ED advised.  Patient was last seen in primary care on 12/30/2023 by Renne Homans, MD.  Called Nurse Triage reporting Rectal Bleeding and Wound Infection.  Symptoms began a week ago.  Interventions attempted: OTC medications: laxatives, avoided BM to rest bowel.  Symptoms are: gradually worsening.  Triage Disposition: Go to ED Now (Notify PCP)  Patient/caregiver understands and will follow disposition?: Yes      Copied from CRM #[id number]. Topic: Clinical - Red Word Triage >> Jun 25, 2024  3:24 PM Marda MATSU wrote: Red Word that prompted transfer to Nurse Triage: rectal bleeding, yellow pus, constipation,  frequent urination, difficulty urinating,  Cloudy. hx of rectal prolapse Reason for Disposition  Large mass protruding out of rectum  Answer Assessment - Initial Assessment Questions This RN recommended pt be examined in hospital asap. Pt verbalized understanding, will head to ER via loved one. Advised call 911 if any new or worsening symptoms.    Symptoms: Rectal prolapse 3-4 days ago after straining so hard - did not get examined Severe constipation Yellow pus and blood from rectum for 1-2 weeks Hemorrhoid Feels like blockage or infection Bowel urgency but nothing comes out Urinary urgency but nothing comes out   Denies: Rectal injury  Protocols used: Rectal Symptoms-A-AH

## 2024-06-25 NOTE — Telephone Encounter (Unsigned)
 Copied from CRM [id number]. Topic: Clinical - Medical Advice >> Jun 18, 2024 11:53 AM Rosaria A wrote: Reason for CRM: Patient states that he is trying to get into a [facility] called [facility] ([facility] and what is holding him up is he was constipated and has hemorrhoids and went to wipe today and there was blood. Sherry from [place] states that she needs information sent to her that he will be okay to come and patient is wanting to see if his PCP can send him in a prescription for his hemidrosis to [facility]  [address] [place], [place] [address] [phone]  Joen phone number is [phone] Email address: [email or web address]  Please call patient back to discuss. >> Jun 25, 2024  2:31 PM Graeme ORN wrote: Patient called back. States program reach out to him and states the information that received was not sufficient. Need something more formal like a letter for clearance on letter head stating wound not infected and he is clear to be admitted to program. Thank You  >> Jun 18, 2024  3:54 PM Rosaria A wrote: Patient called back stating that he called the Pharmacy and the medication is not at the Pharmacy. Patient is wanting to make sure it is at [facility]. Called [facility], [place] took over the call.

## 2024-06-26 ENCOUNTER — Ambulatory Visit: Payer: Self-pay

## 2024-06-26 ENCOUNTER — Encounter: Payer: Self-pay | Admitting: Infectious Diseases

## 2024-06-26 NOTE — Telephone Encounter (Signed)
 Copied from CRM [id number]. Topic: Clinical - Medical Advice >> Jun 18, 2024 11:53 AM Rosaria A wrote: Reason for CRM: Patient states that he is trying to get into a [facility] called [facility] and what is holding him up is he was constipated and has hemorrhoids and went to wipe today and there was blood. Sherry from [place] states that she needs information sent to her that he will be okay to come and patient is wanting to see if his PCP can send him in a prescription for his hemidrosis to [facility]  [address] [place], [place] [address] [phone]  Joen phone number is [phone] Email address: [email or web address]  Please call patient back to discuss. >> Jun 26, 2024  4:38 PM Chiquita SQUIBB wrote: Patient is calling in again regarding this letter requesting it to be sent as soon as possible, please update the patient.  >> Jun 25, 2024  2:31 PM Graeme ORN wrote: Patient called back. States program reach out to him and states the information that received was not sufficient. Need something more formal like a letter for clearance on letter head stating wound not infected and he is clear to be admitted to program. Thank You  >> Jun 18, 2024  3:54 PM Rosaria A wrote: Patient called back stating that he called the Pharmacy and the medication is not at the Pharmacy. Patient is wanting to make sure it is at [facility]. Called [facility], [place] took over the call.

## 2024-06-26 NOTE — Telephone Encounter (Signed)
 FYI Only or Action Required?: Action required by provider: request for appointment and refused ED.  Patient was last seen in primary care on 12/30/2023 by Renne Homans, MD.  Called Nurse Triage reporting Rectal Pain.  Symptoms began a week ago.  Interventions attempted: Nothing.  Symptoms are: unchanged.  Triage Disposition: Go to ED Now (Notify PCP)  Patient/caregiver understands and will follow disposition?: No                               Patient called in again today to report large protrusion from rectum. Patient is also experiencing rectal pain, discharge and bleeding. Patient stated he has been constipated and doing a large amount of straining. Patient stated he believes he is experiencing a prolapse. Patient was advised ED yesterday and did not go. This RN advised ED at this time. Patient declined and is seeking evaluation in office. Please advise patient at [phone].  Reason for Disposition  Large mass protruding out of rectum  Protocols used: Rectal Symptoms-A-AH  Copied from CRM #[id number]. Topic: Clinical - Red Word Triage >> Jun 26, 2024  4:35 PM Chiquita SQUIBB wrote: Red Word that prompted transfer to Nurse Triage: Patient is calling in stating that he is having pus and blood coming out of his rectal as well as burning and pain.

## 2024-06-26 NOTE — Telephone Encounter (Signed)
 This RN called [facility] to notify of ED refusal. No answer.

## 2024-06-27 ENCOUNTER — Other Ambulatory Visit: Payer: Self-pay | Admitting: Infectious Diseases

## 2024-06-27 ENCOUNTER — Encounter (HOSPITAL_COMMUNITY): Payer: Self-pay

## 2024-06-27 ENCOUNTER — Emergency Department (HOSPITAL_COMMUNITY): Payer: MEDICAID

## 2024-06-27 ENCOUNTER — Other Ambulatory Visit: Payer: Self-pay

## 2024-06-27 ENCOUNTER — Emergency Department (HOSPITAL_COMMUNITY)
Admission: EM | Admit: 2024-06-27 | Discharge: 2024-06-27 | Disposition: A | Discharge status: Home / Self Care | Payer: MEDICAID | Source: Ambulatory Visit | Attending: Emergency Medicine | Admitting: Emergency Medicine

## 2024-06-27 ENCOUNTER — Encounter: Payer: Self-pay | Admitting: *Deleted

## 2024-06-27 ENCOUNTER — Encounter (INDEPENDENT_AMBULATORY_CARE_PROVIDER_SITE_OTHER): Payer: Self-pay

## 2024-06-27 DIAGNOSIS — Z21 Asymptomatic human immunodeficiency virus [HIV] infection status: Secondary | ICD-10-CM | POA: Insufficient documentation

## 2024-06-27 DIAGNOSIS — I10 Essential (primary) hypertension: Secondary | ICD-10-CM | POA: Insufficient documentation

## 2024-06-27 DIAGNOSIS — K623 Rectal prolapse: Secondary | ICD-10-CM | POA: Insufficient documentation

## 2024-06-27 DIAGNOSIS — Z79899 Other long term (current) drug therapy: Secondary | ICD-10-CM | POA: Diagnosis not present

## 2024-06-27 DIAGNOSIS — Z7901 Long term (current) use of anticoagulants: Secondary | ICD-10-CM | POA: Insufficient documentation

## 2024-06-27 DIAGNOSIS — K6289 Other specified diseases of anus and rectum: Secondary | ICD-10-CM | POA: Diagnosis not present

## 2024-06-27 LAB — COMPREHENSIVE METABOLIC PANEL WITH GFR
ALT: 18 U/L (ref 0–44)
AST: 22 U/L (ref 15–41)
Albumin: 4.1 g/dL (ref 3.5–5.0)
Alkaline Phosphatase: 87 U/L (ref 38–126)
Anion gap: 11 (ref 5–15)
BUN: 11 mg/dL (ref 6–20)
CO2: 24 mmol/L (ref 22–32)
Calcium: 9.3 mg/dL (ref 8.9–10.3)
Chloride: 102 mmol/L (ref 98–111)
Creatinine, Ser: 1.06 mg/dL (ref 0.61–1.24)
GFR, Estimated: >60 mL/min
Glucose, Bld: 84 mg/dL (ref 70–99)
Potassium: 4.1 mmol/L (ref 3.5–5.1)
Sodium: 137 mmol/L (ref 135–145)
Total Bilirubin: 0.4 mg/dL (ref 0.0–1.2)
Total Protein: 6.9 g/dL (ref 6.5–8.1)

## 2024-06-27 LAB — URINALYSIS, ROUTINE W REFLEX MICROSCOPIC
Bacteria, UA: NONE SEEN
Bilirubin Urine: NEGATIVE
Glucose, UA: NEGATIVE mg/dL
Hgb urine dipstick: NEGATIVE
Ketones, ur: NEGATIVE mg/dL
Leukocytes,Ua: NEGATIVE
Nitrite: NEGATIVE
Protein, ur: NEGATIVE mg/dL
Specific Gravity, Urine: 1.058 — ABNORMAL HIGH (ref 1.005–1.030)
pH: 6 (ref 5.0–8.0)

## 2024-06-27 LAB — ETHANOL: Alcohol, Ethyl (B): <15 mg/dL

## 2024-06-27 LAB — URINE DRUG SCREEN
Amphetamines: POSITIVE — AB
Barbiturates: NEGATIVE
Benzodiazepines: NEGATIVE
Cocaine: NEGATIVE
Fentanyl: NEGATIVE
Methadone Scn, Ur: NEGATIVE
Opiates: NEGATIVE
Tetrahydrocannabinol: NEGATIVE

## 2024-06-27 LAB — CBC WITH DIFFERENTIAL/PLATELET
Abs Immature Granulocytes: 0.03 K/uL (ref 0.00–0.07)
Basophils Absolute: 0.1 K/uL (ref 0.0–0.1)
Basophils Relative: 1 %
Eosinophils Absolute: 0.2 K/uL (ref 0.0–0.5)
Eosinophils Relative: 3 %
HCT: 43.5 % (ref 39.0–52.0)
Hemoglobin: 14.4 g/dL (ref 13.0–17.0)
Immature Granulocytes: 0 %
Lymphocytes Relative: 37 %
Lymphs Abs: 2.7 K/uL (ref 0.7–4.0)
MCH: 29.4 pg (ref 26.0–34.0)
MCHC: 33.1 g/dL (ref 30.0–36.0)
MCV: 88.8 fL (ref 80.0–100.0)
Monocytes Absolute: 0.8 K/uL (ref 0.1–1.0)
Monocytes Relative: 11 %
Neutro Abs: 3.5 K/uL (ref 1.7–7.7)
Neutrophils Relative %: 48 %
Platelets: 361 K/uL (ref 150–400)
RBC: 4.9 MIL/uL (ref 4.22–5.81)
RDW: 13 % (ref 11.5–15.5)
WBC: 7.3 K/uL (ref 4.0–10.5)
nRBC: 0 % (ref 0.0–0.2)

## 2024-06-27 LAB — I-STAT CG4 LACTIC ACID, ED: Lactic Acid, Venous: 1.2 mmol/L (ref 0.5–1.9)

## 2024-06-27 MED ORDER — IOHEXOL 350 MG/ML SOLN
75.0000 mL | Freq: Once | INTRAVENOUS | Status: AC | PRN
  Administered 2024-06-27: 75 mL via INTRAVENOUS

## 2024-06-27 MED ORDER — OXYCODONE-ACETAMINOPHEN 5-325 MG PO TABS: 1.0000 | ORAL_TABLET | Freq: Four times a day (QID) | ORAL | 0 refills | Status: AC | PRN

## 2024-06-27 MED ORDER — POLYETHYLENE GLYCOL 3350 17 G PO PACK: 17.0000 g | PACK | Freq: Every day | ORAL | 0 refills | Status: AC

## 2024-06-27 NOTE — ED Triage Notes (Signed)
 Patient has rectal prolapse with purulent drainage.  Patient is an IVDU and has track marks to left arm with redness.

## 2024-06-27 NOTE — ED Provider Notes (Signed)
 " [place] EMERGENCY DEPARTMENT AT [facility] Provider Note   CSN: [id number] Arrival date & time: 06/27/24  1409     Patient presents with: rectal prolapse   Jimmy Fisher is a 44 y.o. male.   The history is provided by the patient and medical records.  Illness Location:  Rectal pain and concern for rectal prolapse Severity:  Severe Onset quality:  Gradual Duration:  5 days Timing:  Intermittent Progression:  Waxing and waning Chronicity:  New Associated symptoms: no abdominal pain, no chest pain, no congestion, no cough, no diarrhea, no fatigue, no fever, no headaches, no nausea, no rhinorrhea, no vomiting and no wheezing        Prior to Admission medications  Medication Sig Start Date End Date Taking? Authorizing Provider  acetaminophen  (TYLENOL ) 500 MG tablet Take 1,000 mg by mouth 2 (two) times daily as needed for headache. takes with 400mg  ibuprofen     [provider]  apixaban  (ELIQUIS ) 5 MG TABS tablet Take 2 tablets (10 mg total) by mouth 2 (two) times daily for 7 days, THEN 1 tablet (5 mg total) 2 (two) times daily thereafter. 03/05/24 04/25/24    apixaban  (ELIQUIS ) 5 MG TABS tablet Take 1 tablet (5 mg total) by mouth 2 (two) times daily. 06/04/24     atorvastatin  (LIPITOR) 10 MG tablet Take 1 tablet (10 mg total) by mouth at bedtime. 06/04/24     bictegravir-emtricitabine -tenofovir  AF (BIKTARVY ) 50-200-25 MG TABS tablet Take 1 tablet by mouth daily. 12/27/23   Fleeta Kathie Jomarie LOISE, MD  bictegravir-emtricitabine -tenofovir  AF (BIKTARVY ) 50-200-25 MG TABS tablet Take 1 tab By Mouth  DAILY for HIV 03/05/24     cephALEXin  (KEFLEX ) 500 MG capsule Take 1 capsule (500 mg total) by mouth 2 (two) times daily for skin changes 03/05/24     clindamycin  (CLEOCIN ) 300 MG capsule Take 1 capsule (300 mg total) by mouth 4 (four) times daily FOR 2 DAYS. 06/06/24     FLUoxetine  (PROZAC ) 40 MG capsule Take 1 capsule (40 mg total) by mouth daily. 05/30/24   Eben Reyes BROCKS, MD  FLUoxetine  (PROZAC ) 40 MG capsule Take 1 capsule (40 mg total) by mouth daily. 06/04/24     folic acid  (FOLVITE ) 1 MG tablet Take 1 mg by mouth daily.    [provider]  gabapentin  (NEURONTIN ) 100 MG capsule Take 1 capsule (100 mg total) by mouth 3 (three) times daily for pain 7-10 03/05/24     hydrocortisone -pramoxine (PROCTOFOAM-HC) rectal foam Place 1 applicator rectally 2 (two) times daily. 06/18/24   Eben Reyes BROCKS, MD  ibuprofen  (ADVIL ) 200 MG tablet Take 400 mg by mouth 2 (two) times daily as needed for headache. takes with 1000mg  acetaminophen     [provider]  ibuprofen  (IBU) 800 MG tablet Take 1 tablet (800 mg total) by mouth 3 (three) times daily for pain 7-10 03/05/24     metoprolol  succinate (TOPROL -XL) 25 MG 24 hr tablet Take 1 tablet (25 mg total) by mouth 2 (two) times daily. for Cardiac Function, Impaired 04/03/24   Shawn Sick, MD  Multiple Vitamin (MULTIVITAMIN) tablet Take 1 tablet by mouth daily.    [provider]  nicotine  polacrilex (NICORETTE ) 2 MG gum Chew 1 each (2 mg total) every hour as needed for smoking cessation 06/04/24     OLANZapine  (ZYPREXA ) 5 MG tablet Take 1 tablet (5 mg total) by mouth 2 (two) times daily. for mood 03/05/24     olmesartan  (BENICAR ) 20 MG tablet  Take 20 mg by mouth daily.    [provider]  omeprazole  (PRILOSEC) 20 MG capsule Take 1 capsule (20 mg total) by mouth 2 (two) times daily. Patient taking differently: Take 20 mg by mouth daily. 12/20/23   Lynnette Barter, MD  omeprazole  (PRILOSEC) 20 MG capsule Take 1 capsule (20 mg total) by mouth daily. 06/04/24     silver  sulfADIAZINE  (SSD) 1 % cream Apply 50 g topically 2 (two) times daily for skin irritation 03/05/24     sucralfate  (CARAFATE ) 1 g tablet Take 1 tablet (1 g total) by mouth 3 (three) times daily with meals. Patient taking differently: Take 1 g by mouth 3 (three) times daily as needed (stomach pain, reflux). 02/20/24   Eben Reyes BROCKS,  MD  thiamine  (VITAMIN B1) 100 MG tablet Take 100 mg by mouth daily.    [provider]    Allergies: Vibramycin  [doxycycline ]    Review of Systems  Constitutional:  Negative for chills, fatigue and fever.  HENT:  Negative for congestion and rhinorrhea.   Respiratory:  Negative for cough and wheezing.   Cardiovascular:  Negative for chest pain.  Gastrointestinal:  Positive for anal bleeding and constipation. Negative for abdominal distention, abdominal pain, blood in stool, diarrhea, nausea and vomiting.  Genitourinary:  Positive for difficulty urinating. Negative for dysuria and frequency.  Musculoskeletal:  Negative for back pain, neck pain and neck stiffness.  Neurological:  Negative for dizziness, weakness, light-headedness and headaches.  Psychiatric/Behavioral:  Negative for agitation.   All other systems reviewed and are negative.   Updated Vital Signs BP (!) 127/93 (BP Location: Left Arm)   Pulse 89   Temp 98.1 F (36.7 C) (Oral)   Resp 18   Ht 5' 6 (1.676 m)   Wt 90.7 kg   SpO2 98%   BMI 32.27 kg/m   Physical Exam Vitals and nursing note reviewed. Exam conducted with a chaperone present.  Constitutional:      General: He is not in acute distress.    Appearance: He is well-developed.  HENT:     Head: Normocephalic and atraumatic.     Nose: No congestion or rhinorrhea.  Eyes:     Conjunctiva/sclera: Conjunctivae normal.  Cardiovascular:     Rate and Rhythm: Normal rate and regular rhythm.     Heart sounds: No murmur heard. Pulmonary:     Effort: Pulmonary effort is normal. No respiratory distress.     Breath sounds: Normal breath sounds.  Abdominal:     Palpations: Abdomen is soft.     Tenderness: There is no abdominal tenderness. There is no right CVA tenderness, left CVA tenderness, guarding or rebound.  Genitourinary:    Comments: Rectal tenderness present but no prolapse.  No clear thrombosed hemorrhoids. Musculoskeletal:        General:  Tenderness present. No swelling.     Cervical back: Neck supple. No tenderness.  Skin:    General: Skin is warm and dry.     Capillary Refill: Capillary refill takes less than 2 seconds.     Findings: No erythema or rash.  Neurological:     Mental Status: He is alert.  Psychiatric:        Mood and Affect: Mood normal.     (all labs ordered are listed, but only abnormal results are displayed) Labs Reviewed  URINALYSIS, ROUTINE W REFLEX MICROSCOPIC - Abnormal; Notable for the following components:      Result Value   Specific Gravity, Urine 1.058 (*)  All other components within normal limits  URINE DRUG SCREEN - Abnormal; Notable for the following components:   Amphetamines POSITIVE (*)    All other components within normal limits  CULTURE, BLOOD (ROUTINE X 2)  CULTURE, BLOOD (ROUTINE X 2)  COMPREHENSIVE METABOLIC PANEL WITH GFR  CBC WITH DIFFERENTIAL/PLATELET  ETHANOL  I-STAT CG4 LACTIC ACID, ED  I-STAT CG4 LACTIC ACID, ED    EKG: None  Radiology: CT ABDOMEN PELVIS W CONTRAST Result Date: 06/27/2024 EXAM: CT ABDOMEN AND PELVIS WITH CONTRAST 06/27/2024 05:31:23 PM TECHNIQUE: CT of the abdomen and pelvis was performed with the administration of 75 mL of iohexol  (OMNIPAQUE ) 350 MG/ML injection. Multiplanar reformatted images are provided for review. Automated exposure control, iterative reconstruction, and/or weight-based adjustment of the mA/kV was utilized to reduce the radiation dose to as low as reasonably achievable. COMPARISON: None available. CLINICAL HISTORY: Rectal pain, potential fistula. FINDINGS: LOWER CHEST: No acute abnormality. LIVER: The liver is unremarkable. GALLBLADDER AND BILE DUCTS: Gallbladder is unremarkable. No biliary ductal dilatation. SPLEEN: No acute abnormality. PANCREAS: No acute abnormality. ADRENAL GLANDS: No acute abnormality. KIDNEYS, URETERS AND BLADDER: No stones in the kidneys or ureters. No hydronephrosis. No perinephric or periureteral  stranding. Urinary bladder is unremarkable. GI AND BOWEL: Stomach demonstrates no acute abnormality. No small or large bowel thickening or dilatation. The appendix is unremarkable. Few scattered colonic diverticula. There is no bowel obstruction. PERITONEUM AND RETROPERITONEUM: No ascites. No free air. VASCULATURE: Aorta is normal in caliber. LYMPH NODES: No lymphadenopathy. REPRODUCTIVE ORGANS: Unremarkable prostate. BONES AND SOFT TISSUES: No acute osseous abnormality. No soft tissue edema or nasopharynx fistula tract. IMPRESSION: 1. No acute abdominopelvic findings to explain rectal pain or suggest fistula. Electronically signed by: Morgane Naveau MD 06/27/2024 05:36 PM EST RP Workstation: [id number]     Procedures   Medications Ordered in the ED  iohexol  (OMNIPAQUE ) 350 MG/ML injection 75 mL (75 mLs Intravenous Contrast Given 06/27/24 1732)                                    Medical Decision Making Risk OTC drugs. Prescription drug management.    Jimmy Fisher is a 44 y.o. 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  According to patient, for the last 4 to 5 days he has had more constipation and started having rectal pain and a bulge that is concerning for prolapse.  He called his PCP and send him a picture of it and they reportedly put in a referral for outpatient general surgery consultation for this however he reports that it Bulging out especially with bowel movements and we did not go back in earlier today.  He presented for evaluation and pain control.  He otherwise is denying significant abdominal pain but was worried there was some drainage or mucousy discharge coming from his bottom.  Otherwise denies any dysuria or hematuria.  Denies any trauma.  Denies history of this before.  He reports he started taking medicine from them and  it seemed to improve until it worsened today.  On exam with a chaperone, lungs clear.  Chest nontender.  Abdomen nontender.  Good bowel sounds.  Flanks and back nontender.  A rectal exam was performed and patient does not have evidence of rectal prolapse at this time.  He did show me a picture  from earlier today and it clearly looks like a prolapse but it appears to have reduced.  Patient does say that he has been able to reduce it on and off.  He allowed me to do a DRE and he had some tenderness but not feel large hemorrhoids or anything like that at this time.  Patient had a CT scan and labs in triage that did not show fistula, abscess, or large abnormality in the rectum.  I spoke to general surgery who recommended follow-up with the colorectal team for further management given his recurrent problem.  We also give prescription for MiraLAX  and patient did request some pain medicine and we discussed the risks of worsening constipation with pain medicine but due to the discomfort he was having we felt this was reasonable to let him help manage his pain as an outpatient as well.  Patient we discharged home to follow-up with general surgery, pick up his MiraLAX , and follow-up with his primary team.  It was not prolapsed on my last evaluation and patient feels safe with discharge home.  Patient discharged for outpatient follow-up with understanding of return precautions.     Final diagnoses:  Rectal prolapse  Rectal pain    ED Discharge Orders          Ordered    polyethylene glycol (MIRALAX ) 17 g packet  Daily        06/27/24 2056    oxyCODONE -acetaminophen  (PERCOCET/ROXICET) 5-325 MG tablet  Every 6 hours PRN        06/27/24 2056            Clinical Impression: 1. Rectal prolapse   2. Rectal pain     Disposition: Discharge  Condition: Good  I have discussed the results, Dx and Tx plan with the pt(& family if present). He/she/they expressed understanding and agree(s) with  the plan. Discharge instructions discussed at great length. Strict return precautions discussed and pt &/or family have verbalized understanding of the instructions. No further questions at time of discharge.    Discharge Medication List as of 06/27/2024  9:10 PM     START taking these medications   Details  oxyCODONE -acetaminophen  (PERCOCET/ROXICET) 5-325 MG tablet Take 1 tablet by mouth every 6 (six) hours as needed for severe pain (pain score 7-10)., Starting Wed 06/27/2024, Normal    polyethylene glycol (MIRALAX ) 17 g packet Take 17 g by mouth daily., Starting Wed 06/27/2024, Normal        Follow Up: Surgery, [facility] [address] [place] [address] [phone]         Sahily Biddle, Lonni PARAS, MD 06/27/24 [phone]  "

## 2024-06-27 NOTE — Telephone Encounter (Signed)
 A user error has taken place: encounter opened in error, closed for administrative reasons.

## 2024-06-27 NOTE — ED Provider Triage Note (Signed)
 Emergency Medicine Provider Triage Evaluation Note  INES WARF , a 44 y.o. male  was evaluated in triage.  Pt complains of rectal pain for 4 days.  States he believes he has rectal prolapse.  Notes passing blood and purulence from rectum.  States there is constipation and severe straining.  He is passing gas.  Denies fevers.  Review of Systems  Positive: Abdominal pain Negative: Fevers, nausea/vomiting  Physical Exam  BP (!) 129/93   Pulse 97   Temp 97.6 F (36.4 C)   Resp 18   SpO2 99%  Gen:   Awake, no distress   Resp:  Normal effort  MSK:   Moves extremities without difficulty  Other:  Uncomfortable in chair  Medical Decision Making  Medically screening exam initiated at 2:29 PM.  Appropriate orders placed.  [name] Sous was informed that the remainder of the evaluation will be completed by another provider, this initial triage assessment does not replace that evaluation, and the importance of remaining in the ED until their evaluation is complete.     Neysa Thersia RAMAN, [place] 06/27/24 1432

## 2024-06-27 NOTE — Discharge Instructions (Addendum)
 Your history, exam, workup today are consistent with intermittent rectal prolapse likely due to the constipation you have been having.  Your CT scan did not show any concerning findings and when we assessed you the prolapse had already reduced.  There was nothing to reduce at this time.  Please use the MiraLAX  and increase your hydration to help prevent further constipation and use your other medication provided by her PCP.  Due to the severe pain please consider using some pain medicine if needed however this may cause constipation so be careful with it.  Please call the general surgery team to speak to the colorectal surgeons to discuss outpatient follow-up given the recurrent nature of this.  If any symptoms recur, change, or worsen acutely and it cannot be reduced, please return to the nearest Emergency Department.

## 2024-06-27 NOTE — ED Triage Notes (Signed)
 Pt. Stated, I have rectal prolapse for 4-5 days.

## 2024-06-28 ENCOUNTER — Telehealth: Payer: Self-pay | Admitting: *Deleted

## 2024-06-28 ENCOUNTER — Other Ambulatory Visit (HOSPITAL_COMMUNITY): Payer: Self-pay

## 2024-06-28 NOTE — Telephone Encounter (Signed)
 Biktarvy  was filled on 06/25/24 and delivered on 06/26/24. Tracking number: [id number]

## 2024-06-28 NOTE — Telephone Encounter (Signed)
 Copied from CRM [id number]. Topic: Clinical - Medical Advice >> Jun 18, 2024 11:53 AM Rosaria A wrote: Reason for CRM: Patient states that he is trying to get into a [facility] and what is holding him up is he was constipated and has hemorrhoids and went to wipe today and there was blood. 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 [place], [place] [address] [phone]  Joen phone number is [phone] Email address: [email or web address]  Please call patient back to discuss. >> Jun 28, 2024 12:27 PM DeAngela L wrote: Patient returning calling about medical clearance paper work please email the clearance paper work to the email address below   Patient num [phone]  Email address  [email or web address]  >> Jun 26, 2024  4:38 PM Chiquita SQUIBB wrote: Patient is calling in again regarding this letter requesting it to be sent as soon as possible, please update the patient.  >> Jun 25, 2024  2:31 PM Graeme ORN wrote: Patient called back. States program reach out to him and states the information that received was not sufficient. Need something more formal like a letter for clearance on letter head stating wound not infected and he is clear to be admitted to program. Thank You  >> Jun 18, 2024  3:54 PM Rosaria A wrote: Patient called back stating that he called the Pharmacy and the medication is not at the Pharmacy. Patient is wanting to make sure it is at [facility]. Called [facility], [place] took over the call.

## 2024-06-28 NOTE — Telephone Encounter (Signed)
 Return pt's call - informed pt we received the letter from Dr Eben and it faxed to [facility]. Informed pt we do not send letters via email addresses.

## 2024-06-29 ENCOUNTER — Encounter: Payer: Self-pay | Admitting: Infectious Diseases

## 2024-06-29 ENCOUNTER — Other Ambulatory Visit (HOSPITAL_COMMUNITY): Payer: Self-pay

## 2024-07-02 ENCOUNTER — Telehealth: Payer: Self-pay | Admitting: Infectious Diseases

## 2024-07-02 ENCOUNTER — Other Ambulatory Visit (HOSPITAL_COMMUNITY): Payer: Self-pay

## 2024-07-02 LAB — CULTURE, BLOOD (ROUTINE X 2)
Culture: NO GROWTH
Special Requests: ADEQUATE

## 2024-07-02 NOTE — Telephone Encounter (Signed)
 Message has been forwarded to Dr. Eben and his the triage nurse for follow up.      Copied from CRM [id number]. Topic: General - Other >> Jun 29, 2024 11:59 AM Debby BROCKS wrote: Reason for CRM: Patient states that the [facility] is wanting a letter stating that the prolapse issue he had is not going to prohibit him from going to the program.  Patient did not have the fax # but the information below: [facility] Phone # : [phone] [facility] Address: [address] [place], [place] [address]

## 2024-07-03 ENCOUNTER — Telehealth: Payer: Self-pay | Admitting: *Deleted

## 2024-07-03 ENCOUNTER — Other Ambulatory Visit (HOSPITAL_COMMUNITY): Payer: Self-pay

## 2024-07-03 ENCOUNTER — Telehealth: Payer: Self-pay | Admitting: Infectious Diseases

## 2024-07-03 NOTE — Telephone Encounter (Signed)
 Letter faxed to [facility]

## 2024-07-03 NOTE — Telephone Encounter (Unsigned)
 Copied from CRM [id number]. Topic: General - Other >> Jun 29, 2024 11:59 AM Debby BROCKS wrote: Reason for CRM: Patient states that the [facility]) is wanting a letter stating that the prolapse issue he had is not going to prohibit him from going to the program.  Patient did not have the fax # but the information below: [facility] Phone # : [phone] [facility] Address: [address], [place], [place] [address] >> Jul 03, 2024 11:07 AM Graeme ORN wrote: Patient called back. States he decided to go a different route because he kept getting the run around. Is now trying to get into [facility]. Needs letter stating that his mental health is stable enough to be placed. Fax to:

## 2024-07-03 NOTE — Telephone Encounter (Signed)
 Letter faxed to [facility] [phone] & Alternate Fax Number [phone]

## 2024-07-04 ENCOUNTER — Other Ambulatory Visit: Payer: Self-pay

## 2024-07-04 ENCOUNTER — Other Ambulatory Visit (HOSPITAL_COMMUNITY): Payer: Self-pay

## 2024-07-04 MED ORDER — METOPROLOL SUCCINATE ER 25 MG PO TB24
25.0000 mg | ORAL_TABLET | Freq: Two times a day (BID) | ORAL | 0 refills | Status: AC
  Filled 2024-07-04: qty 28, 14d supply, fill #0

## 2024-07-04 NOTE — Telephone Encounter (Signed)
 Medication sent to pharmacy

## 2024-07-04 NOTE — Telephone Encounter (Signed)
 Chilon stated letter has been faxed.

## 2024-07-05 NOTE — Telephone Encounter (Signed)
Thanks Chilon! 

## 2024-07-06 ENCOUNTER — Other Ambulatory Visit (HOSPITAL_COMMUNITY): Payer: Self-pay

## 2024-07-07 ENCOUNTER — Other Ambulatory Visit (HOSPITAL_COMMUNITY): Payer: Self-pay

## 2024-07-13 ENCOUNTER — Other Ambulatory Visit (HOSPITAL_COMMUNITY): Payer: Self-pay

## 2024-07-13 ENCOUNTER — Other Ambulatory Visit: Payer: Self-pay

## 2024-07-13 NOTE — Progress Notes (Signed)
 Specialty Pharmacy Refill Coordination Note  Jimmy Fisher is a 44 y.o. male contacted today regarding refills of specialty medication(s) Bictegravir-Emtricitab-Tenofov (Biktarvy )   Patient requested Delivery   Delivery date: 07/19/24   Verified address: [facility] - [address], [place] [address]   Medication will be filled on: 07/18/24

## 2024-07-18 ENCOUNTER — Other Ambulatory Visit: Payer: Self-pay

## 2024-07-19 ENCOUNTER — Other Ambulatory Visit (HOSPITAL_COMMUNITY): Payer: Self-pay
